# Patient Record
Sex: Female | Born: 1940 | Race: White | Hispanic: No | Smoking: Never smoker
Health system: Southern US, Community
[De-identification: ages and names within clinical notes are randomized; demographics above are authoritative.]

## PROBLEM LIST (undated history)

## (undated) DIAGNOSIS — I319 Disease of pericardium, unspecified: Secondary | ICD-10-CM

## (undated) DIAGNOSIS — I5042 Chronic combined systolic (congestive) and diastolic (congestive) heart failure: Secondary | ICD-10-CM

## (undated) DIAGNOSIS — R079 Chest pain, unspecified: Secondary | ICD-10-CM

## (undated) DIAGNOSIS — E785 Hyperlipidemia, unspecified: Secondary | ICD-10-CM

## (undated) DIAGNOSIS — I2510 Atherosclerotic heart disease of native coronary artery without angina pectoris: Secondary | ICD-10-CM

## (undated) DIAGNOSIS — I219 Acute myocardial infarction, unspecified: Secondary | ICD-10-CM

## (undated) DIAGNOSIS — E119 Type 2 diabetes mellitus without complications: Secondary | ICD-10-CM

## (undated) DIAGNOSIS — K589 Irritable bowel syndrome without diarrhea: Secondary | ICD-10-CM

## (undated) HISTORY — PX: CORONARY ANGIOPLASTY WITH STENT PLACEMENT: SHX49

## (undated) HISTORY — DX: Hyperlipidemia, unspecified: E78.5

## (undated) HISTORY — DX: Chronic combined systolic (congestive) and diastolic (congestive) heart failure: I50.42

## (undated) HISTORY — DX: Irritable bowel syndrome, unspecified: K58.9

## (undated) HISTORY — DX: Type 2 diabetes mellitus without complications: E11.9

---

## 2001-06-09 ENCOUNTER — Encounter: Admission: RE | Admit: 2001-06-09 | Discharge: 2001-08-11 | Payer: Self-pay | Admitting: Family Medicine

## 2002-05-01 ENCOUNTER — Emergency Department (HOSPITAL_COMMUNITY): Admission: EM | Admit: 2002-05-01 | Discharge: 2002-05-01 | Payer: Self-pay | Admitting: Emergency Medicine

## 2002-05-01 ENCOUNTER — Encounter: Payer: Self-pay | Admitting: *Deleted

## 2002-06-01 ENCOUNTER — Ambulatory Visit (HOSPITAL_COMMUNITY): Admission: RE | Admit: 2002-06-01 | Discharge: 2002-06-01 | Payer: Self-pay | Admitting: Family Medicine

## 2002-06-01 ENCOUNTER — Encounter: Payer: Self-pay | Admitting: Family Medicine

## 2002-12-30 ENCOUNTER — Encounter: Payer: Self-pay | Admitting: Family Medicine

## 2002-12-30 ENCOUNTER — Ambulatory Visit (HOSPITAL_COMMUNITY): Admission: RE | Admit: 2002-12-30 | Discharge: 2002-12-30 | Payer: Self-pay | Admitting: Family Medicine

## 2003-05-05 ENCOUNTER — Encounter: Admission: RE | Admit: 2003-05-05 | Discharge: 2003-08-03 | Payer: Self-pay | Admitting: Orthopaedic Surgery

## 2003-08-04 ENCOUNTER — Encounter: Admission: RE | Admit: 2003-08-04 | Discharge: 2003-09-30 | Payer: Self-pay | Admitting: Orthopaedic Surgery

## 2004-11-30 ENCOUNTER — Emergency Department (HOSPITAL_COMMUNITY): Admission: EM | Admit: 2004-11-30 | Discharge: 2004-12-01 | Payer: Self-pay | Admitting: *Deleted

## 2004-12-31 ENCOUNTER — Ambulatory Visit (HOSPITAL_COMMUNITY): Admission: RE | Admit: 2004-12-31 | Discharge: 2004-12-31 | Payer: Self-pay | Admitting: Family Medicine

## 2005-04-24 ENCOUNTER — Emergency Department (HOSPITAL_COMMUNITY): Admission: EM | Admit: 2005-04-24 | Discharge: 2005-04-25 | Payer: Self-pay | Admitting: *Deleted

## 2005-07-09 ENCOUNTER — Ambulatory Visit (HOSPITAL_COMMUNITY): Admission: RE | Admit: 2005-07-09 | Discharge: 2005-07-09 | Payer: Self-pay | Admitting: Family Medicine

## 2005-11-12 ENCOUNTER — Ambulatory Visit (HOSPITAL_COMMUNITY): Admission: RE | Admit: 2005-11-12 | Discharge: 2005-11-12 | Payer: Self-pay | Admitting: Family Medicine

## 2006-05-15 ENCOUNTER — Ambulatory Visit (HOSPITAL_COMMUNITY): Admission: RE | Admit: 2006-05-15 | Discharge: 2006-05-15 | Payer: Self-pay | Admitting: Family Medicine

## 2006-09-03 ENCOUNTER — Ambulatory Visit (HOSPITAL_COMMUNITY): Admission: RE | Admit: 2006-09-03 | Discharge: 2006-09-03 | Payer: Self-pay | Admitting: Family Medicine

## 2008-12-14 ENCOUNTER — Ambulatory Visit (HOSPITAL_COMMUNITY): Admission: RE | Admit: 2008-12-14 | Discharge: 2008-12-14 | Payer: Self-pay | Admitting: Family Medicine

## 2010-12-02 ENCOUNTER — Encounter: Payer: Self-pay | Admitting: Family Medicine

## 2012-01-09 DIAGNOSIS — E119 Type 2 diabetes mellitus without complications: Secondary | ICD-10-CM | POA: Diagnosis not present

## 2012-01-09 DIAGNOSIS — N390 Urinary tract infection, site not specified: Secondary | ICD-10-CM | POA: Diagnosis not present

## 2012-01-09 DIAGNOSIS — M545 Low back pain: Secondary | ICD-10-CM | POA: Diagnosis not present

## 2012-03-25 DIAGNOSIS — Z85828 Personal history of other malignant neoplasm of skin: Secondary | ICD-10-CM | POA: Diagnosis not present

## 2012-03-25 DIAGNOSIS — D239 Other benign neoplasm of skin, unspecified: Secondary | ICD-10-CM | POA: Diagnosis not present

## 2012-03-25 DIAGNOSIS — C44319 Basal cell carcinoma of skin of other parts of face: Secondary | ICD-10-CM | POA: Diagnosis not present

## 2012-03-25 DIAGNOSIS — L570 Actinic keratosis: Secondary | ICD-10-CM | POA: Diagnosis not present

## 2012-04-20 DIAGNOSIS — C44319 Basal cell carcinoma of skin of other parts of face: Secondary | ICD-10-CM | POA: Diagnosis not present

## 2012-05-06 DIAGNOSIS — I10 Essential (primary) hypertension: Secondary | ICD-10-CM | POA: Diagnosis not present

## 2012-05-06 DIAGNOSIS — E785 Hyperlipidemia, unspecified: Secondary | ICD-10-CM | POA: Diagnosis not present

## 2012-05-06 DIAGNOSIS — E119 Type 2 diabetes mellitus without complications: Secondary | ICD-10-CM | POA: Diagnosis not present

## 2012-05-18 DIAGNOSIS — Z1231 Encounter for screening mammogram for malignant neoplasm of breast: Secondary | ICD-10-CM | POA: Diagnosis not present

## 2012-06-03 DIAGNOSIS — A088 Other specified intestinal infections: Secondary | ICD-10-CM | POA: Diagnosis not present

## 2012-08-21 DIAGNOSIS — Z79899 Other long term (current) drug therapy: Secondary | ICD-10-CM | POA: Diagnosis not present

## 2012-08-21 DIAGNOSIS — E782 Mixed hyperlipidemia: Secondary | ICD-10-CM | POA: Diagnosis not present

## 2012-08-24 DIAGNOSIS — E785 Hyperlipidemia, unspecified: Secondary | ICD-10-CM | POA: Diagnosis not present

## 2012-08-24 DIAGNOSIS — E119 Type 2 diabetes mellitus without complications: Secondary | ICD-10-CM | POA: Diagnosis not present

## 2012-08-24 DIAGNOSIS — I10 Essential (primary) hypertension: Secondary | ICD-10-CM | POA: Diagnosis not present

## 2012-08-24 DIAGNOSIS — Z23 Encounter for immunization: Secondary | ICD-10-CM | POA: Diagnosis not present

## 2012-10-20 DIAGNOSIS — D485 Neoplasm of uncertain behavior of skin: Secondary | ICD-10-CM | POA: Diagnosis not present

## 2012-10-20 DIAGNOSIS — L280 Lichen simplex chronicus: Secondary | ICD-10-CM | POA: Diagnosis not present

## 2012-10-20 DIAGNOSIS — Z85828 Personal history of other malignant neoplasm of skin: Secondary | ICD-10-CM | POA: Diagnosis not present

## 2012-11-24 DIAGNOSIS — Z79899 Other long term (current) drug therapy: Secondary | ICD-10-CM | POA: Diagnosis not present

## 2012-11-24 DIAGNOSIS — E782 Mixed hyperlipidemia: Secondary | ICD-10-CM | POA: Diagnosis not present

## 2012-12-15 DIAGNOSIS — E785 Hyperlipidemia, unspecified: Secondary | ICD-10-CM | POA: Diagnosis not present

## 2012-12-15 DIAGNOSIS — I10 Essential (primary) hypertension: Secondary | ICD-10-CM | POA: Diagnosis not present

## 2012-12-15 DIAGNOSIS — E119 Type 2 diabetes mellitus without complications: Secondary | ICD-10-CM | POA: Diagnosis not present

## 2013-02-04 ENCOUNTER — Telehealth: Payer: Self-pay | Admitting: Family Medicine

## 2013-02-04 ENCOUNTER — Other Ambulatory Visit: Payer: Self-pay

## 2013-02-04 DIAGNOSIS — J329 Chronic sinusitis, unspecified: Secondary | ICD-10-CM

## 2013-02-04 MED ORDER — AMOXICILLIN 500 MG PO TABS: 500.0000 mg | ORAL_TABLET | Freq: Three times a day (TID) | ORAL | Status: DC

## 2013-02-04 NOTE — Telephone Encounter (Signed)
E-prescribed to [facility] drug amox 500 tid for ten days. Patient notified.

## 2013-02-04 NOTE — Telephone Encounter (Signed)
Refill amox 500 tid for ten days

## 2013-02-04 NOTE — Telephone Encounter (Signed)
Pt finished her antibiotic from her last visit, she is still having ear pain, tooth ache left side, sinus pressure, rattling in chest, scratchy throat can she get another round of antibiotic or does she need to come back in?  she

## 2013-02-05 ENCOUNTER — Telehealth: Payer: Self-pay | Admitting: *Deleted

## 2013-02-05 DIAGNOSIS — J329 Chronic sinusitis, unspecified: Secondary | ICD-10-CM

## 2013-02-05 MED ORDER — HYDROCODONE-ACETAMINOPHEN 5-325 MG PO TABS: 1.0000 | ORAL_TABLET | Freq: Four times a day (QID) | ORAL | Status: DC | PRN

## 2013-02-05 MED ORDER — METOPROLOL SUCCINATE ER 200 MG PO TB24: 200.0000 mg | ORAL_TABLET | Freq: Every day | ORAL | Status: DC

## 2013-02-05 NOTE — Telephone Encounter (Signed)
[facility] in [facility]

## 2013-04-08 ENCOUNTER — Encounter: Payer: Self-pay | Admitting: *Deleted

## 2013-04-19 ENCOUNTER — Ambulatory Visit (INDEPENDENT_AMBULATORY_CARE_PROVIDER_SITE_OTHER): Payer: Medicare Other | Admitting: Family Medicine

## 2013-04-19 ENCOUNTER — Encounter: Payer: Self-pay | Admitting: Family Medicine

## 2013-04-19 VITALS — BP 122/70 | HR 70 | Wt 163.0 lb

## 2013-04-19 DIAGNOSIS — I10 Essential (primary) hypertension: Secondary | ICD-10-CM | POA: Diagnosis not present

## 2013-04-19 DIAGNOSIS — E119 Type 2 diabetes mellitus without complications: Secondary | ICD-10-CM | POA: Diagnosis not present

## 2013-04-19 DIAGNOSIS — G8929 Other chronic pain: Secondary | ICD-10-CM | POA: Insufficient documentation

## 2013-04-19 DIAGNOSIS — M25559 Pain in unspecified hip: Secondary | ICD-10-CM | POA: Diagnosis not present

## 2013-04-19 DIAGNOSIS — IMO0001 Reserved for inherently not codable concepts without codable children: Secondary | ICD-10-CM

## 2013-04-19 DIAGNOSIS — E785 Hyperlipidemia, unspecified: Secondary | ICD-10-CM | POA: Diagnosis not present

## 2013-04-19 DIAGNOSIS — E1122 Type 2 diabetes mellitus with diabetic chronic kidney disease: Secondary | ICD-10-CM | POA: Insufficient documentation

## 2013-04-19 LAB — POCT GLYCOSYLATED HEMOGLOBIN (HGB A1C): Hemoglobin A1C: 6.7

## 2013-04-19 NOTE — Progress Notes (Signed)
  Subjective:    Patient ID: Elizabeth Aguilar, female    DOB: Oct 29, 1941, 72 y.o.   MRN: [id number]  Diabetes She presents for her follow-up diabetic visit. She has type 2 diabetes mellitus. Pertinent negatives for diabetes include no chest pain and no fatigue.  Patient denies chest tightness pressure pain shortness breath nausea vomiting diarrhea. Has history diabetes did not tolerate Glucotrol also history of hypertension allergies arthralgias anxiety and hyperlipidemia. Safety measures were reviewed dietary measures reviewed family history reviewed social doesn't smoke   Review of Systems  Constitutional: Negative for fever and fatigue.  HENT: Positive for congestion.   Respiratory: Negative for shortness of breath.   Cardiovascular: Negative for chest pain.  Gastrointestinal: Negative for abdominal pain.       Objective:   Physical Exam  Constitutional: She appears well-developed.  HENT:  Head: Normocephalic.  Right Ear: External ear normal.  Left Ear: External ear normal.  Cardiovascular: Normal rate and regular rhythm.   Pulmonary/Chest: Effort normal and breath sounds normal. She has no wheezes.  Abdominal: Soft.  Lymphadenopathy:    She has no cervical adenopathy.          Assessment & Plan:  #1-Hypertension fair control continue current measures. Diabetes fairly good control continue Glucophage and diet #3 arthralgias Mobic as directed. Anxiety issues Xanax on a when necessary basis #5 hyperlipidemia continue pravastatin check lab work.

## 2013-04-20 DIAGNOSIS — L909 Atrophic disorder of skin, unspecified: Secondary | ICD-10-CM | POA: Diagnosis not present

## 2013-04-20 DIAGNOSIS — D239 Other benign neoplasm of skin, unspecified: Secondary | ICD-10-CM | POA: Diagnosis not present

## 2013-04-20 DIAGNOSIS — L570 Actinic keratosis: Secondary | ICD-10-CM | POA: Diagnosis not present

## 2013-04-20 DIAGNOSIS — D1801 Hemangioma of skin and subcutaneous tissue: Secondary | ICD-10-CM | POA: Diagnosis not present

## 2013-04-20 DIAGNOSIS — D485 Neoplasm of uncertain behavior of skin: Secondary | ICD-10-CM | POA: Diagnosis not present

## 2013-04-20 DIAGNOSIS — D2330 Other benign neoplasm of skin of unspecified part of face: Secondary | ICD-10-CM | POA: Diagnosis not present

## 2013-04-20 DIAGNOSIS — Z85828 Personal history of other malignant neoplasm of skin: Secondary | ICD-10-CM | POA: Diagnosis not present

## 2013-05-05 ENCOUNTER — Encounter: Payer: Self-pay | Admitting: Family Medicine

## 2013-05-05 DIAGNOSIS — E785 Hyperlipidemia, unspecified: Secondary | ICD-10-CM | POA: Diagnosis not present

## 2013-05-05 DIAGNOSIS — E119 Type 2 diabetes mellitus without complications: Secondary | ICD-10-CM | POA: Diagnosis not present

## 2013-05-05 DIAGNOSIS — M25559 Pain in unspecified hip: Secondary | ICD-10-CM | POA: Diagnosis not present

## 2013-05-05 LAB — LIPID PANEL
Cholesterol: 147 mg/dL (ref 0–200)
LDL Cholesterol: 72 mg/dL (ref 0–99)
Total CHOL/HDL Ratio: 3.5 Ratio

## 2013-05-05 LAB — BASIC METABOLIC PANEL
BUN: 13 mg/dL (ref 6–23)
Calcium: 9 mg/dL (ref 8.4–10.5)
Creat: 1.01 mg/dL (ref 0.50–1.10)
Glucose, Bld: 104 mg/dL — ABNORMAL HIGH (ref 70–99)
Potassium: 4 mEq/L (ref 3.5–5.3)
Sodium: 142 mEq/L (ref 135–145)

## 2013-05-05 LAB — MICROALBUMIN, URINE: Microalb, Ur: 0.5 mg/dL (ref 0.00–1.89)

## 2013-05-07 ENCOUNTER — Other Ambulatory Visit: Payer: Self-pay | Admitting: *Deleted

## 2013-05-07 MED ORDER — ALPRAZOLAM 0.5 MG PO TABS: ORAL_TABLET | ORAL | Status: DC

## 2013-06-07 ENCOUNTER — Other Ambulatory Visit: Payer: Self-pay | Admitting: *Deleted

## 2013-06-07 MED ORDER — HYDROCODONE-ACETAMINOPHEN 5-325 MG PO TABS: 1.0000 | ORAL_TABLET | Freq: Four times a day (QID) | ORAL | Status: DC | PRN

## 2013-08-06 ENCOUNTER — Other Ambulatory Visit: Payer: Self-pay | Admitting: *Deleted

## 2013-08-06 MED ORDER — METOPROLOL SUCCINATE ER 200 MG PO TB24: 200.0000 mg | ORAL_TABLET | Freq: Every day | ORAL | Status: DC

## 2013-08-06 MED ORDER — HYDROCHLOROTHIAZIDE 25 MG PO TABS: 12.5000 mg | ORAL_TABLET | Freq: Every day | ORAL | Status: DC

## 2013-08-06 MED ORDER — MELOXICAM 7.5 MG PO TABS: 7.5000 mg | ORAL_TABLET | Freq: Every day | ORAL | Status: DC

## 2013-08-06 MED ORDER — LISINOPRIL 5 MG PO TABS: 5.0000 mg | ORAL_TABLET | Freq: Every day | ORAL | Status: DC

## 2013-08-06 MED ORDER — ESOMEPRAZOLE MAGNESIUM 40 MG PO CPDR: 40.0000 mg | DELAYED_RELEASE_CAPSULE | Freq: Every day | ORAL | Status: DC

## 2013-08-06 MED ORDER — LORATADINE 10 MG PO TABS: 10.0000 mg | ORAL_TABLET | Freq: Every day | ORAL | Status: DC

## 2013-08-06 MED ORDER — METFORMIN HCL 500 MG PO TABS: 500.0000 mg | ORAL_TABLET | Freq: Two times a day (BID) | ORAL | Status: DC

## 2013-08-11 ENCOUNTER — Other Ambulatory Visit: Payer: Self-pay | Admitting: *Deleted

## 2013-08-11 MED ORDER — CYCLOBENZAPRINE HCL 10 MG PO TABS: 10.0000 mg | ORAL_TABLET | Freq: Every evening | ORAL | Status: DC | PRN

## 2013-08-12 ENCOUNTER — Encounter: Payer: Self-pay | Admitting: Family Medicine

## 2013-08-12 ENCOUNTER — Ambulatory Visit (INDEPENDENT_AMBULATORY_CARE_PROVIDER_SITE_OTHER): Payer: Medicare Other | Admitting: Family Medicine

## 2013-08-12 VITALS — BP 108/72 | Ht 65.0 in | Wt 161.0 lb

## 2013-08-12 DIAGNOSIS — N390 Urinary tract infection, site not specified: Secondary | ICD-10-CM | POA: Diagnosis not present

## 2013-08-12 DIAGNOSIS — M549 Dorsalgia, unspecified: Secondary | ICD-10-CM

## 2013-08-12 DIAGNOSIS — E119 Type 2 diabetes mellitus without complications: Secondary | ICD-10-CM | POA: Diagnosis not present

## 2013-08-12 DIAGNOSIS — G8929 Other chronic pain: Secondary | ICD-10-CM

## 2013-08-12 DIAGNOSIS — I10 Essential (primary) hypertension: Secondary | ICD-10-CM

## 2013-08-12 DIAGNOSIS — E785 Hyperlipidemia, unspecified: Secondary | ICD-10-CM

## 2013-08-12 DIAGNOSIS — Z23 Encounter for immunization: Secondary | ICD-10-CM | POA: Diagnosis not present

## 2013-08-12 LAB — POCT URINALYSIS DIPSTICK
Spec Grav, UA: <=1.005
pH, UA: 5

## 2013-08-12 MED ORDER — PRAVASTATIN SODIUM 80 MG PO TABS: 80.0000 mg | ORAL_TABLET | Freq: Every day | ORAL | Status: DC

## 2013-08-12 MED ORDER — ESOMEPRAZOLE MAGNESIUM 40 MG PO CPDR: 40.0000 mg | DELAYED_RELEASE_CAPSULE | Freq: Every day | ORAL | Status: DC

## 2013-08-12 MED ORDER — FLUTICASONE PROPIONATE 50 MCG/ACT NA SUSP: 2.0000 | Freq: Every day | NASAL | Status: DC

## 2013-08-12 MED ORDER — METFORMIN HCL 500 MG PO TABS: 500.0000 mg | ORAL_TABLET | Freq: Two times a day (BID) | ORAL | Status: DC

## 2013-08-12 MED ORDER — HYDROCODONE-ACETAMINOPHEN 5-325 MG PO TABS: 1.0000 | ORAL_TABLET | Freq: Four times a day (QID) | ORAL | Status: DC | PRN

## 2013-08-12 MED ORDER — CYCLOBENZAPRINE HCL 10 MG PO TABS: 10.0000 mg | ORAL_TABLET | Freq: Every evening | ORAL | Status: DC | PRN

## 2013-08-12 MED ORDER — KETOROLAC TROMETHAMINE 0.4 % OP SOLN: 1.0000 [drp] | Freq: Four times a day (QID) | OPHTHALMIC | Status: DC

## 2013-08-12 MED ORDER — ALPRAZOLAM 0.5 MG PO TABS: ORAL_TABLET | ORAL | Status: DC

## 2013-08-12 MED ORDER — HYDROCHLOROTHIAZIDE 25 MG PO TABS: 12.5000 mg | ORAL_TABLET | Freq: Every day | ORAL | Status: DC

## 2013-08-12 NOTE — Addendum Note (Signed)
Addended by: Dereck Ligas on: 08/12/2013 03:40 PM   Modules accepted: Orders

## 2013-08-12 NOTE — Progress Notes (Signed)
  Subjective:    Patient ID: Elizabeth Aguilar, female    DOB: 1941-04-11, 72 y.o.   MRN: [id number]  Diabetes She presents for her follow-up diabetic visit. She has type 2 diabetes mellitus. Her disease course has been stable. There are no hypoglycemic associated symptoms. There are no diabetic associated symptoms. There are no hypoglycemic complications. Symptoms are stable. There are no diabetic complications. There are no known risk factors for coronary artery disease. Current diabetic treatment includes oral agent (monotherapy). She is compliant with treatment all of the time.  Urinary Tract Infection  This is a new problem. The current episode started 1 to 4 weeks ago. The problem occurs intermittently. The problem has been unchanged. The quality of the pain is described as burning. The pain is mild. There has been no fever. She has tried nothing for the symptoms. The treatment provided no relief.   States sugars overall doing fairly well denies any low spells relates compliance with medicine  Has chronic pain in the low back as well as her knees she states she takes hydrocodone anywhere from one to 3 times per day. Most of the time it's only one time a day. She denies abusing it. She keeps her medicine in the same spot. This patient was seen today for chronic pain  The medication list was reviewed and updated.  Discussion was held with the patient regarding compliance with pain medication. The patient was advised the importance of maintaining medication and not using illegal substances with these. The patient was educated that we can provide 3 monthly scripts for their medication, it is their responsibility to follow the instructions. Discussion was held with the patient to make sure they're not having significant side effects. Patient is aware that pain medications are meant to minimize the severity of the pain to allow their pain levels to improve to allow for better function. They are aware of  that pain medications cannot totally remove their pain.   Patient also uses nerve pills denies abusing it. Helps her when she's stressed out. She needs refills on all of her medicines today. Family history reviewed. Social history does not smoke   Review of Systems No excessive thirst having some frequency in urination no vomiting no diarrhea.    Objective:   Physical Exam Lungs are clear heart is regular neck no masses eardrums some wax otherwise okay extremities no edema pulses are slightly diminished somewhat dry feet diabetic foot exam was done bunion on the left side callus on the right side would benefit from diabetic shoes.      Assessment & Plan:

## 2013-08-13 LAB — URINE CULTURE
Colony Count: NO GROWTH
Organism ID, Bacteria: NO GROWTH

## 2013-09-02 ENCOUNTER — Encounter: Payer: Self-pay | Admitting: Family Medicine

## 2013-09-02 ENCOUNTER — Ambulatory Visit (INDEPENDENT_AMBULATORY_CARE_PROVIDER_SITE_OTHER): Payer: Medicare Other | Admitting: Family Medicine

## 2013-09-02 VITALS — BP 112/70 | Temp 98.1°F | Ht 65.0 in | Wt 159.6 lb

## 2013-09-02 DIAGNOSIS — L259 Unspecified contact dermatitis, unspecified cause: Secondary | ICD-10-CM | POA: Diagnosis not present

## 2013-09-02 MED ORDER — MOMETASONE FUROATE 0.1 % EX CREA: TOPICAL_CREAM | CUTANEOUS | Status: DC

## 2013-09-02 NOTE — Progress Notes (Signed)
  Subjective:    Patient ID: Elizabeth Aguilar, female    DOB: 08-Nov-1941, 72 y.o.   MRN: [id number]  Rash This is a new problem. The current episode started in the past 7 days. The affected locations include the right wrist, left arm, right arm and left wrist. The rash is characterized by dryness, itchiness and redness. She was exposed to nothing. Past treatments include nothing.   PMH benign No new meds no new detergents or soaps.   Review of Systems  Skin: Positive for rash.   she denies fever cough runny nose     Objective:   Physical Exam Lungs are clear hearts regular no edema in the legs she has a non-distinct rash in the folds of the elbow plus also the right and left breast your fremitus slight central clearing does not appear to be target lesion. Does not appear to be erythema multiforme. Could be eczema.       Assessment & Plan:  Probable eczema/contact dermatitis steroid cream as directed followup if ongoing troubles keep regular followups

## 2013-09-07 ENCOUNTER — Other Ambulatory Visit: Payer: Self-pay | Admitting: *Deleted

## 2013-09-07 MED ORDER — METOPROLOL SUCCINATE ER 200 MG PO TB24: 200.0000 mg | ORAL_TABLET | Freq: Every day | ORAL | Status: DC

## 2013-09-07 MED ORDER — LISINOPRIL 5 MG PO TABS: 5.0000 mg | ORAL_TABLET | Freq: Every day | ORAL | Status: DC

## 2013-09-07 MED ORDER — LORATADINE 10 MG PO TABS: 10.0000 mg | ORAL_TABLET | Freq: Every day | ORAL | Status: DC

## 2013-09-10 ENCOUNTER — Telehealth: Payer: Self-pay | Admitting: Family Medicine

## 2013-09-10 NOTE — Telephone Encounter (Signed)
Prescription given to Elizabeth Aguilar for Diabetic Shoes.  DX: Type II Diabetic Neuropathy

## 2013-09-15 ENCOUNTER — Other Ambulatory Visit: Payer: Self-pay

## 2013-09-15 MED ORDER — MELOXICAM 7.5 MG PO TABS: 7.5000 mg | ORAL_TABLET | Freq: Every day | ORAL | Status: DC

## 2013-10-04 ENCOUNTER — Other Ambulatory Visit: Payer: Self-pay | Admitting: *Deleted

## 2013-10-04 MED ORDER — MOMETASONE FUROATE 0.1 % EX CREA: TOPICAL_CREAM | CUTANEOUS | Status: DC

## 2013-10-21 DIAGNOSIS — D2330 Other benign neoplasm of skin of unspecified part of face: Secondary | ICD-10-CM | POA: Diagnosis not present

## 2013-10-21 DIAGNOSIS — L259 Unspecified contact dermatitis, unspecified cause: Secondary | ICD-10-CM | POA: Diagnosis not present

## 2013-10-21 DIAGNOSIS — Z85828 Personal history of other malignant neoplasm of skin: Secondary | ICD-10-CM | POA: Diagnosis not present

## 2013-10-21 DIAGNOSIS — L570 Actinic keratosis: Secondary | ICD-10-CM | POA: Diagnosis not present

## 2013-11-16 ENCOUNTER — Ambulatory Visit: Payer: Medicare Other | Admitting: Family Medicine

## 2013-11-17 ENCOUNTER — Ambulatory Visit (INDEPENDENT_AMBULATORY_CARE_PROVIDER_SITE_OTHER): Payer: Medicare Other | Admitting: Family Medicine

## 2013-11-17 ENCOUNTER — Encounter: Payer: Self-pay | Admitting: Family Medicine

## 2013-11-17 VITALS — BP 122/68 | Ht 63.25 in | Wt 158.0 lb

## 2013-11-17 DIAGNOSIS — M549 Dorsalgia, unspecified: Secondary | ICD-10-CM | POA: Diagnosis not present

## 2013-11-17 DIAGNOSIS — E119 Type 2 diabetes mellitus without complications: Secondary | ICD-10-CM

## 2013-11-17 DIAGNOSIS — E785 Hyperlipidemia, unspecified: Secondary | ICD-10-CM

## 2013-11-17 DIAGNOSIS — G8929 Other chronic pain: Secondary | ICD-10-CM

## 2013-11-17 DIAGNOSIS — I10 Essential (primary) hypertension: Secondary | ICD-10-CM | POA: Diagnosis not present

## 2013-11-17 LAB — POCT GLYCOSYLATED HEMOGLOBIN (HGB A1C): Hemoglobin A1C: 6.2

## 2013-11-17 MED ORDER — AMOXICILLIN 500 MG PO TABS: 500.0000 mg | ORAL_TABLET | Freq: Three times a day (TID) | ORAL | Status: DC

## 2013-11-17 MED ORDER — HYDROCODONE-ACETAMINOPHEN 5-325 MG PO TABS: ORAL_TABLET | ORAL | Status: DC

## 2013-11-17 NOTE — Progress Notes (Signed)
   Subjective:    Patient ID: Elizabeth Aguilar, female    DOB: 04-Oct-1941, 73 y.o.   MRN: [id number]  HPIDiabetic check up. No concerns. Checks blood sugar about once per day. A1C today 6.2. #1 diabetes under good control watching diet is trying to stay active #2 chronic pain both in the back in the knees pain medication helps she uses it no more than 3 times per day denies abusing it #3 she does try to eat healthy and take her medicine for her blood pressure. Patient does not smoke PMH reviewed  Review of Systems  Constitutional: Negative for activity change, appetite change and fatigue.  Endocrine: Negative for polydipsia and polyphagia.  Genitourinary: Negative for frequency.  Neurological: Negative for weakness.  Psychiatric/Behavioral: Negative for confusion.       Objective:   Physical Exam Lungs are clear hearts regular abdomen soft extremities no edema diabetic foot exam normal except for neuropathy       Assessment & Plan:  #1 chronic pain prescriptions written followup again in 3 months #2 diabetes good control continue diet activity and in medication #3 HTN good control continue current measures #4 hyperlipidemia-continue current measures See patient back in 3 months time

## 2013-12-16 ENCOUNTER — Inpatient Hospital Stay (HOSPITAL_COMMUNITY)
Admission: RE | Admit: 2013-12-16 | Discharge: 2013-12-20 | DRG: 247 | Disposition: A | Discharge status: Home / Self Care | Payer: Medicare Other | Source: Ambulatory Visit | Attending: Cardiovascular Disease | Admitting: Cardiovascular Disease

## 2013-12-16 ENCOUNTER — Encounter (HOSPITAL_COMMUNITY)
Admission: RE | Disposition: A | Discharge status: Home / Self Care | Payer: Medicare Other | Source: Ambulatory Visit | Attending: Cardiovascular Disease

## 2013-12-16 DIAGNOSIS — I2589 Other forms of chronic ischemic heart disease: Secondary | ICD-10-CM | POA: Diagnosis not present

## 2013-12-16 DIAGNOSIS — E1122 Type 2 diabetes mellitus with diabetic chronic kidney disease: Secondary | ICD-10-CM | POA: Diagnosis present

## 2013-12-16 DIAGNOSIS — E876 Hypokalemia: Secondary | ICD-10-CM | POA: Diagnosis present

## 2013-12-16 DIAGNOSIS — R079 Chest pain, unspecified: Secondary | ICD-10-CM | POA: Diagnosis not present

## 2013-12-16 DIAGNOSIS — I2510 Atherosclerotic heart disease of native coronary artery without angina pectoris: Secondary | ICD-10-CM | POA: Diagnosis not present

## 2013-12-16 DIAGNOSIS — I2109 ST elevation (STEMI) myocardial infarction involving other coronary artery of anterior wall: Secondary | ICD-10-CM | POA: Diagnosis not present

## 2013-12-16 DIAGNOSIS — G8929 Other chronic pain: Secondary | ICD-10-CM

## 2013-12-16 DIAGNOSIS — I10 Essential (primary) hypertension: Secondary | ICD-10-CM | POA: Diagnosis present

## 2013-12-16 DIAGNOSIS — M25559 Pain in unspecified hip: Secondary | ICD-10-CM | POA: Diagnosis not present

## 2013-12-16 DIAGNOSIS — M25562 Pain in left knee: Secondary | ICD-10-CM

## 2013-12-16 DIAGNOSIS — E785 Hyperlipidemia, unspecified: Secondary | ICD-10-CM | POA: Diagnosis not present

## 2013-12-16 DIAGNOSIS — I472 Ventricular tachycardia, unspecified: Secondary | ICD-10-CM | POA: Diagnosis not present

## 2013-12-16 DIAGNOSIS — M25552 Pain in left hip: Secondary | ICD-10-CM

## 2013-12-16 DIAGNOSIS — R002 Palpitations: Secondary | ICD-10-CM | POA: Diagnosis present

## 2013-12-16 DIAGNOSIS — I252 Old myocardial infarction: Secondary | ICD-10-CM | POA: Diagnosis not present

## 2013-12-16 DIAGNOSIS — E119 Type 2 diabetes mellitus without complications: Secondary | ICD-10-CM | POA: Diagnosis not present

## 2013-12-16 DIAGNOSIS — Z79899 Other long term (current) drug therapy: Secondary | ICD-10-CM

## 2013-12-16 DIAGNOSIS — I2102 ST elevation (STEMI) myocardial infarction involving left anterior descending coronary artery: Secondary | ICD-10-CM | POA: Diagnosis present

## 2013-12-16 DIAGNOSIS — I4729 Other ventricular tachycardia: Secondary | ICD-10-CM | POA: Diagnosis present

## 2013-12-16 DIAGNOSIS — M25551 Pain in right hip: Secondary | ICD-10-CM

## 2013-12-16 DIAGNOSIS — K589 Irritable bowel syndrome without diarrhea: Secondary | ICD-10-CM | POA: Diagnosis present

## 2013-12-16 DIAGNOSIS — I255 Ischemic cardiomyopathy: Secondary | ICD-10-CM

## 2013-12-16 DIAGNOSIS — M25561 Pain in right knee: Secondary | ICD-10-CM

## 2013-12-16 DIAGNOSIS — N182 Chronic kidney disease, stage 2 (mild): Secondary | ICD-10-CM | POA: Diagnosis present

## 2013-12-16 DIAGNOSIS — Z9861 Coronary angioplasty status: Secondary | ICD-10-CM

## 2013-12-16 DIAGNOSIS — I059 Rheumatic mitral valve disease, unspecified: Secondary | ICD-10-CM | POA: Diagnosis not present

## 2013-12-16 HISTORY — PX: LEFT HEART CATHETERIZATION WITH CORONARY ANGIOGRAM: SHX5451

## 2013-12-16 LAB — CBC WITH DIFFERENTIAL/PLATELET
Basophils Absolute: 0 10*3/uL (ref 0.0–0.1)
Basophils Relative: 0 % (ref 0–1)
Eosinophils Absolute: 0.2 10*3/uL (ref 0.0–0.7)
Eosinophils Relative: 2 % (ref 0–5)
HCT: 33.1 % — ABNORMAL LOW (ref 36.0–46.0)
HEMOGLOBIN: 11.1 g/dL — AB (ref 12.0–15.0)
LYMPHS PCT: 16 % (ref 12–46)
Lymphs Abs: 2 10*3/uL (ref 0.7–4.0)
MCH: 30.4 pg (ref 26.0–34.0)
MCHC: 33.5 g/dL (ref 30.0–36.0)
MCV: 90.7 fL (ref 78.0–100.0)
MONOS PCT: 5 % (ref 3–12)
Monocytes Absolute: 0.6 10*3/uL (ref 0.1–1.0)
Neutro Abs: 9.8 10*3/uL — ABNORMAL HIGH (ref 1.7–7.7)
Neutrophils Relative %: 78 % — ABNORMAL HIGH (ref 43–77)
PLATELETS: 302 10*3/uL (ref 150–400)
RBC: 3.65 MIL/uL — AB (ref 3.87–5.11)
RDW: 13.6 % (ref 11.5–15.5)
WBC: 12.5 10*3/uL — AB (ref 4.0–10.5)

## 2013-12-16 LAB — LIPID PANEL
Cholesterol: 153 mg/dL (ref 0–200)
HDL: 38 mg/dL — ABNORMAL LOW (ref >39)
LDL CALC: 75 mg/dL (ref 0–99)
Total CHOL/HDL Ratio: 4 RATIO
Triglycerides: 199 mg/dL — ABNORMAL HIGH (ref <150)
VLDL: 40 mg/dL (ref 0–40)

## 2013-12-16 LAB — COMPREHENSIVE METABOLIC PANEL
ALK PHOS: 96 U/L (ref 39–117)
ALT: 12 U/L (ref 0–35)
AST: 25 U/L (ref 0–37)
Albumin: 3.1 g/dL — ABNORMAL LOW (ref 3.5–5.2)
BUN: 21 mg/dL (ref 6–23)
CO2: 23 mEq/L (ref 19–32)
Calcium: 8.7 mg/dL (ref 8.4–10.5)
Chloride: 101 mEq/L (ref 96–112)
Creatinine, Ser: 0.95 mg/dL (ref 0.50–1.10)
GFR calc non Af Amer: 58 mL/min — ABNORMAL LOW (ref >90)
GFR, EST AFRICAN AMERICAN: 67 mL/min — AB (ref >90)
Glucose, Bld: 197 mg/dL — ABNORMAL HIGH (ref 70–99)
POTASSIUM: 3.5 meq/L — AB (ref 3.7–5.3)
SODIUM: 141 meq/L (ref 137–147)
Total Bilirubin: 0.2 mg/dL — ABNORMAL LOW (ref 0.3–1.2)
Total Protein: 6.4 g/dL (ref 6.0–8.3)

## 2013-12-16 LAB — POCT I-STAT, CHEM 8
BUN: 20 mg/dL (ref 6–23)
CALCIUM ION: 1.27 mmol/L (ref 1.13–1.30)
CREATININE: 1 mg/dL (ref 0.50–1.10)
Chloride: 102 mEq/L (ref 96–112)
Glucose, Bld: 196 mg/dL — ABNORMAL HIGH (ref 70–99)
HCT: 37 % (ref 36.0–46.0)
Hemoglobin: 12.6 g/dL (ref 12.0–15.0)
Potassium: 3.4 mEq/L — ABNORMAL LOW (ref 3.7–5.3)
Sodium: 142 mEq/L (ref 137–147)
TCO2: 24 mmol/L (ref 0–100)

## 2013-12-16 LAB — MRSA PCR SCREENING: MRSA BY PCR: NEGATIVE

## 2013-12-16 LAB — CK TOTAL AND CKMB (NOT AT ARMC)
CK, MB: 11.1 ng/mL — AB (ref 0.3–4.0)
Relative Index: 9.9 — ABNORMAL HIGH (ref 0.0–2.5)
Total CK: 112 U/L (ref 7–177)

## 2013-12-16 LAB — APTT: APTT: 26 s (ref 24–37)

## 2013-12-16 LAB — POCT ACTIVATED CLOTTING TIME: ACTIVATED CLOTTING TIME: 492 s

## 2013-12-16 LAB — PROTIME-INR
INR: 0.96 (ref 0.00–1.49)
Prothrombin Time: 12.6 seconds (ref 11.6–15.2)

## 2013-12-16 LAB — TROPONIN I
Troponin I: 2.94 ng/mL (ref <0.30)
Troponin I: 10.91 ng/mL (ref <0.30)
Troponin I: <0.3 ng/mL (ref <0.30)

## 2013-12-16 LAB — HEMOGLOBIN A1C
Hgb A1c MFr Bld: 6.5 % — ABNORMAL HIGH (ref <5.7)
Mean Plasma Glucose: 140 mg/dL — ABNORMAL HIGH (ref <117)

## 2013-12-16 LAB — TSH: TSH: 1.878 u[IU]/mL (ref 0.350–4.500)

## 2013-12-16 SURGERY — LEFT HEART CATHETERIZATION WITH CORONARY ANGIOGRAM
Anesthesia: LOCAL

## 2013-12-16 MED ORDER — SODIUM CHLORIDE 0.9 % IV SOLN: 250.0000 mL | INTRAVENOUS | Status: DC | PRN

## 2013-12-16 MED ORDER — MORPHINE SULFATE 2 MG/ML IJ SOLN: 2.0000 mg | INTRAMUSCULAR | Status: DC | PRN

## 2013-12-16 MED ORDER — SODIUM CHLORIDE 0.9 % IJ SOLN
3.0000 mL | Freq: Two times a day (BID) | INTRAMUSCULAR | Status: DC
  Administered 2013-12-16 – 2013-12-17 (×2): 3 mL via INTRAVENOUS

## 2013-12-16 MED ORDER — BIVALIRUDIN 250 MG IV SOLR
INTRAVENOUS | Status: AC
  Filled 2013-12-16: qty 250

## 2013-12-16 MED ORDER — PRAVASTATIN SODIUM 40 MG PO TABS
80.0000 mg | ORAL_TABLET | Freq: Every day | ORAL | Status: DC
  Administered 2013-12-16 – 2013-12-17 (×2): 80 mg via ORAL
  Filled 2013-12-16 (×3): qty 2

## 2013-12-16 MED ORDER — HEPARIN (PORCINE) IN NACL 2-0.9 UNIT/ML-% IJ SOLN
INTRAMUSCULAR | Status: AC
  Filled 2013-12-16: qty 1000

## 2013-12-16 MED ORDER — NITROGLYCERIN 0.4 MG SL SUBL: 0.4000 mg | SUBLINGUAL_TABLET | SUBLINGUAL | Status: DC | PRN

## 2013-12-16 MED ORDER — ACETAMINOPHEN 325 MG PO TABS
650.0000 mg | ORAL_TABLET | ORAL | Status: DC | PRN
  Administered 2013-12-16 – 2013-12-18 (×5): 650 mg via ORAL
  Filled 2013-12-16 (×6): qty 2

## 2013-12-16 MED ORDER — ALPRAZOLAM 0.5 MG PO TABS
0.5000 mg | ORAL_TABLET | Freq: Two times a day (BID) | ORAL | Status: DC | PRN
  Administered 2013-12-17 – 2013-12-19 (×4): 0.5 mg via ORAL
  Filled 2013-12-16 (×4): qty 1

## 2013-12-16 MED ORDER — MIDAZOLAM HCL 2 MG/2ML IJ SOLN
INTRAMUSCULAR | Status: AC
  Filled 2013-12-16: qty 2

## 2013-12-16 MED ORDER — ASPIRIN EC 81 MG PO TBEC: 81.0000 mg | DELAYED_RELEASE_TABLET | Freq: Every day | ORAL | Status: DC

## 2013-12-16 MED ORDER — TICAGRELOR 90 MG PO TABS
90.0000 mg | ORAL_TABLET | Freq: Two times a day (BID) | ORAL | Status: DC
  Administered 2013-12-16 – 2013-12-20 (×9): 90 mg via ORAL
  Filled 2013-12-16 (×10): qty 1

## 2013-12-16 MED ORDER — LIDOCAINE HCL (PF) 1 % IJ SOLN
INTRAMUSCULAR | Status: AC
  Filled 2013-12-16: qty 30

## 2013-12-16 MED ORDER — METOPROLOL SUCCINATE ER 100 MG PO TB24
200.0000 mg | ORAL_TABLET | Freq: Every day | ORAL | Status: DC
  Filled 2013-12-16: qty 2

## 2013-12-16 MED ORDER — NITROGLYCERIN IN D5W 200-5 MCG/ML-% IV SOLN
INTRAVENOUS | Status: AC
  Filled 2013-12-16: qty 250

## 2013-12-16 MED ORDER — SODIUM CHLORIDE 0.9 % IJ SOLN: 3.0000 mL | INTRAMUSCULAR | Status: DC | PRN

## 2013-12-16 MED ORDER — SODIUM CHLORIDE 0.9 % IV SOLN: INTRAVENOUS | Status: DC

## 2013-12-16 MED ORDER — ONDANSETRON HCL 4 MG/2ML IJ SOLN: 4.0000 mg | Freq: Four times a day (QID) | INTRAMUSCULAR | Status: DC | PRN

## 2013-12-16 MED ORDER — TICAGRELOR 90 MG PO TABS
ORAL_TABLET | ORAL | Status: AC
  Administered 2013-12-16: 90 mg via ORAL
  Filled 2013-12-16: qty 2

## 2013-12-16 MED ORDER — ASPIRIN EC 81 MG PO TBEC
81.0000 mg | DELAYED_RELEASE_TABLET | Freq: Every day | ORAL | Status: DC
  Filled 2013-12-16: qty 1

## 2013-12-16 MED ORDER — LISINOPRIL 5 MG PO TABS
5.0000 mg | ORAL_TABLET | Freq: Every day | ORAL | Status: DC
  Administered 2013-12-17 – 2013-12-20 (×4): 5 mg via ORAL
  Filled 2013-12-16 (×4): qty 1

## 2013-12-16 MED ORDER — HEPARIN (PORCINE) IN NACL 2-0.9 UNIT/ML-% IJ SOLN
INTRAMUSCULAR | Status: AC
  Filled 2013-12-16: qty 500

## 2013-12-16 MED ORDER — LIDOCAINE-EPINEPHRINE 1 %-1:100000 IJ SOLN
INTRAMUSCULAR | Status: AC
  Filled 2013-12-16: qty 1

## 2013-12-16 MED ORDER — PANTOPRAZOLE SODIUM 40 MG PO TBEC
40.0000 mg | DELAYED_RELEASE_TABLET | Freq: Every day | ORAL | Status: DC
  Administered 2013-12-17 – 2013-12-20 (×4): 40 mg via ORAL
  Filled 2013-12-16 (×3): qty 1

## 2013-12-16 MED ORDER — KETOROLAC TROMETHAMINE 0.5 % OP SOLN
1.0000 [drp] | Freq: Four times a day (QID) | OPHTHALMIC | Status: DC
  Administered 2013-12-16 – 2013-12-20 (×14): 1 [drp] via OPHTHALMIC
  Filled 2013-12-16 (×2): qty 3

## 2013-12-16 MED ORDER — ASPIRIN 81 MG PO CHEW
81.0000 mg | CHEWABLE_TABLET | ORAL | Status: AC
  Administered 2013-12-17: 81 mg via ORAL
  Filled 2013-12-16: qty 1

## 2013-12-16 MED ORDER — NITROGLYCERIN IN D5W 200-5 MCG/ML-% IV SOLN: 2.0000 ug/min | INTRAVENOUS | Status: DC

## 2013-12-16 MED ORDER — SODIUM CHLORIDE 0.9 % IV SOLN
INTRAVENOUS | Status: DC
  Administered 2013-12-17: 07:00:00 via INTRAVENOUS

## 2013-12-16 MED ORDER — FENTANYL CITRATE 0.05 MG/ML IJ SOLN
INTRAMUSCULAR | Status: AC
  Filled 2013-12-16: qty 2

## 2013-12-16 MED ORDER — SIMVASTATIN 40 MG PO TABS: 40.0000 mg | ORAL_TABLET | Freq: Every day | ORAL | Status: DC

## 2013-12-16 MED ORDER — NITROGLYCERIN 0.2 MG/ML ON CALL CATH LAB
INTRAVENOUS | Status: AC
  Filled 2013-12-16: qty 1

## 2013-12-16 MED ORDER — HEPARIN (PORCINE) IN NACL 100-0.45 UNIT/ML-% IJ SOLN
1000.0000 [IU]/h | INTRAMUSCULAR | Status: DC
  Administered 2013-12-16: 800 [IU]/h via INTRAVENOUS
  Filled 2013-12-16 (×2): qty 250

## 2013-12-16 NOTE — Progress Notes (Signed)
Inpatient Diabetes Program Recommendations  AACE/ADA: New Consensus Statement on Inpatient Glycemic Control (2013)  Target Ranges:  Prepandial:   less than 140 mg/dL      Peak postprandial:   less than 180 mg/dL (1-2 hours)      Critically ill patients:  140 - 180 mg/dL   Results for Elizabeth Aguilar, Elizabeth Aguilar (MRN [id number]) as of 12/16/2013 13:18  Ref. Range 12/16/2013 09:10  Glucose Latest Range: 70-99 mg/dL 197 (H)   Diabetes history: Type 2 Diabetes Outpatient Diabetes medications: Metformin 500 mg BID Current orders for Inpatient glycemic control: NONE  Inpatient Diabetes Program Recommendations Correction (SSI): Please order CBGs with Novolog correction ACHS while inpatient.  Thanks, Barnie Alderman, RN, MSN, CCRN Diabetes Coordinator Inpatient Diabetes Program [phone] (Team Pager) [phone] (AP office) [phone] [facility] office)

## 2013-12-16 NOTE — Care Management Note (Addendum)
    Page 1 of 2   12/20/2013     11:07:05 AM   CARE MANAGEMENT NOTE 12/20/2013  Patient:  Elizabeth Aguilar, Elizabeth Aguilar   Account Number:  [id number]  Date Initiated:  12/16/2013  Documentation initiated by:  Elissa Hefty  Subjective/Objective Assessment:   adm w mi     Action/Plan:   was at home pta, pcp dr Tennis Must   Anticipated DC Date:  12/20/2013   Anticipated DC Plan:  [place]  CM consult  Medication Assistance      [facility] Choice  DURABLE MEDICAL EQUIPMENT   Choice offered to / List presented to:  C-1 Patient   DME arranged  VEST - LIFE VEST      DME agency  OTHER - SEE NOTE        Status of service:  Completed, signed off Medicare Important Message given?   (If response is "NO", the following Medicare IM given date fields will be blank) Date Medicare IM given:   Date Additional Medicare IM given:    Discharge Disposition:  HOME/SELF CARE  Per UR Regulation:  Reviewed for med. necessity/level of care/duration of stay  If discussed at [place] of Stay Meetings, dates discussed:    Comments:  12-20-13 1058 Jacqlyn Krauss, RN,BSN [phone] CM did call [place] is available. Co pay will be 3.60. Pt is aware. Pt will be fit by [facility] liaison for life vest before d/c. No further needs from CM at this time.   2/5 1342 debbie dowell rn,bsn spoke w pt who has medicare and medicaid. gave her 30day free brilinta card. pt will need medicaid prior approval form filled out since brilinta on non preferred list. will leave form on shadow chart.

## 2013-12-16 NOTE — CV Procedure (Signed)
Elizabeth Aguilar is a 73 y.o. female    [id number]  [id number] LOCATION:  FACILITY: [facility]  PHYSICIAN: Troy Sine, MD, [facility] 04-Sep-1941   DATE OF PROCEDURE:  12/16/2013    CARDIAC CATHETERIZATION/EMERGENT PERCUTANEOUS CORONARY INTERVENTION    HISTORY:  Elizabeth Aguilar is a 74 year old female who has a history of diabetes mellitus, hyperlipidemia who was transported from [facility] in a setting of an ST segment elevation anterior myocardial infarction. The patient admits to experiencing 3 days of chest pain. Upon arrival she was brought immediately to the cardiac catheterization laboratory for acute cardiac catheterization and possible intervention.   PROCEDURE:  Upon arrival to the catheterization laboratory, the patient was still experiencing 5 out 10 chest pain. She was premedicated with 2 mg of Versed and 25 mcg of fentanyl. The right femoral artery was punctured anteriorly and a 6 French sheath was inserted without difficulty with one anterior stick. Diagnostic catheterization was done utilizing Judkins 4 6 French coronary catheters. With the demonstration of total occlusion of the LAD after the first septal and diagonal vessel the decision was made to attempt emergent percutaneous coronary intervention. Angiomax bolus plus infusion was administered and brilinta 180 mg was given orally for antiplatelet therapy. A 6 [place] SL 3.5 guide was used. An Asahi medium wire was advanced down the LAD and  with a 2.0x12 mm sprinter legend balloon the lesion was crossed. Dilatation was done at 4, 5 and 10 atmospheres with this balloon. This resulted in resumption of TIMI-3 flow. There was evidence for a residual long stenosis just after the septal perforating artery. A 3.0x24 mm Promus Premier DES stent was then successfully inserted and deployed x2 at 13 and 14 atmospheres. A 3.25x20 mm Clyman Euphora balloon was used for post stent dilatation.  Scout angiography confirmed an excellent angiographic  result. There was minimal haziness just proximal to the stent in the region of the septal perforating artery takeoff without stenosis. A 6 French pigtail catheter was then used for RAO ventriculography At this point, the patient was completely pain-free. She had excellent result from stenting. The decision was made to continue her on Angiomax infusion at low dose for 4 hours post procedure. In light of her concomitant significant mid RCA stenosis and a dominant vessel the plan will be to bring her back to the laboratory tomorrow for relook at her LAD and planned PCI to her large right coronary artery.   HEMODYNAMICS:   Central Aorta: 120/66   Left Ventricle: 120/17/20  ANGIOGRAPHY:  Left main coronary artery was an angiographically normal vessel that bifurcated into the LAD and left circumflex coronary artery.  The left anterior descending artery gave rise to a proximal diagonal vessel and large septal perforating artery. It was 20% narrowing at the ostium of the diagonal vessel and 70% ostial narrowing in this large septal perforating artery. The LAD just beyond the septal perforating artery was totally occluded and there was TIMI 0 flow.  The left circumflex coronary artery was angiographically normal vessel gave rise to one major circumflex marginal vessel.  Right coronary artery is a large caliber dominant vessel that had focal 80-90% mid stenosis. The RCA supplied a large PDA and large posterolateral vessels.  Following successful percutaneous cardiac intervention to the LAD utilizing a 2.0x12 mm sprinter balloon, a 3.0x24 mm Promus Premier DES stent, postdilated to 3.25 mm, the 100% occlusion was reduced to 0%. The stent was placed immediately beyond the septal perforating artery takeoff. There was brisk TIMI-3  flow. There is no evidence for dissection. The LAD was large caliber vessel that wrapped around the LV apex. There is minimal haziness without stenosis just in the region of the  septal perforator takeoff.  RAO left ventriculography revealed moderate acute LV dysfunction. He was vigorous contractility of the upper basal upper mid anterior wall and significant vigorous contractility involving the inferior wall but there was a large segment of severe hypokinesis to akinesis involving the mid anterolateral wall extending around the apex and supplying the distal apical inferior segment. It was procedure graphic mitral regurgitation.   IMPRESSION:  Acute LV dysfunction with an EF of approximately 35-40% with severe hypokinesis involving the mid distal anterolateral wall apex and extending to involve the apical inferior segment with 2+ into angiographic mitral regurgitation.  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 There was 70% focal ostial narrowing in the septal perforating artery and 20% ostial narrowing in the diagonal vessel.  Normal left circumflex coronary artery.  Large dominant right coronary artery with focal 80-90% mid stenosis.  Successful emergent percutaneous coronary intervention of the left anterior descending artery without insertion of a 3.0x24 mm Promus Premier DES stent postdilated 3.25 mm percent occlusion being reduced to 0% and with resumption of brisk TIMI-3 flow.  RECOMMENDATION:   Elizabeth Aguilar has experienced 3 days duration of significant substernal chest pressure. She is diabetic and has history of hyperlipidemia and hypertension. Her ECG did show ST elevation anteriorly and anterolaterally. Presently, her acute infarct vessel was successfully reperfused with resumption of TIMI-3 flow. She does have significant acute wall motion abnormality and due to duration of chest pain and she may have significant scarring of her mid distal LAD territory. 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 Because of the importance of this right coronary artery remaining patent in light of her contralateral wall motion abnormality, the patient will be brought back to the laboratory tomorrow to undergo staged RCA percutaneous coronary intervention of this high-grade RCA stenosis.  Troy Sine, MD, [facility] 12/16/2013 11:41 AM

## 2013-12-16 NOTE — Progress Notes (Signed)
RFA sheath removed without complications. Pressure to site x 30 min . Site level zero. Pulses weakly palpable. Pt teaching done. Dressing applied.

## 2013-12-16 NOTE — Progress Notes (Signed)
12 lead ekg performed ,critical value noted. Acute MI, RN notified.[place]ique Brodie  RN

## 2013-12-16 NOTE — Progress Notes (Signed)
ANTICOAGULATION CONSULT NOTE - Initial Consult  Pharmacy Consult for heparin Indication: ACS s/p Cath  Allergies  Allergen Reactions  . Actos [Pioglitazone] Swelling  . Ceftin [Cefuroxime Axetil] Nausea Only  . Celebrex [Celecoxib]     Sickness  . Darvocet [Propoxyphene N-Acetaminophen] Nausea Only  . Doxycycline   . Erythromycin   . Vioxx [Rofecoxib]     Sickness   . Zithromax [Azithromycin] Nausea Only  . Zocor [Simvastatin]     Side effects    Patient Measurements: Height: 5\' 3"  (160 cm) Weight: 157 lb 6.5 oz (71.4 kg) IBW/kg (Calculated) : 52.4 Heparin Dosing Weight: 67 kg  Vital Signs: Temp: 97.8 F (36.6 C) (02/05 1100) Temp src: Oral (02/05 1100) BP: 162/51 mmHg (02/05 1100) Pulse Rate: 91 (02/05 1400)  Labs:  Recent Labs  12/16/13 0910  HGB 11.1*  HCT 33.1*  PLT 302  APTT 26  LABPROT 12.6  INR 0.96  CREATININE 0.95  CKTOTAL 112  CKMB 11.1*  TROPONINI 2.94*    Estimated Creatinine Clearance: 50 ml/min (by C-G formula based on Cr of 0.95).   Medical History: Past Medical History  Diagnosis Date  . Bronchitis   . Irritable bowel syndrome   . Polyarthralgia 1998  . Tachycardia   . Prediabetes   . Hyperlipidemia   . Type 2 diabetes mellitus   . Arthralgia     Medications:  Prescriptions prior to admission  Medication Sig Dispense Refill  . ALPRAZolam (XANAX) 0.5 MG tablet Take one BID prn  60 tablet  3  . amoxicillin (AMOXIL) 500 MG tablet Take 1 tablet (500 mg total) by mouth 3 (three) times daily.  30 tablet  0  . cyclobenzaprine (FLEXERIL) 10 MG tablet Take 1 tablet (10 mg total) by mouth at bedtime as needed for muscle spasms.  30 tablet  6  . esomeprazole (NEXIUM) 40 MG capsule Take 1 capsule (40 mg total) by mouth daily before breakfast.  30 capsule  6  . fluticasone (FLONASE) 50 MCG/ACT nasal spray Place 2 sprays into the nose daily.  16 g  6  . hydrochlorothiazide (HYDRODIURIL) 25 MG tablet Take 0.5 tablets (12.5 mg total) by mouth  daily.  30 tablet  6  . HYDROcodone-acetaminophen (NORCO/VICODIN) 5-325 MG per tablet Take 1 tablet by mouth 3 (three) times daily as needed.      [name] ketorolac (ACULAR) 0.5 % ophthalmic solution Place 1 drop into both eyes 4 (four) times daily.      [name] lisinopril (PRINIVIL,ZESTRIL) 5 MG tablet Take 1 tablet (5 mg total) by mouth daily.  30 tablet  3  . loratadine (CLARITIN) 10 MG tablet Take 1 tablet (10 mg total) by mouth daily.  30 tablet  5  . meloxicam (MOBIC) 7.5 MG tablet Take 1 tablet (7.5 mg total) by mouth daily.  30 tablet  4  . metFORMIN (GLUCOPHAGE) 500 MG tablet Take 1 tablet (500 mg total) by mouth 2 (two) times daily with a meal.  60 tablet  6  . metoprolol (TOPROL-XL) 200 MG 24 hr tablet Take 1 tablet (200 mg total) by mouth daily.  30 tablet  3  . Multiple Vitamin (MULTIVITAMIN) tablet Take 1 tablet by mouth daily.      . pravastatin (PRAVACHOL) 80 MG tablet Take 1 tablet (80 mg total) by mouth daily.  30 tablet  6  . vitamin C (ASCORBIC ACID) 500 MG tablet Take 500 mg by mouth daily.      . [DISCONTINUED] HYDROcodone-acetaminophen (NORCO/VICODIN) 5-325  MG per tablet One tid prn  90 tablet  0  . [DISCONTINUED] ketorolac (ACULAR) 0.4 % SOLN Place 1 drop into both eyes 4 (four) times daily.  1 Bottle  6   Scheduled:  . aspirin EC  81 mg Oral Daily  . ketorolac  1 drop Both Eyes QID  . [START ON 12/17/2013] lisinopril  5 mg Oral Daily  . [START ON 12/17/2013] metoprolol  200 mg Oral Daily  . [START ON 12/17/2013] pantoprazole  40 mg Oral Daily  . pravastatin  80 mg Oral Daily  . Ticagrelor  90 mg Oral BID    Assessment: 74 yo admitted with chest pain s/p cath to start heparin 6 hours post sheath removal.   Goal of Therapy:  Heparin level 0.3-0.7 units/ml Monitor platelets by anticoagulation protocol: Yes   Plan:  - Begin Heparin at 800 units/hr  - heparin level every 8 hours and daily with CBC daily   Zannie Cove, PharmD Candidate    I have reviewed the above and  agree with the plan.  Hildred Laser, Pharm D 12/16/2013 3:24 PM

## 2013-12-16 NOTE — H&P (Signed)
Cardiologist:  Drenda Freeze)  Elizabeth Aguilar is an 73 y.o. female.   Chief Complaint:  Chest pain HPI:   The patient is a 73 yo female with a history of HLD, DMII, tachycardia, IBS.  The patient reports onset of CP three days ago and it would come and go.  It initially felt like previous episodes of tachycardia which she was seen by her previous cardiologist for.  The pain progressively worsened and started to radiate to her left arm, neck and jaw.   She also reports Diaphoresis, nausea.  She has several teeth which need to be removed and just finished 14 days of penicillin for an abcess.  One broke yesterday.  The patient currently denies vomiting, fever,  shortness of breath, orthopnea, dizziness, PND, cough, congestion, abdominal pain, hematochezia, melena, lower extremity edema.  Initial troponin was 2.94.   Medications: Prior to Admission medications   Medication Sig Start Date End Date Taking? Authorizing Provider  ALPRAZolam Duanne Moron) 0.5 MG tablet Take one BID prn 08/12/13  Yes Kathyrn Drown, MD  amoxicillin (AMOXIL) 500 MG tablet Take 1 tablet (500 mg total) by mouth 3 (three) times daily. 11/17/13  Yes Kathyrn Drown, MD  cyclobenzaprine (FLEXERIL) 10 MG tablet Take 1 tablet (10 mg total) by mouth at bedtime as needed for muscle spasms. 08/12/13  Yes Kathyrn Drown, MD  esomeprazole (NEXIUM) 40 MG capsule Take 1 capsule (40 mg total) by mouth daily before breakfast. 08/12/13  Yes Kathyrn Drown, MD  fluticasone (FLONASE) 50 MCG/ACT nasal spray Place 2 sprays into the nose daily. 08/12/13  Yes Kathyrn Drown, MD  hydrochlorothiazide (HYDRODIURIL) 25 MG tablet Take 0.5 tablets (12.5 mg total) by mouth daily. 08/12/13  Yes Kathyrn Drown, MD  HYDROcodone-acetaminophen (NORCO/VICODIN) 5-325 MG per tablet Take 1 tablet by mouth 3 (three) times daily as needed. 11/17/13 11/17/16 Yes Kathyrn Drown, MD  ketorolac (ACULAR) 0.5 % ophthalmic solution Place 1 drop into both eyes 4 (four) times daily.   Yes  Historical Provider, MD  lisinopril (PRINIVIL,ZESTRIL) 5 MG tablet Take 1 tablet (5 mg total) by mouth daily. 09/07/13  Yes Kathyrn Drown, MD  loratadine (CLARITIN) 10 MG tablet Take 1 tablet (10 mg total) by mouth daily. 09/07/13  Yes Kathyrn Drown, MD  meloxicam (MOBIC) 7.5 MG tablet Take 1 tablet (7.5 mg total) by mouth daily. 09/15/13  Yes Kathyrn Drown, MD  metFORMIN (GLUCOPHAGE) 500 MG tablet Take 1 tablet (500 mg total) by mouth 2 (two) times daily with a meal. 08/12/13  Yes Kathyrn Drown, MD  metoprolol (TOPROL-XL) 200 MG 24 hr tablet Take 1 tablet (200 mg total) by mouth daily. 09/07/13  Yes Kathyrn Drown, MD  Multiple Vitamin (MULTIVITAMIN) tablet Take 1 tablet by mouth daily.   Yes Historical Provider, MD  pravastatin (PRAVACHOL) 80 MG tablet Take 1 tablet (80 mg total) by mouth daily. 08/12/13  Yes Kathyrn Drown, MD  vitamin C (ASCORBIC ACID) 500 MG tablet Take 500 mg by mouth daily.   Yes Historical Provider, MD    Past Medical History  Diagnosis Date  . Bronchitis   . Irritable bowel syndrome   . Polyarthralgia 1998  . Tachycardia   . Prediabetes   . Hyperlipidemia   . Type 2 diabetes mellitus   . Arthralgia     No past surgical history on file.  Family History  Problem Relation Age of Onset  . Diabetes Mother   . Diabetes  Daughter   . Diabetes Son    Social History:  reports that she has never smoked. She does not have any smokeless tobacco history on file. Her alcohol and drug histories are not on file.  Allergies:  Allergies  Allergen Reactions  . Actos [Pioglitazone] Swelling  . Ceftin [Cefuroxime Axetil] Nausea Only  . Celebrex [Celecoxib]     Sickness  . Darvocet [Propoxyphene N-Acetaminophen] Nausea Only  . Doxycycline   . Erythromycin   . Vioxx [Rofecoxib]     Sickness   . Zithromax [Azithromycin] Nausea Only  . Zocor [Simvastatin]     Side effects    Medications Prior to Admission  Medication Sig Dispense Refill  . ALPRAZolam (XANAX) 0.5  MG tablet Take one BID prn  60 tablet  3  . amoxicillin (AMOXIL) 500 MG tablet Take 1 tablet (500 mg total) by mouth 3 (three) times daily.  30 tablet  0  . cyclobenzaprine (FLEXERIL) 10 MG tablet Take 1 tablet (10 mg total) by mouth at bedtime as needed for muscle spasms.  30 tablet  6  . esomeprazole (NEXIUM) 40 MG capsule Take 1 capsule (40 mg total) by mouth daily before breakfast.  30 capsule  6  . fluticasone (FLONASE) 50 MCG/ACT nasal spray Place 2 sprays into the nose daily.  16 g  6  . hydrochlorothiazide (HYDRODIURIL) 25 MG tablet Take 0.5 tablets (12.5 mg total) by mouth daily.  30 tablet  6  . HYDROcodone-acetaminophen (NORCO/VICODIN) 5-325 MG per tablet Take 1 tablet by mouth 3 (three) times daily as needed.      [name] ketorolac (ACULAR) 0.5 % ophthalmic solution Place 1 drop into both eyes 4 (four) times daily.      [name] lisinopril (PRINIVIL,ZESTRIL) 5 MG tablet Take 1 tablet (5 mg total) by mouth daily.  30 tablet  3  . loratadine (CLARITIN) 10 MG tablet Take 1 tablet (10 mg total) by mouth daily.  30 tablet  5  . meloxicam (MOBIC) 7.5 MG tablet Take 1 tablet (7.5 mg total) by mouth daily.  30 tablet  4  . metFORMIN (GLUCOPHAGE) 500 MG tablet Take 1 tablet (500 mg total) by mouth 2 (two) times daily with a meal.  60 tablet  6  . metoprolol (TOPROL-XL) 200 MG 24 hr tablet Take 1 tablet (200 mg total) by mouth daily.  30 tablet  3  . Multiple Vitamin (MULTIVITAMIN) tablet Take 1 tablet by mouth daily.      . pravastatin (PRAVACHOL) 80 MG tablet Take 1 tablet (80 mg total) by mouth daily.  30 tablet  6  . vitamin C (ASCORBIC ACID) 500 MG tablet Take 500 mg by mouth daily.      . [DISCONTINUED] HYDROcodone-acetaminophen (NORCO/VICODIN) 5-325 MG per tablet One tid prn  90 tablet  0  . [DISCONTINUED] ketorolac (ACULAR) 0.4 % SOLN Place 1 drop into both eyes 4 (four) times daily.  1 Bottle  6    Results for orders placed during the hospital encounter of 12/16/13 (from the past 48 hour(s))  CK  TOTAL AND CKMB     Status: Abnormal   Collection Time    12/16/13  9:10 AM      Result Value Range   Total CK 112  7 - 177 U/L   CK, MB 11.1 (*) 0.3 - 4.0 ng/mL   Comment: CRITICAL RESULT CALLED TO, READ BACK BY AND VERIFIED WITH:     OLEARY,T RN @ 1010 12/16/13 LEONARD,A   Relative Index  9.9 (*) 0.0 - 2.5  TROPONIN I     Status: Abnormal   Collection Time    12/16/13  9:10 AM      Result Value Range   Troponin I 2.94 (*) <0.30 ng/mL   Comment:            Due to the release kinetics of cTnI,     a negative result within the first hours     of the onset of symptoms does not rule out     myocardial infarction with certainty.     If myocardial infarction is still suspected,     repeat the test at appropriate intervals.     CRITICAL RESULT CALLED TO, READ BACK BY AND VERIFIED WITH:     OLEARY,T RN @ 1010 12/16/13 LEONARD,A  CBC WITH DIFFERENTIAL     Status: Abnormal   Collection Time    12/16/13  9:10 AM      Result Value Range   WBC 12.5 (*) 4.0 - 10.5 K/uL   RBC 3.65 (*) 3.87 - 5.11 MIL/uL   Hemoglobin 11.1 (*) 12.0 - 15.0 g/dL   HCT 33.1 (*) 36.0 - 46.0 %   MCV 90.7  78.0 - 100.0 fL   MCH 30.4  26.0 - 34.0 pg   MCHC 33.5  30.0 - 36.0 g/dL   RDW 13.6  11.5 - 15.5 %   Platelets 302  150 - 400 K/uL   Neutrophils Relative % 78 (*) 43 - 77 %   Neutro Abs 9.8 (*) 1.7 - 7.7 K/uL   Lymphocytes Relative 16  12 - 46 %   Lymphs Abs 2.0  0.7 - 4.0 K/uL   Monocytes Relative 5  3 - 12 %   Monocytes Absolute 0.6  0.1 - 1.0 K/uL   Eosinophils Relative 2  0 - 5 %   Eosinophils Absolute 0.2  0.0 - 0.7 K/uL   Basophils Relative 0  0 - 1 %   Basophils Absolute 0.0  0.0 - 0.1 K/uL  COMPREHENSIVE METABOLIC PANEL     Status: Abnormal   Collection Time    12/16/13  9:10 AM      Result Value Range   Sodium 141  137 - 147 mEq/L   Potassium 3.5 (*) 3.7 - 5.3 mEq/L   Chloride 101  96 - 112 mEq/L   CO2 23  19 - 32 mEq/L   Glucose, Bld 197 (*) 70 - 99 mg/dL   BUN 21  6 - 23 mg/dL   Creatinine,  Ser 0.95  0.50 - 1.10 mg/dL   Calcium 8.7  8.4 - 10.5 mg/dL   Total Protein 6.4  6.0 - 8.3 g/dL   Albumin 3.1 (*) 3.5 - 5.2 g/dL   AST 25  0 - 37 U/L   ALT 12  0 - 35 U/L   Alkaline Phosphatase 96  39 - 117 U/L   Total Bilirubin 0.2 (*) 0.3 - 1.2 mg/dL   GFR calc non Af Amer 58 (*) >90 mL/min   GFR calc Af Amer 67 (*) >90 mL/min   Comment: (NOTE)     The eGFR has been calculated using the CKD EPI equation.     This calculation has not been validated in all clinical situations.     eGFR's persistently <90 mL/min signify possible Chronic Kidney     Disease.  LIPID PANEL     Status: Abnormal   Collection Time    12/16/13  9:10 AM  Result Value Range   Cholesterol 153  0 - 200 mg/dL   Triglycerides 199 (*) <150 mg/dL   HDL 38 (*) >39 mg/dL   Total CHOL/HDL Ratio 4.0     VLDL 40  0 - 40 mg/dL   LDL Cholesterol 75  0 - 99 mg/dL   Comment:            Total Cholesterol/HDL:CHD Risk     Coronary Heart Disease Risk Table                         Men   Women      1/2 Average Risk   3.4   3.3      Average Risk       5.0   4.4      2 X Average Risk   9.6   7.1      3 X Average Risk  23.4   11.0                Use the calculated Patient Ratio     above and the CHD Risk Table     to determine the patient's CHD Risk.                ATP III CLASSIFICATION (LDL):      <100     mg/dL   Optimal      100-129  mg/dL   Near or Above                        Optimal      130-159  mg/dL   Borderline      160-189  mg/dL   High      >190     mg/dL   Very High  PROTIME-INR     Status: None   Collection Time    12/16/13  9:10 AM      Result Value Range   Prothrombin Time 12.6  11.6 - 15.2 seconds   INR 0.96  0.00 - 1.49  APTT     Status: None   Collection Time    12/16/13  9:10 AM      Result Value Range   aPTT 26  24 - 37 seconds   No results found.  Review of Systems  Constitutional: Positive for diaphoresis. Negative for fever and chills.  HENT: Negative for congestion and sore  throat.   Respiratory: Negative for cough, shortness of breath and wheezing.   Cardiovascular: Positive for chest pain. Negative for orthopnea, leg swelling and PND.  Gastrointestinal: Positive for nausea. Negative for vomiting, blood in stool and melena.  Genitourinary: Negative for hematuria.  Musculoskeletal: Positive for myalgias (Left arm pain with radiation to jaw. ) and neck pain.  Neurological: Negative for dizziness.  All other systems reviewed and are negative.    Blood pressure 162/51, pulse 88, temperature 97.8 F (36.6 C), temperature source Oral, resp. rate 22, height _0  (1.6 m), weight 157 lb 6.5 oz (71.4 kg), SpO2 100.00%. Physical Exam  Nursing note and vitals reviewed. Constitutional: She is oriented to person, place, and time. She appears well-developed and well-nourished. No distress.  HENT:  Head: Normocephalic and atraumatic.  Mouth/Throat: No oropharyngeal exudate.  Eyes: EOM are normal. Pupils are equal, round, and reactive to light. No scleral icterus.  Neck: Normal range of motion. Neck supple. No JVD present.  Cardiovascular: Normal rate, regular rhythm, S1 normal and  S2 normal.   No murmur heard. Pulses:      Radial pulses are 2+ on the right side, and 2+ on the left side.       Dorsalis pedis pulses are 2+ on the right side, and 2+ on the left side.  No carotid bruit  Respiratory: Effort normal and breath sounds normal. She has no wheezes. She has no rales.  GI: Bowel sounds are normal. She exhibits no distension.  Musculoskeletal: She exhibits no edema.  Lymphadenopathy:    She has no cervical adenopathy.  Neurological: She is alert and oriented to person, place, and time.  Skin: Skin is warm and dry.  Psychiatric: She has a normal mood and affect.     Assessment/Plan Principal Problem:   ST elevation (STEMI) myocardial infarction involving left anterior descending coronary artery Active Problems:   Hyperlipemia   Essential hypertension,  benign   Diabetes type 2, controlled  Plan:  The patient was taken for emergent left heart cath.   HAGER, BRYAN 12/16/2013, 11:27 AM   Patient seen and examined. Agree with assessment and plan. This is a 72 year old female history of diabetes mellitus, hyperlipidemia, and has experienced episodic palpitations in the past. Over the last 3 days she has developed recurrent episodes of chest tightness and pressure. She was brought acutely to the cardiac catheterization laboratory via EMS and during transport she was encoded as an anterior wall STEMI. Her ECG confirms anterior ST elevation as well as anterolateral ST elevation. She is still having 5-10 residual chest pain upon presentation. I discussed the emergent catheterization procedure with the patient and the high likelihood of a blocked left anterior descending artery. She understands the procedure and consents to proceed.   Troy Sine, MD, [facility] 12/16/2013 12:04 PM

## 2013-12-17 ENCOUNTER — Encounter (HOSPITAL_COMMUNITY): Payer: Self-pay | Admitting: *Deleted

## 2013-12-17 ENCOUNTER — Encounter (HOSPITAL_COMMUNITY)
Admission: RE | Disposition: A | Discharge status: Home / Self Care | Payer: Medicare Other | Source: Ambulatory Visit | Attending: Cardiovascular Disease

## 2013-12-17 DIAGNOSIS — E785 Hyperlipidemia, unspecified: Secondary | ICD-10-CM | POA: Diagnosis not present

## 2013-12-17 DIAGNOSIS — I10 Essential (primary) hypertension: Secondary | ICD-10-CM | POA: Diagnosis not present

## 2013-12-17 DIAGNOSIS — I255 Ischemic cardiomyopathy: Secondary | ICD-10-CM | POA: Diagnosis present

## 2013-12-17 DIAGNOSIS — I472 Ventricular tachycardia: Secondary | ICD-10-CM | POA: Diagnosis not present

## 2013-12-17 DIAGNOSIS — I2510 Atherosclerotic heart disease of native coronary artery without angina pectoris: Secondary | ICD-10-CM | POA: Diagnosis not present

## 2013-12-17 DIAGNOSIS — E119 Type 2 diabetes mellitus without complications: Secondary | ICD-10-CM

## 2013-12-17 DIAGNOSIS — M25559 Pain in unspecified hip: Secondary | ICD-10-CM

## 2013-12-17 DIAGNOSIS — M25569 Pain in unspecified knee: Secondary | ICD-10-CM

## 2013-12-17 DIAGNOSIS — I2109 ST elevation (STEMI) myocardial infarction involving other coronary artery of anterior wall: Secondary | ICD-10-CM | POA: Diagnosis not present

## 2013-12-17 DIAGNOSIS — Z9861 Coronary angioplasty status: Secondary | ICD-10-CM

## 2013-12-17 HISTORY — PX: PERCUTANEOUS CORONARY STENT INTERVENTION (PCI-S): SHX5485

## 2013-12-17 HISTORY — PX: LEFT HEART CATHETERIZATION WITH CORONARY ANGIOGRAM: SHX5451

## 2013-12-17 LAB — BASIC METABOLIC PANEL WITH GFR
BUN: 16 mg/dL (ref 6–23)
CO2: 22 meq/L (ref 19–32)
Calcium: 8.6 mg/dL (ref 8.4–10.5)
Chloride: 105 meq/L (ref 96–112)
Creatinine, Ser: 0.94 mg/dL (ref 0.50–1.10)
GFR calc Af Amer: 68 mL/min — ABNORMAL LOW
GFR calc non Af Amer: 59 mL/min — ABNORMAL LOW
Glucose, Bld: 109 mg/dL — ABNORMAL HIGH (ref 70–99)
Potassium: 3.7 meq/L (ref 3.7–5.3)
Sodium: 142 meq/L (ref 137–147)

## 2013-12-17 LAB — URINALYSIS W MICROSCOPIC + REFLEX CULTURE
Bilirubin Urine: NEGATIVE
Glucose, UA: NEGATIVE mg/dL
Hgb urine dipstick: NEGATIVE
Ketones, ur: NEGATIVE mg/dL
Leukocytes, UA: NEGATIVE
Nitrite: NEGATIVE
Protein, ur: NEGATIVE mg/dL
Specific Gravity, Urine: 1.012 (ref 1.005–1.030)
Urobilinogen, UA: 0.2 mg/dL (ref 0.0–1.0)
pH: 6.5 (ref 5.0–8.0)

## 2013-12-17 LAB — CBC
HCT: 31.5 % — ABNORMAL LOW (ref 36.0–46.0)
Hemoglobin: 10.5 g/dL — ABNORMAL LOW (ref 12.0–15.0)
MCH: 29.9 pg (ref 26.0–34.0)
MCHC: 33.3 g/dL (ref 30.0–36.0)
MCV: 89.7 fL (ref 78.0–100.0)
Platelets: 269 K/uL (ref 150–400)
RBC: 3.51 MIL/uL — ABNORMAL LOW (ref 3.87–5.11)
RDW: 13.6 % (ref 11.5–15.5)
WBC: 9 K/uL (ref 4.0–10.5)

## 2013-12-17 LAB — LIPID PANEL
CHOL/HDL RATIO: 3.2 ratio
Cholesterol: 123 mg/dL (ref 0–200)
HDL: 39 mg/dL — AB (ref >39)
LDL CALC: 63 mg/dL (ref 0–99)
Triglycerides: 106 mg/dL (ref <150)
VLDL: 21 mg/dL (ref 0–40)

## 2013-12-17 LAB — HEPARIN LEVEL (UNFRACTIONATED): Heparin Unfractionated: 0.17 [IU]/mL — ABNORMAL LOW (ref 0.30–0.70)

## 2013-12-17 LAB — GLUCOSE, CAPILLARY
GLUCOSE-CAPILLARY: 126 mg/dL — AB (ref 70–99)
GLUCOSE-CAPILLARY: 101 mg/dL — AB (ref 70–99)
GLUCOSE-CAPILLARY: 107 mg/dL — AB (ref 70–99)
GLUCOSE-CAPILLARY: 198 mg/dL — AB (ref 70–99)

## 2013-12-17 LAB — TROPONIN I: Troponin I: 3.48 ng/mL

## 2013-12-17 LAB — POCT ACTIVATED CLOTTING TIME: ACTIVATED CLOTTING TIME: 420 s

## 2013-12-17 SURGERY — PERCUTANEOUS CORONARY STENT INTERVENTION (PCI-S)
Anesthesia: Moderate Sedation

## 2013-12-17 MED ORDER — INSULIN ASPART 100 UNIT/ML ~~LOC~~ SOLN
0.0000 [IU] | Freq: Three times a day (TID) | SUBCUTANEOUS | Status: DC
  Administered 2013-12-17 – 2013-12-18 (×2): 1 [IU] via SUBCUTANEOUS
  Administered 2013-12-19: 2 [IU] via SUBCUTANEOUS
  Administered 2013-12-20: 1 [IU] via SUBCUTANEOUS

## 2013-12-17 MED ORDER — MIDAZOLAM HCL 2 MG/2ML IJ SOLN
INTRAMUSCULAR | Status: AC
  Filled 2013-12-17: qty 2

## 2013-12-17 MED ORDER — FENTANYL CITRATE 0.05 MG/ML IJ SOLN
INTRAMUSCULAR | Status: AC
  Filled 2013-12-17: qty 2

## 2013-12-17 MED ORDER — SODIUM CHLORIDE 0.9 % IV SOLN
INTRAVENOUS | Status: DC
  Administered 2013-12-17: 17:00:00 via INTRAVENOUS

## 2013-12-17 MED ORDER — METOPROLOL SUCCINATE ER 100 MG PO TB24
200.0000 mg | ORAL_TABLET | Freq: Every day | ORAL | Status: DC
  Administered 2013-12-17 – 2013-12-20 (×4): 200 mg via ORAL
  Filled 2013-12-17 (×4): qty 2

## 2013-12-17 MED ORDER — ATROPINE SULFATE 0.1 MG/ML IJ SOLN
INTRAMUSCULAR | Status: AC
  Filled 2013-12-17: qty 10

## 2013-12-17 MED ORDER — NITROGLYCERIN 0.2 MG/ML ON CALL CATH LAB
INTRAVENOUS | Status: AC
  Filled 2013-12-17: qty 1

## 2013-12-17 MED ORDER — BIVALIRUDIN 250 MG IV SOLR
INTRAVENOUS | Status: AC
  Filled 2013-12-17: qty 250

## 2013-12-17 MED ORDER — ASPIRIN EC 81 MG PO TBEC
81.0000 mg | DELAYED_RELEASE_TABLET | Freq: Every day | ORAL | Status: DC
  Administered 2013-12-17 – 2013-12-20 (×4): 81 mg via ORAL
  Filled 2013-12-17 (×4): qty 1

## 2013-12-17 MED ORDER — ONDANSETRON HCL 4 MG/2ML IJ SOLN: 4.0000 mg | Freq: Four times a day (QID) | INTRAMUSCULAR | Status: DC | PRN

## 2013-12-17 MED ORDER — ACETAMINOPHEN 325 MG PO TABS
650.0000 mg | ORAL_TABLET | ORAL | Status: DC | PRN
  Administered 2013-12-18: 650 mg via ORAL

## 2013-12-17 MED ORDER — LIDOCAINE HCL (PF) 1 % IJ SOLN
INTRAMUSCULAR | Status: AC
  Filled 2013-12-17: qty 30

## 2013-12-17 MED ORDER — HEPARIN (PORCINE) IN NACL 2-0.9 UNIT/ML-% IJ SOLN
INTRAMUSCULAR | Status: AC
  Filled 2013-12-17: qty 1500

## 2013-12-17 MED ORDER — TICAGRELOR 90 MG PO TABS
90.0000 mg | ORAL_TABLET | Freq: Two times a day (BID) | ORAL | Status: DC
  Filled 2013-12-17: qty 1

## 2013-12-17 MED FILL — Sodium Chloride IV Soln 0.9%: INTRAVENOUS | Qty: 50 | Status: AC

## 2013-12-17 NOTE — H&P (View-Only) (Signed)
Subjective: Pt had cp starting 3 days ago but yesterday she woke up at 3 am to use the restroom and lied back in bed until 6 am then developed worse CP and her daughter called EMS.  Denies CP currently.  She feels like she is not emptying her bladder and has a history of UTI.  Denies nausea/vomiting.   PCP is Dr. Domenic Moras in [place].    Objective: Vital signs in last 24 hours: Filed Vitals:   12/17/13 0400 12/17/13 0452 12/17/13 0500 12/17/13 0600  BP: 132/65   146/67  Pulse: 89  94 95  Temp: 98.3 F (36.8 C)     TempSrc: Oral     Resp: 19  20 18   Height:      Weight:  158 lb 3.2 oz (71.759 kg)    SpO2: 98%  97% 97%   Weight change:   Intake/Output Summary (Last 24 hours) at 12/17/13 0728 Last data filed at 12/17/13 0647  Gross per 24 hour  Intake   1758 ml  Output    900 ml  Net    858 ml   Vitals reviewed. 102, 98% on 2L 19 146/67 (90) General: resting in bed, NAD HEENT: [place]/at, no scleral icterus Cardiac: slightly tachycardic, no rubs, murmurs or gallops Pulm: clear to auscultation bilaterally anteriorly  Abd: soft, nontender, nondistended, BS present Ext: warm and well perfused, no pedal edema Neuro: alert and oriented X3, cranial nerves II-XII grossly intact Cath site: right groin. Non blooding/no hematoma/bruit.   Lab Results: Basic Metabolic Panel:  Recent Labs Lab 12/16/13 0910 12/17/13 0213  NA 141 142  K 3.5* 3.7  CL 101 105  CO2 23 22  GLUCOSE 197* 109*  BUN 21 16  CREATININE 0.95 0.94  CALCIUM 8.7 8.6   Liver Function Tests:  Recent Labs Lab 12/16/13 0910  AST 25  ALT 12  ALKPHOS 96  BILITOT 0.2*  PROT 6.4  ALBUMIN 3.1*   CBC:  Recent Labs Lab 12/16/13 0902 12/16/13 0910 12/17/13 0213  WBC  --  12.5* 9.0  NEUTROABS  --  9.8*  --   HGB 12.6 11.1* 10.5*  HCT 37.0 33.1* 31.5*  MCV  --  90.7 89.7  PLT  --  302 269   Cardiac Enzymes:  Recent Labs Lab 12/16/13 0910 12/16/13 1530 12/16/13 2104 12/17/13 0213    CKTOTAL 112  --   --   --   CKMB 11.1*  --   --   --   TROPONINI 2.94* 10.91* <0.30 3.48*   Hemoglobin A1C:  Recent Labs Lab 12/16/13 0910  HGBA1C 6.5*   Fasting Lipid Panel:  Recent Labs Lab 12/17/13 0213  CHOL 123  HDL 39*  LDLCALC 63  TRIG 106  CHOLHDL 3.2   Thyroid Function Tests:  Recent Labs Lab 12/16/13 1530  TSH 1.878   Coagulation:  Recent Labs Lab 12/16/13 0910  LABPROT 12.6  INR 0.96   Misc. Labs: none  Micro Results: Recent Results (from the past 240 hour(s))  MRSA PCR SCREENING     Status: None   Collection Time    12/16/13 11:13 AM      Result Value Range Status   MRSA by PCR NEGATIVE  NEGATIVE Final   Comment:            The GeneXpert MRSA Assay (FDA     approved for NASAL specimens     only), is one component of a  comprehensive MRSA colonization     surveillance program. It is not     intended to diagnose MRSA     infection nor to guide or     monitor treatment for     MRSA infections.   Studies/Results: No results found. Medications:  Scheduled Meds: . [START ON 12/18/2013] aspirin EC  81 mg Oral Daily  . ketorolac  1 drop Both Eyes QID  . lisinopril  5 mg Oral Daily  . metoprolol  200 mg Oral Daily  . pantoprazole  40 mg Oral Daily  . pravastatin  80 mg Oral Daily  . sodium chloride  3 mL Intravenous Q12H  . Ticagrelor  90 mg Oral BID   Continuous Infusions: . sodium chloride 10 mL/hr at 12/16/13 2200  . sodium chloride 10 mL/hr at 12/17/13 0647  . heparin 800 Units/hr (12/16/13 2233)  . nitroGLYCERIN 2 mcg/min (12/17/13 0647)   PRN Meds:.sodium chloride, acetaminophen, ALPRAZolam, morphine injection, nitroGLYCERIN, ondansetron (ZOFRAN) IV, sodium chloride Assessment/Plan: 73 y.o PMH bronchitis, IBS, poor dentition, diabetes, HTN, HLD, polyarthragia presented with chest pain x 3 days found to have STEMI 12/16/13 s/p cath 12/16/13.     1. ST elevation (STEMI) myocardial infarction involving left anterior descending  coronary artery -EKG with anterior ST elevation and AL ST elevation    Ref. Range 12/16/2013 09:10 12/16/2013 15:30 12/16/2013 21:04 12/17/2013 02:13  CK, MB Latest Range: 0.3-4.0 ng/mL 11.1 (HH)     CK Total Latest Range: 7-177 U/L 112     Troponin I Latest Range: <0.30 ng/mL 2.94 (HH) 10.91 (HH) <0.30 3.48 (HH)   -s/p PCI (3.0 x 24 mm Promus Premier DES) of the LAD for total occlusion after 1st septal and diagonal vessel.  The left anterior descending artery gave rise to a proximal diagonal vessel and large septal perforating artery. It was 20% narrowing at the ostium of the diagonal vessel and 70% ostial narrowing in this large septal perforating artery. The LAD just beyond the septal perforating artery was totally occluded and there was TIMI 0 flow.  She also has significant mid RCA stenosis (80-90%) and a dominant vessel.  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  -Brilinta 90 mg bid, Heparin gtt, Asprin 81 mg, ACEI, BB, morphine 2 mg q2 prn pain, NTG gtt, prn NTG SL, prn Zofran  -She will go back to cath lab 2/6 to re look at LAD and plan PCI RCA    2.   Diabetes type 2, controlled -HA1C 6.5  -She was on Metformin 500 mg bid  -monitor cbg, SSI-S  3. Hyperlipemia Lipid Panel     Component Value Date/Time   CHOL 123 12/17/2013 0213   TRIG 106 12/17/2013 0213   HDL 39* 12/17/2013 0213   CHOLHDL 3.2 12/17/2013 0213   VLDL 21 12/17/2013 0213   LDLCALC 63 12/17/2013 0213   Continue statin. On Pravachol 80 at home   4. Essential hypertension, benign -On HCTZ 25 mg at home, Lisinopril 5 mg qd, Toprol XL 200 mg  -On lisinopril, Toprol XL 200 mg   5. F/E/N -NS 125 cc/hr  -HypoK resolved  -NPO  6. DVT px  -Brilinta        LOS: 1 day   Cresenciano Genre, MD [phone] 12/17/2013, 7:28 AM

## 2013-12-17 NOTE — Progress Notes (Signed)
 Subjective: Pt had cp starting 3 days ago but yesterday she woke up at 3 am to use the restroom and lied back in bed until 6 am then developed worse CP and her daughter called EMS.  Denies CP currently.  She feels like she is not emptying her bladder and has a history of UTI.  Denies nausea/vomiting.   PCP is Dr. Scott Luken in [place].    Objective: Vital signs in last 24 hours: Filed Vitals:   12/17/13 0400 12/17/13 0452 12/17/13 0500 12/17/13 0600  BP: 132/65   146/67  Pulse: 89  94 95  Temp: 98.3 F (36.8 C)     TempSrc: Oral     Resp: 19  20 18  Height:      Weight:  158 lb 3.2 oz (71.759 kg)    SpO2: 98%  97% 97%   Weight change:   Intake/Output Summary (Last 24 hours) at 12/17/13 0728 Last data filed at 12/17/13 0647  Gross per 24 hour  Intake   1758 ml  Output    900 ml  Net    858 ml   Vitals reviewed. 102, 98% on 2L 19 146/67 (90) General: resting in bed, NAD HEENT: [place]/at, no scleral icterus Cardiac: slightly tachycardic, no rubs, murmurs or gallops Pulm: clear to auscultation bilaterally anteriorly  Abd: soft, nontender, nondistended, BS present Ext: warm and well perfused, no pedal edema Neuro: alert and oriented X3, cranial nerves II-XII grossly intact Cath site: right groin. Non blooding/no hematoma/bruit.   Lab Results: Basic Metabolic Panel:  Recent Labs Lab 12/16/13 0910 12/17/13 0213  NA 141 142  K 3.5* 3.7  CL 101 105  CO2 23 22  GLUCOSE 197* 109*  BUN 21 16  CREATININE 0.95 0.94  CALCIUM 8.7 8.6   Liver Function Tests:  Recent Labs Lab 12/16/13 0910  AST 25  ALT 12  ALKPHOS 96  BILITOT 0.2*  PROT 6.4  ALBUMIN 3.1*   CBC:  Recent Labs Lab 12/16/13 0902 12/16/13 0910 12/17/13 0213  WBC  --  12.5* 9.0  NEUTROABS  --  9.8*  --   HGB 12.6 11.1* 10.5*  HCT 37.0 33.1* 31.5*  MCV  --  90.7 89.7  PLT  --  302 269   Cardiac Enzymes:  Recent Labs Lab 12/16/13 0910 12/16/13 1530 12/16/13 2104 12/17/13 0213    CKTOTAL 112  --   --   --   CKMB 11.1*  --   --   --   TROPONINI 2.94* 10.91* <0.30 3.48*   Hemoglobin A1C:  Recent Labs Lab 12/16/13 0910  HGBA1C 6.5*   Fasting Lipid Panel:  Recent Labs Lab 12/17/13 0213  CHOL 123  HDL 39*  LDLCALC 63  TRIG 106  CHOLHDL 3.2   Thyroid Function Tests:  Recent Labs Lab 12/16/13 1530  TSH 1.878   Coagulation:  Recent Labs Lab 12/16/13 0910  LABPROT 12.6  INR 0.96   Misc. Labs: none  Micro Results: Recent Results (from the past 240 hour(s))  MRSA PCR SCREENING     Status: None   Collection Time    12/16/13 11:13 AM      Result Value Range Status   MRSA by PCR NEGATIVE  NEGATIVE Final   Comment:            The GeneXpert MRSA Assay (FDA     approved for NASAL specimens     only), is one component of a       comprehensive MRSA colonization     surveillance program. It is not     intended to diagnose MRSA     infection nor to guide or     monitor treatment for     MRSA infections.   Studies/Results: No results found. Medications:  Scheduled Meds: . [START ON 12/18/2013] aspirin EC  81 mg Oral Daily  . ketorolac  1 drop Both Eyes QID  . lisinopril  5 mg Oral Daily  . metoprolol  200 mg Oral Daily  . pantoprazole  40 mg Oral Daily  . pravastatin  80 mg Oral Daily  . sodium chloride  3 mL Intravenous Q12H  . Ticagrelor  90 mg Oral BID   Continuous Infusions: . sodium chloride 10 mL/hr at 12/16/13 2200  . sodium chloride 10 mL/hr at 12/17/13 0647  . heparin 800 Units/hr (12/16/13 2233)  . nitroGLYCERIN 2 mcg/min (12/17/13 0647)   PRN Meds:.sodium chloride, acetaminophen, ALPRAZolam, morphine injection, nitroGLYCERIN, ondansetron (ZOFRAN) IV, sodium chloride Assessment/Plan: 73 y.o PMH bronchitis, IBS, poor dentition, diabetes, HTN, HLD, polyarthragia presented with chest pain x 3 days found to have STEMI 12/16/13 s/p cath 12/16/13.     1. ST elevation (STEMI) myocardial infarction involving left anterior descending  coronary artery -EKG with anterior ST elevation and AL ST elevation    Ref. Range 12/16/2013 09:10 12/16/2013 15:30 12/16/2013 21:04 12/17/2013 02:13  CK, MB Latest Range: 0.3-4.0 ng/mL 11.1 (HH)     CK Total Latest Range: 7-177 U/L 112     Troponin I Latest Range: <0.30 ng/mL 2.94 (HH) 10.91 (HH) <0.30 3.48 (HH)   -s/p PCI (3.0 x 24 mm Promus Premier DES) of the LAD for total occlusion after 1st septal and diagonal vessel.  The left anterior descending artery gave rise to a proximal diagonal vessel and large septal perforating artery. It was 20% narrowing at the ostium of the diagonal vessel and 70% ostial narrowing in this large septal perforating artery. The LAD just beyond the septal perforating artery was totally occluded and there was TIMI 0 flow.  She also has significant mid RCA stenosis (80-90%) and a dominant vessel.  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  -Brilinta 90 mg bid, Heparin gtt, Asprin 81 mg, ACEI, BB, morphine 2 mg q2 prn pain, NTG gtt, prn NTG SL, prn Zofran  -She will go back to cath lab 2/6 to re look at LAD and plan PCI RCA    2.   Diabetes type 2, controlled -HA1C 6.5  -She was on Metformin 500 mg bid  -monitor cbg, SSI-S  3. Hyperlipemia Lipid Panel     Component Value Date/Time   CHOL 123 12/17/2013 0213   TRIG 106 12/17/2013 0213   HDL 39* 12/17/2013 0213   CHOLHDL 3.2 12/17/2013 0213   VLDL 21 12/17/2013 0213   LDLCALC 63 12/17/2013 0213   Continue statin. On Pravachol 80 at home   4. Essential hypertension, benign -On HCTZ 25 mg at home, Lisinopril 5 mg qd, Toprol XL 200 mg  -On lisinopril, Toprol XL 200 mg   5. F/E/N -NS 125 cc/hr  -HypoK resolved  -NPO  6. DVT px  -Brilinta        LOS: 1 day   Cresenciano Genre, MD [phone] 12/17/2013, 7:28 AM

## 2013-12-17 NOTE — Interval H&P Note (Signed)
History and Physical Interval Note:  12/17/2013 2:57 PM  Elizabeth Aguilar  has presented today for surgery, with the diagnosis of stemi  The various methods of treatment have been discussed with the patient and family. After consideration of risks, benefits and other options for treatment, the patient has consented to  Procedure(s): PERCUTANEOUS CORONARY STENT INTERVENTION (PCI-S) (N/A) CORONARY ANGIOGRAM as a surgical intervention .  The patient's history has been reviewed, patient examined, no change in status, stable for surgery.  I have reviewed the patient's chart and labs.  Questions were answered to the patient's satisfaction.     KELLY,THOMAS A

## 2013-12-17 NOTE — CV Procedure (Addendum)
Elizabeth Aguilar is a 73 y.o. female    [id number]  [id number] LOCATION:  FACILITY: [facility]  PHYSICIAN: Troy Sine, MD, [facility] 06-12-1941   DATE OF PROCEDURE:  12/17/2013    CARDIAC CATHETERIZATION/PERCUTANEOUS CORONARY INTERVENTION     HISTORY:  Ms. Rebecah Dangerfield is a 49 -year-old female with a history of diabetes mellitus, and hyperlipidemia. She presented to [facility] yesterday in transfer from [facility] in the setting of an ST segment elevation anterior myocardial infarction. She had been experiencing chest pain for 3 days. Acute catheterization was done which showed poor LAD occlusion after a proximal septal and diagonal vessel which was successfully intervened upon. She did have mild haziness just proximal to the stent in the region of the septal takeoff. She also was noted to have 80-90% midright coronary artery stenosis. She had significant wall motion abnormality involving the mid distal LAD territory with severe hypokinesis. Because of her high grade concomitant CAD involving her RCA she is now brought back to the catheterization laboratory to assess her left coronary system, left ventricular pressures, and perform percutaneous coronary intervention to the right coronary artery.   PROCEDURE:  The patient was brought to the second floor [facility] cath lab in the postabsorptive state. She was premedicated with Versed initially 1 mg and fentanyl 25 mcg but required additional repeat dosing x1. Right femoral artery was punctured anteriorly and a 6 French sheath was inserted without difficulty. A 5 French diagnostic FL4 catheter was used for selective angiography into the left coronary system which confirmed a widely patent LAD stent and with resolution of the previous haziness proximal to the stented segment in the region of the septal perforating artery. A 6 French Right Guide catheter was then inserted and this was advanced into the left ventricle for recording of left  ventricular end-diastolic pressure and LV to AO pullback. This revealed that the LVEDP was low at 9 mm and consequently the patient's fluids were increased to 100 cc per hour. Selective angiography of the right coronary artery again confirmed a 90% stenosis in the midsegment but there was narrowing proximal and just beyond this focal stenosis. For this reason it was felt that a longer stent would be necessary to cover the entire region. Angiomax bolus plus infusion was administered. The patient had been started on brilinta yesterday and had received her morning dose of 90 mg already today. An ACT was obtained and was documented to be therapeutic.  A prowater wire was advanced down the right coronary artery. Predilatation was done with a 2.5x15 mm Trek balloon at 6 and 8 atmospheres. A 3.0 x 24 mm Promus Premier DES stent was then inserted and to cover the entire region of plaque. This was dilated x2 at 13 and 14 atmospheres. A 3.25x20 mm [place] Trek balloon was used for post stent dilatation up to 3.26 mm. Scout angiography confirmed an excellent angiographic result.TIMI-3 flow. There is no evidence for dissection  HEMODYNAMICS:   Central Aorta: 125/60   Left Ventricle: 125/9  ANGIOGRAPHY:  Left main coronary artery is radiographically normal and bifurcated into the LAD and left circumflex vessel.  The LAD was a moderate size vessel that extended to and wrapped around the LV apex. The stented proximal LAD after the first diagonal and septal perforator artery is widely patent. The previous haziness that was present in the region of the septal perforating artery is now completely resolved. There was no evidence for stenosis proximal to the stented segment.  The left circumflex coronary artery was angiographic normal and gave rise to one major circumflex vessel is unchanged.  The right coronary was a large caliber dominant vessel that had focal mid 90% stenosis. There was mild to moderate plaque proximal to  this focal stenosis and slightly beyond the stenosis. Following predilatation and stenting with a 3.0 x 24 mm Promus Premier DES stent post dilated to 3.26 mm, the entire stenosis was reduced to 0%. There was brisk TIMI-3 flow. There is no evidence for dissection.    IMPRESSION:  Mild volume depletion with LVEDP 9 mm  Widely patent LAD stent in this patient status post ST segment elevation anterior MI yesterday treated with acute PTCA stenting of a totally occluded  LAD with 100% occlusion reduced to 0% and with resolution of prior thrombus.  High-grade focal right coronary artery stenosis of 90% treated with successful PTCA/stenting with open insertion of a 3.0x24 mm Promus Premier DES stent postdilated to 3.26 mm  Patient is on brilinta/ASA for dual antiplatelet therapy and received Angiomax in addition to IV nitroglycerin and increasing intravenous fluids during the procedure  Troy Sine, MD, [facility] 12/17/2013 4:24 PM

## 2013-12-17 NOTE — H&P (View-Only) (Signed)
   I have seen and evaluated the patient this AM along with Dr. Verdia Kuba. I agree with her findings, examination as well as impression recommendations.  73 y/o woman presented with Anterior (Anterolateral STEMI) yesterday AM - PCI to mLAD with Promus P DES 3.0x24; also noted to have severe RCA lesion.    With significantly reduced LVEF & sevre RCA lesion, plan is staged PCI of RCA today.  Will continue BB & ACE-I as well as statin. DAPT - ASA, Brilinta.  DM SSI.  PCI to RCA per DR. TK today.   Leonie Man, M.D., M.S. [facility] [address] [place], [place]  [address]  [phone] Pager # [phone] 12/17/2013 9:04 AM

## 2013-12-17 NOTE — Progress Notes (Signed)
   I have seen and evaluated the patient this AM along with Dr. T McLean. I agree with her findings, examination as well as impression recommendations.  72 y/o woman presented with Anterior (Anterolateral STEMI) yesterday AM - PCI to mLAD with Promus P DES 3.0x24; also noted to have severe RCA lesion.    With significantly reduced LVEF & sevre RCA lesion, plan is staged PCI of RCA today.  Will continue BB & ACE-I as well as statin. DAPT - ASA, Brilinta.  DM SSI.  PCI to RCA per DR. TK today.   HARDING,DAVID W, M.D., M.S. [facility] [address] [place], [place]  [address]  [phone] Pager # [phone] 12/17/2013 9:04 AM     

## 2013-12-17 NOTE — Progress Notes (Signed)
ANTICOAGULATION CONSULT NOTE - Initial Consult  Pharmacy Consult for heparin Indication: ACS s/p Cath  Allergies  Allergen Reactions  . Actos [Pioglitazone] Swelling  . Ceftin [Cefuroxime Axetil] Nausea Only  . Celebrex [Celecoxib]     Sickness  . Darvocet [Propoxyphene N-Acetaminophen] Nausea Only  . Doxycycline   . Erythromycin   . Vioxx [Rofecoxib]     Sickness   . Zithromax [Azithromycin] Nausea Only  . Zocor [Simvastatin]     Side effects    Patient Measurements: Height: 5\' 3"  (160 cm) Weight: 158 lb 3.2 oz (71.759 kg) IBW/kg (Calculated) : 52.4 Heparin Dosing Weight: 67 kg  Vital Signs: Temp: 97.9 F (36.6 C) (02/06 0800) Temp src: Oral (02/06 0800) BP: 137/70 mmHg (02/06 0800) Pulse Rate: 93 (02/06 0800)  Labs:  Recent Labs  12/16/13 0902  12/16/13 0910 12/16/13 1530 12/16/13 2104 12/17/13 0213 12/17/13 0645  HGB 12.6  --  11.1*  --   --  10.5*  --   HCT 37.0  --  33.1*  --   --  31.5*  --   PLT  --   --  302  --   --  269  --   APTT  --   --  26  --   --   --   --   LABPROT  --   --  12.6  --   --   --   --   INR  --   --  0.96  --   --   --   --   HEPARINUNFRC  --   --   --   --   --   --  0.17*  CREATININE 1.00  --  0.95  --   --  0.94  --   CKTOTAL  --   --  112  --   --   --   --   CKMB  --   --  11.1*  --   --   --   --   TROPONINI  --   < > 2.94* 10.91* <0.30 3.48*  --   < > = values in this interval not displayed.  Estimated Creatinine Clearance: 50.7 ml/min (by C-G formula based on Cr of 0.94).   Medical History: Past Medical History  Diagnosis Date  . Bronchitis   . Irritable bowel syndrome   . Polyarthralgia 1998  . Tachycardia   . Prediabetes   . Hyperlipidemia   . Type 2 diabetes mellitus   . Arthralgia     Medications:  Prescriptions prior to admission  Medication Sig Dispense Refill  . ALPRAZolam (XANAX) 0.5 MG tablet Take one BID prn  60 tablet  3  . amoxicillin (AMOXIL) 500 MG tablet Take 1 tablet (500 mg total) by  mouth 3 (three) times daily.  30 tablet  0  . cyclobenzaprine (FLEXERIL) 10 MG tablet Take 1 tablet (10 mg total) by mouth at bedtime as needed for muscle spasms.  30 tablet  6  . esomeprazole (NEXIUM) 40 MG capsule Take 1 capsule (40 mg total) by mouth daily before breakfast.  30 capsule  6  . fluticasone (FLONASE) 50 MCG/ACT nasal spray Place 2 sprays into the nose daily.  16 g  6  . hydrochlorothiazide (HYDRODIURIL) 25 MG tablet Take 0.5 tablets (12.5 mg total) by mouth daily.  30 tablet  6  . HYDROcodone-acetaminophen (NORCO/VICODIN) 5-325 MG per tablet Take 1 tablet by mouth 3 (three) times daily as needed.      [name]  ketorolac (ACULAR) 0.5 % ophthalmic solution Place 1 drop into both eyes 4 (four) times daily.      [name] lisinopril (PRINIVIL,ZESTRIL) 5 MG tablet Take 1 tablet (5 mg total) by mouth daily.  30 tablet  3  . loratadine (CLARITIN) 10 MG tablet Take 1 tablet (10 mg total) by mouth daily.  30 tablet  5  . meloxicam (MOBIC) 7.5 MG tablet Take 1 tablet (7.5 mg total) by mouth daily.  30 tablet  4  . metFORMIN (GLUCOPHAGE) 500 MG tablet Take 1 tablet (500 mg total) by mouth 2 (two) times daily with a meal.  60 tablet  6  . metoprolol (TOPROL-XL) 200 MG 24 hr tablet Take 1 tablet (200 mg total) by mouth daily.  30 tablet  3  . Multiple Vitamin (MULTIVITAMIN) tablet Take 1 tablet by mouth daily.      . pravastatin (PRAVACHOL) 80 MG tablet Take 1 tablet (80 mg total) by mouth daily.  30 tablet  6  . vitamin C (ASCORBIC ACID) 500 MG tablet Take 500 mg by mouth daily.      . [DISCONTINUED] HYDROcodone-acetaminophen (NORCO/VICODIN) 5-325 MG per tablet One tid prn  90 tablet  0  . [DISCONTINUED] ketorolac (ACULAR) 0.4 % SOLN Place 1 drop into both eyes 4 (four) times daily.  1 Bottle  6   Scheduled:  . [START ON 12/18/2013] aspirin EC  81 mg Oral Daily  . insulin aspart  0-9 Units Subcutaneous TID WC  . ketorolac  1 drop Both Eyes QID  . lisinopril  5 mg Oral Daily  . metoprolol  200 mg Oral Daily   . pantoprazole  40 mg Oral Daily  . pravastatin  80 mg Oral Daily  . sodium chloride  3 mL Intravenous Q12H  . Ticagrelor  90 mg Oral BID    Assessment: 73 yo admitted with STEMI s/p cath to start heparin 6 hours post sheath removal. HL today SUBtherapeutic at 0.17. H/H slightly low, plt wnl.   Goal of Therapy:  Heparin level 0.3-0.7 units/ml Monitor platelets by anticoagulation protocol: Yes   Plan:  - Increase Heparin to 1000 units/hr (increase by 3-4 units/kg/hr) - HL in 8 hrs  - F/u after cath today    Harolyn Rutherford, PharmD Clinical Pharmacist - Resident Pager: [phone] Pharmacy: [phone] 12/17/2013 8:20 AM

## 2013-12-17 NOTE — Progress Notes (Signed)
Post void residual checked with bladder scanner. Approximately 200cc of urine remains in bladder. Dr Aundra Dubin (resident with Dr Ellyn Hack) notified.  Loni Muse, RN

## 2013-12-17 NOTE — H&P (View-Only) (Signed)
ANTICOAGULATION CONSULT NOTE - Initial Consult  Pharmacy Consult for heparin Indication: ACS s/p Cath  Allergies  Allergen Reactions  . Actos [Pioglitazone] Swelling  . Ceftin [Cefuroxime Axetil] Nausea Only  . Celebrex [Celecoxib]     Sickness  . Darvocet [Propoxyphene N-Acetaminophen] Nausea Only  . Doxycycline   . Erythromycin   . Vioxx [Rofecoxib]     Sickness   . Zithromax [Azithromycin] Nausea Only  . Zocor [Simvastatin]     Side effects    Patient Measurements: Height: 5\' 3"  (160 cm) Weight: 158 lb 3.2 oz (71.759 kg) IBW/kg (Calculated) : 52.4 Heparin Dosing Weight: 67 kg  Vital Signs: Temp: 97.9 F (36.6 C) (02/06 0800) Temp src: Oral (02/06 0800) BP: 137/70 mmHg (02/06 0800) Pulse Rate: 93 (02/06 0800)  Labs:  Recent Labs  12/16/13 0902  12/16/13 0910 12/16/13 1530 12/16/13 2104 12/17/13 0213 12/17/13 0645  HGB 12.6  --  11.1*  --   --  10.5*  --   HCT 37.0  --  33.1*  --   --  31.5*  --   PLT  --   --  302  --   --  269  --   APTT  --   --  26  --   --   --   --   LABPROT  --   --  12.6  --   --   --   --   INR  --   --  0.96  --   --   --   --   HEPARINUNFRC  --   --   --   --   --   --  0.17*  CREATININE 1.00  --  0.95  --   --  0.94  --   CKTOTAL  --   --  112  --   --   --   --   CKMB  --   --  11.1*  --   --   --   --   TROPONINI  --   < > 2.94* 10.91* <0.30 3.48*  --   < > = values in this interval not displayed.  Estimated Creatinine Clearance: 50.7 ml/min (by C-G formula based on Cr of 0.94).   Medical History: Past Medical History  Diagnosis Date  . Bronchitis   . Irritable bowel syndrome   . Polyarthralgia 1998  . Tachycardia   . Prediabetes   . Hyperlipidemia   . Type 2 diabetes mellitus   . Arthralgia     Medications:  Prescriptions prior to admission  Medication Sig Dispense Refill  . ALPRAZolam (XANAX) 0.5 MG tablet Take one BID prn  60 tablet  3  . amoxicillin (AMOXIL) 500 MG tablet Take 1 tablet (500 mg total) by  mouth 3 (three) times daily.  30 tablet  0  . cyclobenzaprine (FLEXERIL) 10 MG tablet Take 1 tablet (10 mg total) by mouth at bedtime as needed for muscle spasms.  30 tablet  6  . esomeprazole (NEXIUM) 40 MG capsule Take 1 capsule (40 mg total) by mouth daily before breakfast.  30 capsule  6  . fluticasone (FLONASE) 50 MCG/ACT nasal spray Place 2 sprays into the nose daily.  16 g  6  . hydrochlorothiazide (HYDRODIURIL) 25 MG tablet Take 0.5 tablets (12.5 mg total) by mouth daily.  30 tablet  6  . HYDROcodone-acetaminophen (NORCO/VICODIN) 5-325 MG per tablet Take 1 tablet by mouth 3 (three) times daily as needed.      [name]  ketorolac (ACULAR) 0.5 % ophthalmic solution Place 1 drop into both eyes 4 (four) times daily.      . lisinopril (PRINIVIL,ZESTRIL) 5 MG tablet Take 1 tablet (5 mg total) by mouth daily.  30 tablet  3  . loratadine (CLARITIN) 10 MG tablet Take 1 tablet (10 mg total) by mouth daily.  30 tablet  5  . meloxicam (MOBIC) 7.5 MG tablet Take 1 tablet (7.5 mg total) by mouth daily.  30 tablet  4  . metFORMIN (GLUCOPHAGE) 500 MG tablet Take 1 tablet (500 mg total) by mouth 2 (two) times daily with a meal.  60 tablet  6  . metoprolol (TOPROL-XL) 200 MG 24 hr tablet Take 1 tablet (200 mg total) by mouth daily.  30 tablet  3  . Multiple Vitamin (MULTIVITAMIN) tablet Take 1 tablet by mouth daily.      . pravastatin (PRAVACHOL) 80 MG tablet Take 1 tablet (80 mg total) by mouth daily.  30 tablet  6  . vitamin C (ASCORBIC ACID) 500 MG tablet Take 500 mg by mouth daily.      . [DISCONTINUED] HYDROcodone-acetaminophen (NORCO/VICODIN) 5-325 MG per tablet One tid prn  90 tablet  0  . [DISCONTINUED] ketorolac (ACULAR) 0.4 % SOLN Place 1 drop into both eyes 4 (four) times daily.  1 Bottle  6   Scheduled:  . [START ON 12/18/2013] aspirin EC  81 mg Oral Daily  . insulin aspart  0-9 Units Subcutaneous TID WC  . ketorolac  1 drop Both Eyes QID  . lisinopril  5 mg Oral Daily  . metoprolol  200 mg Oral Daily   . pantoprazole  40 mg Oral Daily  . pravastatin  80 mg Oral Daily  . sodium chloride  3 mL Intravenous Q12H  . Ticagrelor  90 mg Oral BID    Assessment: 72 yo admitted with STEMI s/p cath to start heparin 6 hours post sheath removal. HL today SUBtherapeutic at 0.17. H/H slightly low, plt wnl.   Goal of Therapy:  Heparin level 0.3-0.7 units/ml Monitor platelets by anticoagulation protocol: Yes   Plan:  - Increase Heparin to 1000 units/hr (increase by 3-4 units/kg/hr) - HL in 8 hrs  - F/u after cath today    Laurita Peron K. von [place], PharmD Clinical Pharmacist - Resident Pager: [phone] Pharmacy: [phone] 12/17/2013 8:20 AM       

## 2013-12-17 NOTE — Progress Notes (Signed)
EKG CRITICAL VALUE     12 lead EKG performed.  Critical value noted.  Jess Barters, RN notified.   Tabor Denham L, CCT 12/17/2013 4:43 PM

## 2013-12-17 NOTE — Interval H&P Note (Signed)
Cath Lab Visit (complete for each Cath Lab visit)  Clinical Evaluation Leading to the Procedure:   ACS: yes  Non-ACS:    Anginal Classification: CCS IV  Anti-ischemic medical therapy: Maximal Therapy (2 or more classes of medications)  Non-Invasive Test Results: No non-invasive testing performed  Prior CABG: No previous CABG      History and Physical Interval Note:  12/17/2013 2:58 PM  Elizabeth Aguilar  has presented today for surgery, with the diagnosis of stemi  The various methods of treatment have been discussed with the patient and family. After consideration of risks, benefits and other options for treatment, the patient has consented to  Procedure(s): PERCUTANEOUS CORONARY STENT INTERVENTION (PCI-S) (N/A) CORONARY ANGIOGRAM as a surgical intervention .  The patient's history has been reviewed, patient examined, no change in status, stable for surgery.  I have reviewed the patient's chart and labs.  Questions were answered to the patient's satisfaction.     KELLY,THOMAS A

## 2013-12-18 DIAGNOSIS — I472 Ventricular tachycardia: Secondary | ICD-10-CM | POA: Diagnosis not present

## 2013-12-18 DIAGNOSIS — I2589 Other forms of chronic ischemic heart disease: Secondary | ICD-10-CM

## 2013-12-18 DIAGNOSIS — I059 Rheumatic mitral valve disease, unspecified: Secondary | ICD-10-CM | POA: Diagnosis not present

## 2013-12-18 DIAGNOSIS — E785 Hyperlipidemia, unspecified: Secondary | ICD-10-CM | POA: Diagnosis not present

## 2013-12-18 DIAGNOSIS — E119 Type 2 diabetes mellitus without complications: Secondary | ICD-10-CM | POA: Diagnosis not present

## 2013-12-18 DIAGNOSIS — I2109 ST elevation (STEMI) myocardial infarction involving other coronary artery of anterior wall: Secondary | ICD-10-CM | POA: Diagnosis not present

## 2013-12-18 LAB — CBC
HEMATOCRIT: 31 % — AB (ref 36.0–46.0)
Hemoglobin: 10.6 g/dL — ABNORMAL LOW (ref 12.0–15.0)
MCH: 30.5 pg (ref 26.0–34.0)
MCHC: 34.2 g/dL (ref 30.0–36.0)
MCV: 89.1 fL (ref 78.0–100.0)
Platelets: 269 10*3/uL (ref 150–400)
RBC: 3.48 MIL/uL — AB (ref 3.87–5.11)
RDW: 13.7 % (ref 11.5–15.5)
WBC: 9.4 10*3/uL (ref 4.0–10.5)

## 2013-12-18 LAB — BASIC METABOLIC PANEL
BUN: 14 mg/dL (ref 6–23)
CHLORIDE: 106 meq/L (ref 96–112)
CO2: 23 meq/L (ref 19–32)
Calcium: 8.5 mg/dL (ref 8.4–10.5)
Creatinine, Ser: 0.97 mg/dL (ref 0.50–1.10)
GFR calc Af Amer: 66 mL/min — ABNORMAL LOW (ref >90)
GFR, EST NON AFRICAN AMERICAN: 57 mL/min — AB (ref >90)
GLUCOSE: 99 mg/dL (ref 70–99)
POTASSIUM: 3.6 meq/L — AB (ref 3.7–5.3)
Sodium: 144 mEq/L (ref 137–147)

## 2013-12-18 LAB — GLUCOSE, CAPILLARY
GLUCOSE-CAPILLARY: 106 mg/dL — AB (ref 70–99)
GLUCOSE-CAPILLARY: 140 mg/dL — AB (ref 70–99)
Glucose-Capillary: 102 mg/dL — ABNORMAL HIGH (ref 70–99)
Glucose-Capillary: 119 mg/dL — ABNORMAL HIGH (ref 70–99)

## 2013-12-18 LAB — MAGNESIUM: Magnesium: 1.5 mg/dL (ref 1.5–2.5)

## 2013-12-18 MED ORDER — CHLORHEXIDINE GLUCONATE CLOTH 2 % EX PADS
6.0000 | MEDICATED_PAD | Freq: Every day | CUTANEOUS | Status: DC
  Administered 2013-12-18 – 2013-12-20 (×3): 6 via TOPICAL

## 2013-12-18 MED ORDER — SPIRONOLACTONE 12.5 MG HALF TABLET
12.5000 mg | ORAL_TABLET | Freq: Every day | ORAL | Status: DC
  Administered 2013-12-18 – 2013-12-20 (×3): 12.5 mg via ORAL
  Filled 2013-12-18 (×3): qty 1

## 2013-12-18 MED ORDER — ATORVASTATIN CALCIUM 40 MG PO TABS
40.0000 mg | ORAL_TABLET | Freq: Every day | ORAL | Status: DC
  Administered 2013-12-18 – 2013-12-19 (×2): 40 mg via ORAL
  Filled 2013-12-18 (×3): qty 1

## 2013-12-18 MED ORDER — MAGNESIUM SULFATE 40 MG/ML IJ SOLN
2.0000 g | Freq: Once | INTRAMUSCULAR | Status: AC
  Administered 2013-12-18: 2 g via INTRAVENOUS
  Filled 2013-12-18: qty 50

## 2013-12-18 MED ORDER — PERFLUTREN LIPID MICROSPHERE
INTRAVENOUS | Status: AC
  Administered 2013-12-18: 5 mL
  Filled 2013-12-18: qty 10

## 2013-12-18 MED ORDER — POTASSIUM CHLORIDE 20 MEQ/15ML (10%) PO LIQD
40.0000 meq | Freq: Once | ORAL | Status: AC
  Administered 2013-12-18: 40 meq via ORAL
  Filled 2013-12-18: qty 30

## 2013-12-18 MED ORDER — MUPIROCIN 2 % EX OINT
1.0000 "application " | TOPICAL_OINTMENT | Freq: Two times a day (BID) | CUTANEOUS | Status: DC
  Administered 2013-12-18 – 2013-12-20 (×5): 1 via NASAL
  Filled 2013-12-18: qty 22

## 2013-12-18 MED ORDER — ATORVASTATIN CALCIUM 80 MG PO TABS: 80.0000 mg | ORAL_TABLET | Freq: Every day | ORAL | Status: DC

## 2013-12-18 NOTE — Progress Notes (Signed)
Central Tele reported patient had 5 beats of V/Tach, patient asymptomatic B/P 136/63 HR 106 RR 20 02 Sat 99 on 1 LNP. Pt denies pain or discomfort, no SOB  no C/Pain. Pt is presently  resting comfortable in bed and drinking  a can of Coke. We will continue to monitor.

## 2013-12-18 NOTE — Progress Notes (Signed)
Subjective: Pt denies chest pain, denies pain at cath site.  She has resolved h/a.   Objective: Vital signs in last 24 hours: Filed Vitals:   12/18/13 0300 12/18/13 0400 12/18/13 0500 12/18/13 0600  BP: 114/43 121/51 123/33 122/54  Pulse: 87 88 96 91  Temp:  97.9 F (36.6 C)    TempSrc:  Oral    Resp: 17 17 17 19   Height:      Weight:   156 lb 4.9 oz (70.9 kg)   SpO2: 96% 98% 97% 98%   Weight change: -1 lb 12.2 oz (-0.8 kg)  Intake/Output Summary (Last 24 hours) at 12/18/13 0656 Last data filed at 12/18/13 0600  Gross per 24 hour  Intake 1427.21 ml  Output   1670 ml  Net -242.79 ml   Vitals reviewed. 102, 98% on 2L 19 146/67 (90) General: resting in bed, NAD HEENT: [place]/at, no scleral icterus Cardiac: RRR, no rubs, murmurs or gallops Pulm: clear to auscultation bilaterally anteriorly  Abd: soft, nontender, nondistended, BS present Ext: warm and well perfused, no pedal edema Neuro: alert and oriented X3, cranial nerves II-XII grossly intact Cath site: right groin. Non bleeding/no hematoma/bruit.   Lab Results: Basic Metabolic Panel:  Recent Labs Lab 12/17/13 0213 12/18/13 0250  NA 142 144  K 3.7 3.6*  CL 105 106  CO2 22 23  GLUCOSE 109* 99  BUN 16 14  CREATININE 0.94 0.97  CALCIUM 8.6 8.5  MG  --  1.5   Liver Function Tests:  Recent Labs Lab 12/16/13 0910  AST 25  ALT 12  ALKPHOS 96  BILITOT 0.2*  PROT 6.4  ALBUMIN 3.1*   CBC:  Recent Labs Lab 12/16/13 0902  12/16/13 0910 12/17/13 0213 12/18/13 0250  WBC  --   < > 12.5* 9.0 9.4  NEUTROABS  --   --  9.8*  --   --   HGB 12.6  --  11.1* 10.5* 10.6*  HCT 37.0  --  33.1* 31.5* 31.0*  MCV  --   < > 90.7 89.7 89.1  PLT  --   < > 302 269 269  < > = values in this interval not displayed. Cardiac Enzymes:  Recent Labs Lab 12/16/13 0910 12/16/13 1530 12/16/13 2104 12/17/13 0213  CKTOTAL 112  --   --   --   CKMB 11.1*  --   --   --   TROPONINI 2.94* 10.91* <0.30 3.48*   Hemoglobin  A1C:  Recent Labs Lab 12/16/13 0910  HGBA1C 6.5*   Fasting Lipid Panel:  Recent Labs Lab 12/17/13 0213  CHOL 123  HDL 39*  LDLCALC 63  TRIG 106  CHOLHDL 3.2   Thyroid Function Tests:  Recent Labs Lab 12/16/13 1530  TSH 1.878   Coagulation:  Recent Labs Lab 12/16/13 0910  LABPROT 12.6  INR 0.96   Misc. Labs: none  Micro Results: Recent Results (from the past 240 hour(s))  MRSA PCR SCREENING     Status: None   Collection Time    12/16/13 11:13 AM      Result Value Range Status   MRSA by PCR NEGATIVE  NEGATIVE Final   Comment:            The GeneXpert MRSA Assay (FDA     approved for NASAL specimens     only), is one component of a     comprehensive MRSA colonization     surveillance program. It is not  intended to diagnose MRSA     infection nor to guide or     monitor treatment for     MRSA infections.   Studies/Results: No results found. Medications:  Scheduled Meds: . aspirin EC  81 mg Oral Daily  . insulin aspart  0-9 Units Subcutaneous TID WC  . ketorolac  1 drop Both Eyes QID  . lisinopril  5 mg Oral Daily  . magnesium sulfate 1 - 4 g bolus IVPB  2 g Intravenous Once  . metoprolol  200 mg Oral Daily  . pantoprazole  40 mg Oral Daily  . potassium chloride  40 mEq Oral Once  . pravastatin  80 mg Oral Daily  . Ticagrelor  90 mg Oral BID   Continuous Infusions: . sodium chloride 10 mL/hr at 12/16/13 2200  . sodium chloride 70 mL/hr at 12/18/13 0000  . nitroGLYCERIN Stopped (12/18/13 0000)   PRN Meds:.acetaminophen, acetaminophen, ALPRAZolam, morphine injection, nitroGLYCERIN, ondansetron (ZOFRAN) IV, ondansetron (ZOFRAN) IV Assessment/Plan: 73 y.o PMH bronchitis, IBS, poor dentition, diabetes, HTN, HLD, polyarthragia presented with chest pain x 3 days found to have STEMI 12/16/13 s/p cath 12/16/13.     1. ST elevation (STEMI) myocardial infarction involving total LAD coronary artery occlusion and significant mid RCA stenosis  (80-90%) -initial EKG with anterior ST elevation and AL ST elevation  -s/p PCI (3.0 x 24 mm Promus Premier DES) of the LAD for total occlusion after 1st septal and diagonal vessel.  The left anterior descending artery gave rise to a proximal diagonal vessel and large septal perforating artery. It was 20% narrowing at the ostium of the diagonal vessel and 70% ostial narrowing in this large septal perforating artery. The LAD just beyond the septal perforating artery was totally occluded and there was TIMI 0 flow.  She also has significant mid RCA stenosis (80-90%) and a dominant vessel.  LVEDP was 9 mm.  RCA s/p 3.0 x 24 mm Promeus Premeir DES.  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  -Brilinta 90 mg bid, Asprin 81 mg, ACEI, BB, morphine 2 mg q2 prn pain, NTG gtt (off), prn NTG SL, prn Zofran    2.   Diabetes type 2, controlled -HA1C 6.5  -She was on Metformin 500 mg bid-hold x 48 hours  -monitor cbg, SSI-S  3. Hyperlipemia Lipid Panel     Component Value Date/Time   CHOL 123 12/17/2013 0213   TRIG 106 12/17/2013 0213   HDL 39* 12/17/2013 0213   CHOLHDL 3.2 12/17/2013 0213   VLDL 21 12/17/2013 0213   LDLCALC 63 12/17/2013 0213   Continue statin. On Pravachol 80 at home   4. Essential hypertension, benign -On HCTZ 25 mg at home, Lisinopril 5 mg qd, Toprol XL 200 mg  -On lisinopril, Toprol XL 200 mg   5. F/E/N -NS 125 cc/hr  -HypoK 3.6 replaced 40 meQ x 1, Mag 1.5 given 2 grams  -clear liq, prob advance as tolerated today  6. DVT px  -Brilinta     LOS: 2 days   Cresenciano Genre, MD [phone] 12/18/2013, 6:56 AM  Patient seen with resident, agree with the above note.  Patient doing well this morning s/p staged PCI to RCA.  She had EF 35-40% on LV-gram.  Awaiting echo. Right groin cath site ok.  - s/p anterior STEMI with LAD and RCA PCIs, on ASA/Brilinta.  - Will stop pravastatin and try her on atorvastatin 40.  She had side effects with simvastatin.  -  Echo  today hopefully.  If EF < 35%, would consider Lifevest.  - Add spironolactone 12.5 mg daily. Continue Toprol XL and lisinopril.  - Up to walk around.  - Stop IV fluid.  - May transfer to telemetry, home on Sunday if stable.   Loralie Champagne 12/18/2013 7:46 AM

## 2013-12-18 NOTE — Progress Notes (Signed)
CARDIAC REHAB PHASE I   PRE:  Rate/Rhythm: 96  BP:  Sitting: 130/62    SaO2: 97% RA  MODE:  Ambulation: 300 ft   POST:  Rate/Rhythm: 116  BP:  Sitting: 137/61    SaO2: 97% RA  8:40am-9:25am  Patient tolerated walk well.  She walked slow but steady with assistance from the walker.  She has no complaints and stated that it felt good to get up and walk. She is interested in cardiac rehab at [facility].   [facility], [place] 12/18/2013 9:17 AM

## 2013-12-18 NOTE — Progress Notes (Signed)
  Echocardiogram 2D Echocardiogram with Definity has been performed.  [name] 12/18/2013, 11:07 AM

## 2013-12-18 NOTE — Progress Notes (Signed)
Right femoral sheath pulled, manual pressure held to site for 20 minutes. Site soft, no hematoma with palpable pulses. Pressure drsg applied to site. Post cath instructions given to pt, pt verbalized understanding. Vss. Pt on of bedrest for 4h. Will monitor closely.

## 2013-12-19 DIAGNOSIS — I2109 ST elevation (STEMI) myocardial infarction involving other coronary artery of anterior wall: Secondary | ICD-10-CM | POA: Diagnosis not present

## 2013-12-19 LAB — BASIC METABOLIC PANEL
BUN: 17 mg/dL (ref 6–23)
CALCIUM: 8.7 mg/dL (ref 8.4–10.5)
CO2: 19 mEq/L (ref 19–32)
CREATININE: 0.94 mg/dL (ref 0.50–1.10)
Chloride: 105 mEq/L (ref 96–112)
GFR calc Af Amer: 68 mL/min — ABNORMAL LOW (ref >90)
GFR calc non Af Amer: 59 mL/min — ABNORMAL LOW (ref >90)
Glucose, Bld: 116 mg/dL — ABNORMAL HIGH (ref 70–99)
Potassium: 3.9 mEq/L (ref 3.7–5.3)
Sodium: 138 mEq/L (ref 137–147)

## 2013-12-19 LAB — CBC
HCT: 32.3 % — ABNORMAL LOW (ref 36.0–46.0)
Hemoglobin: 10.8 g/dL — ABNORMAL LOW (ref 12.0–15.0)
MCH: 30.2 pg (ref 26.0–34.0)
MCHC: 33.4 g/dL (ref 30.0–36.0)
MCV: 90.2 fL (ref 78.0–100.0)
PLATELETS: 273 10*3/uL (ref 150–400)
RBC: 3.58 MIL/uL — ABNORMAL LOW (ref 3.87–5.11)
RDW: 13.8 % (ref 11.5–15.5)
WBC: 10.2 10*3/uL (ref 4.0–10.5)

## 2013-12-19 LAB — GLUCOSE, CAPILLARY
GLUCOSE-CAPILLARY: 108 mg/dL — AB (ref 70–99)
Glucose-Capillary: 150 mg/dL — ABNORMAL HIGH (ref 70–99)
Glucose-Capillary: 111 mg/dL — ABNORMAL HIGH (ref 70–99)
Glucose-Capillary: 127 mg/dL — ABNORMAL HIGH (ref 70–99)

## 2013-12-19 LAB — MAGNESIUM: MAGNESIUM: 2.1 mg/dL (ref 1.5–2.5)

## 2013-12-19 NOTE — Progress Notes (Signed)
Patient ID: Elizabeth Aguilar, female   DOB: 05-Apr-1941, 73 y.o.   MRN: [id number]   Subjective: Pt denies chest pain, denies pain at cath site.  Ambulating in hall   Objective: Vital signs in last 24 hours: Filed Vitals:   12/18/13 1731 12/18/13 2100 12/18/13 2145 12/19/13 0500  BP: 137/61 144/80 136/63 132/64  Pulse: 101 108 106 96  Temp: 97.7 F (36.5 C) 98 F (36.7 C)  97.3 F (36.3 C)  TempSrc: Oral     Resp: 20 20  20   Height:      Weight:    154 lb 8.7 oz (70.1 kg)  SpO2: 98% 100% 99% 99%   Weight change: -1 lb 12.2 oz (-0.8 kg)  Intake/Output Summary (Last 24 hours) at 12/19/13 0831 Last data filed at 12/18/13 1200  Gross per 24 hour  Intake    480 ml  Output      0 ml  Net    480 ml   Vitals reviewed. 102, 98% on 2L 19 146/67 (90) General: resting in bed, NAD HEENT: [place]/at, no scleral icterus Cardiac: RRR, no rubs, murmurs or gallops Pulm: clear to auscultation bilaterally anteriorly  Abd: soft, nontender, nondistended, BS present Ext: warm and well perfused, no pedal edema Neuro: alert and oriented X3, cranial nerves II-XII grossly intact Cath site: right groin. Non bleeding/no hematoma/bruit.   Lab Results: Basic Metabolic Panel:  Recent Labs Lab 12/18/13 0250 12/19/13 0435  NA 144 138  K 3.6* 3.9  CL 106 105  CO2 23 19  GLUCOSE 99 116*  BUN 14 17  CREATININE 0.97 0.94  CALCIUM 8.5 8.7  MG 1.5 2.1   Liver Function Tests:  Recent Labs Lab 12/16/13 0910  AST 25  ALT 12  ALKPHOS 96  BILITOT 0.2*  PROT 6.4  ALBUMIN 3.1*   CBC:  Recent Labs Lab 12/16/13 0902 12/16/13 0910  12/18/13 0250 12/19/13 0435  WBC  --  12.5*  < > 9.4 10.2  NEUTROABS  --  9.8*  --   --   --   HGB 12.6 11.1*  < > 10.6* 10.8*  HCT 37.0 33.1*  < > 31.0* 32.3*  MCV  --  90.7  < > 89.1 90.2  PLT  --  302  < > 269 273  < > = values in this interval not displayed. Cardiac Enzymes:  Recent Labs Lab 12/16/13 0910 12/16/13 1530 12/16/13 2104 12/17/13 0213   CKTOTAL 112  --   --   --   CKMB 11.1*  --   --   --   TROPONINI 2.94* 10.91* <0.30 3.48*   Hemoglobin A1C:  Recent Labs Lab 12/16/13 0910  HGBA1C 6.5*   Fasting Lipid Panel:  Recent Labs Lab 12/17/13 0213  CHOL 123  HDL 39*  LDLCALC 63  TRIG 106  CHOLHDL 3.2   Thyroid Function Tests:  Recent Labs Lab 12/16/13 1530  TSH 1.878   Coagulation:  Recent Labs Lab 12/16/13 0910  LABPROT 12.6  INR 0.96   Misc. Labs: none  Micro Results: Studies/Results: No results found. Medications:  Scheduled Meds: . aspirin EC  81 mg Oral Daily  . atorvastatin  40 mg Oral q1800  . Chlorhexidine Gluconate Cloth  6 each Topical Q0600  . insulin aspart  0-9 Units Subcutaneous TID WC  . ketorolac  1 drop Both Eyes QID  . lisinopril  5 mg Oral Daily  . metoprolol  200 mg Oral Daily  . mupirocin ointment  1 application Nasal BID  . pantoprazole  40 mg Oral Daily  . spironolactone  12.5 mg Oral Daily  . Ticagrelor  90 mg Oral BID   Continuous Infusions: . nitroGLYCERIN Stopped (12/18/13 0000)   PRN Meds:.acetaminophen, acetaminophen, ALPRAZolam, morphine injection, nitroGLYCERIN, ondansetron (ZOFRAN) IV, ondansetron (ZOFRAN) IV Assessment/Plan: 73 y.o PMH bronchitis, IBS, poor dentition, diabetes, HTN, HLD, polyarthragia presented with chest pain x 3 days found to have STEMI 12/16/13 s/p cath 12/16/13.     1. 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  The left anterior descending artery gave rise to a proximal diagonal vessel and large septal perforating artery. It was 20% narrowing at the ostium of the diagonal vessel and 70% ostial narrowing in this large septal perforating artery. The LAD just beyond the septal  perforating artery was totally occluded and there was TIMI 0 flow.  She also has significant mid RCA stenosis (80-90%) and a dominant vessel.  LVEDP was 9 mm.  RCA s/p 3.0 x 24 mm Promeus Premeir DES.  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  -Brilinta 90 mg bid, Asprin 81 mg, ACEI, BB, morphine 2 mg q2 prn pain, NTG gtt (off), prn NTG SL, prn Zofran   Echo with EF 25%  Will keep in hospital and have EP arrange for lifevest  Discussed with patient  Would f/u with cardiac MRI in 3 months    2.   Diabetes type 2, controlled -HA1C 6.5  -She was on Metformin 500 mg resume now 48 hours after procedure  -monitor cbg, SSI-S  3. Hyperlipemia Lipid Panel     Component Value Date/Time   CHOL 123 12/17/2013 0213   TRIG 106 12/17/2013 0213   HDL 39* 12/17/2013 0213   CHOLHDL 3.2 12/17/2013 0213   VLDL 21 12/17/2013 0213   LDLCALC 63 12/17/2013 0213   Continue statin. On Pravachol 80 at home   4. Essential hypertension, benign -On HCTZ 25 mg at home, Lisinopril 5 mg qd, Toprol XL 200 mg  -On lisinopril, Toprol XL 200 mg   5. F/E/N -NS 125 cc/hr  -HypoK 3.6 replaced 40 meQ x 1, Mag 1.5 given 2 grams  -clear liq, prob advance as tolerated today  6. DVT px  -Brilinta     LOS: 3 days   Josue Hector, MD [phone] 12/19/2013, 8:31 AM

## 2013-12-20 ENCOUNTER — Other Ambulatory Visit: Payer: Self-pay | Admitting: Physician Assistant

## 2013-12-20 DIAGNOSIS — E119 Type 2 diabetes mellitus without complications: Secondary | ICD-10-CM | POA: Diagnosis not present

## 2013-12-20 DIAGNOSIS — I2510 Atherosclerotic heart disease of native coronary artery without angina pectoris: Secondary | ICD-10-CM | POA: Diagnosis not present

## 2013-12-20 DIAGNOSIS — I2109 ST elevation (STEMI) myocardial infarction involving other coronary artery of anterior wall: Secondary | ICD-10-CM | POA: Diagnosis not present

## 2013-12-20 DIAGNOSIS — I10 Essential (primary) hypertension: Secondary | ICD-10-CM | POA: Diagnosis not present

## 2013-12-20 DIAGNOSIS — Z9861 Coronary angioplasty status: Secondary | ICD-10-CM

## 2013-12-20 LAB — CBC
HEMATOCRIT: 33 % — AB (ref 36.0–46.0)
Hemoglobin: 11.2 g/dL — ABNORMAL LOW (ref 12.0–15.0)
MCH: 30.2 pg (ref 26.0–34.0)
MCHC: 33.9 g/dL (ref 30.0–36.0)
MCV: 88.9 fL (ref 78.0–100.0)
Platelets: 323 10*3/uL (ref 150–400)
RBC: 3.71 MIL/uL — ABNORMAL LOW (ref 3.87–5.11)
RDW: 13.6 % (ref 11.5–15.5)
WBC: 10.5 10*3/uL (ref 4.0–10.5)

## 2013-12-20 LAB — GLUCOSE, CAPILLARY
GLUCOSE-CAPILLARY: 123 mg/dL — AB (ref 70–99)
GLUCOSE-CAPILLARY: 125 mg/dL — AB (ref 70–99)

## 2013-12-20 MED ORDER — ATORVASTATIN CALCIUM 80 MG PO TABS: 80.0000 mg | ORAL_TABLET | Freq: Every day | ORAL | Status: DC

## 2013-12-20 MED ORDER — ATORVASTATIN CALCIUM 40 MG PO TABS: 40.0000 mg | ORAL_TABLET | Freq: Every day | ORAL | Status: DC

## 2013-12-20 MED ORDER — NITROGLYCERIN 0.4 MG SL SUBL: 0.4000 mg | SUBLINGUAL_TABLET | SUBLINGUAL | Status: DC | PRN

## 2013-12-20 MED ORDER — SPIRONOLACTONE 25 MG PO TABS: 12.5000 mg | ORAL_TABLET | Freq: Every day | ORAL | Status: DC

## 2013-12-20 MED ORDER — TICAGRELOR 90 MG PO TABS: 90.0000 mg | ORAL_TABLET | Freq: Two times a day (BID) | ORAL | Status: DC

## 2013-12-20 MED ORDER — ASPIRIN 81 MG PO TBEC: 81.0000 mg | DELAYED_RELEASE_TABLET | Freq: Every day | ORAL | Status: AC

## 2013-12-20 MED ORDER — METFORMIN HCL 500 MG PO TABS
500.0000 mg | ORAL_TABLET | Freq: Two times a day (BID) | ORAL | Status: DC
  Administered 2013-12-20: 500 mg via ORAL
  Filled 2013-12-20 (×3): qty 1

## 2013-12-20 MED FILL — Sodium Chloride IV Soln 0.9%: INTRAVENOUS | Qty: 50 | Status: AC

## 2013-12-20 NOTE — Discharge Instructions (Signed)

## 2013-12-20 NOTE — Discharge Summary (Signed)
Discharge Summary   Patient ID: Elizabeth Aguilar MRN: [id number], DOB/AGE: Dec 09, 1940 73 y.o. Admit date: 12/16/2013 D/C date:     12/20/2013  Primary Cardiologist:  Dr. Claiborne Billings  Principal Problem:   CAD s/p 3.0 mm x 24 mm Promus Premier DES to LAD ; residual mid RCA 80 and 90%: s/p 3.0 x 24 mm Promeus Premeir DES to RCA on this admission   Ischemic cardiomyopathy -- EF 35-40% by LV gram with mid-distal-apical anterior/anterolateral severe hypokinesis Active Problems:   Hyperlipemia   Essential hypertension, benign   Diabetes type 2, controlled    HPI: Elizabeth Aguilar is a 73 year old female who has a history of diabetes mellitus, hyperlipidemia and intermittent palpitations who was transported from [facility] on 12/16/13 in the setting of an ST segment elevation anterolateral myocardial infarction. The patient admits to experiencing 3 days of chest pain. Upon arrival she was brought immediately to the cardiac catheterization laboratory for acute cardiac catheterization and possible intervention.    Hospital Course:   Coronary artery disease: -STEMI myocardial infarction involving total LAD coronary artery occlusion and significant mid RCA stenosis (80-90%).  -s/p PCI (3.0 x 24 mm Promus Premier DES) of the LAD on 12/16/13. Cath revealed acute LV dysfunction with an EF of approximately 35-40% with severe hypokinesis involving the mid distal anterolateral wall apex and extending to involve the apical inferior segment with 2+ into angiographic mitral regurgitation. She also had severe concomitant CAD involving a large dominant right coronary artery with significant mid RCA stenosis (80-90%). Because of the importance of this right coronary artery remaining patent in light of her significant LV dysfunction, the patient underwent a staged RCA percutaneous coronary intervention of high-grade RCA stenosis. On 12/17/13 she underwent her second cardiac catheterization and is now s/p 3.0 x 24 mm Promeus  Premeir DES to RCA. This catheterization revealed LVEF 35-40% with severe HK- mid-distal-apical anterior/anterolateral. Patient is on brilinta/ASA for dual antiplatelet therapy and received Angiomax in addition to IV nitroglycerin and increasing intravenous fluids during the procedure. -- Continue DAPT with brilinta/ASA  Ischemic cardiomyopathy-ECHO LVEF: 25%, with entire apex and septum akinetic and severe LV dilation, mild to mod MR, and mild LA dilatation.  -- Now with Lifevest with follow up MRI to be scheduled in 3 mo -- Added spironolactone 12.5 mg daily. She will continue Toprol XL and lisinopril.   Diabetes type 2, controlled  -HA1C 6.5  - Metformin 500 mg bid. This was held x 48 hours after contrast dye and now resumed.  HLD-Pravastatin discontinued and she was put on atorvastatin 40. She has a history of side effects with simvastatin.   Vtach -- Central Tele reported patient had 5 beats of V/Tach 12/18/13, with life vest  Outstanding labs and imagining: will need a follow up MRI in 3 months  Discharge Vitals: Blood pressure 128/60, pulse 105, temperature 97.7 F (36.5 C), temperature source Oral, resp. rate 18, height 5\' 3"  (1.6 m), weight 154 lb 8.7 oz (70.1 kg), SpO2 100.00%.  Labs: Lab Results  Component Value Date   WBC 10.5 12/20/2013   HGB 11.2* 12/20/2013   HCT 33.0* 12/20/2013   MCV 88.9 12/20/2013   PLT 323 12/20/2013    Recent Labs Lab 12/16/13 0910  12/19/13 0435  NA 141  < > 138  K 3.5*  < > 3.9  CL 101  < > 105  CO2 23  < > 19  BUN 21  < > 17  CREATININE 0.95  < >  0.94  CALCIUM 8.7  < > 8.7  PROT 6.4  --   --   BILITOT 0.2*  --   --   ALKPHOS 96  --   --   ALT 12  --   --   AST 25  --   --   GLUCOSE 197*  < > 116*  < > = values in this interval not displayed. No results found for this basename: CKTOTAL, CKMB, TROPONINI,  in the last 72 hours Lab Results  Component Value Date   CHOL 123 12/17/2013   HDL 39* 12/17/2013   LDLCALC 63 12/17/2013   TRIG 106  12/17/2013    Diagnostic Studies/Procedures   2/5/215    CARDIAC CATHETERIZATION/EMERGENT PERCUTANEOUS CORONARY INTERVENTION  HISTORY: Elizabeth Aguilar is a 73 year old female who has a history of diabetes mellitus, hyperlipidemia who was transported from [facility] in a setting of an ST segment elevation anterior myocardial infarction. The patient admits to experiencing 3 days of chest pain. Upon arrival she was brought immediately to the cardiac catheterization laboratory for acute cardiac catheterization and possible intervention.  PROCEDURE:  Upon arrival to the catheterization laboratory, the patient was still experiencing 5 out 10 chest pain. She was premedicated with 2 mg of Versed and 25 mcg of fentanyl. The right femoral artery was punctured anteriorly and a 6 French sheath was inserted without difficulty with one anterior stick. Diagnostic catheterization was done utilizing [place] 4  6 French coronary catheters. With the demonstration of total occlusion of the LAD after the first septal and diagonal vessel the decision was made to attempt emergent percutaneous coronary intervention. Angiomax bolus plus infusion was administered and brilinta 180 mg  was given orally for antiplatelet therapy. A 6 [place] SL 3.5 guide was used. An [place]ahi medium wire was advanced down the LAD and with a 2.0x12 mm sprinter legend balloon the lesion was crossed. Dilatation was done at 4, 5 and 10 atmospheres with this balloon. This resulted in resumption of TIMI-3 flow. There was evidence for a residual long stenosis just after the septal perforating artery. A 3.0x24 mm Promus Premier DES stent was then successfully inserted and deployed x2 at 13 and 14 atmospheres. A 3.25x20 mm [place] Euphora balloon was used for post stent dilatation. Scout angiography confirmed an excellent angiographic result. There was minimal haziness just proximal to the stent in the region of the septal perforating artery takeoff without  stenosis. A 6 French pigtail catheter was then used for RAO ventriculography At this point, the patient was completely pain-free. She had excellent result from stenting. The decision was made to continue her on Angiomax infusion at low dose for 4 hours post procedure. In light of her concomitant significant mid RCA stenosis and a dominant vessel the plan will be to bring her back to the laboratory tomorrow for relook at her LAD and planned PCI to her large right coronary artery.  HEMODYNAMICS:  Central Aorta: 120/66  Left Ventricle: 120/17/20  ANGIOGRAPHY:  Left main coronary artery was an angiographically normal vessel that bifurcated into the LAD and left circumflex coronary artery.  The left anterior descending artery gave rise to a proximal diagonal vessel and large septal perforating artery. It was 20% narrowing at the ostium of the diagonal vessel and 70% ostial narrowing in this large septal perforating artery. The LAD just beyond the septal perforating artery was totally occluded and there was TIMI 0 flow.  The left circumflex coronary artery was angiographically normal vessel gave rise  to one major circumflex marginal vessel.  Right coronary artery is a large caliber dominant vessel that had focal 80-90% mid stenosis. The RCA supplied a large PDA and large posterolateral vessels.  Following successful percutaneous cardiac intervention to the LAD utilizing a 2.0x12 mm sprinter balloon, a 3.0x24 mm Promus Premier DES stent, postdilated to 3.25 mm, the 100% occlusion was reduced to 0%. The stent was placed immediately beyond the septal perforating artery takeoff. There was brisk TIMI-3 flow. There is no evidence for dissection. The LAD was large caliber vessel that wrapped around the LV apex. There is minimal haziness without stenosis just in the region of the septal perforator takeoff.  RAO left ventriculography revealed moderate acute LV dysfunction. He was vigorous contractility of the upper basal  upper mid anterior wall and significant vigorous contractility involving the inferior wall but there was a large segment of severe hypokinesis to akinesis involving the mid anterolateral wall extending around the apex and supplying the distal apical inferior segment. It was procedure graphic mitral regurgitation.  IMPRESSION:  Acute LV dysfunction with an EF of approximately 35-40% with severe hypokinesis involving the mid distal anterolateral wall apex and extending to involve the apical inferior segment with 2+ into angiographic mitral regurgitation.  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 There was 70% focal ostial narrowing in the septal perforating artery and 20% ostial narrowing in the diagonal vessel.  Normal left circumflex coronary artery.  Large dominant right coronary artery with focal 80-90% mid stenosis.  Successful emergent percutaneous coronary intervention of the left anterior descending artery without insertion of a 3.0x24 mm Promus Premier DES stent postdilated 3.25 mm percent occlusion being reduced to 0% and with resumption of brisk TIMI-3 flow.  RECOMMENDATION:  Elizabeth Aguilar has experienced 3 days duration of significant substernal chest pressure. She is diabetic and has history of hyperlipidemia and hypertension. Her ECG did show ST elevation anteriorly and anterolaterally. Presently, her acute infarct vessel was successfully reperfused with resumption of TIMI-3 flow. She does have significant acute wall motion abnormality and due to duration of chest pain and she may have significant scarring of her mid distal LAD territory. She does have severe concomitant CAD involving a large dominant right coronary artery and has vigorous inferior wall contractility. Because of the importance of this right coronary artery remaining patent in  light of her contralateral wall motion abnormality, the patient will be brought back to the laboratory tomorrow to undergo staged RCA percutaneous coronary intervention of this high-grade RCA stenosis.  12/17/13 CARDIAC CATHETERIZATION/PERCUTANEOUS CORONARY INTERVENTION  HISTORY: Elizabeth Aguilar is a 3 -year-old female with a history of diabetes mellitus, and hyperlipidemia. She presented to [facility] yesterday in transfer from [facility] in the setting of an ST segment elevation anterior myocardial infarction. She had been experiencing chest pain for 3 days. Acute catheterization was done which showed poor LAD occlusion after a proximal septal and diagonal vessel which was successfully intervened upon. She did have mild haziness just proximal to the stent in the region of the septal takeoff. She also was noted to have 80-90% midright coronary artery stenosis. She had significant wall motion abnormality involving the mid distal LAD territory with severe hypokinesis. Because of her high grade concomitant CAD involving her RCA she is now brought back to the catheterization laboratory to assess her left coronary system, left ventricular pressures, and perform percutaneous  coronary intervention to the right coronary artery.  PROCEDURE:  The patient was brought to the second floor [facility] cath lab in the postabsorptive state. She was premedicated with Versed initially 1 mg and fentanyl 25 mcg but required additional repeat dosing x1. Right femoral artery was punctured anteriorly and a 6 French sheath was inserted without difficulty. A 5 French diagnostic FL4 catheter was used for selective angiography into the left coronary system which confirmed a widely patent LAD stent and with resolution of the previous haziness proximal to the stented segment in the region of the septal perforating artery. A 6 French Right Guide catheter was then inserted and this was advanced into the left ventricle for  recording of left ventricular end-diastolic pressure and LV to AO pullback. This revealed that the LVEDP was low at 9 mm and consequently the patient's fluids were increased to 100 cc per hour. Selective angiography of the right coronary artery again confirmed a 90% stenosis in the midsegment but there was narrowing proximal and just beyond this focal stenosis. For this reason it was felt that a longer stent would be necessary to cover the entire region. Angiomax bolus plus infusion was administered. The patient had been started on brilinta yesterday and had received her morning dose of 90 mg already today. An ACT was obtained and was documented to be therapeutic. A prowater wire was advanced down the right coronary artery. Predilatation was done with a 2.5x15 mm [place] balloon at 6 and 8 atmospheres. A 3.0 x 24 mm Promus Premier DES stent was then inserted and to cover the entire region of plaque. This was dilated x2 at 13 and 14 atmospheres. A 3.25x20 mm [place] Trek balloon was used for post stent dilatation up to 3.26 mm. Scout angiography confirmed an excellent angiographic result.TIMI-3 flow. There is no evidence for dissection  HEMODYNAMICS:  Central Aorta: 125/60  Left Ventricle: 125/9  ANGIOGRAPHY:  Left main coronary artery is radiographically normal and bifurcated into the LAD and left circumflex vessel.  The LAD was a moderate size vessel that extended to and wrapped around the LV apex. The stented proximal LAD after the first diagonal and septal perforator artery is widely patent. The previous haziness that was present in the region of the septal perforating artery is now completely resolved. There was no evidence for stenosis proximal to the stented segment.  The left circumflex coronary artery was angiographic normal and gave rise to one major circumflex vessel is unchanged.  The right coronary was a large caliber dominant vessel that had focal mid 90% stenosis. There was mild to moderate plaque  proximal to this focal stenosis and slightly beyond the stenosis. Following predilatation and stenting with a 3.0 x 24 mm Promus Premier DES stent post dilated to 3.26 mm, the entire stenosis was reduced to 0%. There was brisk TIMI-3 flow. There is no evidence for dissection.  IMPRESSION:  Mild volume depletion with LVEDP 9 mm  Widely patent LAD stent in this patient status post ST segment elevation anterior MI yesterday treated with acute PTCA stenting of a totally occluded LAD with 100% occlusion reduced to 0% and with resolution of prior thrombus.  High-grade focal right coronary artery stenosis of 90% treated with successful PTCA/stenting with open insertion of a 3.0x24 mm Promus Premier DES stent postdilated to 3.26 mm  Patient is on brilinta/ASA for dual antiplatelet therapy and received Angiomax in addition to IV nitroglycerin and increasing intravenous fluids during the procedure  2D ECHO 12/18/2013 ------------------------------------------------------------  LV EF: 25% ------------------------------------------------------------ Indications: MI - old [greater than 8 weeks]. ------------------------------------------------------------ History: PMH: Coronary artery disease. Ischemic cardiomyopathy. Risk factors: Hypertension. Diabetes mellitus. Dyslipidemia. ------------------------------------------------------------ Study Conclusions - Left ventricle: Primarily normal basal function mid and distal anterior wall , entire apex and septum akinetic No mural thrombus by contrast The cavity size was severely dilated. Wall thickness was normal. The estimated ejection fraction was 25%. - Mitral valve: Mild to moderate regurgitation. - Left atrium: The atrium was mildly dilated. - Atrial septum: No defect or patent foramen ovale was identified. Transthoracic echocardiography. M-mode, complete 2D, spectral Doppler, and color Doppler. Height: Height: 160cm. Height: 63in. Weight: Weight:  70.9kg. Weight: 156lb. Body mass index: BMI: 27.7kg/m^2. Body surface area: BSA: 1.85m^2. Blood pressure: 132/51. Patient status: Inpatient. Location: ICU/CCU ----------------------------------------------------------- ------------------------------------------------------------ Left ventricle: Primarily normal basal function mid and distal anterior wall , entire apex and septum akinetic No mural thrombus by contrast The cavity size was severely dilated. Wall thickness was normal. The estimated ejection fraction was 25%. ------------------------------------------------------------ Aortic valve: Mildly thickened leaflets. Doppler: There was no stenosis. No significant regurgitation. ------------------------------------------------------------ Mitral valve: Mildly thickened leaflets . Doppler: Mild to moderate regurgitation. Peak gradient: 80mm Hg (D). ------------------------------------------------------------ Left atrium: The atrium was mildly dilated. ------------------------------------------------------------ Atrial septum: No defect or patent foramen ovale was identified. ------------------------------------------------------------ Right ventricle: The cavity size was normal. Wall thickness was normal. Systolic function was normal. ------------------------------------------------------------ Pulmonic valve: Structurally normal valve. Cusp separation was normal. Doppler: Transvalvular velocity was within the normal range. Trivial regurgitation. ------------------------------------------------------------ Tricuspid valve: Structurally normal valve. Leaflet separation was normal. Doppler: Transvalvular velocity was within the normal range. Mild regurgitation. ------------------------------------------------------------ Right atrium: The atrium was normal in size. ------------------------------------------------------------ Pericardium: The pericardium was normal in  appearance.     Discharge Medications     Medication List    STOP taking these medications       amoxicillin 500 MG tablet  Commonly known as:  AMOXIL     pravastatin 80 MG tablet  Commonly known as:  PRAVACHOL      TAKE these medications       ALPRAZolam 0.5 MG tablet  Commonly known as:  XANAX  Take one BID prn     aspirin 81 MG EC tablet  Take 1 tablet (81 mg total) by mouth daily.     atorvastatin 40 MG tablet  Commonly known as:  LIPITOR  Take 1 tablet (40 mg total) by mouth daily at 6 PM.     cyclobenzaprine 10 MG tablet  Commonly known as:  FLEXERIL  Take 1 tablet (10 mg total) by mouth at bedtime as needed for muscle spasms.     esomeprazole 40 MG capsule  Commonly known as:  NEXIUM  Take 1 capsule (40 mg total) by mouth daily before breakfast.     fluticasone 50 MCG/ACT nasal spray  Commonly known as:  FLONASE  Place 2 sprays into the nose daily.     hydrochlorothiazide 25 MG tablet  Commonly known as:  HYDRODIURIL  Take 0.5 tablets (12.5 mg total) by mouth daily.     HYDROcodone-acetaminophen 5-325 MG per tablet  Commonly known as:  NORCO/VICODIN  Take 1 tablet by mouth 3 (three) times daily as needed.     ketorolac 0.5 % ophthalmic solution  Commonly known as:  ACULAR  Place 1 drop into both eyes 4 (four) times daily.     lisinopril 5 MG tablet  Commonly known as:  PRINIVIL,ZESTRIL  Take 1 tablet (5 mg total) by mouth  daily.     loratadine 10 MG tablet  Commonly known as:  CLARITIN  Take 1 tablet (10 mg total) by mouth daily.     meloxicam 7.5 MG tablet  Commonly known as:  MOBIC  Take 1 tablet (7.5 mg total) by mouth daily.     metFORMIN 500 MG tablet  Commonly known as:  GLUCOPHAGE  Take 1 tablet (500 mg total) by mouth 2 (two) times daily with a meal.     metoprolol 200 MG 24 hr tablet  Commonly known as:  TOPROL-XL  Take 1 tablet (200 mg total) by mouth daily.     multivitamin tablet  Take 1 tablet by mouth daily.      nitroGLYCERIN 0.4 MG SL tablet  Commonly known as:  NITROSTAT  Place 1 tablet (0.4 mg total) under the tongue every 5 (five) minutes x 3 doses as needed for chest pain.     spironolactone 25 MG tablet  Commonly known as:  ALDACTONE  Take 0.5 tablets (12.5 mg total) by mouth daily.     Ticagrelor 90 MG Tabs tablet  Commonly known as:  BRILINTA  Take 1 tablet (90 mg total) by mouth 2 (two) times daily.     vitamin C 500 MG tablet  Commonly known as:  ASCORBIC ACID  Take 500 mg by mouth daily.        Disposition   The patient will be discharged in stable condition to home. Discharge Orders   Future Appointments Provider Department Dept Phone   12/28/2013 2:40 PM Ilean China [facility] [id number]   02/15/2014 1:10 PM Kathyrn Drown, MD [place] [phone]   Future Orders Complete By Expires   Amb Referral to Cardiac Rehabilitation  As directed      Follow-up Information   Follow up with Erlene Quan, PA-C. (@ 2:40 pm)    Specialty:  Cardiology   Contact information:   [address] [place] [address] [phone]         Duration of Discharge Encounter: Greater than 30 minutes including physician and PA time.  SignedVertell Limber, Alica Shellhammer PA-C 12/20/2013, 1:29 PM

## 2013-12-20 NOTE — Progress Notes (Signed)
D/c orders received;IV removed with gauze on, pt remains in stable condition, pt meds and instructions reviewed and given to pt; pt d/c to home 

## 2013-12-20 NOTE — Progress Notes (Signed)
Subjective: Patient without CP or SOB   Objective: Filed Vitals:   12/18/13 2145 12/19/13 0500 12/19/13 2100 12/20/13 0500  BP: 136/63 132/64 136/66 138/59  Pulse: 106 96 103 98  Temp:  97.3 F (36.3 C) 97.7 F (36.5 C) 97.7 F (36.5 C)  TempSrc:      Resp:  20 18 18   Height:      Weight:  154 lb 8.7 oz (70.1 kg)    SpO2: 99% 99% 98% 100%   Weight change:   Intake/Output Summary (Last 24 hours) at 12/20/13 0953 Last data filed at 12/19/13 2100  Gross per 24 hour  Intake    240 ml  Output      0 ml  Net    240 ml    General: Alert, awake, oriented x3, in no acute distress Neck:  JVP is normal Heart: Regular rate and rhythm, without murmurs, rubs, gallops.  Lungs: Clear to auscultation.  No rales or wheezes. Exemities:  No edema.   Neuro: Grossly intact, nonfocal. Tele:  SR   Lab Results: Results for orders placed during the hospital encounter of 12/16/13 (from the past 24 hour(s))  GLUCOSE, CAPILLARY     Status: Abnormal   Collection Time    12/19/13 11:46 AM      Result Value Range   Glucose-Capillary 150 (*) 70 - 99 mg/dL  GLUCOSE, CAPILLARY     Status: Abnormal   Collection Time    12/19/13  4:50 PM      Result Value Range   Glucose-Capillary 111 (*) 70 - 99 mg/dL   Comment 1 Notify RN     Comment 2 Documented in Chart    GLUCOSE, CAPILLARY     Status: Abnormal   Collection Time    12/19/13  9:16 PM      Result Value Range   Glucose-Capillary 127 (*) 70 - 99 mg/dL  CBC     Status: Abnormal   Collection Time    12/20/13  6:34 AM      Result Value Range   WBC 10.5  4.0 - 10.5 K/uL   RBC 3.71 (*) 3.87 - 5.11 MIL/uL   Hemoglobin 11.2 (*) 12.0 - 15.0 g/dL   HCT 33.0 (*) 36.0 - 46.0 %   MCV 88.9  78.0 - 100.0 fL   MCH 30.2  26.0 - 34.0 pg   MCHC 33.9  30.0 - 36.0 g/dL   RDW 13.6  11.5 - 15.5 %   Platelets 323  150 - 400 K/uL  GLUCOSE, CAPILLARY     Status: Abnormal   Collection Time    12/20/13  7:46 AM      Result Value Range   Glucose-Capillary 123  (*) 70 - 99 mg/dL    Studies/Results: @RISRSLT24 @  Medications: Reviewed   @PROBHOSP @  1.  STEMI  100% LAD 80-90% RCA  S/p 3x65mm Promus DES.  LVEF 35 to 40%  Echo LVEF 25% Continue bilinta, ASA 81, ACEI BB  Plan for life vest D/C today after vest F/U MRI in 3 mon  2.  DM  Resume Metformin  3.  HL  Keep on pravachol  4.  HTN    LOS: 4 days   Dorris Carnes 12/20/2013, 9:53 AM

## 2013-12-22 ENCOUNTER — Telehealth: Payer: Self-pay | Admitting: Family Medicine

## 2013-12-22 ENCOUNTER — Encounter: Payer: Self-pay | Admitting: Cardiovascular Disease

## 2013-12-22 NOTE — Telephone Encounter (Signed)
Notified patient that the doctor was aware and have been following her progress in the electronic record, certainly we hope for the best and if we can be of help let us know. Patient verbalized understanding.

## 2013-12-22 NOTE — Telephone Encounter (Signed)
Please tell Elizabeth Aguilar I was aware and have been following her moms progress in the electronic record, certainly we hope for the best and if we can be of help let us know

## 2013-12-22 NOTE — Telephone Encounter (Signed)
pts daughter calling to say she had a heart attack last Thursday went to [facility]  Had double stint and now has a life vest (to shock her if she passes out) is being seen in [place]

## 2013-12-28 ENCOUNTER — Ambulatory Visit: Payer: Medicare Other | Admitting: Cardiology

## 2013-12-30 ENCOUNTER — Other Ambulatory Visit: Payer: Self-pay | Admitting: *Deleted

## 2013-12-30 MED ORDER — METOPROLOL SUCCINATE ER 200 MG PO TB24: 200.0000 mg | ORAL_TABLET | Freq: Every day | ORAL | Status: DC

## 2013-12-30 MED ORDER — LISINOPRIL 5 MG PO TABS: 5.0000 mg | ORAL_TABLET | Freq: Every day | ORAL | Status: DC

## 2013-12-31 ENCOUNTER — Other Ambulatory Visit: Payer: Self-pay | Admitting: *Deleted

## 2013-12-31 MED ORDER — ALPRAZOLAM 0.5 MG PO TABS: ORAL_TABLET | ORAL | Status: DC

## 2014-01-03 ENCOUNTER — Ambulatory Visit (INDEPENDENT_AMBULATORY_CARE_PROVIDER_SITE_OTHER): Payer: Medicare Other | Admitting: Family Medicine

## 2014-01-03 ENCOUNTER — Encounter: Payer: Self-pay | Admitting: Family Medicine

## 2014-01-03 VITALS — BP 128/82 | Temp 98.4°F | Ht 63.75 in | Wt 155.0 lb

## 2014-01-03 DIAGNOSIS — E119 Type 2 diabetes mellitus without complications: Secondary | ICD-10-CM

## 2014-01-03 DIAGNOSIS — I2589 Other forms of chronic ischemic heart disease: Secondary | ICD-10-CM | POA: Diagnosis not present

## 2014-01-03 DIAGNOSIS — I255 Ischemic cardiomyopathy: Secondary | ICD-10-CM

## 2014-01-03 MED ORDER — COLCHICINE 0.6 MG PO TABS: 0.6000 mg | ORAL_TABLET | Freq: Two times a day (BID) | ORAL | Status: DC

## 2014-01-03 MED ORDER — AMOXICILLIN 500 MG PO TABS: ORAL_TABLET | ORAL | Status: DC

## 2014-01-03 NOTE — Progress Notes (Signed)
   Subjective:    Patient ID: Elizabeth Aguilar, female    DOB: 1941-08-26, 73 y.o.   MRN: [id number]  Sinus Problem This is a new problem. The current episode started yesterday. Associated symptoms include congestion and coughing.   25 minutes was spent with this patient discussing her hospitalization the findings of it and the importance of what she is doing currently we also discussed her stents discussed her medication discuss the external defibrillator she is wearing. She needs to followup with cardiology patient was told that we will still be her primary care doctor but cardiology will be managing certain aspects of her care.    Review of Systems  HENT: Positive for congestion.   Respiratory: Positive for cough.    she denies swelling denies chest pressure tightness denies epistaxis     Objective:   Physical Exam Lungs clear heart is regular nose is normal extremities no edema skin warm dry       Assessment & Plan:  Viral upper rest revealed a supportive measures continue Flonase and loratadine for allergies  If develops sinus infection let us know  Minimal gout symptoms in the foot cold to seen recommended if worse patient was given prescription she will hold off on filling it currently  Significant cardiac dysfunction/CHF do to recent heart attack hopefully her myocardium improved some significant time spent with patient today discussing all of these issues the importance of sticking with her medicine the importance of secondary prevention plus also the importance of following up her lipid liver profile in approximately 8 weeks with followup office visit she is to see cardiology regarding her external defibrillator

## 2014-01-05 ENCOUNTER — Telehealth: Payer: Self-pay | Admitting: Family Medicine

## 2014-01-05 MED ORDER — PREDNISONE 20 MG PO TABS: ORAL_TABLET | ORAL | Status: DC

## 2014-01-05 NOTE — Telephone Encounter (Signed)
Short course of prednisone should help, be very cautious with her diet to avoid starches and sugars, prednisone 20 mg 3 per day for 2 days, 2 per day for 2 days, one per day for 2 days, followup if ongoing troubles

## 2014-01-05 NOTE — Telephone Encounter (Signed)
Med sent to pharm. Pt notified.  

## 2014-01-05 NOTE — Telephone Encounter (Signed)
Pt seen Monday, was given rx for gout, now right foot is worse, swollen, difficulty walking.  Please advise [facility])

## 2014-01-12 ENCOUNTER — Other Ambulatory Visit: Payer: Self-pay | Admitting: Family Medicine

## 2014-01-12 NOTE — Telephone Encounter (Signed)
She may have this prescription. And she may have an additional prescription she can get filled in 30 days. She would need to followup near the end of that second prescription

## 2014-01-13 ENCOUNTER — Telehealth: Payer: Self-pay | Admitting: Family Medicine

## 2014-01-13 ENCOUNTER — Telehealth: Payer: Self-pay | Admitting: Cardiovascular Disease

## 2014-01-13 ENCOUNTER — Telehealth: Payer: Self-pay | Admitting: *Deleted

## 2014-01-13 MED ORDER — HYDROCODONE-ACETAMINOPHEN 5-325 MG PO TABS: ORAL_TABLET | ORAL | Status: DC

## 2014-01-13 NOTE — Telephone Encounter (Signed)
Patient notified and med waiting up front

## 2014-01-13 NOTE — Telephone Encounter (Signed)
Daughter called back and pt verified x 2.  Stated pt has been sleeping a lot today.  Stated when she came home from the hospital she was active and just today it's changed.  Wanted to know if it was pt's BP meds.  RN asked if BP checked and daughter stated it was fine at PCP appt on Monday (2.23.15).  Stated BG has been good too.  RN asked if pt taking Vicodin, Xanax and/or Flexaril as all of these meds can make pt drowsy.  Daughter stated pt takes a Xanax every night and has not needed Flexaril and had a few Vicodin (refilled today).  Daughter informed pt has not been seen since discharge from hospital and should be seen.  Appt scheduled for 3.10.15 and RN offered a sooner appt, but daughter declined r/t Mr. Mikel Cella having an appt in our office on the same day.  Stated for financial reasons, pt likely scheduled appt the same day as Mr. Redmond Pulling.  Daughter informed f/u appts are usually scheduled much sooner than this.  Daughter verbalized understanding and stated she will just call EMS if pt gets worse and they will take her to [facility] ER and keep appt on Tuesday, 3.10.15.  RN advised she contact PCP as she was seen there most recently and some of the meds she is rx'd may be contributing to her sleeping a lot.  Verbalized understanding and stated they told her to call our office.  No BP readings to go on other than OV on 2.23.15 and BP 128/82.    Will plan to see pt on 3.10.15 w/ Kerin Ransom, PA-C and copy Dr. Sallee Lange on this message [facility]).

## 2014-01-13 NOTE — Telephone Encounter (Signed)
Error

## 2014-01-13 NOTE — Telephone Encounter (Signed)
Her mother is being sluggish and sleeping. Having been falling asleep every couple of minutes and that is not like her . Please call [phone]   Thanks

## 2014-01-13 NOTE — Telephone Encounter (Signed)
Nurses to call, this is a medication problem. I am willing to see the patient Friday afternoon. Please call them set that up. Certainly go to the ER if any significant problems. Bring all medicines to office visit.

## 2014-01-13 NOTE — Telephone Encounter (Signed)
Pt's daughter was calling back.

## 2014-01-13 NOTE — Telephone Encounter (Signed)
Returned call.  No answer/voicemail.  Voicemail not set up.

## 2014-01-14 ENCOUNTER — Ambulatory Visit (INDEPENDENT_AMBULATORY_CARE_PROVIDER_SITE_OTHER): Payer: Medicare Other | Admitting: Family Medicine

## 2014-01-14 ENCOUNTER — Encounter: Payer: Self-pay | Admitting: Family Medicine

## 2014-01-14 VITALS — BP 112/60 | Ht 63.5 in | Wt 155.0 lb

## 2014-01-14 DIAGNOSIS — I255 Ischemic cardiomyopathy: Secondary | ICD-10-CM

## 2014-01-14 DIAGNOSIS — R0600 Dyspnea, unspecified: Secondary | ICD-10-CM

## 2014-01-14 DIAGNOSIS — R0609 Other forms of dyspnea: Secondary | ICD-10-CM

## 2014-01-14 DIAGNOSIS — I2589 Other forms of chronic ischemic heart disease: Secondary | ICD-10-CM | POA: Diagnosis not present

## 2014-01-14 DIAGNOSIS — I10 Essential (primary) hypertension: Secondary | ICD-10-CM

## 2014-01-14 DIAGNOSIS — R0989 Other specified symptoms and signs involving the circulatory and respiratory systems: Secondary | ICD-10-CM

## 2014-01-14 LAB — BASIC METABOLIC PANEL
BUN: 28 mg/dL — AB (ref 6–23)
CHLORIDE: 100 meq/L (ref 96–112)
CO2: 28 meq/L (ref 19–32)
CREATININE: 1.05 mg/dL (ref 0.50–1.10)
Calcium: 9.8 mg/dL (ref 8.4–10.5)
GLUCOSE: 124 mg/dL — AB (ref 70–99)
Potassium: 4.5 mEq/L (ref 3.5–5.3)
Sodium: 136 mEq/L (ref 135–145)

## 2014-01-14 MED ORDER — SPIRONOLACTONE 25 MG PO TABS: 12.5000 mg | ORAL_TABLET | Freq: Every day | ORAL | Status: DC

## 2014-01-14 NOTE — Telephone Encounter (Signed)
Daughter transferred to front desk to schedule appointment today.

## 2014-01-14 NOTE — Progress Notes (Signed)
   Subjective:    Patient ID: Elizabeth Aguilar, female    DOB: 1941/09/06, 73 y.o.   MRN: [id number]  [place] since yesterday. Sleeping a lot. Patient denies wheezing shortness breath nausea vomiting diarrhea. She states significant fatigue tiredness feels like she's walking in the last this feels very tired very sleepy. This is not usually her. She denies abusing any medication she's only taking what she was prescribed. She states that she had been doing well up until the past few days no fevers no recent sickness she was in the hospital with a heart attack and she does have external ICD on her. She denies any other particular problems. She brings all of her medicines in. We went over them one by one and the purpose of the medications. Past medical history please see previous hospital notes. Once again patient denies missed taking her medicines.  Review of Systems Denies chest tightness pain shortness breath she does relate somewhat dizzy when she stands up she also relates severe fatigue she also relates tiredness she does stay she is urinating okay she feels she is drinking okay.    Objective:   Physical Exam Her lungs are clear no crackles heart rate controlled in the 60s. Pulses are normal extremities no edema neck no masses eardrums normal       Assessment & Plan:  #1 orthostatics were completed. Patient is not orthostatic but her blood pressure is excessively low. I got approximately 90/60 both laying sitting and standing. I believe that this is contributing to her significant fatigue and tiredness. Also there is no sign of cardiac decompensation currently but I feel that the level of her beta blocker for her current situation is excessive. I recommend reducing it to a half of a tablet essentially 100 mg XL she is going to be seeing cardiology in several days they can recheck heart rate and blood pressures  #2 patient is on several medications which could affect creatinine as well as  potassium check metabolic 7 await the results will make any changes accordingly  #3 I do not feel the patient is abusing any of her medicines she only uses muscle relaxer at bedtime and has not been taking any narcotics recently so I do not feel that those were the cause of her issues. She will followup with [place] in approximately 2 months. She will see cardiology next week.

## 2014-01-15 LAB — BRAIN NATRIURETIC PEPTIDE: Brain Natriuretic Peptide: 230.2 pg/mL — ABNORMAL HIGH (ref 0.0–100.0)

## 2014-01-18 ENCOUNTER — Ambulatory Visit: Payer: Medicare Other | Admitting: Cardiology

## 2014-01-18 ENCOUNTER — Ambulatory Visit (INDEPENDENT_AMBULATORY_CARE_PROVIDER_SITE_OTHER): Payer: Medicare Other | Admitting: Cardiology

## 2014-01-18 ENCOUNTER — Encounter: Payer: Self-pay | Admitting: Cardiology

## 2014-01-18 VITALS — BP 116/68 | HR 115 | Ht 63.0 in | Wt 153.0 lb

## 2014-01-18 DIAGNOSIS — E119 Type 2 diabetes mellitus without complications: Secondary | ICD-10-CM

## 2014-01-18 DIAGNOSIS — I10 Essential (primary) hypertension: Secondary | ICD-10-CM | POA: Diagnosis not present

## 2014-01-18 DIAGNOSIS — Z9861 Coronary angioplasty status: Secondary | ICD-10-CM

## 2014-01-18 DIAGNOSIS — I2510 Atherosclerotic heart disease of native coronary artery without angina pectoris: Secondary | ICD-10-CM | POA: Diagnosis not present

## 2014-01-18 DIAGNOSIS — I498 Other specified cardiac arrhythmias: Secondary | ICD-10-CM

## 2014-01-18 DIAGNOSIS — I2109 ST elevation (STEMI) myocardial infarction involving other coronary artery of anterior wall: Secondary | ICD-10-CM

## 2014-01-18 DIAGNOSIS — I255 Ischemic cardiomyopathy: Secondary | ICD-10-CM

## 2014-01-18 DIAGNOSIS — I2589 Other forms of chronic ischemic heart disease: Secondary | ICD-10-CM

## 2014-01-18 DIAGNOSIS — R000 Tachycardia, unspecified: Secondary | ICD-10-CM | POA: Insufficient documentation

## 2014-01-18 DIAGNOSIS — I2102 ST elevation (STEMI) myocardial infarction involving left anterior descending coronary artery: Secondary | ICD-10-CM

## 2014-01-18 NOTE — Assessment & Plan Note (Signed)
She tells me this has been a chronic issue. She could not tolerate high dose Metoprolol (200mg  daily) and this has been cut back.

## 2014-01-18 NOTE — Assessment & Plan Note (Addendum)
No CHF symptoms. She is wearing [employer] education [employer]

## 2014-01-18 NOTE — Patient Instructions (Signed)
Continue current medical treatment and Life Vest Follow up in one month with Dr Claiborne Billings

## 2014-01-18 NOTE — Assessment & Plan Note (Signed)
No chest pain

## 2014-01-18 NOTE — Assessment & Plan Note (Signed)
Meds recently decreased by Dr Wolfgang Phoenix for symptomatic hypotension

## 2014-01-18 NOTE — Assessment & Plan Note (Signed)
Primary follows 

## 2014-01-18 NOTE — Progress Notes (Signed)
01/18/2014 Elizabeth Aguilar   [birth year]  [id number]  Primary Physicia Sallee Lange, MD Primary Cardiologist: Dr Claiborne Billings  HPI:  Elizabeth Aguilar 73 y/o female from [place]. She had seen Dr Mathis Bud in the past for "tachycardia". She says she has always had a fast HR. She presented as a STEMI 12/16/13 and underwent LAD DES. She had residual RCA disease and had a DES placed 12/17/13. EF by echo was 25% and she was sent home with a Life Vest. She is here today for follow up. She recently saw Dr Wolfgang Phoenix in [place] for weakness and orthostatic symptoms. He documented orthostatic B/P changes and decreased her medications-HCTZ decreased to 12.5 mg daily, Lisinopril to 5 mg, Aldactone to 12.5 mg, and Toprol to 100 mg. She feels better since these changes were made. She denies any chest pain or unusual dyspnea.     Current Outpatient Prescriptions  Medication Sig Dispense Refill  . ALPRAZolam (XANAX) 0.5 MG tablet Take one BID prn  60 tablet  2  . amoxicillin (AMOXIL) 500 MG tablet Take 4 pills 2 hours before the dental work  4 tablet  2  . aspirin EC 81 MG EC tablet Take 1 tablet (81 mg total) by mouth daily.      [name] atorvastatin (LIPITOR) 40 MG tablet Take 1 tablet (40 mg total) by mouth daily at 6 PM.  30 tablet  6  . colchicine 0.6 MG tablet Take 1 tablet (0.6 mg total) by mouth 2 (two) times daily.  24 tablet  0  . cyclobenzaprine (FLEXERIL) 10 MG tablet Take 1 tablet (10 mg total) by mouth at bedtime as needed for muscle spasms.  30 tablet  6  . esomeprazole (NEXIUM) 40 MG capsule Take 1 capsule (40 mg total) by mouth daily before breakfast.  30 capsule  6  . fluticasone (FLONASE) 50 MCG/ACT nasal spray Place 2 sprays into the nose daily.  16 g  6  . hydrochlorothiazide (HYDRODIURIL) 25 MG tablet Take 0.5 tablets (12.5 mg total) by mouth daily.  30 tablet  6  . HYDROcodone-acetaminophen (NORCO/VICODIN) 5-325 MG per tablet TAKE 1 TABLET 3 TIMES DAILY AS NEEDED FOR PAIN  90 tablet  0  . ketorolac (ACULAR)  0.5 % ophthalmic solution Place 1 drop into both eyes 4 (four) times daily.      [name] lisinopril (PRINIVIL,ZESTRIL) 5 MG tablet Take 1 tablet (5 mg total) by mouth daily.  30 tablet  5  . loratadine (CLARITIN) 10 MG tablet Take 1 tablet (10 mg total) by mouth daily.  30 tablet  5  . metFORMIN (GLUCOPHAGE) 500 MG tablet Take 1 tablet (500 mg total) by mouth 2 (two) times daily with a meal.  60 tablet  6  . metoprolol (TOPROL-XL) 200 MG 24 hr tablet Take 200 mg by mouth daily. TAKING 1/2 TAB DAILY      . Multiple Vitamin (MULTIVITAMIN) tablet Take 1 tablet by mouth daily.      . nitroGLYCERIN (NITROSTAT) 0.4 MG SL tablet Place 1 tablet (0.4 mg total) under the tongue every 5 (five) minutes x 3 doses as needed for chest pain.  25 tablet  12  . spironolactone (ALDACTONE) 25 MG tablet Take 0.5 tablets (12.5 mg total) by mouth daily.  30 tablet  6  . Ticagrelor (BRILINTA) 90 MG TABS tablet Take 1 tablet (90 mg total) by mouth 2 (two) times daily.  60 tablet  11  . vitamin C (ASCORBIC ACID) 500 MG tablet Take 500 mg  by mouth daily.       No current facility-administered medications for this visit.    Allergies  Allergen Reactions  . Actos [Pioglitazone] Swelling  . Ceftin [Cefuroxime Axetil] Nausea Only  . Celebrex [Celecoxib]     Sickness  . Darvocet [Propoxyphene N-Acetaminophen] Nausea Only  . Doxycycline   . Erythromycin   . Vioxx [Rofecoxib]     Sickness   . Zithromax [Azithromycin] Nausea Only  . Zocor [Simvastatin]     Side effects    History   Social History  . Marital Status: Widowed    Spouse Name: N/A    Number of Children: N/A  . Years of Education: N/A   Occupational History  . Not on file.   Social History Main Topics  . Smoking status: Never Smoker   . Smokeless tobacco: Not on file  . Alcohol Use: Not on file  . Drug Use: Not on file  . Sexual Activity: Not on file   Other Topics Concern  . Not on file   Social History Narrative  . No narrative on file      Review of Systems: General: negative for chills, fever, night sweats or weight changes.  Cardiovascular: negative for chest pain, dyspnea on exertion, edema, orthopnea, palpitations, paroxysmal nocturnal dyspnea or shortness of breath Dermatological: negative for rash Respiratory: negative for cough or wheezing Urologic: negative for hematuria Abdominal: negative for nausea, vomiting, diarrhea, bright red blood per rectum, melena, or hematemesis Neurologic: negative for visual changes, syncope, or dizziness All other systems reviewed and are otherwise negative except as noted above.    Blood pressure 116/68, pulse 115, height 5\' 3"  (1.6 m), weight 153 lb (69.4 kg).  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  ICM- EF 25% post MI echo No CHF symptoms. 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 She could not tolerate high dose Metoprolol (200mg  daily) and this has been cut back.    PLAN  She asked about quitting her Life Vest and I explained to her and her daughter the indications for this. She agrees to continue with it. I agree with Dr Malachy Moan medication changes although I am concerned about her HR but hesitant to increase her beta blocker now. She will follow up with Dr Claiborne Billings in 4 weeks. She'll need an echo in May.  Abundio Teuscher KPA-C 01/18/2014 2:06 PM

## 2014-01-28 ENCOUNTER — Telehealth: Payer: Self-pay | Admitting: Family Medicine

## 2014-01-28 ENCOUNTER — Telehealth: Payer: Self-pay | Admitting: Cardiovascular Disease

## 2014-01-28 MED ORDER — METOPROLOL SUCCINATE ER 100 MG PO TB24: 100.0000 mg | ORAL_TABLET | Freq: Every day | ORAL | Status: DC

## 2014-01-28 MED ORDER — ONDANSETRON 4 MG PO TBDP: 4.0000 mg | ORAL_TABLET | Freq: Three times a day (TID) | ORAL | Status: DC | PRN

## 2014-01-28 NOTE — Telephone Encounter (Signed)
Returned call and pt verified x 2.  Pt stated the metoprolol makes her sick on the stomach.  Stated Dr. Nicki Reaper told her to take a half of a pill and it still makes her sick on the stomach.  Stated she cannot take it.  Pt seen on 3.10.15 and Kerin Ransom, PA-C advised pt continue on changed dose by Dr. Wolfgang Phoenix r/t BP issue.  Pt informed metoprolol succ is available in 100 mg tab and RN can send in script.  Pt stated that's what she asked Dr. Bary Leriche [facility]) office and they told her to call our office.  Pt agreeable w/ plan and script sent to pharmacy per request.  Pt advised to discuss further w/ Dr. Claiborne Billings on Monday.  Pt verbalized understanding and agreed w/ plan.

## 2014-01-28 NOTE — Telephone Encounter (Signed)
Please call,having problem with her Metoprolol. It is making her sick to her stomach.

## 2014-01-28 NOTE — Telephone Encounter (Signed)
Ok may ref zof plus two ref 28 odt q 6 prn

## 2014-01-28 NOTE — Addendum Note (Signed)
Addended by: Dairl Ponder on: 01/28/2014 02:06 PM   Modules accepted: Orders

## 2014-01-28 NOTE — Telephone Encounter (Signed)
Rx sent electronically to pharmacy. Patient notified. 

## 2014-01-28 NOTE — Telephone Encounter (Signed)
Patient states she is taking metoprolol  succinate 200mg  and its making her sick on the stomach even when she takes half. She wants a different medication called in and she needs refill on ondansetron 4mg  also called into [facility].

## 2014-01-28 NOTE — Telephone Encounter (Signed)
Patient advised to contact Cardiology to discuss alternative meds- Patient states she has an appt. with Cardiologist on Monday 01/31/14.  Patient would still like zofran called in for nausea.

## 2014-01-31 ENCOUNTER — Ambulatory Visit (INDEPENDENT_AMBULATORY_CARE_PROVIDER_SITE_OTHER): Payer: Medicare Other | Admitting: Cardiovascular Disease

## 2014-01-31 VITALS — BP 118/75 | HR 104 | Ht 65.0 in | Wt 154.7 lb

## 2014-01-31 DIAGNOSIS — I2109 ST elevation (STEMI) myocardial infarction involving other coronary artery of anterior wall: Secondary | ICD-10-CM | POA: Diagnosis not present

## 2014-01-31 DIAGNOSIS — E785 Hyperlipidemia, unspecified: Secondary | ICD-10-CM | POA: Diagnosis not present

## 2014-01-31 DIAGNOSIS — E119 Type 2 diabetes mellitus without complications: Secondary | ICD-10-CM

## 2014-01-31 DIAGNOSIS — I10 Essential (primary) hypertension: Secondary | ICD-10-CM | POA: Diagnosis not present

## 2014-01-31 DIAGNOSIS — Z9861 Coronary angioplasty status: Secondary | ICD-10-CM

## 2014-01-31 DIAGNOSIS — I2589 Other forms of chronic ischemic heart disease: Secondary | ICD-10-CM | POA: Diagnosis not present

## 2014-01-31 DIAGNOSIS — I2102 ST elevation (STEMI) myocardial infarction involving left anterior descending coronary artery: Secondary | ICD-10-CM

## 2014-01-31 DIAGNOSIS — I255 Ischemic cardiomyopathy: Secondary | ICD-10-CM

## 2014-01-31 DIAGNOSIS — I2510 Atherosclerotic heart disease of native coronary artery without angina pectoris: Secondary | ICD-10-CM

## 2014-01-31 NOTE — Patient Instructions (Signed)
Your physician recommends that you schedule a follow-up appointment in: 6 weeks  Your physician has requested that you have an echocardiogram. Echocardiography is a painless test that uses sound waves to create images of your heart. It provides your doctor with information about the size and shape of your heart and how well your heart's chambers and valves are working. This procedure takes approximately one hour. There are no restrictions for this procedure. 1 Month  Your physician has recommended you make the following change in your medication: Take 100 mg of metoprolol in the morning and 50 mg at night.

## 2014-02-02 ENCOUNTER — Telehealth: Payer: Self-pay | Admitting: Cardiovascular Disease

## 2014-02-02 NOTE — Telephone Encounter (Signed)
Please fax to her dentist a note stating that she can not be off of her Beilinta>Please fax to [phone].

## 2014-02-02 NOTE — Telephone Encounter (Signed)
Returned call and pt verified x 2.  Pt informed message received and will be sent to Dr. Arnette Norris, CMA to review and send if appropriate.  Pt verbalized understanding and agreed w/ plan.  Message forwarded to Dr. Arnette Norris, CMA.

## 2014-02-03 ENCOUNTER — Telehealth: Payer: Self-pay | Admitting: *Deleted

## 2014-02-03 NOTE — Telephone Encounter (Signed)
Returned a call to patient. She requests for me to call Dr. Tamala Julian office ( dentist) to inform him that she cannot stop her Brilinta for any procedures.I spoke with Lars Mage at Dr. Thompson Caul office , who informs me that the patient is under the care of a oral surgeon- Dr. Diona Browner. I will call his office to notify of patient's cardiac status.

## 2014-02-03 NOTE — Telephone Encounter (Signed)
Left message @ Dr. Royce Macadamia office that patient cannot stop her Brilinta to have any procedures at this time. She has ecently had some stent placements. Call back if questions. My name and number left on answering machine along with message.

## 2014-02-15 ENCOUNTER — Ambulatory Visit: Payer: Medicare Other | Admitting: Family Medicine

## 2014-02-16 ENCOUNTER — Telehealth: Payer: Self-pay | Admitting: Cardiovascular Disease

## 2014-02-16 MED ORDER — METOPROLOL SUCCINATE ER 100 MG PO TB24: ORAL_TABLET | ORAL | Status: DC

## 2014-02-16 NOTE — Telephone Encounter (Signed)
Refills on metoprolol 100mg  #60 x 5 sent electronically.

## 2014-02-16 NOTE — Telephone Encounter (Signed)
Refills on metoprolol 100mg #60 x 5 sent electronically.  

## 2014-02-16 NOTE — Telephone Encounter (Signed)
Her Metoprolol was increased at her last office visit. She needs a new prescription for Metoprolol 100mg  #60 instead of #30. Please call to [facility]-[phone].

## 2014-02-24 ENCOUNTER — Other Ambulatory Visit: Payer: Self-pay | Admitting: Family Medicine

## 2014-02-27 ENCOUNTER — Encounter: Payer: Self-pay | Admitting: Cardiovascular Disease

## 2014-02-27 NOTE — Progress Notes (Signed)
Patient ID: [name], female   DOB: 09/02/1941, 73 y.o.   MRN: [id number]     HPI: Elizabeth Aguilar is a 73 y.o. female is a history of diabetes mellitus, and hyperlipidemia, and suffered an ST segment elevation myocardial infarction in 12/16/2013 due to with urgent catheterization and stenting of her LAD and staged RCA stenting.  She presents now for followup cardiology evaluation.  Elizabeth Aguilar suffered an ST segment elevation myocardial infarction and was transported from [facility] on 12/16/2013 in the setting of anterolateral MI.  She was taken acutely to the catheterization laboratory, which showed acute LV dysfunction with an ejection fraction of 35-40% with severe hypokinesis involving the mid distal anterolateral wall, apex, and extending to involve the apical, inferior segment.  She had 2+ angiographic mitral regurgitation.  She underwent successful stenting of her total occluded LAD and a 3.0x24 mm Promus premier DES stent was inserted. Due to high grade concomitant CAD, she was brought back to the cardiac catheterization laboratory the following day where LAD stent was widely patent.  Her high-grade focal RCA stenosis of greater than 90% was successfully treated with with ultimate stenting with a 3.0x24 mm Promus premier DES stent, postdilated to 3.26 mm.  She has been on aspirin/Brilinta for dual antiplatelet therapy.  She was discharged with a light fast, February 7 she did have a 5 beat run of VT.  Since hospital discharge, she has been evaluated in the office by Kerin Ransom, [place] on 01/18/14.  She presents now  for followup evaluation with me. She denies recurrent episodes of chest pain.  She does have a tooth abscess.  He ultimately require 5 teeth extractions.  She has her wearing her Life-vest.  She denies recurrent chest tightness.  She is unaware of palpitations or presyncope or syncope.  Past Medical History  Diagnosis Date  . Bronchitis   . Irritable bowel syndrome   .  Polyarthralgia 1998  . Tachycardia   . Prediabetes   . Hyperlipidemia   . Type 2 diabetes mellitus   . Arthralgia     History reviewed. No pertinent past surgical history.  Allergies  Allergen Reactions  . Actos [Pioglitazone] Swelling  . Ceftin [Cefuroxime Axetil] Nausea Only  . Celebrex [Celecoxib]     Sickness  . Darvocet [Propoxyphene N-Acetaminophen] Nausea Only  . Doxycycline   . Erythromycin   . Vioxx [Rofecoxib]     Sickness   . Zithromax [Azithromycin] Nausea Only  . Zocor [Simvastatin]     Side effects    Current Outpatient Prescriptions  Medication Sig Dispense Refill  . ALPRAZolam (XANAX) 0.5 MG tablet Take one BID prn  60 tablet  2  . amoxicillin (AMOXIL) 500 MG tablet Take 4 pills 2 hours before the dental work  4 tablet  2  . aspirin EC 81 MG EC tablet Take 1 tablet (81 mg total) by mouth daily.      [name] atorvastatin (LIPITOR) 40 MG tablet Take 1 tablet (40 mg total) by mouth daily at 6 PM.  30 tablet  6  . colchicine 0.6 MG tablet Take 1 tablet (0.6 mg total) by mouth 2 (two) times daily.  24 tablet  0  . cyclobenzaprine (FLEXERIL) 10 MG tablet Take 1 tablet (10 mg total) by mouth at bedtime as needed for muscle spasms.  30 tablet  6  . HYDROcodone-acetaminophen (NORCO/VICODIN) 5-325 MG per tablet TAKE 1 TABLET 3 TIMES DAILY AS NEEDED FOR PAIN  90 tablet  0  .  ketorolac (ACULAR) 0.5 % ophthalmic solution Place 1 drop into both eyes 4 (four) times daily.      [name] lisinopril (PRINIVIL,ZESTRIL) 5 MG tablet Take 1 tablet (5 mg total) by mouth daily.  30 tablet  5  . loratadine (CLARITIN) 10 MG tablet Take 1 tablet (10 mg total) by mouth daily.  30 tablet  5  . Multiple Vitamin (MULTIVITAMIN) tablet Take 1 tablet by mouth daily.      . nitroGLYCERIN (NITROSTAT) 0.4 MG SL tablet Place 1 tablet (0.4 mg total) under the tongue every 5 (five) minutes x 3 doses as needed for chest pain.  25 tablet  12  . ondansetron (ZOFRAN ODT) 4 MG disintegrating tablet Take 1 tablet (4 mg  total) by mouth every 8 (eight) hours as needed for nausea or vomiting.  28 tablet  0  . spironolactone (ALDACTONE) 25 MG tablet Take 0.5 tablets (12.5 mg total) by mouth daily.  30 tablet  6  . Ticagrelor (BRILINTA) 90 MG TABS tablet Take 1 tablet (90 mg total) by mouth 2 (two) times daily.  60 tablet  11  . vitamin C (ASCORBIC ACID) 500 MG tablet Take 500 mg by mouth daily.      [name] esomeprazole (NEXIUM) 40 MG capsule TAKE 1 CAPSULE EVERY MORNING BEFORE BREAKFAST  30 capsule  1  . fluticasone (FLONASE) 50 MCG/ACT nasal spray USE 2 SPRAYS IN EACH NOSTRIL DAILY  16 g  1  . hydrochlorothiazide (HYDRODIURIL) 25 MG tablet TAKE 1/2 TABLET DAILY  30 tablet  1  . metFORMIN (GLUCOPHAGE) 500 MG tablet TAKE ONE TABLET TWICE A DAY WITH FOOD  60 tablet  1  . metoprolol succinate (TOPROL-XL) 100 MG 24 hr tablet Take one tablet in the AM and 1/2 tablet in the PM.  60 tablet  5   No current facility-administered medications for this visit.    History   Social History  . Marital Status: Widowed    Spouse Name: N/A    Number of Children: N/A  . Years of Education: N/A   Occupational History  . Not on file.   Social History Main Topics  . Smoking status: Never Smoker   . Smokeless tobacco: Not on file  . Alcohol Use: Not on file  . Drug Use: Not on file  . Sexual Activity: Not on file   Other Topics Concern  . Not on file   Social History Narrative  . No narrative on file   Socially, she is widowed for 8 years.  She has 4 children and 3 grandchildren.  She completed eighth grade of education.  There is no history of tobacco use.  Family History  Problem Relation Age of Onset  . Diabetes Mother   . Diabetes Daughter   . Diabetes Son     ROS is negative for fevers, chills or night sweats.   She denies change in weight.  She denies visual changes.  There are no hearing issues.  She does have an abscessed tooth and has been on antibiotics.  She denies shortness of breath.  She denies  palpitations.  She denies recurrent chest tightness.  She is unaware of blood in stool or urine.  She denies nausea, vomiting, or diarrhea.  There is no claudication.  She denies neurologic symptoms.  There are no myalgias.  She denies tremors.  Other comprehensive 14 point system review is negative.  PE BP 118/75  Pulse 104  Ht 5\' 5"  (1.651 m)  Wt 154  lb 11.2 oz (70.171 kg)  BMI 25.74 kg/m2  General: Alert, oriented, no distress.  Skin: normal turgor, no rashes HEENT: Normocephalic, atraumatic. Pupils round and reactive; sclera anicteric;no lid lag. Extraocular muscles intact;; no xanthelasmas. Nose without nasal septal hypertrophy Mouth/Parynx benign; Mallinpatti scale 3 Neck: No JVD, no carotid bruits; normal carotid upstroke Lungs: clear to ausculatation and percussion; no wheezing or rales Chest wall: no tenderness to palpitation Heart: RRR, s1 s2 normal; 1/6 systolic murmur.  no diastolic murmur, rub thrills or heaves Abdomen: soft, nontender; no hepatosplenomehaly, BS+; abdominal aorta nontender and not dilated by palpation. Back: no CVA tenderness Pulses 2+ Extremities: no clubbing cyanosis or edema, Homan's sign negative  Neurologic: grossly nonfocal; cranial nerves grossly normal. Psychologic: normal affect and mood.  ECG (independently read by me): Sinus rhythm at 104 beats per minute with T-wave inversion anterolaterally V2 to V6, as well as lead 1 and L., concordant with her prior myocardial infarction.  LABS:  BMET    Component Value Date/Time   NA 136 01/14/2014 1622   K 4.5 01/14/2014 1622   CL 100 01/14/2014 1622   CO2 28 01/14/2014 1622   GLUCOSE 124* 01/14/2014 1622   BUN 28* 01/14/2014 1622   CREATININE 1.05 01/14/2014 1622   CREATININE 0.94 12/19/2013 0435   CALCIUM 9.8 01/14/2014 1622   GFRNONAA 59* 12/19/2013 0435   GFRAA 68* 12/19/2013 0435     Hepatic Function Panel     Component Value Date/Time   PROT 6.4 12/16/2013 0910   ALBUMIN 3.1* 12/16/2013 0910   AST 25  12/16/2013 0910   ALT 12 12/16/2013 0910   ALKPHOS 96 12/16/2013 0910   BILITOT 0.2* 12/16/2013 0910     CBC    Component Value Date/Time   WBC 10.5 12/20/2013 0634   RBC 3.71* 12/20/2013 0634   HGB 11.2* 12/20/2013 0634   HCT 33.0* 12/20/2013 0634   PLT 323 12/20/2013 0634   MCV 88.9 12/20/2013 0634   MCH 30.2 12/20/2013 0634   MCHC 33.9 12/20/2013 0634   RDW 13.6 12/20/2013 0634   LYMPHSABS 2.0 12/16/2013 0910   MONOABS 0.6 12/16/2013 0910   EOSABS 0.2 12/16/2013 0910   BASOSABS 0.0 12/16/2013 0910     BNP No results found for this basename: probnp    Lipid Panel     Component Value Date/Time   CHOL 123 12/17/2013 0213   TRIG 106 12/17/2013 0213   HDL 39* 12/17/2013 0213   CHOLHDL 3.2 12/17/2013 0213   VLDL 21 12/17/2013 0213   LDLCALC 63 12/17/2013 0213     RADIOLOGY: No results found.    ASSESSMENT AND PLAN: Elizabeth Aguilar is a 73 year old female who suffered an ST segment elevation anterolateral myocardial infarction on February 5 secondary to total occlusion of the LAD.  She did have high-grade concomitant CAD and also underwent stage intervention to her right coronary artery.  She has been without recurrent anginal symptoms.  Pre-discharge.  Her ejection fraction was 25%.  She did have mitral regurgitation.  She has been wearing a light fast.  I have recommended a followup echo Doppler study be obtained in approximately one month reassess potential benefit of LV function.  If her LV function has improved, we will discontinue the lifevest at that time. She needs to be on dual antiplatelet therapy for her and stenting of her LAD and RCA.  If she does require semi-emergent teeth extraction, bridging with 2 B3 inhibitor may be necessary.  I recommended further titration  of her Toprol-XL to 100 mg in the morning and 50 mg at night, particularly with a resting pulse of 104. I will see her in 6 weeks for followup evaluation.    Troy Sine, MD, [facility]  02/27/2014 12:27 PM

## 2014-03-03 ENCOUNTER — Ambulatory Visit (HOSPITAL_COMMUNITY)
Admission: RE | Admit: 2014-03-03 | Discharge: 2014-03-03 | Disposition: A | Discharge status: Home / Self Care | Payer: Medicare Other | Source: Ambulatory Visit | Attending: Cardiovascular Disease | Admitting: Cardiovascular Disease

## 2014-03-03 DIAGNOSIS — I2589 Other forms of chronic ischemic heart disease: Secondary | ICD-10-CM | POA: Diagnosis not present

## 2014-03-03 DIAGNOSIS — I255 Ischemic cardiomyopathy: Secondary | ICD-10-CM

## 2014-03-03 DIAGNOSIS — I519 Heart disease, unspecified: Secondary | ICD-10-CM

## 2014-03-03 NOTE — Progress Notes (Signed)
2D Echo Performed 03/03/2014    Elizabeth Aguilar, RCS

## 2014-03-07 ENCOUNTER — Ambulatory Visit: Payer: Medicare Other | Admitting: Family Medicine

## 2014-03-10 ENCOUNTER — Encounter: Payer: Self-pay | Admitting: Family Medicine

## 2014-03-10 ENCOUNTER — Ambulatory Visit (INDEPENDENT_AMBULATORY_CARE_PROVIDER_SITE_OTHER): Payer: Medicare Other | Admitting: Family Medicine

## 2014-03-10 VITALS — BP 112/66 | Ht 63.0 in | Wt 151.0 lb

## 2014-03-10 DIAGNOSIS — I2589 Other forms of chronic ischemic heart disease: Secondary | ICD-10-CM

## 2014-03-10 DIAGNOSIS — I498 Other specified cardiac arrhythmias: Secondary | ICD-10-CM

## 2014-03-10 DIAGNOSIS — I2109 ST elevation (STEMI) myocardial infarction involving other coronary artery of anterior wall: Secondary | ICD-10-CM

## 2014-03-10 DIAGNOSIS — E785 Hyperlipidemia, unspecified: Secondary | ICD-10-CM

## 2014-03-10 DIAGNOSIS — E119 Type 2 diabetes mellitus without complications: Secondary | ICD-10-CM

## 2014-03-10 DIAGNOSIS — R000 Tachycardia, unspecified: Secondary | ICD-10-CM

## 2014-03-10 DIAGNOSIS — I2102 ST elevation (STEMI) myocardial infarction involving left anterior descending coronary artery: Secondary | ICD-10-CM

## 2014-03-10 LAB — POCT GLYCOSYLATED HEMOGLOBIN (HGB A1C): Hemoglobin A1C: 6.4

## 2014-03-10 MED ORDER — HYDROCODONE-ACETAMINOPHEN 5-325 MG PO TABS: ORAL_TABLET | ORAL | Status: DC

## 2014-03-10 NOTE — Progress Notes (Signed)
   Subjective:    Patient ID: Elizabeth Aguilar, female    DOB: [birth year], 73 y.o.   MRN: [id number]  Diabetes She presents for her follow-up diabetic visit. She has type 2 diabetes mellitus. Pertinent negatives for hypoglycemia include no confusion. Pertinent negatives for diabetes include no chest pain, no fatigue, no polydipsia, no polyphagia and no weakness. She is compliant with treatment all of the time. She is following a generally healthy and diabetic diet. She never participates in exercise. Her dinner blood glucose range is generally 110-130 mg/dl. She does not see a podiatrist.Eye exam is not current.   This patient was seen today for chronic pain  The medication list was reviewed and updated.  Discussion was held with the patient regarding compliance with pain medication. The patient was advised the importance of maintaining medication and not using illegal substances with these. The patient was educated that we can provide 3 monthly scripts for their medication, it is their responsibility to follow the instructions. Discussion was held with the patient to make sure they're not having significant side effects. Patient is aware that pain medications are meant to minimize the severity of the pain to allow their pain levels to improve to allow for better function. They are aware of that pain medications cannot totally remove their pain.   Long discussion held regarding her recent heart attack the unit she is wearing testing she went through questions were answered regarding heart failure and what to watch for   Review of Systems  Constitutional: Negative for activity change, appetite change and fatigue.  HENT: Negative for congestion.   Respiratory: Negative for cough.   Cardiovascular: Negative for chest pain.  Gastrointestinal: Negative for abdominal pain.  Endocrine: Negative for polydipsia and polyphagia.  Genitourinary: Negative for frequency.  Neurological: Negative for  weakness.  Psychiatric/Behavioral: Negative for confusion.       Objective:   Physical Exam  Vitals reviewed. Constitutional: She appears well-nourished. No distress.  Cardiovascular: Normal rate, regular rhythm and normal heart sounds.   No murmur heard. Pulmonary/Chest: Effort normal and breath sounds normal. No respiratory distress.  Musculoskeletal: She exhibits no edema.  Lymphadenopathy:    She has no cervical adenopathy.  Neurological: She is alert. She exhibits normal muscle tone.  Psychiatric: Her behavior is normal.         1 Assessment & Plan:  1. Diabetes A1c looks fabulous she needs to continue what she is doing continue medication watch diet closely - POCT glycosylated hemoglobin (Hb A1C)  2. Diabetes type 2, controlled We will do comprehensive lab work later this year  3. STEMI 12/16/13- LAD DES Currently she's not having any angina. Her recent echo just several days ago showed an improvement in ejection fraction. She will see cardiology next week hopefully they will take off her portable defibrillator.  4. Hyperlipemia She was counseled regarding the importance of watching diet as well as continuing medication and we will keep monitoring her cholesterol the goal would be to get LDL below 70  5. Sinus tachycardia Currently her heart rate is 100. She is to continue on her metoprolol.  She also has chronic pain and discomfort we refill medicines she needs a followup 3 months, greater than 25 minutes spent with patient discussing all these issues

## 2014-03-14 ENCOUNTER — Encounter: Payer: Self-pay | Admitting: Cardiovascular Disease

## 2014-03-14 ENCOUNTER — Ambulatory Visit (INDEPENDENT_AMBULATORY_CARE_PROVIDER_SITE_OTHER): Payer: Medicare Other | Admitting: Cardiovascular Disease

## 2014-03-14 VITALS — BP 110/66 | HR 87 | Ht 63.0 in | Wt 149.5 lb

## 2014-03-14 DIAGNOSIS — I2589 Other forms of chronic ischemic heart disease: Secondary | ICD-10-CM | POA: Diagnosis not present

## 2014-03-14 DIAGNOSIS — Z9861 Coronary angioplasty status: Secondary | ICD-10-CM | POA: Diagnosis not present

## 2014-03-14 DIAGNOSIS — I2510 Atherosclerotic heart disease of native coronary artery without angina pectoris: Secondary | ICD-10-CM | POA: Diagnosis not present

## 2014-03-14 DIAGNOSIS — E785 Hyperlipidemia, unspecified: Secondary | ICD-10-CM

## 2014-03-14 DIAGNOSIS — I10 Essential (primary) hypertension: Secondary | ICD-10-CM

## 2014-03-14 DIAGNOSIS — M25551 Pain in right hip: Secondary | ICD-10-CM

## 2014-03-14 DIAGNOSIS — I2109 ST elevation (STEMI) myocardial infarction involving other coronary artery of anterior wall: Secondary | ICD-10-CM | POA: Diagnosis not present

## 2014-03-14 DIAGNOSIS — I2102 ST elevation (STEMI) myocardial infarction involving left anterior descending coronary artery: Secondary | ICD-10-CM

## 2014-03-14 DIAGNOSIS — E119 Type 2 diabetes mellitus without complications: Secondary | ICD-10-CM | POA: Diagnosis not present

## 2014-03-14 DIAGNOSIS — M25552 Pain in left hip: Secondary | ICD-10-CM

## 2014-03-14 DIAGNOSIS — M25569 Pain in unspecified knee: Secondary | ICD-10-CM

## 2014-03-14 DIAGNOSIS — G8929 Other chronic pain: Secondary | ICD-10-CM

## 2014-03-14 DIAGNOSIS — M25561 Pain in right knee: Secondary | ICD-10-CM

## 2014-03-14 DIAGNOSIS — M25562 Pain in left knee: Secondary | ICD-10-CM

## 2014-03-14 DIAGNOSIS — M25559 Pain in unspecified hip: Secondary | ICD-10-CM

## 2014-03-14 NOTE — Progress Notes (Signed)
Patient ID: Elizabeth Aguilar, female   DOB: 08-14-1941, 73 y.o.   MRN: [id number]      HPI: Elizabeth Aguilar is a 73 y.o. female is a history of diabetes mellitus, and hyperlipidemia, and suffered an ST segment elevation myocardial infarction in 12/16/2013 and underwent urgent catheterization and stenting of her LAD and staged RCA stenting.  I had seen her one month ago, and she now presents in followup of her recent echo Doppler study for further evaluation concerning need for continue wearing a lifevest.  Elizabeth Aguilar suffered an ST segment elevation myocardial infarction and was transported from [facility] on 12/16/2013 in the setting of anterolateral MI.  She was taken acutely to the catheterization laboratory, which showed acute LV dysfunction with an ejection fraction of 35-40% with severe hypokinesis involving the mid distal anterolateral wall, apex, and extending to involve the apical, inferior segment.  She had 2+ angiographic mitral regurgitation.  She underwent successful stenting of her total occluded LAD and a 3.0x24 mm Promus premier DES stent was inserted. Due to high grade concomitant CAD, she was brought back to the cardiac catheterization laboratory the following day where LAD stent was widely patent.  Her high-grade focal RCA stenosis of greater than 90% was successfully treated with with ultimate stenting with a 3.0x24 mm Promus premier DES stent, postdilated to 3.26 mm.  She has been on aspirin/Brilinta for dual antiplatelet therapy.  She was discharged with a Life-vest, February 7 she did have a 5 beat run of VT. .I had seen her 6 weeks ago in followup of her initial post hospital encounter.  At that time, she was doing well.  Her pre-discharge her ejection fraction was 25%.  When I saw her.  Her resting pulse was 104 beats per minute, and I further titrated.  Her Toprol-XL to 100 mg in the morning and 50 mg at night.  She has felt improved on this increase beta blocker regimen.  She is  unaware of tachycardia palpitations.  She denies chest tightness.  She denies PND, orthopnea.  She did undergo a followup echo Doppler study on 03/03/2014.  LV function has improved from 25% to 40% and there was evidence for residual hypokinesis of the distal left ventricle with akinesis of the apex.  Normal LV cavity size.  There was grade 1 diastolic dysfunction.  She presents now for evaluation  Past Medical History  Diagnosis Date  . Bronchitis   . Irritable bowel syndrome   . Polyarthralgia 1998  . Tachycardia   . Prediabetes   . Hyperlipidemia   . Type 2 diabetes mellitus   . Arthralgia     History reviewed. No pertinent past surgical history.  Allergies  Allergen Reactions  . Actos [Pioglitazone] Swelling  . Ceftin [Cefuroxime Axetil] Nausea Only  . Celebrex [Celecoxib]     Sickness  . Darvocet [Propoxyphene N-Acetaminophen] Nausea Only  . Doxycycline   . Erythromycin   . Vioxx [Rofecoxib]     Sickness   . Zithromax [Azithromycin] Nausea Only  . Zocor [Simvastatin]     Side effects    Current Outpatient Prescriptions  Medication Sig Dispense Refill  . ALPRAZolam (XANAX) 0.5 MG tablet Take one BID prn  60 tablet  2  . aspirin EC 81 MG EC tablet Take 1 tablet (81 mg total) by mouth daily.      [name] atorvastatin (LIPITOR) 40 MG tablet Take 1 tablet (40 mg total) by mouth daily at 6 PM.  30 tablet  6  . colchicine 0.6 MG tablet Take 1 tablet (0.6 mg total) by mouth 2 (two) times daily.  24 tablet  0  . esomeprazole (NEXIUM) 40 MG capsule TAKE 1 CAPSULE EVERY MORNING BEFORE BREAKFAST  30 capsule  1  . fluticasone (FLONASE) 50 MCG/ACT nasal spray USE 2 SPRAYS IN EACH NOSTRIL DAILY  16 g  1  . hydrochlorothiazide (HYDRODIURIL) 25 MG tablet TAKE 1/2 TABLET DAILY  30 tablet  1  . HYDROcodone-acetaminophen (NORCO/VICODIN) 5-325 MG per tablet TAKE 1 TABLET 4 TIMES DAILY AS NEEDED FOR PAIN  120 tablet  0  . ketorolac (ACULAR) 0.5 % ophthalmic solution Place 1 drop into both eyes 4  (four) times daily.      [name] lisinopril (PRINIVIL,ZESTRIL) 5 MG tablet Take 1 tablet (5 mg total) by mouth daily.  30 tablet  5  . loratadine (CLARITIN) 10 MG tablet Take 1 tablet (10 mg total) by mouth daily.  30 tablet  5  . metFORMIN (GLUCOPHAGE) 500 MG tablet TAKE ONE TABLET TWICE A DAY WITH FOOD  60 tablet  1  . metoprolol succinate (TOPROL-XL) 100 MG 24 hr tablet Take one tablet in the AM and 1/2 tablet in the PM.  60 tablet  5  . Multiple Vitamin (MULTIVITAMIN) tablet Take 1 tablet by mouth daily.      . nitroGLYCERIN (NITROSTAT) 0.4 MG SL tablet Place 1 tablet (0.4 mg total) under the tongue every 5 (five) minutes x 3 doses as needed for chest pain.  25 tablet  12  . ondansetron (ZOFRAN ODT) 4 MG disintegrating tablet Take 1 tablet (4 mg total) by mouth every 8 (eight) hours as needed for nausea or vomiting.  28 tablet  0  . spironolactone (ALDACTONE) 25 MG tablet Take 0.5 tablets (12.5 mg total) by mouth daily.  30 tablet  6  . Ticagrelor (BRILINTA) 90 MG TABS tablet Take 1 tablet (90 mg total) by mouth 2 (two) times daily.  60 tablet  11  . vitamin C (ASCORBIC ACID) 500 MG tablet Take 500 mg by mouth daily.       No current facility-administered medications for this visit.    History   Social History  . Marital Status: Widowed    Spouse Name: N/A    Number of Children: N/A  . Years of Education: N/A   Occupational History  . Not on file.   Social History Main Topics  . Smoking status: Never Smoker   . Smokeless tobacco: Not on file  . Alcohol Use: Not on file  . Drug Use: Not on file  . Sexual Activity: Not on file   Other Topics Concern  . Not on file   Social History Narrative  . No narrative on file   Socially, she is widowed for 8 years.  She has 4 children and 3 grandchildren.  She completed eighth grade of education.  There is no history of tobacco use.  Family History  Problem Relation Age of Onset  . Diabetes Mother   . Diabetes Daughter   . Diabetes Son       ROS General: Negative; No fevers, chills, or night sweats;  HEENT:  Positive for recent tooth abscess for which he did undergo antibiotic therapy.  no changes in vision or hearing, sinus congestion, difficulty swallowing Pulmonary: Negative; No cough, wheezing, shortness of breath, hemoptysis Cardiovascular: Negative; No chest pain, presyncope, syncope, palpatations GI: Negative; No nausea, vomiting, diarrhea, or abdominal pain GU: Negative; No dysuria, hematuria,  or difficulty voiding Musculoskeletal: Positive for occasional arthralgias of her knees and hips; no myalgias, or weakness Hematologic/Oncology: Negative; no easy bruising, bleeding Endocrine: Positive for type 2 diabetes mellitus.; no heat/cold intolerance Neuro: Negative; no changes in balance, headaches Skin: Negative; No rashes or skin lesions Psychiatric: Negative; No behavioral problems, depression Sleep: Negative; No snoring, daytime sleepiness, hypersomnolence, bruxism, restless legs, hypnogognic hallucinations, no cataplexy Other comprehensive 14 point system review is negative   PE BP 110/66  Pulse 87  Ht 5\' 3"  (1.6 m)  Wt 149 lb 8 oz (67.813 kg)  BMI 26.49 kg/m2  General: Alert, oriented, no distress.  Skin: normal turgor, no rashes HEENT: Normocephalic, atraumatic. Pupils round and reactive; sclera anicteric;no lid lag. Extraocular muscles intact;; no xanthelasmas. Nose without nasal septal hypertrophy Mouth/Parynx benign; Mallinpatti scale 3 Neck: No JVD, no carotid bruits; normal carotid upstroke Lungs: clear to ausculatation and percussion; no wheezing or rales Chest wall: no tenderness to palpitation Heart: RRR, s1 s2 normal; 1/6 systolic murmur.  no diastolic murmur, rub thrills or heaves Abdomen: soft, nontender; no hepatosplenomehaly, BS+; abdominal aorta nontender and not dilated by palpation. Back: no CVA tenderness Pulses 2+ Extremities: no clubbing cyanosis or edema, Homan's sign negative   Neurologic: grossly nonfocal; cranial nerves grossly normal. Psychologic: normal affect and mood.  ECG (independently read by (: Sinus rhythm at 87 beats per minute.  Preserved anterior R waves, but with corneal T-wave inversion  Prior 01/31/2014 ECG (independently read by me): Sinus rhythm at 104 beats per minute with T-wave inversion anterolaterally V2 to V6, as well as lead 1 and L., concordant with her prior myocardial infarction.  LABS:  BMET    Component Value Date/Time   NA 136 01/14/2014 1622   K 4.5 01/14/2014 1622   CL 100 01/14/2014 1622   CO2 28 01/14/2014 1622   GLUCOSE 124* 01/14/2014 1622   BUN 28* 01/14/2014 1622   CREATININE 1.05 01/14/2014 1622   CREATININE 0.94 12/19/2013 0435   CALCIUM 9.8 01/14/2014 1622   GFRNONAA 59* 12/19/2013 0435   GFRAA 68* 12/19/2013 0435     Hepatic Function Panel     Component Value Date/Time   PROT 6.4 12/16/2013 0910   ALBUMIN 3.1* 12/16/2013 0910   AST 25 12/16/2013 0910   ALT 12 12/16/2013 0910   ALKPHOS 96 12/16/2013 0910   BILITOT 0.2* 12/16/2013 0910     CBC    Component Value Date/Time   WBC 10.5 12/20/2013 0634   RBC 3.71* 12/20/2013 0634   HGB 11.2* 12/20/2013 0634   HCT 33.0* 12/20/2013 0634   PLT 323 12/20/2013 0634   MCV 88.9 12/20/2013 0634   MCH 30.2 12/20/2013 0634   MCHC 33.9 12/20/2013 0634   RDW 13.6 12/20/2013 0634   LYMPHSABS 2.0 12/16/2013 0910   MONOABS 0.6 12/16/2013 0910   EOSABS 0.2 12/16/2013 0910   BASOSABS 0.0 12/16/2013 0910     BNP No results found for this basename: probnp    Lipid Panel     Component Value Date/Time   CHOL 123 12/17/2013 0213   TRIG 106 12/17/2013 0213   HDL 39* 12/17/2013 0213   CHOLHDL 3.2 12/17/2013 0213   VLDL 21 12/17/2013 0213   LDLCALC 63 12/17/2013 0213     RADIOLOGY: No results found.    ASSESSMENT AND PLAN: Elizabeth Aguilar is a 73 year old female who suffered an ST segment elevation anterolateral myocardial infarction on February 5 secondary to total occlusion of the LAD.  She did have  high-grade  concomitant CAD and also underwent stage intervention to her right coronary artery.  She has been without recurrent anginal symptoms.  Pre-discharge ejection fraction was 25%.  She did have mitral regurgitation.  Of the past 6 weeks, she has been on an increased dose of beta blocker therapy, and currently is taking a total dose of 150 mg of Toprol daily.  Her resting pulse today is in the 80s over 100.  Her echo Doppler study shows improved LV function, now to approximately 40% with residual distal anterior wall motion abnormality from her infarction.  I have recommended that she can discontinue her life vest.  Her blood pressure today is well controlled.  She is on lipid-lowering therapy with atorvastatin 40 mg.  She is tolerating dual antiplatelet therapy with aspirin and Brilinta.  She is tolerating low dose Glucophage at 500 mg daily for diabetes mellitus. I will see her in 4 months for cardiology followup evaluation or sooner if problems arise.   Troy Sine, MD, [facility]  03/14/2014 5:42 PM

## 2014-03-14 NOTE — Patient Instructions (Signed)
Your physician recommends that you schedule a follow-up appointment in: 4 months.   You can now return the life vest.

## 2014-03-25 ENCOUNTER — Other Ambulatory Visit: Payer: Self-pay | Admitting: Family Medicine

## 2014-03-27 NOTE — Telephone Encounter (Signed)
May refill this one +4 additional refills

## 2014-04-19 ENCOUNTER — Telehealth: Payer: Self-pay | Admitting: *Deleted

## 2014-04-19 NOTE — Telephone Encounter (Signed)
Pt called saying that she has felt "sick" for the past 2 weeks. She has left-sided chest pain for the past 2 days, along with SOB w/ and w/o activity. She also feels like she is retaining fluid in her legs. Pt also c/o of nausea.   With these s/s, I advised the pt to go to the ER and to not wait to be seen by [place] later this evening. Pt verbalized understanding.

## 2014-04-25 ENCOUNTER — Other Ambulatory Visit: Payer: Self-pay | Admitting: Family Medicine

## 2014-06-03 ENCOUNTER — Ambulatory Visit (INDEPENDENT_AMBULATORY_CARE_PROVIDER_SITE_OTHER): Payer: Medicare Other | Admitting: Family Medicine

## 2014-06-03 ENCOUNTER — Encounter: Payer: Self-pay | Admitting: Family Medicine

## 2014-06-03 VITALS — BP 118/82 | Ht 63.0 in | Wt 146.4 lb

## 2014-06-03 DIAGNOSIS — E119 Type 2 diabetes mellitus without complications: Secondary | ICD-10-CM

## 2014-06-03 DIAGNOSIS — I2589 Other forms of chronic ischemic heart disease: Secondary | ICD-10-CM

## 2014-06-03 DIAGNOSIS — I10 Essential (primary) hypertension: Secondary | ICD-10-CM

## 2014-06-03 DIAGNOSIS — M899 Disorder of bone, unspecified: Secondary | ICD-10-CM | POA: Diagnosis not present

## 2014-06-03 DIAGNOSIS — M8580 Other specified disorders of bone density and structure, unspecified site: Secondary | ICD-10-CM | POA: Insufficient documentation

## 2014-06-03 DIAGNOSIS — M949 Disorder of cartilage, unspecified: Secondary | ICD-10-CM | POA: Diagnosis not present

## 2014-06-03 DIAGNOSIS — M549 Dorsalgia, unspecified: Secondary | ICD-10-CM | POA: Diagnosis not present

## 2014-06-03 DIAGNOSIS — G8929 Other chronic pain: Secondary | ICD-10-CM

## 2014-06-03 DIAGNOSIS — I951 Orthostatic hypotension: Secondary | ICD-10-CM

## 2014-06-03 MED ORDER — HYDROCODONE-ACETAMINOPHEN 5-325 MG PO TABS: ORAL_TABLET | ORAL | Status: DC

## 2014-06-03 NOTE — Progress Notes (Signed)
   Subjective:    Patient ID: Elizabeth Aguilar, female    DOB: February 15, 1941, 73 y.o.   MRN: [id number]  Diabetes She presents for her follow-up diabetic visit. She has type 2 diabetes mellitus. Diabetic complications include heart disease. Risk factors for coronary artery disease include diabetes mellitus, dyslipidemia and hypertension. Current diabetic treatment includes oral agent (monotherapy). She is compliant with treatment all of the time. She is following a diabetic diet. She rarely participates in exercise. She does not see a podiatrist.Eye exam is not current.   This patient was seen today for chronic pain  The medication list was reviewed and updated.   -Compliance with pain medication: good  The patient was advised the importance of maintaining medication and not using illegal substances with these.  Refills needed: yes  The patient was educated that we can provide 3 monthly scripts for their medication, it is their responsibility to follow the instructions.  Side effects or complications from medications: none  Patient is aware that pain medications are meant to minimize the severity of the pain to allow their pain levels to improve to allow for better function. They are aware of that pain medications cannot totally remove their pain.  Due for UDT ( at least once per year) : this fall        Review of Systems She denies any chest tightness pressure pain shortness breath she does relate fatigue tiredness and dizziness when she stands.    Objective:   Physical Exam  Lungs clear hearts regular pulse normal extremities no edema diabetic foot exam done patient is orthostatic. Blood pressure 110/70 sitting 90/60 standing     Assessment & Plan:  Chronic pain prescriptions given no particular problems otherwise  Diabetes she will do her A1c next week it is too early to do it she is followed her diet and take her medication  Orthostatic hypotension reduce metoprolol to just  half a tablet once daily of the extended release 100 mg. Recheck blood pressure again in 3-4 weeks.  Lab work pending we'll followup on this.

## 2014-06-07 ENCOUNTER — Other Ambulatory Visit (HOSPITAL_COMMUNITY): Payer: Self-pay | Admitting: Physician Assistant

## 2014-06-07 NOTE — Telephone Encounter (Signed)
Rx was sent to pharmacy electronically. 

## 2014-06-08 SURGERY — Surgical Case
Anesthesia: *Unknown

## 2014-06-09 ENCOUNTER — Ambulatory Visit (HOSPITAL_COMMUNITY)
Admission: RE | Admit: 2014-06-09 | Discharge: 2014-06-09 | Disposition: A | Discharge status: Home / Self Care | Payer: Medicare Other | Source: Ambulatory Visit | Attending: Family Medicine | Admitting: Family Medicine

## 2014-06-09 DIAGNOSIS — M899 Disorder of bone, unspecified: Secondary | ICD-10-CM | POA: Diagnosis not present

## 2014-06-09 DIAGNOSIS — M949 Disorder of cartilage, unspecified: Principal | ICD-10-CM

## 2014-06-09 DIAGNOSIS — Z780 Asymptomatic menopausal state: Secondary | ICD-10-CM | POA: Diagnosis not present

## 2014-06-14 MED ORDER — ALENDRONATE SODIUM 70 MG PO TABS: 70.0000 mg | ORAL_TABLET | ORAL | Status: DC

## 2014-06-14 NOTE — Progress Notes (Signed)
Patient notified and verbalized understanding of the test results. No further questions. 

## 2014-06-14 NOTE — Addendum Note (Signed)
Addended byCharolotte Capuchin D on: 06/14/2014 09:51 AM   Modules accepted: Orders

## 2014-06-21 ENCOUNTER — Ambulatory Visit (INDEPENDENT_AMBULATORY_CARE_PROVIDER_SITE_OTHER): Payer: Medicare Other | Admitting: Family Medicine

## 2014-06-21 ENCOUNTER — Other Ambulatory Visit: Payer: Self-pay | Admitting: Family Medicine

## 2014-06-21 ENCOUNTER — Telehealth: Payer: Self-pay | Admitting: Family Medicine

## 2014-06-21 ENCOUNTER — Encounter: Payer: Self-pay | Admitting: Family Medicine

## 2014-06-21 VITALS — BP 120/62 | Ht 63.0 in | Wt 135.6 lb

## 2014-06-21 DIAGNOSIS — IMO0001 Reserved for inherently not codable concepts without codable children: Secondary | ICD-10-CM

## 2014-06-21 DIAGNOSIS — I10 Essential (primary) hypertension: Secondary | ICD-10-CM | POA: Diagnosis not present

## 2014-06-21 DIAGNOSIS — E119 Type 2 diabetes mellitus without complications: Secondary | ICD-10-CM | POA: Diagnosis not present

## 2014-06-21 DIAGNOSIS — R634 Abnormal weight loss: Secondary | ICD-10-CM

## 2014-06-21 DIAGNOSIS — M25559 Pain in unspecified hip: Secondary | ICD-10-CM

## 2014-06-21 DIAGNOSIS — I2589 Other forms of chronic ischemic heart disease: Secondary | ICD-10-CM | POA: Diagnosis not present

## 2014-06-21 DIAGNOSIS — D509 Iron deficiency anemia, unspecified: Secondary | ICD-10-CM | POA: Diagnosis not present

## 2014-06-21 LAB — CBC WITH DIFFERENTIAL/PLATELET
Basophils Absolute: 0 10*3/uL (ref 0.0–0.1)
Basophils Relative: 0 % (ref 0–1)
EOS ABS: 0.1 10*3/uL (ref 0.0–0.7)
EOS PCT: 1 % (ref 0–5)
HCT: 37.4 % (ref 36.0–46.0)
HEMOGLOBIN: 12.7 g/dL (ref 12.0–15.0)
LYMPHS ABS: 2.8 10*3/uL (ref 0.7–4.0)
Lymphocytes Relative: 24 % (ref 12–46)
MCH: 30 pg (ref 26.0–34.0)
MCHC: 34 g/dL (ref 30.0–36.0)
MCV: 88.2 fL (ref 78.0–100.0)
MONO ABS: 0.6 10*3/uL (ref 0.1–1.0)
Monocytes Relative: 5 % (ref 3–12)
NEUTROS PCT: 70 % (ref 43–77)
Neutro Abs: 8.2 10*3/uL — ABNORMAL HIGH (ref 1.7–7.7)
Platelets: 437 10*3/uL — ABNORMAL HIGH (ref 150–400)
RBC: 4.24 MIL/uL (ref 3.87–5.11)
RDW: 14.4 % (ref 11.5–15.5)
WBC: 11.7 10*3/uL — ABNORMAL HIGH (ref 4.0–10.5)

## 2014-06-21 MED ORDER — ONDANSETRON HCL 8 MG PO TABS: 8.0000 mg | ORAL_TABLET | Freq: Three times a day (TID) | ORAL | Status: DC | PRN

## 2014-06-21 MED ORDER — LISINOPRIL 2.5 MG PO TABS: 2.5000 mg | ORAL_TABLET | Freq: Every day | ORAL | Status: DC

## 2014-06-21 NOTE — Telephone Encounter (Signed)
Patient wants a nurse to call her back because she feels her blood pressure is too low and heart rate too high. And she just felt sick last night.

## 2014-06-21 NOTE — Progress Notes (Signed)
   Subjective:    Patient ID: Elizabeth Aguilar, female    DOB: [birth year], 73 y.o.   MRN: [id number]  HPI Patient states she has continued to fel nauseated and not able to eat or drink since last visit- Patient states this has been over 2 weeks-Patient has lost 11 lbs since last visit 06/03/14. Patient state she just feels so sick se cant eat or drink and last night her pulse was 116 and her BP was low. Patient states she has 2 very large bruises on stomach and side but no history of injury. This patient relates that she's been feeling stressed out. Her daughter recently was put in jail because of stealing and drugs. Her son is been having some mental health issues related to strokes. Patient states she finds herself worried anxious not wanting to eat. She denies abdominal pain she denies rectal bleeding denies hematuria denies chest tightness pressure pain she does state at times she feels like her heart is racing she also states at times he feels dizzy when she stands up. Review of Systems    please see above. Denies fever cough wheezing denies diarrhea. Denies joint pain denies rash or tick bites Objective:   Physical Exam Her lungs are clear hearts regular pulses normal abdomen is soft blood pressure taken laying sitting standing shows slight drop.       Assessment & Plan:  Severe nausea fatigue tiredness not feeling good Will check some lab work. In addition to this I am concerned about her weight loss she states is because she's very stressed not sleeping well because of her daughter being in jail and son having strokes with mental status changes. Patient will followup again with [place] in one month's time to see how her weight is doing. May need further intervention based on testing. Should be noted that the patient has refused colonoscopy in the past. Patient denies being depressed but she does admit to being worried. We will reduce Glucophage to a half a tablet twice a day. Check lab work.  Will  go ahead and reduce some of her medicines to see if that would this will help her feel better  This patient may need further interventions including possibility of endoscopy/colonoscopy/CAT scan will follow her up again within a couple weeks' time. Patient has refused colonoscopy in the past. 25 minutes spent with patient

## 2014-06-21 NOTE — Telephone Encounter (Signed)
bp 162/67 and hr 116 last night. Nausea today. Consult with Dr Nicki Reaper. appt this afternoon. Pt transferred to front to schedule appt.

## 2014-06-22 LAB — BASIC METABOLIC PANEL
BUN: 33 mg/dL — ABNORMAL HIGH (ref 6–23)
CALCIUM: 10.4 mg/dL (ref 8.4–10.5)
CO2: 28 meq/L (ref 19–32)
CREATININE: 1.63 mg/dL — AB (ref 0.50–1.10)
Chloride: 102 mEq/L (ref 96–112)
Glucose, Bld: 117 mg/dL — ABNORMAL HIGH (ref 70–99)
Potassium: 5.2 mEq/L (ref 3.5–5.3)
SODIUM: 141 meq/L (ref 135–145)

## 2014-06-22 LAB — HEMOGLOBIN A1C
Hgb A1c MFr Bld: 6.6 % — ABNORMAL HIGH (ref <5.7)
Mean Plasma Glucose: 143 mg/dL — ABNORMAL HIGH (ref <117)

## 2014-06-22 LAB — IRON AND TIBC
%SAT: 19 % — ABNORMAL LOW (ref 20–55)
IRON: 52 ug/dL (ref 42–145)
TIBC: 276 ug/dL (ref 250–470)
UIBC: 224 ug/dL (ref 125–400)

## 2014-06-22 LAB — FERRITIN: FERRITIN: 194 ng/mL (ref 10–291)

## 2014-06-23 ENCOUNTER — Other Ambulatory Visit: Payer: Self-pay | Admitting: *Deleted

## 2014-06-23 DIAGNOSIS — I10 Essential (primary) hypertension: Secondary | ICD-10-CM

## 2014-06-23 DIAGNOSIS — Z79899 Other long term (current) drug therapy: Secondary | ICD-10-CM

## 2014-06-23 NOTE — Addendum Note (Signed)
Addended byCharolotte Capuchin D on: 06/23/2014 11:55 AM   Modules accepted: Orders, Medications

## 2014-06-23 NOTE — Addendum Note (Signed)
Addended byCharolotte Capuchin D on: 06/23/2014 11:54 AM   Modules accepted: Orders

## 2014-06-27 ENCOUNTER — Telehealth: Payer: Self-pay | Admitting: Family Medicine

## 2014-06-27 NOTE — Telephone Encounter (Signed)
Patient said that she was told to stop a pill last week that she was taking, but she forgot which one it was. Please advise.

## 2014-06-27 NOTE — Telephone Encounter (Signed)
Patient states she found her AVS with the information on it and decreased and stopped meds as directed.

## 2014-06-27 NOTE — Telephone Encounter (Signed)
Several things ( all of these were in EPIC and should  Have been on her printout) 1- decreased Prinivil- stopped 5mg  changed to 2.5 mg, 2- metformin reduced to 1/2 bid , 3- toprol xl reduced to 1/2 daily (50 mg), 4- stopped aldactone. F/u as planned

## 2014-06-29 DIAGNOSIS — I10 Essential (primary) hypertension: Secondary | ICD-10-CM | POA: Diagnosis not present

## 2014-06-30 LAB — BASIC METABOLIC PANEL
BUN: 21 mg/dL (ref 6–23)
CALCIUM: 9.8 mg/dL (ref 8.4–10.5)
CO2: 29 meq/L (ref 19–32)
Chloride: 99 mEq/L (ref 96–112)
Creat: 1.46 mg/dL — ABNORMAL HIGH (ref 0.50–1.10)
Glucose, Bld: 160 mg/dL — ABNORMAL HIGH (ref 70–99)
POTASSIUM: 4.5 meq/L (ref 3.5–5.3)
SODIUM: 139 meq/L (ref 135–145)

## 2014-07-04 ENCOUNTER — Ambulatory Visit (INDEPENDENT_AMBULATORY_CARE_PROVIDER_SITE_OTHER): Payer: Medicare Other | Admitting: Family Medicine

## 2014-07-04 ENCOUNTER — Encounter: Payer: Self-pay | Admitting: Family Medicine

## 2014-07-04 VITALS — BP 100/62 | Ht 63.0 in | Wt 143.0 lb

## 2014-07-04 DIAGNOSIS — I2589 Other forms of chronic ischemic heart disease: Secondary | ICD-10-CM

## 2014-07-04 DIAGNOSIS — I10 Essential (primary) hypertension: Secondary | ICD-10-CM | POA: Diagnosis not present

## 2014-07-04 MED ORDER — ONDANSETRON HCL 8 MG PO TABS: 8.0000 mg | ORAL_TABLET | Freq: Three times a day (TID) | ORAL | Status: DC | PRN

## 2014-07-04 MED ORDER — PENICILLIN V POTASSIUM 500 MG PO TABS: 500.0000 mg | ORAL_TABLET | Freq: Four times a day (QID) | ORAL | Status: DC

## 2014-07-04 NOTE — Progress Notes (Signed)
   Subjective:    Patient ID: Elizabeth Aguilar, female    DOB: August 16, 1941, 73 y.o.   MRN: [id number]  HPIFollow up on test results. Had met 7 on 06/29/14. Aldactone was stopped because of kidney function.   BP has been running low. We had adjusted her medications her repeat kidney function showed improvement of creatinine she denies dizziness when she stands  Requesting refill on zofran. She uses this for intermittent nausea she denies abdominal pain denies rectal bleeding  Requesting antibiotic for abscess tooth. She states this has been bothering her over the past 10 days she will see her cardiologist to get their approval to have tooth pulled    Review of Systems  Constitutional: Negative for activity change, appetite change and fatigue.  Respiratory: Negative for cough and shortness of breath.   Cardiovascular: Negative for chest pain.  Gastrointestinal: Negative for abdominal pain.  Endocrine: Negative for polydipsia and polyphagia.  Genitourinary: Negative for frequency.  Neurological: Negative for weakness.  Psychiatric/Behavioral: Negative for confusion.       Objective:   Physical Exam  Vitals reviewed. Constitutional: She appears well-nourished. No distress.  Cardiovascular: Normal rate, regular rhythm and normal heart sounds.   No murmur heard. Pulmonary/Chest: Effort normal and breath sounds normal. No respiratory distress.  Musculoskeletal: She exhibits no edema.  Lymphadenopathy:    She has no cervical adenopathy.  Neurological: She is alert. She exhibits normal muscle tone.  Psychiatric: Her behavior is normal.          Assessment & Plan:  We reduced her medications from last visit. Her creatinine is slightly improved. Her blood pressure is good both laying sitting and standing. She denies any cardiac symptoms. She will followup with cardiology and will followup with [place] in 3 months.  Over the past several days he is made a point of eating better and her  weight has improved.  I have encouraged this patient to get a colonoscopy but she is currently on a blood thinner and she states she will need to see the cardiologist to get his blessing.

## 2014-07-19 ENCOUNTER — Encounter: Payer: Self-pay | Admitting: Cardiovascular Disease

## 2014-07-19 ENCOUNTER — Ambulatory Visit (INDEPENDENT_AMBULATORY_CARE_PROVIDER_SITE_OTHER): Payer: Medicare Other | Admitting: Cardiovascular Disease

## 2014-07-19 VITALS — BP 122/60 | HR 87 | Ht 63.0 in | Wt 144.2 lb

## 2014-07-19 DIAGNOSIS — I2510 Atherosclerotic heart disease of native coronary artery without angina pectoris: Secondary | ICD-10-CM | POA: Diagnosis not present

## 2014-07-19 DIAGNOSIS — E119 Type 2 diabetes mellitus without complications: Secondary | ICD-10-CM | POA: Diagnosis not present

## 2014-07-19 DIAGNOSIS — E785 Hyperlipidemia, unspecified: Secondary | ICD-10-CM

## 2014-07-19 DIAGNOSIS — I2589 Other forms of chronic ischemic heart disease: Secondary | ICD-10-CM | POA: Diagnosis not present

## 2014-07-19 DIAGNOSIS — I10 Essential (primary) hypertension: Secondary | ICD-10-CM

## 2014-07-19 DIAGNOSIS — Z9861 Coronary angioplasty status: Secondary | ICD-10-CM | POA: Diagnosis not present

## 2014-07-19 DIAGNOSIS — M899 Disorder of bone, unspecified: Secondary | ICD-10-CM

## 2014-07-19 DIAGNOSIS — M8580 Other specified disorders of bone density and structure, unspecified site: Secondary | ICD-10-CM

## 2014-07-19 DIAGNOSIS — M949 Disorder of cartilage, unspecified: Secondary | ICD-10-CM

## 2014-07-19 DIAGNOSIS — I2102 ST elevation (STEMI) myocardial infarction involving left anterior descending coronary artery: Secondary | ICD-10-CM

## 2014-07-19 DIAGNOSIS — I2109 ST elevation (STEMI) myocardial infarction involving other coronary artery of anterior wall: Secondary | ICD-10-CM | POA: Diagnosis not present

## 2014-07-19 MED ORDER — METOPROLOL SUCCINATE ER 50 MG PO TB24: ORAL_TABLET | ORAL | Status: DC

## 2014-07-19 NOTE — Patient Instructions (Signed)
Your physician has recommended you make the following change in your medication: increase the metoprolol succ to 75 mg daily .   Your physician wants you to follow-up in: 6 months You will receive a reminder letter in the mail two months in advance. If you don't receive a letter, please call our office to schedule the follow-up appointment.

## 2014-07-19 NOTE — Progress Notes (Signed)
Patient ID: Elizabeth Aguilar, female   DOB: [birth year], 73 y.o.   MRN: [id number]      HPI: Elizabeth Aguilar is a 73 y.o. female whosuffered an ST segment elevation myocardial infarction in 12/16/2013 and underwent urgent catheterization and stenting of her LAD and staged RCA stenting.  I last saw her in May 2015.  She presents now for 4 month followup cardiologic evaluation  Elizabeth Aguilar suffered an ST segment elevation myocardial infarction and was transported from [facility] on 12/16/2013 in the setting of anterolateral MI.  She was taken acutely to the catheterization laboratory, which showed acute LV dysfunction with an ejection fraction of 35-40% with severe hypokinesis involving the mid distal anterolateral wall, apex, and extending to involve the apical, inferior segment.  She had 2+ angiographic mitral regurgitation.  She underwent successful stenting of her total occluded LAD and a 3.0x24 mm Promus premier DES stent was inserted. Due to high grade concomitant CAD, she was brought back to the cardiac catheterization laboratory the following day where LAD stent was widely patent.  Her high-grade focal RCA stenosis of greater than 90% was successfully treated with with ultimate stenting with a 3.0x24 mm Promus premier DES stent, postdilated to 3.26 mm.  She has been on aspirin/Brilinta for dual antiplatelet therapy.  She was discharged with a Life-vest, February 7 she did have a 5 beat run of VT. .I had seen her 6 weeks ago in followup of her initial post hospital encounter.  At that time, she was doing well.  Her pre-discharge her ejection fraction was 25%.  When I saw her resting pulse was 104 beats per minute, and I further titrate  Toprol-XL to 100 mg in the morning and 50 mg at night.  She has felt improved on this increase beta blocker regimen.  She is unaware of tachycardia palpitations.  She denies chest tightness.  She denies PND, orthopnea.  She did undergo a followup echo Doppler study on  03/03/2014.  LV function has improved from 25% to 40% and there was evidence for residual hypokinesis of the distal left ventricle with akinesis of the apex.  Normal LV cavity size.  There was grade 1 diastolic dysfunction.    With improvement in her LV function, we discontinued her lifevest.  Since I last saw her, she tells me she developed a significant GI illness approximately 4-6 weeks ago and lost proximally 10-12 pounds.  At that time, her blood pressure became low and she was taken off her spironolactone and apparently her metoprolol succinate was reduced to just 50 mg.  She is unaware of palpitations.  She does note her pulse is somewhat faster.  She denies recurrent chest pain.  She presents for evaluation.  Additional problems include type 2 diabetes mellitus.  Apparently, she may have developed some mild renal insufficiency, which he had lost fluids and await further GI illness.  She also is a history of hyperlipidemia.  Past Medical History  Diagnosis Date  . Bronchitis   . Irritable bowel syndrome   . Polyarthralgia 1998  . Tachycardia   . Prediabetes   . Hyperlipidemia   . Type 2 diabetes mellitus   . Arthralgia     No past surgical history on file.  Allergies  Allergen Reactions  . Actos [Pioglitazone] Swelling  . Ceftin [Cefuroxime Axetil] Nausea Only  . Celebrex [Celecoxib]     Sickness  . Darvocet [Propoxyphene N-Acetaminophen] Nausea Only  . Doxycycline   . Erythromycin   .  Vioxx [Rofecoxib]     Sickness   . Zithromax [Azithromycin] Nausea Only  . Zocor [Simvastatin]     Side effects    Current Outpatient Prescriptions  Medication Sig Dispense Refill  . alendronate (FOSAMAX) 70 MG tablet Take 1 tablet (70 mg total) by mouth once a week. Take with a full glass of water on an empty stomach.  4 tablet  12  . ALPRAZolam (XANAX) 0.5 MG tablet TAKE ONE TABLET TWICE DAILY AS NEEDED  60 tablet  4  . aspirin EC 81 MG EC tablet Take 1 tablet (81 mg total) by mouth  daily.      [name] atorvastatin (LIPITOR) 40 MG tablet TAKE 1 TABLET DAILY AT 6PM  30 tablet  6  . esomeprazole (NEXIUM) 40 MG capsule TAKE 1 CAPSULE EVERY MORNING BEFORE BREAKFAST  30 capsule  5  . fluticasone (FLONASE) 50 MCG/ACT nasal spray USE 2 SPRAYS IN EACH NOSTRIL DAILY  16 g  5  . hydrochlorothiazide (HYDRODIURIL) 25 MG tablet TAKE 1/2 TABLET DAILY  15 tablet  5  . HYDROcodone-acetaminophen (NORCO/VICODIN) 5-325 MG per tablet TAKE 1 TABLET 4 TIMES DAILY AS NEEDED FOR PAIN  120 tablet  0  . ketorolac (ACULAR) 0.5 % ophthalmic solution Place 1 drop into both eyes 4 (four) times daily.      [name] lisinopril (PRINIVIL,ZESTRIL) 2.5 MG tablet Take 1 tablet (2.5 mg total) by mouth daily.  30 tablet  5  . loratadine (CLARITIN) 10 MG tablet TAKE ONE (1) TABLET EACH DAY  30 tablet  5  . metFORMIN (GLUCOPHAGE) 500 MG tablet 0.5 tablet twice a day      . metoprolol succinate (TOPROL-XL) 100 MG 24 hr tablet 0.5 tablet each am      . Multiple Vitamin (MULTIVITAMIN) tablet Take 1 tablet by mouth daily.      . nitroGLYCERIN (NITROSTAT) 0.4 MG SL tablet Place 1 tablet (0.4 mg total) under the tongue every 5 (five) minutes x 3 doses as needed for chest pain.  25 tablet  12  . Ticagrelor (BRILINTA) 90 MG TABS tablet Take 1 tablet (90 mg total) by mouth 2 (two) times daily.  60 tablet  11  . vitamin C (ASCORBIC ACID) 500 MG tablet Take 500 mg by mouth daily.       No current facility-administered medications for this visit.    History   Social History  . Marital Status: Widowed    Spouse Name: N/A    Number of Children: N/A  . Years of Education: N/A   Occupational History  . Not on file.   Social History Main Topics  . Smoking status: Never Smoker   . Smokeless tobacco: Not on file  . Alcohol Use: Not on file  . Drug Use: Not on file  . Sexual Activity: Not on file   Other Topics Concern  . Not on file   Social History Narrative  . No narrative on file   Socially, she is widowed for 8 years.   She has 4 children and 3 grandchildren.  She completed eighth grade of education.  There is no history of tobacco use.  Family History  Problem Relation Age of Onset  . Diabetes Mother   . Diabetes Daughter   . Diabetes Son     ROS General: Negative; No fevers, chills, or night sweats;  HEENT:  Positive for recent tooth abscess for which he did undergo antibiotic therapy.  no changes in vision or hearing, sinus congestion,  difficulty swallowing Pulmonary: Negative; No cough, wheezing, shortness of breath, hemoptysis Cardiovascular: Negative; No chest pain, presyncope, syncope, palpatations GI: Recent GI illness, resolved ; No nausea, vomiting, diarrhea, or abdominal pain GU: Negative; No dysuria, hematuria, or difficulty voiding Musculoskeletal: Positive for occasional arthralgias of her knees and hips; no myalgias, or weakness Hematologic/Oncology: Negative; no easy bruising, bleeding Endocrine: Positive for type 2 diabetes mellitus.; no heat/cold intolerance Neuro: Negative; no changes in balance, headaches Skin: Negative; No rashes or skin lesions Psychiatric: Negative; No behavioral problems, depression Sleep: Negative; No snoring, daytime sleepiness, hypersomnolence, bruxism, restless legs, hypnogognic hallucinations, no cataplexy Other comprehensive 14 point system review is negative   PE BP 122/60  Pulse 87  Ht 5\' 3"  (1.6 m)  Wt 144 lb 3.2 oz (65.409 kg)  BMI 25.55 kg/m2  General: Alert, oriented, no distress.  Skin: normal turgor, no rashes HEENT: Normocephalic, atraumatic. Pupils round and reactive; sclera anicteric;no lid lag. Extraocular muscles intact;; no xanthelasmas. Nose without nasal septal hypertrophy Mouth/Parynx benign; Mallinpatti scale 3 Neck: No JVD, no carotid bruits; normal carotid upstroke Lungs: clear to ausculatation and percussion; no wheezing or rales Chest wall: no tenderness to palpitation Heart: RRR, s1 s2 normal; 1/6 systolic murmur.  no  diastolic murmur, rub thrills or heaves Abdomen: soft, nontender; no hepatosplenomehaly, BS+; abdominal aorta nontender and not dilated by palpation. Back: no CVA tenderness Pulses 2+ Extremities: no clubbing cyanosis or edema, Homan's sign negative  Neurologic: grossly nonfocal; cranial nerves grossly normal. Psychologic: normal affect and mood.  ECG (independently read by me): Sinus rhythm at 87 beats per minute.  Isolated PVC.  QTc interval 460 ms.  Nonspecific ST changes.  03/14/2014 ECG (independently read by me): Sinus rhythm at 87 beats per minute.  Preserved anterior R waves, but with corneal T-wave inversion  Prior 01/31/2014 ECG (independently read by me): Sinus rhythm at 104 beats per minute with T-wave inversion anterolaterally V2 to V6, as well as lead 1 and L., concordant with her prior myocardial infarction.  LABS:  BMET    Component Value Date/Time   NA 139 06/29/2014 1421   K 4.5 06/29/2014 1421   CL 99 06/29/2014 1421   CO2 29 06/29/2014 1421   GLUCOSE 160* 06/29/2014 1421   BUN 21 06/29/2014 1421   CREATININE 1.46* 06/29/2014 1421   CREATININE 0.94 12/19/2013 0435   CALCIUM 9.8 06/29/2014 1421   GFRNONAA 59* 12/19/2013 0435   GFRAA 68* 12/19/2013 0435     Hepatic Function Panel     Component Value Date/Time   PROT 6.4 12/16/2013 0910   ALBUMIN 3.1* 12/16/2013 0910   AST 25 12/16/2013 0910   ALT 12 12/16/2013 0910   ALKPHOS 96 12/16/2013 0910   BILITOT 0.2* 12/16/2013 0910     CBC    Component Value Date/Time   WBC 11.7* 06/21/2014 1735   RBC 4.24 06/21/2014 1735   HGB 12.7 06/21/2014 1735   HCT 37.4 06/21/2014 1735   PLT 437* 06/21/2014 1735   MCV 88.2 06/21/2014 1735   MCH 30.0 06/21/2014 1735   MCHC 34.0 06/21/2014 1735   RDW 14.4 06/21/2014 1735   LYMPHSABS 2.8 06/21/2014 1735   MONOABS 0.6 06/21/2014 1735   EOSABS 0.1 06/21/2014 1735   BASOSABS 0.0 06/21/2014 1735     BNP No results found for this basename: probnp    Lipid Panel     Component Value Date/Time    CHOL 123 12/17/2013 0213   TRIG 106 12/17/2013 0213   HDL 39*  12/17/2013 0213   CHOLHDL 3.2 12/17/2013 0213   VLDL 21 12/17/2013 0213   LDLCALC 63 12/17/2013 0213     RADIOLOGY: No results found.    ASSESSMENT AND PLAN: Elizabeth Aguilar is a 73 year old female who suffered an ST segment elevation anterolateral myocardial infarction on December 16, 2013 secondary to total occlusion of the LAD.  She had high-grade concomitant CAD and also underwent stage intervention to her right coronary artery.  She has been without recurrent anginal symptoms.  Pre-discharge ejection fraction was 25%.  She did have mitral regurgitation.    Her subsequent echo Doppler study shows improved LV function at  40% with residual distal anterior wall motion abnormality from her infarction.  Apparently, since I last saw her she no longer is taking her spironolactone and has had significant reduction in her beta blocker therapy.  Her resting pulse one ECG today is 87, but on auscultation was in the 90s.  I am changing her Toprol to 50 mg tablet am recommending that she increase her current 50 mg dosing to 75 mg daily.  She's not having anginal symptoms.  She is on atorvastatin 40 mg for hyperlipidemia and is stable.  She continues to be dual anti-platelet therapy for her stents and MI and is on aspirin/Brilinta and is tolerating this well without bleeding.  She is now on a reduced dose of metformin half a tablet twice a day for diabetes mellitus.  She is on Nexium 40 mg for GERD.  She does have osteoporosis for which she is on Fosamax.  I will see her in 6 months for cardiology reevaluation or sooner if problems occur.   Troy Sine, MD, [facility]  07/19/2014 3:16 PM

## 2014-07-21 ENCOUNTER — Telehealth: Payer: Self-pay | Admitting: Family Medicine

## 2014-07-21 NOTE — Telephone Encounter (Signed)
Patient says that her blood pressure was lowered by Dr. Nicki Reaper about 3 weeks ago and she felt really good, but now the heart doctor has increased it again and she doesn't feel well. Please advise.

## 2014-07-22 NOTE — Telephone Encounter (Signed)
Left message to return call 

## 2014-07-22 NOTE — Telephone Encounter (Signed)
Discussed with patient. She states she will call her cardiologist and discuss.

## 2014-07-22 NOTE — Telephone Encounter (Signed)
I would highly encourage that she called back to her cardiologist's office and let them know how  she is doing on on the increased dose. Hopefully they will work with her. In my experience this patient has not been able to tolerate the higher doses. I believe the cardiologist has her overall health in his best interests but this needs to be balanced with how the patient is doing with side effects

## 2014-08-01 ENCOUNTER — Encounter: Payer: Self-pay | Admitting: Family Medicine

## 2014-08-01 ENCOUNTER — Ambulatory Visit (INDEPENDENT_AMBULATORY_CARE_PROVIDER_SITE_OTHER): Payer: Medicare Other | Admitting: Family Medicine

## 2014-08-01 VITALS — BP 110/70 | Temp 97.7°F | Ht 63.0 in | Wt 145.2 lb

## 2014-08-01 DIAGNOSIS — L0390 Cellulitis, unspecified: Principal | ICD-10-CM

## 2014-08-01 DIAGNOSIS — I2589 Other forms of chronic ischemic heart disease: Secondary | ICD-10-CM | POA: Diagnosis not present

## 2014-08-01 DIAGNOSIS — B958 Unspecified staphylococcus as the cause of diseases classified elsewhere: Secondary | ICD-10-CM | POA: Diagnosis not present

## 2014-08-01 DIAGNOSIS — L0291 Cutaneous abscess, unspecified: Secondary | ICD-10-CM | POA: Diagnosis not present

## 2014-08-01 MED ORDER — SULFAMETHOXAZOLE-TMP DS 800-160 MG PO TABS: 1.0000 | ORAL_TABLET | Freq: Two times a day (BID) | ORAL | Status: DC

## 2014-08-01 NOTE — Progress Notes (Signed)
   Subjective:    Patient ID: Elizabeth Aguilar, female    DOB: 1941/01/15, 73 y.o.   MRN: [id number]  HPI Patient is here today because she has a sore in her nose. It has been present for about 2 weeks now. Patient is concerned because her son had a staph infection in his eye and she has been in close contact with him.   Patient states she has no other concerns at this time.    Review of Systems     Objective:   Physical Exam   has a small staph infection cellulitis of the left nostril. No sign of a particular abscess. I don't recommend any surgical procedure at this point. Warm compress antibiotic should get rid of it.      Assessment & Plan:  Cellulitis left nostril-in amount as prescribed warning signs discussed followup of problems

## 2014-08-05 ENCOUNTER — Telehealth: Payer: Self-pay | Admitting: Family Medicine

## 2014-08-05 MED ORDER — CLINDAMYCIN HCL 300 MG PO CAPS: 300.0000 mg | ORAL_CAPSULE | Freq: Three times a day (TID) | ORAL | Status: DC

## 2014-08-05 NOTE — Telephone Encounter (Signed)
sulfamethoxazole-trimethoprim (BACTRIM DS) 800-160 MG per tablet  Pt has been sick to her stomach since she started this med  Wants to know if there is something else she can take?   Pt see 08/01/14  [facility]

## 2014-08-05 NOTE — Telephone Encounter (Signed)
Left message on voicemail notifying patient that medication has been sent to pharmacy and to eat yogurt.

## 2014-08-05 NOTE — Telephone Encounter (Signed)
allerg to doxy, clindamycin 300 tid seven d, be sure to et yogurt with good bact in it, to helpp protect stom

## 2014-08-12 ENCOUNTER — Ambulatory Visit (INDEPENDENT_AMBULATORY_CARE_PROVIDER_SITE_OTHER): Payer: Medicare Other | Admitting: Family Medicine

## 2014-08-12 ENCOUNTER — Encounter: Payer: Self-pay | Admitting: Family Medicine

## 2014-08-12 ENCOUNTER — Encounter (HOSPITAL_COMMUNITY): Payer: Self-pay | Admitting: Emergency Medicine

## 2014-08-12 ENCOUNTER — Emergency Department (HOSPITAL_COMMUNITY): Payer: Medicare Other

## 2014-08-12 ENCOUNTER — Emergency Department (HOSPITAL_COMMUNITY)
Admission: EM | Admit: 2014-08-12 | Discharge: 2014-08-12 | Disposition: A | Discharge status: Home / Self Care | Payer: Medicare Other | Attending: Emergency Medicine | Admitting: Emergency Medicine

## 2014-08-12 VITALS — BP 122/70 | Temp 98.5°F | Ht 63.0 in | Wt 144.0 lb

## 2014-08-12 DIAGNOSIS — Z8719 Personal history of other diseases of the digestive system: Secondary | ICD-10-CM | POA: Diagnosis not present

## 2014-08-12 DIAGNOSIS — E119 Type 2 diabetes mellitus without complications: Secondary | ICD-10-CM | POA: Insufficient documentation

## 2014-08-12 DIAGNOSIS — I252 Old myocardial infarction: Secondary | ICD-10-CM | POA: Insufficient documentation

## 2014-08-12 DIAGNOSIS — Z7982 Long term (current) use of aspirin: Secondary | ICD-10-CM | POA: Diagnosis not present

## 2014-08-12 DIAGNOSIS — R079 Chest pain, unspecified: Secondary | ICD-10-CM

## 2014-08-12 DIAGNOSIS — Z7902 Long term (current) use of antithrombotics/antiplatelets: Secondary | ICD-10-CM | POA: Insufficient documentation

## 2014-08-12 DIAGNOSIS — R0789 Other chest pain: Secondary | ICD-10-CM | POA: Insufficient documentation

## 2014-08-12 DIAGNOSIS — Z792 Long term (current) use of antibiotics: Secondary | ICD-10-CM | POA: Diagnosis not present

## 2014-08-12 DIAGNOSIS — E785 Hyperlipidemia, unspecified: Secondary | ICD-10-CM | POA: Insufficient documentation

## 2014-08-12 DIAGNOSIS — Z7901 Long term (current) use of anticoagulants: Secondary | ICD-10-CM | POA: Diagnosis not present

## 2014-08-12 DIAGNOSIS — R000 Tachycardia, unspecified: Secondary | ICD-10-CM | POA: Insufficient documentation

## 2014-08-12 DIAGNOSIS — Z9861 Coronary angioplasty status: Secondary | ICD-10-CM | POA: Insufficient documentation

## 2014-08-12 DIAGNOSIS — I255 Ischemic cardiomyopathy: Secondary | ICD-10-CM | POA: Diagnosis not present

## 2014-08-12 DIAGNOSIS — R42 Dizziness and giddiness: Secondary | ICD-10-CM | POA: Diagnosis not present

## 2014-08-12 DIAGNOSIS — Z8709 Personal history of other diseases of the respiratory system: Secondary | ICD-10-CM | POA: Diagnosis not present

## 2014-08-12 LAB — COMPREHENSIVE METABOLIC PANEL
ALT: 17 U/L (ref 0–35)
AST: 14 U/L (ref 0–37)
Albumin: 3.6 g/dL (ref 3.5–5.2)
Alkaline Phosphatase: 161 U/L — ABNORMAL HIGH (ref 39–117)
Anion gap: 16 — ABNORMAL HIGH (ref 5–15)
BUN: 21 mg/dL (ref 6–23)
CALCIUM: 9.7 mg/dL (ref 8.4–10.5)
CHLORIDE: 101 meq/L (ref 96–112)
CO2: 25 mEq/L (ref 19–32)
Creatinine, Ser: 1.23 mg/dL — ABNORMAL HIGH (ref 0.50–1.10)
GFR calc Af Amer: 49 mL/min — ABNORMAL LOW (ref >90)
GFR calc non Af Amer: 42 mL/min — ABNORMAL LOW (ref >90)
GLUCOSE: 113 mg/dL — AB (ref 70–99)
Potassium: 3.6 mEq/L — ABNORMAL LOW (ref 3.7–5.3)
SODIUM: 142 meq/L (ref 137–147)
Total Bilirubin: 0.4 mg/dL (ref 0.3–1.2)
Total Protein: 7.9 g/dL (ref 6.0–8.3)

## 2014-08-12 LAB — CBC WITH DIFFERENTIAL/PLATELET
BASOS ABS: 0 10*3/uL (ref 0.0–0.1)
BASOS PCT: 0 % (ref 0–1)
Eosinophils Absolute: 0.1 10*3/uL (ref 0.0–0.7)
Eosinophils Relative: 0 % (ref 0–5)
HCT: 35.5 % — ABNORMAL LOW (ref 36.0–46.0)
HEMOGLOBIN: 11.9 g/dL — AB (ref 12.0–15.0)
Lymphocytes Relative: 13 % (ref 12–46)
Lymphs Abs: 1.6 10*3/uL (ref 0.7–4.0)
MCH: 30.6 pg (ref 26.0–34.0)
MCHC: 33.5 g/dL (ref 30.0–36.0)
MCV: 91.3 fL (ref 78.0–100.0)
MONOS PCT: 7 % (ref 3–12)
Monocytes Absolute: 0.8 10*3/uL (ref 0.1–1.0)
NEUTROS ABS: 9.5 10*3/uL — AB (ref 1.7–7.7)
Neutrophils Relative %: 80 % — ABNORMAL HIGH (ref 43–77)
Platelets: 334 10*3/uL (ref 150–400)
RBC: 3.89 MIL/uL (ref 3.87–5.11)
RDW: 13.8 % (ref 11.5–15.5)
WBC: 11.9 10*3/uL — ABNORMAL HIGH (ref 4.0–10.5)

## 2014-08-12 LAB — TROPONIN I: Troponin I: <0.3 ng/mL (ref <0.30)

## 2014-08-12 LAB — D-DIMER, QUANTITATIVE: D-Dimer, Quant: 0.58 ug/mL-FEU — ABNORMAL HIGH (ref 0.00–0.48)

## 2014-08-12 MED ORDER — HYDROCODONE-ACETAMINOPHEN 5-325 MG PO TABS: 1.0000 | ORAL_TABLET | Freq: Four times a day (QID) | ORAL | Status: DC | PRN

## 2014-08-12 MED ORDER — IOHEXOL 350 MG/ML SOLN
80.0000 mL | Freq: Once | INTRAVENOUS | Status: AC | PRN
  Administered 2014-08-12: 80 mL via INTRAVENOUS

## 2014-08-12 MED ORDER — SODIUM CHLORIDE 0.9 % IV SOLN
Freq: Once | INTRAVENOUS | Status: AC
  Administered 2014-08-12: 21:00:00 via INTRAVENOUS

## 2014-08-12 MED ORDER — SODIUM CHLORIDE 0.9 % IJ SOLN
INTRAMUSCULAR | Status: AC
  Filled 2014-08-12: qty 500

## 2014-08-12 NOTE — Discharge Instructions (Signed)
As discussed, your evaluation today has been largely reassuring.  But, it is important that you monitor your condition carefully, and do not hesitate to return to the ED if you develop new, or concerning changes in your condition. ? ?Otherwise, please follow-up with your physician for appropriate ongoing care. ? ?

## 2014-08-12 NOTE — ED Provider Notes (Signed)
CSN: [id number]     Arrival date & time 08/12/14  1543 History   First MD Initiated Contact with Patient 08/12/14 1551     Chief Complaint  Patient presents with  . Chest Pain   I discussed the patient's concerns prior to her arrival - with her physician, Dr. Wolfgang Phoenix.   HPI  Patient presents with new chest pain. Pain began approximately 12 hours prior to my initial evaluation. Pain is worse with deep inspiration. There is mild associated dizziness, but no nausea, vomiting. EKG, per EMS had tachycardia. Patient denies fever, chills,   patient has a history of acute myocardial infarction, states that she takes her antiplatelet agent regularly.  Past Medical History  Diagnosis Date  . Bronchitis   . Irritable bowel syndrome   . Polyarthralgia 1998  . Tachycardia   . Prediabetes   . Hyperlipidemia   . Type 2 diabetes mellitus   . Arthralgia   . Acute MI Feb. 2015    2 stents placed   History reviewed. No pertinent past surgical history. Family History  Problem Relation Age of Onset  . Diabetes Mother   . Diabetes Daughter   . Diabetes Son    History  Substance Use Topics  . Smoking status: Never Smoker   . Smokeless tobacco: Not on file  . Alcohol Use: No   OB History   Grav Para Term Preterm Abortions TAB SAB Ect Mult Living                 Review of Systems  Constitutional:       Per HPI, otherwise negative  HENT:       Per HPI, otherwise negative  Respiratory:       Per HPI, otherwise negative  Cardiovascular:       Per HPI, otherwise negative  Gastrointestinal: Negative for vomiting.  Endocrine:       Negative aside from HPI  Genitourinary:       Neg aside from HPI   Musculoskeletal:       Per HPI, otherwise negative  Skin: Negative.   Neurological: Negative for syncope.      Allergies  Actos; Ceftin; Celebrex; Darvocet; Doxycycline; Erythromycin; Sulfa antibiotics; Vioxx; Zithromax; and Zocor  Home Medications   Prior to Admission  medications   Medication Sig Start Date End Date Taking? Authorizing Provider  alendronate (FOSAMAX) 70 MG tablet Take 1 tablet (70 mg total) by mouth once a week. Take with a full glass of water on an empty stomach. 06/14/14  Yes Kathyrn Drown, MD  ALPRAZolam Duanne Moron) 0.5 MG tablet Take 0.5 mg by mouth 2 (two) times daily as needed for anxiety.   Yes Historical Provider, MD  aspirin EC 81 MG EC tablet Take 1 tablet (81 mg total) by mouth daily. 12/20/13  Yes Eileen Stanford, PA-C  atorvastatin (LIPITOR) 40 MG tablet Take 40 mg by mouth daily at 6 PM.   Yes Historical Provider, MD  clindamycin (CLEOCIN) 300 MG capsule Take 1 capsule (300 mg total) by mouth 3 (three) times daily. For 7 days 08/05/14  Yes Mikey Kirschner, MD  esomeprazole (NEXIUM) 40 MG capsule Take 40 mg by mouth every morning.   Yes Historical Provider, MD  fluticasone (FLONASE) 50 MCG/ACT nasal spray Place 2 sprays into both nostrils daily as needed for allergies or rhinitis.   Yes Historical Provider, MD  hydrochlorothiazide (HYDRODIURIL) 25 MG tablet Take 12.5 mg by mouth daily.   Yes Historical Provider, MD  HYDROcodone-acetaminophen (NORCO/VICODIN) 5-325 MG per tablet Take 1 tablet by mouth 4 (four) times daily as needed for moderate pain.   Yes Historical Provider, MD  ketorolac (ACULAR) 0.5 % ophthalmic solution Place 1 drop into both eyes 3 (three) times daily.    Yes Historical Provider, MD  lisinopril (PRINIVIL,ZESTRIL) 2.5 MG tablet Take 1 tablet (2.5 mg total) by mouth daily. 06/21/14  Yes Kathyrn Drown, MD  loratadine (CLARITIN) 10 MG tablet Take 10 mg by mouth daily.   Yes Historical Provider, MD  metFORMIN (GLUCOPHAGE) 500 MG tablet Take 500 mg by mouth daily.   Yes Historical Provider, MD  metoprolol succinate (TOPROL-XL) 50 MG 24 hr tablet Take 25-50 mg by mouth 2 (two) times daily. Takes one whole tablet in the morning and takes one-half tablet at bedtimeTake with or immediately following a meal.   Yes Historical  Provider, MD  Multiple Vitamin (MULTIVITAMIN) tablet Take 1 tablet by mouth daily.   Yes Historical Provider, MD  nitroGLYCERIN (NITROSTAT) 0.4 MG SL tablet Place 1 tablet (0.4 mg total) under the tongue every 5 (five) minutes x 3 doses as needed for chest pain. 12/20/13  Yes Eileen Stanford, PA-C  Ticagrelor (BRILINTA) 90 MG TABS tablet Take 1 tablet (90 mg total) by mouth 2 (two) times daily. 12/20/13  Yes Eileen Stanford, PA-C  vitamin C (ASCORBIC ACID) 500 MG tablet Take 500 mg by mouth daily.   Yes Historical Provider, MD   BP 126/70  Pulse 95  Temp(Src) 98.8 F (37.1 C) (Oral)  Resp 21  Ht 5\' 3"  (1.6 m)  Wt 144 lb (65.318 kg)  BMI 25.51 kg/m2  SpO2 98% Physical Exam  Nursing note and vitals reviewed. Constitutional: She is oriented to person, place, and time. She appears well-developed and well-nourished. No distress.  HENT:  Head: Normocephalic and atraumatic.  Eyes: Conjunctivae and EOM are normal.  Cardiovascular: Normal rate and regular rhythm.   Pulmonary/Chest: Effort normal and breath sounds normal. No stridor. No respiratory distress.  Abdominal: She exhibits no distension.  Musculoskeletal: She exhibits no edema.  Neurological: She is alert and oriented to person, place, and time. No cranial nerve deficit.  Skin: Skin is warm and dry.  Psychiatric: She has a normal mood and affect.    ED Course  Procedures (including critical care time) Labs Review Labs Reviewed  CBC WITH DIFFERENTIAL - Abnormal; Notable for the following:    WBC 11.9 (*)    Hemoglobin 11.9 (*)    HCT 35.5 (*)    Neutrophils Relative % 80 (*)    Neutro Abs 9.5 (*)    All other components within normal limits  COMPREHENSIVE METABOLIC PANEL - Abnormal; Notable for the following:    Potassium 3.6 (*)    Glucose, Bld 113 (*)    Creatinine, Ser 1.23 (*)    Alkaline Phosphatase 161 (*)    GFR calc non Af Amer 42 (*)    GFR calc Af Amer 49 (*)    Anion gap 16 (*)    All other components  within normal limits  D-DIMER, QUANTITATIVE - Abnormal; Notable for the following:    D-Dimer, Quant 0.58 (*)    All other components within normal limits  TROPONIN I  TROPONIN I    Imaging Review Dg Chest 2 View  08/12/2014   CLINICAL DATA:  Chest pain since 5 a.m.  EXAM: CHEST  2 VIEW  COMPARISON:  Chest x-ray 05/13/2011.  FINDINGS: Mediastinum hilar structures normal. The  lungs are clear of acute infiltrate. Biapical pleural thickening is stable and consistent with scarring. No pleural effusion or pneumothorax. No acute osseous abnormality.  IMPRESSION: No active cardiopulmonary disease.   Electronically Signed   By: Marcello Moores  Register   On: 08/12/2014 17:24   Ct Angio Chest Pe W/cm &/or Wo Cm  08/12/2014   CLINICAL DATA:  Left-sided chest pain.  Worse with deep inspiration.  EXAM: CT ANGIOGRAPHY CHEST WITH CONTRAST  TECHNIQUE: Multidetector CT imaging of the chest was performed using the standard protocol during bolus administration of intravenous contrast. Multiplanar CT image reconstructions and MIPs were obtained to evaluate the vascular anatomy.  CONTRAST:  72mL OMNIPAQUE IOHEXOL 350 MG/ML SOLN  COMPARISON:  None.  FINDINGS: No evidence for pulmonary embolism.  Visualized thyroid is unremarkable. No enlarged axillary, mediastinal or hilar lymphadenopathy. There is an 8 mm right paratracheal lymph node. Normal heart size. No pericardial effusion. Small hiatal hernia.  Central airways are patent. Subpleural ground-glass pulmonary opacities most suggestive of atelectasis. No large consolidative pulmonary opacity. Biapical pleural parenchymal thickening. No pleural effusion or pneumothorax.  Visualization of the upper abdomen demonstrates calcified granuloma within the liver. No aggressive or acute appearing osseous lesions.  Review of the MIP images confirms the above findings.  IMPRESSION: No evidence for pulmonary embolism.   Electronically Signed   By: Lovey Newcomer M.D.   On: 08/12/2014 21:34     EKG with sinus rhythm, rate 97, nonspecific T wave changes, abnormal  Update: The patient aware of the initial results. She continues to complain of pain, has mild tachycardia. CT scan ordered based on positive D. Dimer  Update: On review patient is in no distress, hemodynamically she remains similar, with less tachycardia, no new complaints.   I personally performed the services described in this documentation, which was scribed in my presence. The recorded information has been reviewed and is accurate.     MDM     patient with a history of multiple medical problems, including prior myocardial infarction or presents with chest pain, for greater than 12 hours. Patient's evaluation here, including serial troponins, EKG, labs, CT scan were all reassuring, with no evidence for ongoing coronary ischemia, thromboembolic processes, infection, pneumothorax, other acute pathology. She was discharged in stable condition with analgesics to follow up with primary care in 3 days   Carmin Muskrat, MD 08/12/14 2142

## 2014-08-12 NOTE — Progress Notes (Signed)
   Subjective:    Patient ID: Elizabeth Aguilar, female    DOB: 1941/04/09, 73 y.o.   MRN: [id number]  Chest Pain  This is a new problem. The current episode started today. Associated symptoms include back pain and shortness of breath. Associated symptoms comments: Neck pain, left arm pain, chills.   Pt called ambulance this am and they run EKG. Pt brought in the EKG report.  Pain in chest sharp, worse with a deep breath  No fever  No otc meds for pain  Some bk pain on left side last few ds  some rad to left arm  Pain was very bad, along with some sob  No nausea no sweaty  Stent last placed earlier this yr    Review of Systems  Respiratory: Positive for shortness of breath.   Cardiovascular: Positive for chest pain.  Musculoskeletal: Positive for back pain.       Objective:   Physical Exam  Alert no apparent distress. Vitals stable. Lungs clear. Heart regular in rhythm. Abdomen benign. Possible low-grade tenderness appear anterolateral chest.  EKG normal sinus rhythm nonspecific ST-T changes      Assessment & Plan:  Impression chest pain may well be muscular skeletal, but not enough information to say this. Discussed with patient. Plan discussed with emergency room. I spoke with ER Dr. Burnis Medin need further workup WSL

## 2014-08-12 NOTE — ED Notes (Signed)
Patient c/o L chest pain began this morning at 0500.  Described as tightness that gets worse with deep breathing.  Denies n/v.  Has some dizziness.  Called EMS who evaluated her at home and did 2 EKGs and sent them to her PCP.  She went to her PCP and had 3rd EKG done.  Dr. Wolfgang Phoenix wanted patient to come to ER.

## 2014-08-12 NOTE — ED Notes (Signed)
Pt reporting some feeling of "irritation" in chest.  Monitor showing semi-frequent PVCs.  Informed Dr. Vanita Panda of rhythm.

## 2014-08-15 ENCOUNTER — Telehealth: Payer: Self-pay | Admitting: *Deleted

## 2014-08-15 NOTE — Telephone Encounter (Signed)
f u with dr Nicki Reaper if symptoms persist

## 2014-08-15 NOTE — Telephone Encounter (Signed)
Discussed with patient. Patient verbalized understanding. 

## 2014-08-15 NOTE — Telephone Encounter (Signed)
Pt was asked to call back with an update. Seen here Friday and sent to ed. Pt states ed did not find anything wrong. She is no longer having chest pain. She is still having pain on left side of her neck. Runny nose that is clear and having chills.

## 2014-08-16 ENCOUNTER — Encounter: Payer: Self-pay | Admitting: Family Medicine

## 2014-08-16 ENCOUNTER — Ambulatory Visit (INDEPENDENT_AMBULATORY_CARE_PROVIDER_SITE_OTHER): Payer: Medicare Other | Admitting: Family Medicine

## 2014-08-16 VITALS — BP 116/76 | Temp 98.6°F | Wt 143.0 lb

## 2014-08-16 DIAGNOSIS — J301 Allergic rhinitis due to pollen: Secondary | ICD-10-CM

## 2014-08-16 DIAGNOSIS — I255 Ischemic cardiomyopathy: Secondary | ICD-10-CM

## 2014-08-16 DIAGNOSIS — I209 Angina pectoris, unspecified: Secondary | ICD-10-CM | POA: Diagnosis not present

## 2014-08-16 MED ORDER — HYDROCODONE-ACETAMINOPHEN 10-325 MG PO TABS: 1.0000 | ORAL_TABLET | ORAL | Status: DC | PRN

## 2014-08-16 NOTE — Patient Instructions (Signed)
Continue claritin Use flonase 2 sprays each nostril daily  Use hydrocodone as needed for pain, caution drowsiness  We will set you up with Dr Georgina Peer

## 2014-08-16 NOTE — Progress Notes (Signed)
   Subjective:    Patient ID: Elizabeth Aguilar, female    DOB: [birth year], 73 y.o.   MRN: [id number]  [facility] chest pain since last Friday. Seen here by dr. Richardson Landry on Friday. Was sent to [facility]. Pt states she is still having chest pain. Dull pain. Sharp pain when taking a deep breath. Left ear pain and left side of neck pain. Left shoulder and arm pain. Runny nose. Pt states she pulled 5 ticks off of her about 2 months ago. No rash. No myalgias. No severe headache. ER notes and tests were reviewed in detail PMH benign  Review of Systems With inspiration at times as a dull ache underneath the chest    Objective:   Physical Exam  Lungs clear heart regular chest wall nontender mild sinus issues are noted      Assessment & Plan:  I believe that she is having more than likely musculoskeletal pain because of her blood thinner she cannot be on anti-inflammatories I prescribed her some hydrocodone use for the next few days she states she did try nitroglycerin and it did not help  She is having substernal chest discomfort intermittently over the past couple weeks she has a history of stents she needs another appointment with her cardiologist I believe they will need a stress test. I do not feel she is having is heart attack or

## 2014-08-22 ENCOUNTER — Encounter: Payer: Self-pay | Admitting: Family Medicine

## 2014-08-22 ENCOUNTER — Other Ambulatory Visit: Payer: Self-pay | Admitting: Family Medicine

## 2014-08-22 NOTE — Telephone Encounter (Signed)
May refill this +4 additional refills 

## 2014-08-22 NOTE — Telephone Encounter (Signed)
Last seen 10.6.15

## 2014-09-01 ENCOUNTER — Ambulatory Visit: Payer: Medicare Other | Admitting: Cardiology

## 2014-09-09 ENCOUNTER — Encounter: Payer: Self-pay | Admitting: Family Medicine

## 2014-09-09 ENCOUNTER — Ambulatory Visit (INDEPENDENT_AMBULATORY_CARE_PROVIDER_SITE_OTHER): Payer: Medicare Other | Admitting: Family Medicine

## 2014-09-09 VITALS — BP 118/82 | Ht 63.0 in | Wt 143.0 lb

## 2014-09-09 DIAGNOSIS — I255 Ischemic cardiomyopathy: Secondary | ICD-10-CM | POA: Diagnosis not present

## 2014-09-09 DIAGNOSIS — R300 Dysuria: Secondary | ICD-10-CM | POA: Diagnosis not present

## 2014-09-09 DIAGNOSIS — N3000 Acute cystitis without hematuria: Secondary | ICD-10-CM

## 2014-09-09 LAB — POCT URINALYSIS DIPSTICK
Blood, UA: 1
Protein, UA: 30
pH, UA: 5

## 2014-09-09 MED ORDER — CIPROFLOXACIN HCL 500 MG PO TABS: 500.0000 mg | ORAL_TABLET | Freq: Two times a day (BID) | ORAL | Status: AC

## 2014-09-09 NOTE — Patient Instructions (Signed)

## 2014-09-09 NOTE — Progress Notes (Signed)
   Subjective:    Patient ID: Elizabeth Aguilar, female    DOB: July 30, 1941, 73 y.o.   MRN: [id number]  Dysuria  This is a new problem. The current episode started yesterday. Associated symptoms include frequency.   Patient with dysuria and urinary from C lower abdominal discomfort and flank discomfort low-grade fever but no vomiting or diarrhea   Review of Systems  Constitutional: Negative for fever and fatigue.  HENT: Negative for congestion.   Respiratory: Negative for cough.   Cardiovascular: Negative for chest pain.  Gastrointestinal: Negative for abdominal pain.  Genitourinary: Positive for dysuria and frequency.       Objective:   Physical Exam  Constitutional: She appears well-developed.  Cardiovascular: Normal rate and regular rhythm.   Pulmonary/Chest: Effort normal and breath sounds normal.  Abdominal: Soft. She exhibits no distension. There is no tenderness.  Skin: She is not diaphoretic.          Assessment & Plan:  Dysuria/urinary tract infection-antibiotics prescribed warning signs discussed culture was ordered await results

## 2014-09-10 ENCOUNTER — Other Ambulatory Visit: Payer: Self-pay | Admitting: Family Medicine

## 2014-09-12 ENCOUNTER — Telehealth: Payer: Self-pay | Admitting: Family Medicine

## 2014-09-12 LAB — URINE CULTURE

## 2014-09-12 NOTE — Telephone Encounter (Signed)
HYDROcodone-acetaminophen (NORCO) 10-325 MG per table  Pt states she never got this med when she was here on the 6th of Oct States she tried to ask for another one when she was here on the 30th of Oct  She is requesting another script to be written for her to pick up   Please advise

## 2014-09-12 NOTE — Telephone Encounter (Signed)
Patient requesting that someone call her ASAP.

## 2014-09-12 NOTE — Telephone Encounter (Signed)
Nurses, please call the patient. Asked the patient how she is currently taking the medicine in terms of frequency. And verify what she is using it for. Thank you we should be able to go ahead and give her a refill but I would like this information first

## 2014-09-13 ENCOUNTER — Other Ambulatory Visit: Payer: Self-pay

## 2014-09-13 MED ORDER — HYDROCODONE-ACETAMINOPHEN 5-325 MG PO TABS: ORAL_TABLET | ORAL | Status: DC

## 2014-09-13 NOTE — Telephone Encounter (Signed)
i believe we just did this?

## 2014-09-13 NOTE — Telephone Encounter (Signed)
Patient states she takes this medication 4 times daily. She takes it for gout in her foot.

## 2014-09-13 NOTE — Telephone Encounter (Signed)
Rx given, keep follow up ov in early Dec

## 2014-09-14 ENCOUNTER — Emergency Department (HOSPITAL_COMMUNITY)
Admission: EM | Admit: 2014-09-14 | Discharge: 2014-09-14 | Disposition: A | Discharge status: Home / Self Care | Payer: Medicare Other | Attending: Emergency Medicine | Admitting: Emergency Medicine

## 2014-09-14 ENCOUNTER — Encounter (HOSPITAL_COMMUNITY): Payer: Self-pay | Admitting: Emergency Medicine

## 2014-09-14 DIAGNOSIS — E785 Hyperlipidemia, unspecified: Secondary | ICD-10-CM | POA: Insufficient documentation

## 2014-09-14 DIAGNOSIS — Z7951 Long term (current) use of inhaled steroids: Secondary | ICD-10-CM | POA: Insufficient documentation

## 2014-09-14 DIAGNOSIS — I252 Old myocardial infarction: Secondary | ICD-10-CM | POA: Diagnosis not present

## 2014-09-14 DIAGNOSIS — Z7982 Long term (current) use of aspirin: Secondary | ICD-10-CM | POA: Insufficient documentation

## 2014-09-14 DIAGNOSIS — M79671 Pain in right foot: Secondary | ICD-10-CM | POA: Diagnosis present

## 2014-09-14 DIAGNOSIS — Z8719 Personal history of other diseases of the digestive system: Secondary | ICD-10-CM | POA: Diagnosis not present

## 2014-09-14 DIAGNOSIS — Z9861 Coronary angioplasty status: Secondary | ICD-10-CM | POA: Diagnosis not present

## 2014-09-14 DIAGNOSIS — M10071 Idiopathic gout, right ankle and foot: Secondary | ICD-10-CM | POA: Diagnosis not present

## 2014-09-14 DIAGNOSIS — Z792 Long term (current) use of antibiotics: Secondary | ICD-10-CM | POA: Diagnosis not present

## 2014-09-14 DIAGNOSIS — Z7983 Long term (current) use of bisphosphonates: Secondary | ICD-10-CM | POA: Insufficient documentation

## 2014-09-14 DIAGNOSIS — R079 Chest pain, unspecified: Secondary | ICD-10-CM | POA: Diagnosis not present

## 2014-09-14 DIAGNOSIS — Z7952 Long term (current) use of systemic steroids: Secondary | ICD-10-CM | POA: Diagnosis not present

## 2014-09-14 DIAGNOSIS — Z791 Long term (current) use of non-steroidal anti-inflammatories (NSAID): Secondary | ICD-10-CM | POA: Diagnosis not present

## 2014-09-14 DIAGNOSIS — E119 Type 2 diabetes mellitus without complications: Secondary | ICD-10-CM | POA: Insufficient documentation

## 2014-09-14 DIAGNOSIS — M109 Gout, unspecified: Secondary | ICD-10-CM | POA: Diagnosis not present

## 2014-09-14 LAB — TROPONIN I: Troponin I: <0.3 ng/mL (ref <0.30)

## 2014-09-14 MED ORDER — COLCHICINE 0.6 MG PO TABS: 0.6000 mg | ORAL_TABLET | Freq: Two times a day (BID) | ORAL | Status: DC

## 2014-09-14 MED ORDER — PREDNISONE 20 MG PO TABS: 60.0000 mg | ORAL_TABLET | Freq: Every day | ORAL | Status: DC

## 2014-09-14 NOTE — Discharge Instructions (Signed)

## 2014-09-14 NOTE — ED Provider Notes (Signed)
CSN: [id number]     Arrival date & time 09/14/14  1352 History   First MD Initiated Contact with Patient 09/14/14 1408     Chief Complaint  Patient presents with  . Foot Pain     (Consider location/radiation/quality/duration/timing/severity/associated sxs/prior Treatment) Patient is a 73 y.o. female presenting with lower extremity pain. The history is provided by the patient.  Foot Pain This is a recurrent problem. Associated symptoms include chest pain. Pertinent negatives include no abdominal pain, no headaches and no shortness of breath.  patient has pain and redness in her right foot. It is over her first MTP joint. States she had a history of gout. She is told me both she has never had in the stripe before and that this is only been in this joint. Denies trauma it began 2 days ago. No fevers. No nausea or vomiting. She also has had chest pain over the last month or 2. She's been seen in the ER in by her primary care doctor and it was considered to be chest wall pain. She states she's continued to have the episodes. Had a sharp pain in her left upper chest and comes and goes. It is not associated with exertion. She's had a previous coronary stent. No cough and no trouble breathing. She states she could not be started on NSAIDs because of her stent.  Past Medical History  Diagnosis Date  . Bronchitis   . Irritable bowel syndrome   . Polyarthralgia 1998  . Tachycardia   . Prediabetes   . Hyperlipidemia   . Type 2 diabetes mellitus   . Arthralgia   . Acute MI Feb. 2015    2 stents placed   Past Surgical History  Procedure Laterality Date  . Coronary angioplasty with stent placement     Family History  Problem Relation Age of Onset  . Diabetes Mother   . Diabetes Daughter   . Diabetes Son    History  Substance Use Topics  . Smoking status: Never Smoker   . Smokeless tobacco: Not on file  . Alcohol Use: No   OB History    No data available     Review of Systems   Constitutional: Negative for activity change and appetite change.  Eyes: Negative for pain.  Respiratory: Negative for chest tightness and shortness of breath.   Cardiovascular: Positive for chest pain. Negative for leg swelling.  Gastrointestinal: Negative for nausea, vomiting, abdominal pain and diarrhea.  Genitourinary: Negative for flank pain.  Musculoskeletal: Positive for gait problem. Negative for back pain and neck stiffness.  Skin: Positive for color change. Negative for rash.  Neurological: Negative for weakness, numbness and headaches.  Psychiatric/Behavioral: Negative for behavioral problems.      Allergies  Actos; Ceftin; Celebrex; Darvocet; Doxycycline; Erythromycin; Sulfa antibiotics; Vioxx; Zithromax; and Zocor  Home Medications   Prior to Admission medications   Medication Sig Start Date End Date Taking? Authorizing Provider  alendronate (FOSAMAX) 70 MG tablet Take 1 tablet (70 mg total) by mouth once a week. Take with a full glass of water on an empty stomach. Patient taking differently: Take 70 mg by mouth once a week. Take with a full glass of water on an empty stomach.(wednesday) 06/14/14  Yes Kathyrn Drown, MD  ALPRAZolam Duanne Moron) 0.5 MG tablet TAKE ONE TABLET TWICE DAILY AS NEEDED 08/23/14  Yes Kathyrn Drown, MD  aspirin EC 81 MG EC tablet Take 1 tablet (81 mg total) by mouth daily. 12/20/13  Yes Curt Bears  R Thompson, PA-C  atorvastatin (LIPITOR) 40 MG tablet Take 40 mg by mouth daily at 6 PM.   Yes Historical Provider, MD  ciprofloxacin (CIPRO) 500 MG tablet Take 1 tablet (500 mg total) by mouth 2 (two) times daily. 09/09/14 09/15/14 Yes Kathyrn Drown, MD  esomeprazole (NEXIUM) 40 MG capsule Take 40 mg by mouth every morning.   Yes Historical Provider, MD  fluticasone (FLONASE) 50 MCG/ACT nasal spray Place 2 sprays into both nostrils daily as needed for allergies or rhinitis.   Yes Historical Provider, MD  hydrochlorothiazide (HYDRODIURIL) 25 MG tablet Take 12.5 mg  by mouth daily.   Yes Historical Provider, MD  HYDROcodone-acetaminophen (NORCO/VICODIN) 5-325 MG per tablet Take 1 tablet po 4 times daily prn Patient taking differently: Take 1 tablet by mouth 4 (four) times daily as needed (pain). Take 1 tablet po 4 times daily prn 09/13/14  Yes Kathyrn Drown, MD  ketorolac (ACULAR) 0.5 % ophthalmic solution Place 1 drop into both eyes 3 (three) times daily.    Yes Historical Provider, MD  lisinopril (PRINIVIL,ZESTRIL) 2.5 MG tablet Take 1 tablet (2.5 mg total) by mouth daily. 06/21/14  Yes Kathyrn Drown, MD  loratadine (CLARITIN) 10 MG tablet Take 10 mg by mouth daily.   Yes Historical Provider, MD  metFORMIN (GLUCOPHAGE) 500 MG tablet Take 500 mg by mouth daily.   Yes Historical Provider, MD  metoprolol succinate (TOPROL-XL) 50 MG 24 hr tablet Take 25-50 mg by mouth 2 (two) times daily. Takes one whole tablet in the morning and takes one-half tablet at bedtimeTake with or immediately following a meal.   Yes Historical Provider, MD  Multiple Vitamin (MULTIVITAMIN) tablet Take 1 tablet by mouth daily.   Yes Historical Provider, MD  nitroGLYCERIN (NITROSTAT) 0.4 MG SL tablet Place 1 tablet (0.4 mg total) under the tongue every 5 (five) minutes x 3 doses as needed for chest pain. 12/20/13  Yes Eileen Stanford, PA-C  Ticagrelor (BRILINTA) 90 MG TABS tablet Take 1 tablet (90 mg total) by mouth 2 (two) times daily. 12/20/13  Yes Eileen Stanford, PA-C  vitamin C (ASCORBIC ACID) 500 MG tablet Take 500 mg by mouth daily.   Yes Historical Provider, MD  colchicine 0.6 MG tablet Take 1 tablet (0.6 mg total) by mouth 2 (two) times daily. 09/14/14   Jasper Riling. Cherrish Vitali, MD  predniSONE (DELTASONE) 20 MG tablet Take 3 tablets (60 mg total) by mouth daily. 09/14/14   Jasper Riling. Adlee Paar, MD   BP 111/43 mmHg  Pulse 98  Temp(Src) 97.5 F (36.4 C) (Oral)  Resp 17  Ht 5\' 3"  (1.6 m)  Wt 142 lb (64.411 kg)  BMI 25.16 kg/m2  SpO2 100% Physical Exam  Constitutional: She appears  well-developed and well-nourished.  HENT:  Head: Normocephalic.  Cardiovascular: Normal rate and regular rhythm.   Pulmonary/Chest: Effort normal and breath sounds normal.  Abdominal: Soft. There is no tenderness.  Musculoskeletal: She exhibits tenderness.  Some redness and tenderness of left first MTP joint. Neurovascularly intact over her toes. Pulse intact over foot.  Skin: Skin is warm.    ED Course  Procedures (including critical care time) Labs Review Labs Reviewed  TROPONIN I    Imaging Review No results found.   EKG Interpretation   Date/Time:  Wednesday September 14 2014 14:25:13 EST Ventricular Rate:  96 PR Interval:  125 QRS Duration: 80 QT Interval:  360 QTC Calculation: 455 R Axis:   95 Text Interpretation:  Sinus rhythm Right  axis deviation Abnormal lateral Q  waves No significant change since last tracing Confirmed by Alvino Chapel  MD,  Ovid Curd [phone]) on 09/14/2014 3:20:38 PM      MDM   Final diagnoses:  Acute gout of right foot, unspecified cause  Chest pain, unspecified chest pain type    Patient with foot pain, likely gout will treat with steroids since patient cannot take NSAIDs. History of gout in the past patient has had chest pain which has been previously worked up but does have cardiac history. Troponin negative and EKG reassuring and will be discharged home    Wanda. Alvino Chapel, MD 09/14/14 1554

## 2014-09-14 NOTE — ED Notes (Signed)
Pt reports right foot swelling and RLE pain since Sunday. Pt denies any known injury. Redness and warmth noted to RLE. nad noted. Distal pulses intact.

## 2014-09-19 ENCOUNTER — Telehealth: Payer: Self-pay | Admitting: Family Medicine

## 2014-09-19 MED ORDER — ONDANSETRON 8 MG PO TBDP: 8.0000 mg | ORAL_TABLET | Freq: Three times a day (TID) | ORAL | Status: DC | PRN

## 2014-09-19 MED ORDER — DIPHENOXYLATE-ATROPINE 2.5-0.025 MG PO TABS: 1.0000 | ORAL_TABLET | Freq: Three times a day (TID) | ORAL | Status: DC | PRN

## 2014-09-19 NOTE — Telephone Encounter (Signed)
pts family looking to see if this is going to be done for her today

## 2014-09-19 NOTE — Telephone Encounter (Signed)
Patient has been having nausea and diarrhea and has tried OTC meds.  Can we call something in for these issues? She said these symptoms started after she started her antibiotics.  [facility]

## 2014-09-19 NOTE — Telephone Encounter (Signed)
Spoke with patient. She denies fever, bloody or mucousy stools. Per Dr. Nicki Reaper, I sent in Zofran and Lomotil for diarrhea and nausea. Pt verbalized understanding.

## 2014-10-04 ENCOUNTER — Encounter: Payer: Self-pay | Admitting: Family Medicine

## 2014-10-04 ENCOUNTER — Ambulatory Visit (INDEPENDENT_AMBULATORY_CARE_PROVIDER_SITE_OTHER): Payer: Medicare Other | Admitting: Family Medicine

## 2014-10-04 VITALS — Ht 63.0 in | Wt 143.0 lb

## 2014-10-04 DIAGNOSIS — Z23 Encounter for immunization: Secondary | ICD-10-CM | POA: Diagnosis not present

## 2014-10-04 DIAGNOSIS — I255 Ischemic cardiomyopathy: Secondary | ICD-10-CM | POA: Diagnosis not present

## 2014-10-04 DIAGNOSIS — G8929 Other chronic pain: Secondary | ICD-10-CM | POA: Diagnosis not present

## 2014-10-04 DIAGNOSIS — M549 Dorsalgia, unspecified: Secondary | ICD-10-CM | POA: Diagnosis not present

## 2014-10-04 DIAGNOSIS — E119 Type 2 diabetes mellitus without complications: Secondary | ICD-10-CM

## 2014-10-04 DIAGNOSIS — E785 Hyperlipidemia, unspecified: Secondary | ICD-10-CM

## 2014-10-04 LAB — POCT GLYCOSYLATED HEMOGLOBIN (HGB A1C): Hemoglobin A1C: 6.6

## 2014-10-04 MED ORDER — HYDROCODONE-ACETAMINOPHEN 5-325 MG PO TABS: ORAL_TABLET | ORAL | Status: DC

## 2014-10-04 MED ORDER — METFORMIN HCL 500 MG PO TABS: 500.0000 mg | ORAL_TABLET | Freq: Two times a day (BID) | ORAL | Status: DC

## 2014-10-04 MED ORDER — ONDANSETRON 8 MG PO TBDP: 8.0000 mg | ORAL_TABLET | Freq: Three times a day (TID) | ORAL | Status: DC | PRN

## 2014-10-04 MED ORDER — ESOMEPRAZOLE MAGNESIUM 40 MG PO CPDR: 40.0000 mg | DELAYED_RELEASE_CAPSULE | Freq: Every morning | ORAL | Status: DC

## 2014-10-04 MED ORDER — HYDROCODONE-ACETAMINOPHEN 5-325 MG PO TABS
ORAL_TABLET | ORAL | Status: DC
Start: 2014-10-04 — End: 2014-12-12

## 2014-10-04 MED ORDER — ATORVASTATIN CALCIUM 40 MG PO TABS: 40.0000 mg | ORAL_TABLET | Freq: Every day | ORAL | Status: DC

## 2014-10-04 MED ORDER — LORATADINE 10 MG PO TABS: 10.0000 mg | ORAL_TABLET | Freq: Every day | ORAL | Status: DC

## 2014-10-04 MED ORDER — HYDROCHLOROTHIAZIDE 25 MG PO TABS: 12.5000 mg | ORAL_TABLET | Freq: Every day | ORAL | Status: DC

## 2014-10-04 NOTE — Progress Notes (Signed)
   Subjective:    Patient ID: Elizabeth Aguilar, female    DOB: 1941-07-10, 73 y.o.   MRN: [id number]  Diabetes She presents for her initial diabetic visit. She has type 2 diabetes mellitus. Risk factors for coronary artery disease include diabetes mellitus, dyslipidemia and hypertension. Current diabetic treatment includes oral agent (monotherapy). She is compliant with treatment all of the time. She is following a diabetic diet. She has not had a previous visit with a dietitian. She does not see a podiatrist.Eye exam is not current.   Needs to get a pair of diabetic shoes. Still having pain in legs. She has a long discussion today regarding multiple problems Nasal sores intermittently over the past couple days She relates slight nausea intermittent vomiting no abdominal pain no weight loss She states her diabetes been under decent control taken her medicines as directed She relates intermittent neck pain over the past day hurts to rotate to the right She relates chronic pain and discomfort in her back and joints needs refills on her medicine She also relates intermittent right leg pain that's been present over the past couple days she denies any swelling of the calf.  Review of Systems     Objective:   Physical Exam Next subjective discomfort on the right side neck is supple fairly decent range of motion difficulty rotating left and right strength in arms and legs is good lungs are clear no crackles heart is regular right knee mild arthritis is noted left knee normal Nontender Not Swollen No Sign of Any Type of DVT. No Sign of Cellulitis.  Diabetic Foot Exam Was Completed near or Sensory Is Good Has Bunions on Both Feet.       Assessment & Plan:  Patient would benefit from diabetic shoes  Neck pain I feel this is musculoskeletal no x-rays necessary at progressive troubles worse follow-up  Right leg pain for 2 days but did not have any pain yesterday if it reoccurs to  follow-up  Chronic pain she needed her prescriptions refilled.  Patient relates intermittent nausea she uses Zofran for this she states it helps I told her if she starts losing weight having abdominal pain vomiting or other problems to follow-up  Patient reports intermittent nasal sores no infection is seen on today's exam do not use Flonase for now use saline nasal spray  Hyperlipidemia take medicine check lipid profile  35-40 minutes spent with this patient today multiple complex issues discussed in sequence

## 2014-10-10 ENCOUNTER — Encounter (HOSPITAL_COMMUNITY): Payer: Self-pay

## 2014-10-10 ENCOUNTER — Inpatient Hospital Stay (HOSPITAL_COMMUNITY)
Admission: EM | Admit: 2014-10-10 | Discharge: 2014-10-14 | DRG: 314 | Disposition: A | Discharge status: Home / Self Care | Payer: Medicare Other | Attending: Cardiovascular Disease | Admitting: Cardiovascular Disease

## 2014-10-10 ENCOUNTER — Emergency Department (HOSPITAL_COMMUNITY): Payer: Medicare Other

## 2014-10-10 DIAGNOSIS — I10 Essential (primary) hypertension: Secondary | ICD-10-CM | POA: Diagnosis present

## 2014-10-10 DIAGNOSIS — Z888 Allergy status to other drugs, medicaments and biological substances status: Secondary | ICD-10-CM

## 2014-10-10 DIAGNOSIS — Z833 Family history of diabetes mellitus: Secondary | ICD-10-CM | POA: Diagnosis not present

## 2014-10-10 DIAGNOSIS — I313 Pericardial effusion (noninflammatory): Secondary | ICD-10-CM | POA: Diagnosis present

## 2014-10-10 DIAGNOSIS — I517 Cardiomegaly: Secondary | ICD-10-CM | POA: Diagnosis present

## 2014-10-10 DIAGNOSIS — Z7902 Long term (current) use of antithrombotics/antiplatelets: Secondary | ICD-10-CM | POA: Diagnosis not present

## 2014-10-10 DIAGNOSIS — K449 Diaphragmatic hernia without obstruction or gangrene: Secondary | ICD-10-CM | POA: Diagnosis present

## 2014-10-10 DIAGNOSIS — Z955 Presence of coronary angioplasty implant and graft: Secondary | ICD-10-CM

## 2014-10-10 DIAGNOSIS — I252 Old myocardial infarction: Secondary | ICD-10-CM

## 2014-10-10 DIAGNOSIS — I5042 Chronic combined systolic (congestive) and diastolic (congestive) heart failure: Secondary | ICD-10-CM | POA: Diagnosis present

## 2014-10-10 DIAGNOSIS — E876 Hypokalemia: Secondary | ICD-10-CM | POA: Diagnosis present

## 2014-10-10 DIAGNOSIS — I2510 Atherosclerotic heart disease of native coronary artery without angina pectoris: Secondary | ICD-10-CM

## 2014-10-10 DIAGNOSIS — I5023 Acute on chronic systolic (congestive) heart failure: Secondary | ICD-10-CM | POA: Diagnosis present

## 2014-10-10 DIAGNOSIS — R0602 Shortness of breath: Secondary | ICD-10-CM

## 2014-10-10 DIAGNOSIS — E785 Hyperlipidemia, unspecified: Secondary | ICD-10-CM | POA: Diagnosis present

## 2014-10-10 DIAGNOSIS — Z9861 Coronary angioplasty status: Secondary | ICD-10-CM | POA: Diagnosis not present

## 2014-10-10 DIAGNOSIS — D649 Anemia, unspecified: Secondary | ICD-10-CM | POA: Diagnosis present

## 2014-10-10 DIAGNOSIS — Z7982 Long term (current) use of aspirin: Secondary | ICD-10-CM | POA: Diagnosis not present

## 2014-10-10 DIAGNOSIS — M109 Gout, unspecified: Secondary | ICD-10-CM | POA: Diagnosis present

## 2014-10-10 DIAGNOSIS — Z881 Allergy status to other antibiotic agents status: Secondary | ICD-10-CM

## 2014-10-10 DIAGNOSIS — N182 Chronic kidney disease, stage 2 (mild): Secondary | ICD-10-CM

## 2014-10-10 DIAGNOSIS — I319 Disease of pericardium, unspecified: Secondary | ICD-10-CM | POA: Diagnosis present

## 2014-10-10 DIAGNOSIS — Z882 Allergy status to sulfonamides status: Secondary | ICD-10-CM

## 2014-10-10 DIAGNOSIS — J9811 Atelectasis: Secondary | ICD-10-CM | POA: Diagnosis not present

## 2014-10-10 DIAGNOSIS — R791 Abnormal coagulation profile: Secondary | ICD-10-CM | POA: Diagnosis not present

## 2014-10-10 DIAGNOSIS — R404 Transient alteration of awareness: Secondary | ICD-10-CM | POA: Diagnosis not present

## 2014-10-10 DIAGNOSIS — R531 Weakness: Secondary | ICD-10-CM | POA: Diagnosis not present

## 2014-10-10 DIAGNOSIS — R0789 Other chest pain: Secondary | ICD-10-CM | POA: Diagnosis not present

## 2014-10-10 DIAGNOSIS — T504X5A Adverse effect of drugs affecting uric acid metabolism, initial encounter: Secondary | ICD-10-CM | POA: Diagnosis not present

## 2014-10-10 DIAGNOSIS — I255 Ischemic cardiomyopathy: Secondary | ICD-10-CM | POA: Diagnosis present

## 2014-10-10 DIAGNOSIS — J90 Pleural effusion, not elsewhere classified: Secondary | ICD-10-CM | POA: Diagnosis not present

## 2014-10-10 DIAGNOSIS — R079 Chest pain, unspecified: Secondary | ICD-10-CM | POA: Diagnosis not present

## 2014-10-10 DIAGNOSIS — R7989 Other specified abnormal findings of blood chemistry: Secondary | ICD-10-CM

## 2014-10-10 DIAGNOSIS — R000 Tachycardia, unspecified: Secondary | ICD-10-CM | POA: Diagnosis present

## 2014-10-10 DIAGNOSIS — K580 Irritable bowel syndrome with diarrhea: Secondary | ICD-10-CM | POA: Diagnosis present

## 2014-10-10 DIAGNOSIS — E119 Type 2 diabetes mellitus without complications: Secondary | ICD-10-CM | POA: Diagnosis present

## 2014-10-10 DIAGNOSIS — Y9289 Other specified places as the place of occurrence of the external cause: Secondary | ICD-10-CM | POA: Diagnosis not present

## 2014-10-10 DIAGNOSIS — I5031 Acute diastolic (congestive) heart failure: Secondary | ICD-10-CM | POA: Diagnosis not present

## 2014-10-10 DIAGNOSIS — E1122 Type 2 diabetes mellitus with diabetic chronic kidney disease: Secondary | ICD-10-CM

## 2014-10-10 DIAGNOSIS — I2102 ST elevation (STEMI) myocardial infarction involving left anterior descending coronary artery: Secondary | ICD-10-CM | POA: Diagnosis present

## 2014-10-10 HISTORY — DX: Atherosclerotic heart disease of native coronary artery without angina pectoris: I25.10

## 2014-10-10 HISTORY — DX: Chest pain, unspecified: R07.9

## 2014-10-10 HISTORY — DX: Disease of pericardium, unspecified: I31.9

## 2014-10-10 LAB — COMPREHENSIVE METABOLIC PANEL
ALK PHOS: 114 U/L (ref 39–117)
ALT: 11 U/L (ref 0–35)
AST: 11 U/L (ref 0–37)
Albumin: 2.4 g/dL — ABNORMAL LOW (ref 3.5–5.2)
Anion gap: 14 (ref 5–15)
BUN: 18 mg/dL (ref 6–23)
CO2: 25 meq/L (ref 19–32)
Calcium: 8.4 mg/dL (ref 8.4–10.5)
Chloride: 102 mEq/L (ref 96–112)
Creatinine, Ser: 1.1 mg/dL (ref 0.50–1.10)
GFR calc Af Amer: 56 mL/min — ABNORMAL LOW (ref >90)
GFR, EST NON AFRICAN AMERICAN: 48 mL/min — AB (ref >90)
GLUCOSE: 153 mg/dL — AB (ref 70–99)
Potassium: 3.7 mEq/L (ref 3.7–5.3)
SODIUM: 141 meq/L (ref 137–147)
TOTAL PROTEIN: 6 g/dL (ref 6.0–8.3)
Total Bilirubin: 0.6 mg/dL (ref 0.3–1.2)

## 2014-10-10 LAB — CBC WITH DIFFERENTIAL/PLATELET
Basophils Absolute: 0 10*3/uL (ref 0.0–0.1)
Basophils Relative: 0 % (ref 0–1)
EOS PCT: 0 % (ref 0–5)
Eosinophils Absolute: 0 10*3/uL (ref 0.0–0.7)
HCT: 31.1 % — ABNORMAL LOW (ref 36.0–46.0)
HEMOGLOBIN: 10.1 g/dL — AB (ref 12.0–15.0)
LYMPHS ABS: 1.1 10*3/uL (ref 0.7–4.0)
Lymphocytes Relative: 11 % — ABNORMAL LOW (ref 12–46)
MCH: 28.5 pg (ref 26.0–34.0)
MCHC: 32.5 g/dL (ref 30.0–36.0)
MCV: 87.6 fL (ref 78.0–100.0)
MONO ABS: 1 10*3/uL (ref 0.1–1.0)
MONOS PCT: 10 % (ref 3–12)
Neutro Abs: 7.9 10*3/uL — ABNORMAL HIGH (ref 1.7–7.7)
Neutrophils Relative %: 79 % — ABNORMAL HIGH (ref 43–77)
Platelets: 258 10*3/uL (ref 150–400)
RBC: 3.55 MIL/uL — AB (ref 3.87–5.11)
RDW: 14.8 % (ref 11.5–15.5)
WBC: 10 10*3/uL (ref 4.0–10.5)

## 2014-10-10 LAB — GLUCOSE, CAPILLARY: GLUCOSE-CAPILLARY: 108 mg/dL — AB (ref 70–99)

## 2014-10-10 LAB — PRO B NATRIURETIC PEPTIDE: Pro B Natriuretic peptide (BNP): 1084 pg/mL — ABNORMAL HIGH (ref 0–125)

## 2014-10-10 LAB — D-DIMER, QUANTITATIVE: D-Dimer, Quant: 2.34 ug/mL-FEU — ABNORMAL HIGH (ref 0.00–0.48)

## 2014-10-10 LAB — SEDIMENTATION RATE: Sed Rate: 83 mm/hr — ABNORMAL HIGH (ref 0–22)

## 2014-10-10 LAB — TROPONIN I: Troponin I: <0.3 ng/mL (ref <0.30)

## 2014-10-10 MED ORDER — METOPROLOL SUCCINATE ER 25 MG PO TB24
25.0000 mg | ORAL_TABLET | Freq: Every day | ORAL | Status: DC
  Administered 2014-10-10 – 2014-10-13 (×4): 25 mg via ORAL
  Filled 2014-10-10 (×4): qty 1

## 2014-10-10 MED ORDER — IOHEXOL 350 MG/ML SOLN
100.0000 mL | Freq: Once | INTRAVENOUS | Status: AC | PRN
  Administered 2014-10-10: 100 mL via INTRAVENOUS

## 2014-10-10 MED ORDER — HYDROCODONE-ACETAMINOPHEN 5-325 MG PO TABS
1.0000 | ORAL_TABLET | Freq: Four times a day (QID) | ORAL | Status: DC | PRN
  Administered 2014-10-10 – 2014-10-11 (×4): 1 via ORAL
  Filled 2014-10-10 (×5): qty 1

## 2014-10-10 MED ORDER — NITROGLYCERIN 0.4 MG SL SUBL: 0.4000 mg | SUBLINGUAL_TABLET | SUBLINGUAL | Status: DC | PRN

## 2014-10-10 MED ORDER — ACETAMINOPHEN 325 MG PO TABS
650.0000 mg | ORAL_TABLET | Freq: Once | ORAL | Status: AC
  Administered 2014-10-10: 650 mg via ORAL
  Filled 2014-10-10: qty 2

## 2014-10-10 MED ORDER — LISINOPRIL 2.5 MG PO TABS
2.5000 mg | ORAL_TABLET | Freq: Every day | ORAL | Status: DC
Start: 2014-10-10 — End: 2014-10-14
  Administered 2014-10-10 – 2014-10-14 (×5): 2.5 mg via ORAL
  Filled 2014-10-10 (×5): qty 1

## 2014-10-10 MED ORDER — COLCHICINE 0.6 MG PO TABS
0.6000 mg | ORAL_TABLET | Freq: Two times a day (BID) | ORAL | Status: DC
  Administered 2014-10-10 – 2014-10-12 (×5): 0.6 mg via ORAL
  Filled 2014-10-10 (×5): qty 1

## 2014-10-10 MED ORDER — PANTOPRAZOLE SODIUM 40 MG PO TBEC
80.0000 mg | DELAYED_RELEASE_TABLET | Freq: Every day | ORAL | Status: DC
Start: 2014-10-10 — End: 2014-10-14
  Administered 2014-10-10 – 2014-10-14 (×5): 80 mg via ORAL
  Filled 2014-10-10 (×6): qty 2

## 2014-10-10 MED ORDER — METOPROLOL SUCCINATE ER 25 MG PO TB24: 25.0000 mg | ORAL_TABLET | Freq: Two times a day (BID) | ORAL | Status: DC

## 2014-10-10 MED ORDER — METOPROLOL SUCCINATE ER 50 MG PO TB24
50.0000 mg | ORAL_TABLET | Freq: Every day | ORAL | Status: DC
  Administered 2014-10-11 – 2014-10-14 (×4): 50 mg via ORAL
  Filled 2014-10-10 (×4): qty 1

## 2014-10-10 MED ORDER — ASPIRIN EC 81 MG PO TBEC
81.0000 mg | DELAYED_RELEASE_TABLET | Freq: Every day | ORAL | Status: DC
  Administered 2014-10-10 – 2014-10-14 (×5): 81 mg via ORAL
  Filled 2014-10-10 (×5): qty 1

## 2014-10-10 MED ORDER — TICAGRELOR 90 MG PO TABS
90.0000 mg | ORAL_TABLET | Freq: Two times a day (BID) | ORAL | Status: DC
  Administered 2014-10-10 – 2014-10-14 (×8): 90 mg via ORAL
  Filled 2014-10-10 (×8): qty 1

## 2014-10-10 MED ORDER — FUROSEMIDE 10 MG/ML IJ SOLN
20.0000 mg | Freq: Once | INTRAMUSCULAR | Status: AC
  Administered 2014-10-10: 20 mg via INTRAVENOUS
  Filled 2014-10-10: qty 2

## 2014-10-10 MED ORDER — HYDROCHLOROTHIAZIDE 25 MG PO TABS
12.5000 mg | ORAL_TABLET | Freq: Every day | ORAL | Status: DC
  Administered 2014-10-10 – 2014-10-14 (×5): 12.5 mg via ORAL
  Filled 2014-10-10 (×5): qty 1

## 2014-10-10 MED ORDER — ALPRAZOLAM 0.5 MG PO TABS
0.5000 mg | ORAL_TABLET | Freq: Two times a day (BID) | ORAL | Status: DC | PRN
  Administered 2014-10-12 – 2014-10-13 (×3): 0.5 mg via ORAL
  Filled 2014-10-10 (×3): qty 1

## 2014-10-10 MED ORDER — ATORVASTATIN CALCIUM 40 MG PO TABS
40.0000 mg | ORAL_TABLET | Freq: Every day | ORAL | Status: DC
  Administered 2014-10-10 – 2014-10-13 (×4): 40 mg via ORAL
  Filled 2014-10-10 (×4): qty 1

## 2014-10-10 NOTE — ED Notes (Signed)
Patient transported to X-ray 

## 2014-10-10 NOTE — ED Notes (Signed)
Cardiology PA at bedside. 

## 2014-10-10 NOTE — ED Notes (Signed)
11/26- Chills, headache, myalgia's, weakness, chest tightness, nausea. Pt received Influenza vaccine and Pneumonia vaccine 10/05/14.

## 2014-10-10 NOTE — ED Notes (Signed)
Pt to CT at this time.

## 2014-10-10 NOTE — Consult Note (Signed)
Reason for Consult: Dyspnea Referring Physician: [facility] ED Primary Cardiologist: Dr. Claiborne Billings  HPI: Elizabeth Aguilar is a 73 y.o. female, followed by Dr. Claiborne Billings, with a history of CAD, HLD and diabetes who suffered an ST segment elevation myocardial infarction in 12/16/2013 and underwent urgent catheterization and stenting of her LAD and staged RCA stenting. EF at that time was 25%. She was sent home with a LifeVest and placed on appropriate medical therapy. Repeat 2D echo on 03/03/14 demonstrated improved systolic function with EF at 40%. Her LifeVest was discontinued. Her last OV with Dr. Claiborne Billings was 07/19/14 and she was felt to be stable from a cardiac standpoint. Dr. Claiborne Billings advised that she follow-up in 6 months.  She presented to the [facility] ED today with complaints of chest pain and dyspnea. D-dimer in ED was elevated at 2.34 but CT of chest is negative for PE. The CT scan did show new small bilateral pleural effusions and small pericardial effusion, suggestive of fluid overload. CXR also demonstrates small bilateral pleural effusions and minimal basilar atelectasis as well as cardiomegaly without edema. BNP is elevated at 1084. SCr is 1.10. CBC reveals mild anemia with Hgb at 10.1 (down from 12.7 3 months ago adn 11.9 1 month ago). Troponin is negative x 1. Her EKG demonstrates sinus tachycardia with heart rate at 109 bpm. She is noted to have low voltage QRS complexes in the precordial leads and nonspecific T-wave abnormalities in the anterior lateral leads. She appears to have slight PR depressions as well.   The patient tells me that she has had symptoms of sharp pleuritic chest pain and mild occasional dyspnea for the past 6 weeks. She cannot recall the exact symptoms she experienced at the time of her MI in February of this year, however she feels that her recent symptoms are different. She has been seen multiple times at the [facility] Emergency Department and workup has been unremarkable. She does note that she  also recently had a gout flare several weeks ago which was treated with prednisone. During that time, her chest pain resolved however reemerged after discontinuation of prednisone. In addition to  pleuritic chest pain, she also notes chest pain with palpation of the chest wall. It is not worsened by positional changes or exertion. She denies orthopnea, PND or lower extremity edema. No recent viral illnesses. She reports full medication compliance, including daily compliance with dual antiplatelet therapy (aspirin plus Brilinta).  Past Medical History  Diagnosis Date  . Bronchitis   . Irritable bowel syndrome   . Polyarthralgia 1998  . Tachycardia   . Prediabetes   . Hyperlipidemia   . Type 2 diabetes mellitus   . Arthralgia   . Acute MI Feb. 2015    2 stents placed    Past Surgical History  Procedure Laterality Date  . Coronary angioplasty with stent placement      Family History  Problem Relation Age of Onset  . Diabetes Mother   . Diabetes Daughter   . Diabetes Son     Social History:  reports that she has never smoked. She does not have any smokeless tobacco history on file. She reports that she does not drink alcohol or use illicit drugs.  Allergies:  Allergies  Allergen Reactions  . Actos [Pioglitazone] Swelling  . Ceftin [Cefuroxime Axetil] Nausea Only  . Celebrex [Celecoxib]     Sickness  . Darvocet [Propoxyphene N-Acetaminophen] Nausea Only  . Doxycycline Nausea And Vomiting and Other (See Comments)  Bones ache  . Erythromycin Nausea And Vomiting  . Sulfa Antibiotics Nausea And Vomiting and Other (See Comments)    Bones ache   . Vioxx [Rofecoxib]     Sickness   . Zithromax [Azithromycin] Nausea Only  . Zocor [Simvastatin]     Side effects    Medications:  Prior to Admission medications   Medication Sig Start Date End Date Taking? Authorizing Provider  alendronate (FOSAMAX) 70 MG tablet Take 1 tablet (70 mg total) by mouth once a week. Take with a full  glass of water on an empty stomach. Patient taking differently: Take 70 mg by mouth once a week. Take with a full glass of water on an empty stomach.(wednesday) 06/14/14  Yes Kathyrn Drown, MD  ALPRAZolam Duanne Moron) 0.5 MG tablet TAKE ONE TABLET TWICE DAILY AS NEEDED 08/23/14  Yes Kathyrn Drown, MD  aspirin EC 81 MG EC tablet Take 1 tablet (81 mg total) by mouth daily. 12/20/13  Yes Eileen Stanford, PA-C  atorvastatin (LIPITOR) 40 MG tablet Take 1 tablet (40 mg total) by mouth daily at 6 PM. 10/04/14  Yes Kathyrn Drown, MD  diphenoxylate-atropine (LOMOTIL) 2.5-0.025 MG per tablet Take 1 tablet by mouth 3 (three) times daily as needed for diarrhea or loose stools. 09/19/14  Yes Kathyrn Drown, MD  esomeprazole (NEXIUM) 40 MG capsule Take 1 capsule (40 mg total) by mouth every morning. 10/04/14  Yes Kathyrn Drown, MD  hydrochlorothiazide (HYDRODIURIL) 25 MG tablet Take 0.5 tablets (12.5 mg total) by mouth daily. 10/04/14  Yes Kathyrn Drown, MD  HYDROcodone-acetaminophen (NORCO/VICODIN) 5-325 MG per tablet Take 1 tablet po 4 times daily prn 10/04/14  Yes Kathyrn Drown, MD  ketorolac (ACULAR) 0.5 % ophthalmic solution Place 1 drop into both eyes 3 (three) times daily.    Yes Historical Provider, MD  lisinopril (PRINIVIL,ZESTRIL) 2.5 MG tablet Take 1 tablet (2.5 mg total) by mouth daily. 06/21/14  Yes Kathyrn Drown, MD  loratadine (CLARITIN) 10 MG tablet Take 1 tablet (10 mg total) by mouth daily. 10/04/14  Yes Kathyrn Drown, MD  metFORMIN (GLUCOPHAGE) 500 MG tablet Take 1 tablet (500 mg total) by mouth 2 (two) times daily with a meal. 10/04/14  Yes Kathyrn Drown, MD  metoprolol succinate (TOPROL-XL) 50 MG 24 hr tablet Take 25-50 mg by mouth 2 (two) times daily. Takes one whole tablet in the morning and takes one-half tablet at bedtimeTake with or immediately following a meal.   Yes Historical Provider, MD  Multiple Vitamin (MULTIVITAMIN) tablet Take 1 tablet by mouth daily.   Yes Historical Provider, MD   nitroGLYCERIN (NITROSTAT) 0.4 MG SL tablet Place 1 tablet (0.4 mg total) under the tongue every 5 (five) minutes x 3 doses as needed for chest pain. 12/20/13  Yes Eileen Stanford, PA-C  ondansetron (ZOFRAN-ODT) 8 MG disintegrating tablet Take 1 tablet (8 mg total) by mouth every 8 (eight) hours as needed for nausea or vomiting. 10/04/14  Yes Kathyrn Drown, MD  Ticagrelor (BRILINTA) 90 MG TABS tablet Take 1 tablet (90 mg total) by mouth 2 (two) times daily. 12/20/13  Yes Eileen Stanford, PA-C  vitamin C (ASCORBIC ACID) 500 MG tablet Take 500 mg by mouth daily.   Yes Historical Provider, MD  colchicine 0.6 MG tablet Take 1 tablet (0.6 mg total) by mouth 2 (two) times daily. Patient not taking: Reported on 10/10/2014 09/14/14   Jasper Riling. Pickering, MD  predniSONE (DELTASONE) 20 MG tablet  Take 3 tablets (60 mg total) by mouth daily. Patient not taking: Reported on 10/10/2014 09/14/14   Jasper Riling. Alvino Chapel, MD     Results for orders placed or performed during the hospital encounter of 10/10/14 (from the past 48 hour(s))  Comprehensive metabolic panel     Status: Abnormal   Collection Time: 10/10/14 10:23 AM  Result Value Ref Range   Sodium 141 137 - 147 mEq/L   Potassium 3.7 3.7 - 5.3 mEq/L   Chloride 102 96 - 112 mEq/L   CO2 25 19 - 32 mEq/L   Glucose, Bld 153 (H) 70 - 99 mg/dL   BUN 18 6 - 23 mg/dL   Creatinine, Ser 1.10 0.50 - 1.10 mg/dL   Calcium 8.4 8.4 - 10.5 mg/dL   Total Protein 6.0 6.0 - 8.3 g/dL   Albumin 2.4 (L) 3.5 - 5.2 g/dL   AST 11 0 - 37 U/L   ALT 11 0 - 35 U/L   Alkaline Phosphatase 114 39 - 117 U/L   Total Bilirubin 0.6 0.3 - 1.2 mg/dL   GFR calc non Af Amer 48 (L) >90 mL/min   GFR calc Af Amer 56 (L) >90 mL/min    Comment: (NOTE) The eGFR has been calculated using the CKD EPI equation. This calculation has not been validated in all clinical situations. eGFR's persistently <90 mL/min signify possible Chronic Kidney Disease.    Anion gap 14 5 - 15  CBC with  Differential     Status: Abnormal   Collection Time: 10/10/14 10:23 AM  Result Value Ref Range   WBC 10.0 4.0 - 10.5 K/uL   RBC 3.55 (L) 3.87 - 5.11 MIL/uL   Hemoglobin 10.1 (L) 12.0 - 15.0 g/dL   HCT 31.1 (L) 36.0 - 46.0 %   MCV 87.6 78.0 - 100.0 fL   MCH 28.5 26.0 - 34.0 pg   MCHC 32.5 30.0 - 36.0 g/dL   RDW 14.8 11.5 - 15.5 %   Platelets 258 150 - 400 K/uL   Neutrophils Relative % 79 (H) 43 - 77 %   Neutro Abs 7.9 (H) 1.7 - 7.7 K/uL   Lymphocytes Relative 11 (L) 12 - 46 %   Lymphs Abs 1.1 0.7 - 4.0 K/uL   Monocytes Relative 10 3 - 12 %   Monocytes Absolute 1.0 0.1 - 1.0 K/uL   Eosinophils Relative 0 0 - 5 %   Eosinophils Absolute 0.0 0.0 - 0.7 K/uL   Basophils Relative 0 0 - 1 %   Basophils Absolute 0.0 0.0 - 0.1 K/uL  D-dimer, quantitative     Status: Abnormal   Collection Time: 10/10/14 10:23 AM  Result Value Ref Range   D-Dimer, Quant 2.34 (H) 0.00 - 0.48 ug/mL-FEU    Comment:        AT THE INHOUSE ESTABLISHED CUTOFF VALUE OF 0.48 ug/mL FEU, THIS ASSAY HAS BEEN DOCUMENTED IN THE LITERATURE TO HAVE A SENSITIVITY AND NEGATIVE PREDICTIVE VALUE OF AT LEAST 98 TO 99%.  THE TEST RESULT SHOULD BE CORRELATED WITH AN ASSESSMENT OF THE CLINICAL PROBABILITY OF DVT / VTE.   Troponin I     Status: None   Collection Time: 10/10/14 10:23 AM  Result Value Ref Range   Troponin I <0.30 <0.30 ng/mL    Comment:        Due to the release kinetics of cTnI, a negative result within the first hours of the onset of symptoms does not rule out myocardial infarction with certainty. If  myocardial infarction is still suspected, repeat the test at appropriate intervals.   Pro b natriuretic peptide     Status: Abnormal   Collection Time: 10/10/14 12:00 PM  Result Value Ref Range   Pro B Natriuretic peptide (BNP) 1084.0 (H) 0 - 125 pg/mL    Dg Chest 2 View  10/10/2014   CLINICAL DATA:  Chills, headache and body aches.  EXAM: CHEST  2 VIEW  COMPARISON:  PA and lateral chest and CT chest  08/12/2014.  FINDINGS: The patient has a new, small bilateral pleural effusions with minimal basilar atelectasis. The lungs are otherwise clear. There is cardiomegaly but no pulmonary edema.  IMPRESSION: Small bilateral pleural effusions and minimal basilar atelectasis.  Cardiomegaly without edema.   Electronically Signed   By: Inge Rise M.D.   On: 10/10/2014 11:00   Ct Angio Chest Pe W/cm &/or Wo Cm  10/10/2014   CLINICAL DATA:  Shortness of breath, chest pain with exertion.  EXAM: CT ANGIOGRAPHY CHEST WITH CONTRAST  TECHNIQUE: Multidetector CT imaging of the chest was performed using the standard protocol during bolus administration of intravenous contrast. Multiplanar CT image reconstructions and MIPs were obtained to evaluate the vascular anatomy.  CONTRAST:  119m OMNIPAQUE IOHEXOL 350 MG/ML SOLN  COMPARISON:  08/12/2014  FINDINGS: The pulmonary arteries are adequately opacified. There is no evidence of pulmonary embolism. New small bilateral pleural effusions and small pericardial effusion identified. Pulmonary venous hypertensive changes present without overt airspace edema. Stable appearance of a coronary stent in the distribution of the LAD. The thoracic aorta is of normal caliber.  No masses or enlarged lymph nodes are seen. The visualized upper abdomen is unremarkable. No significant bony abnormalities.  Review of the MIP images confirms the above findings.  IMPRESSION: 1. No evidence of pulmonary embolism. 2. New small bilateral pleural effusions and small pericardial effusion suggesting fluid overload. Pulmonary venous hypertensive changes without overt pulmonary edema.   Electronically Signed   By: GAletta EdouardM.D.   On: 10/10/2014 16:43    Review of Systems  Respiratory: Positive for shortness of breath.   Cardiovascular: Positive for chest pain. Negative for orthopnea, leg swelling and PND.  Neurological: Negative for dizziness and loss of consciousness.  All other systems  reviewed and are negative.  Blood pressure 104/90, pulse 101, temperature 97.9 F (36.6 C), temperature source Oral, resp. rate 18, height '5\' 5"'  (1.651 m), weight 143 lb (64.864 kg), SpO2 98 %. Physical Exam  Constitutional: She is oriented to person, place, and time. She appears well-developed and well-nourished. No distress.  Neck: No JVD present. Carotid bruit is not present.  Cardiovascular: Regular rhythm.  Tachycardia present.  Exam reveals no gallop and no friction rub.   No murmur heard. Pulses:      Radial pulses are 2+ on the right side, and 2+ on the left side.       Dorsalis pedis pulses are 2+ on the right side, and 2+ on the left side.  Respiratory: Effort normal and breath sounds normal. No respiratory distress. She has no wheezes. She has no rales. She exhibits tenderness.  GI: Soft. Bowel sounds are normal. She exhibits no distension. There is no tenderness.  Musculoskeletal: She exhibits no edema.  Neurological: She is alert and oriented to person, place, and time.  Skin: Skin is warm and dry. She is not diaphoretic.  Psychiatric: She has a normal mood and affect. Her behavior is normal.    Assessment/Plan: Atypical chest pain  1.  Atypical chest pain (? Pericarditis): Patient notes 6 weeks of pleuritic chest pain and mild dyspnea. Although d-dimer is elevated, CT of the chest is negative for PE. She notes that she had resolution of her symptoms several weeks ago while she was being treated for an acute gout flare  (was on prednisone at that time and thinks that she may have been colchicine as well). Symptoms recurred after discontination of gout medications. Her EKG demonstrates sinus tachycardia with a heart rate of 109 bpm. BP stable. EKG also demonstrates low-voltage QRS complexes in the precordial leads as well as some mild PR depressions. She also has reproducible pain with palpation of the chest wall. Her CT scan today was notable for small pericardial effusion, and  chest x-ray also demonstrates cardiomegaly. Pericarditis is likely the etiology of her symptoms. Will place on a trial of colchicine. ? Addition of an NSAID (either indomethacin or ibuprofen). Will defer to MD given she is on DAPT for recently place DES and has mild anemia. Will continue PPI for GI protection. Will check inflammatory markers including ESR and CRP. We'll also check a 2-D echocardiogram to further assess the pericardium and pericardial effusion. I feel that acute coronary syndrome is less likely, however we can continue to cycle cardiac enzymes x 3.   2. Anemia: Hgb is 10.1 (down from 12.7 3 months ago and 11.9 1 month ago). She has been on DAPT with ASA + Brilinta for recently placed DES < 1 year ago. She denies melena. However can check a FOBT.  3. CAD: S/P post STEMI in February 2015 with stenting of the LAD and RCA. Her recent chest pain is atypical for angina. However, we'll continue to cycle cardiac enzymes. Continue medical therapy: DAPT with aspirin plus Brilinta, Metroprolol, lisinopril and atorvastatin.  4. HLD: continue statin therapy.    SIMMONS, BRITTAINY 10/10/2014, 6:06 PM

## 2014-10-10 NOTE — ED Provider Notes (Signed)
CSN: [id number]     Arrival date & time 10/10/14  0912 History   First MD Initiated Contact with Patient 10/10/14 0935     Chief Complaint  Patient presents with  . Generalized Body Aches  . Chills  . Headache  . Nausea  . Weakness     (Consider location/radiation/quality/duration/timing/severity/associated sxs/prior Treatment) HPI  Patient reports she started getting chest pains about 5-8 weeks ago. She has been to the ER twice and has had a CT scan to look for blood clot, chest x-ray and EKGs with no diagnosis found. She states she has a history coronary artery disease with stenting and this pain feels different from that. Patient is also taking Brilinta. She states currently the chest pain started 2 nights ago and has been there constantly without relief. She states taking a big deep breath makes the pain worse. Nothing makes it feel better. The pain is in the center and slightly left of her chest and is described as sharp. She has nausea without vomiting, she has had diaphoresis. She states the pain radiates into her left neck and into her left arm into her forearm. She has not tried any medications other than her chronic hydrocodone which she states helps briefly. She states she feels short of breath. She denies swelling of her extremities. Patient states that when she gets the pain she "hollers when she breathes". EMS brought her to the ER and gave her no medications.  PCP Dr [facility] Dr Claiborne Billings, next appt in March  Past Medical History  Diagnosis Date  . Bronchitis   . Irritable bowel syndrome   . Polyarthralgia 1998  . Tachycardia   . Prediabetes   . Hyperlipidemia   . Type 2 diabetes mellitus   . Arthralgia   . Acute MI Feb. 2015    2 stents placed   Past Surgical History  Procedure Laterality Date  . Coronary angioplasty with stent placement     Family History  Problem Relation Age of Onset  . Diabetes Mother   . Diabetes Daughter   . Diabetes Son     History  Substance Use Topics  . Smoking status: Never Smoker   . Smokeless tobacco: Not on file  . Alcohol Use: No  no second hand smoke Lives with son  OB History    No data available     Review of Systems  All other systems reviewed and are negative.     Allergies  Actos; Ceftin; Celebrex; Darvocet; Doxycycline; Erythromycin; Sulfa antibiotics; Vioxx; Zithromax; and Zocor  Home Medications   Prior to Admission medications   Medication Sig Start Date End Date Taking? Authorizing Provider  alendronate (FOSAMAX) 70 MG tablet Take 1 tablet (70 mg total) by mouth once a week. Take with a full glass of water on an empty stomach. Patient taking differently: Take 70 mg by mouth once a week. Take with a full glass of water on an empty stomach.(wednesday) 06/14/14  Yes Kathyrn Drown, MD  ALPRAZolam Duanne Moron) 0.5 MG tablet TAKE ONE TABLET TWICE DAILY AS NEEDED 08/23/14  Yes Kathyrn Drown, MD  aspirin EC 81 MG EC tablet Take 1 tablet (81 mg total) by mouth daily. 12/20/13  Yes Eileen Stanford, PA-C  atorvastatin (LIPITOR) 40 MG tablet Take 1 tablet (40 mg total) by mouth daily at 6 PM. 10/04/14  Yes Kathyrn Drown, MD  diphenoxylate-atropine (LOMOTIL) 2.5-0.025 MG per tablet Take 1 tablet by mouth 3 (three) times daily as needed  for diarrhea or loose stools. 09/19/14  Yes Kathyrn Drown, MD  esomeprazole (NEXIUM) 40 MG capsule Take 1 capsule (40 mg total) by mouth every morning. 10/04/14  Yes Kathyrn Drown, MD  hydrochlorothiazide (HYDRODIURIL) 25 MG tablet Take 0.5 tablets (12.5 mg total) by mouth daily. 10/04/14  Yes Kathyrn Drown, MD  HYDROcodone-acetaminophen (NORCO/VICODIN) 5-325 MG per tablet Take 1 tablet po 4 times daily prn 10/04/14  Yes Kathyrn Drown, MD  ketorolac (ACULAR) 0.5 % ophthalmic solution Place 1 drop into both eyes 3 (three) times daily.    Yes Historical Provider, MD  lisinopril (PRINIVIL,ZESTRIL) 2.5 MG tablet Take 1 tablet (2.5 mg total) by mouth daily. 06/21/14   Yes Kathyrn Drown, MD  loratadine (CLARITIN) 10 MG tablet Take 1 tablet (10 mg total) by mouth daily. 10/04/14  Yes Kathyrn Drown, MD  metFORMIN (GLUCOPHAGE) 500 MG tablet Take 1 tablet (500 mg total) by mouth 2 (two) times daily with a meal. 10/04/14  Yes Kathyrn Drown, MD  metoprolol succinate (TOPROL-XL) 50 MG 24 hr tablet Take 25-50 mg by mouth 2 (two) times daily. Takes one whole tablet in the morning and takes one-half tablet at bedtimeTake with or immediately following a meal.   Yes Historical Provider, MD  Multiple Vitamin (MULTIVITAMIN) tablet Take 1 tablet by mouth daily.   Yes Historical Provider, MD  nitroGLYCERIN (NITROSTAT) 0.4 MG SL tablet Place 1 tablet (0.4 mg total) under the tongue every 5 (five) minutes x 3 doses as needed for chest pain. 12/20/13  Yes Eileen Stanford, PA-C  ondansetron (ZOFRAN-ODT) 8 MG disintegrating tablet Take 1 tablet (8 mg total) by mouth every 8 (eight) hours as needed for nausea or vomiting. 10/04/14  Yes Kathyrn Drown, MD  Ticagrelor (BRILINTA) 90 MG TABS tablet Take 1 tablet (90 mg total) by mouth 2 (two) times daily. 12/20/13  Yes Eileen Stanford, PA-C  vitamin C (ASCORBIC ACID) 500 MG tablet Take 500 mg by mouth daily.   Yes Historical Provider, MD  colchicine 0.6 MG tablet Take 1 tablet (0.6 mg total) by mouth 2 (two) times daily. Patient not taking: Reported on 10/10/2014 09/14/14   Jasper Riling. Pickering, MD  predniSONE (DELTASONE) 20 MG tablet Take 3 tablets (60 mg total) by mouth daily. Patient not taking: Reported on 10/10/2014 09/14/14   Jasper Riling. Pickering, MD   BP 108/54 mmHg  Pulse 106  Temp(Src) 98.6 F (37 C) (Oral)  Resp 27  Ht 5\' 5"  (1.651 m)  Wt 143 lb (64.864 kg)  BMI 23.80 kg/m2  SpO2 96%  Vital signs normal except tachycardia  Physical Exam  Constitutional: She is oriented to person, place, and time. She appears well-developed and well-nourished.  Non-toxic appearance. She does not appear ill. No distress.  HENT:  Head:  Normocephalic and atraumatic.  Right Ear: External ear normal.  Left Ear: External ear normal.  Nose: Nose normal. No mucosal edema or rhinorrhea.  Mouth/Throat: Oropharynx is clear and moist and mucous membranes are normal. No dental abscesses or uvula swelling.  Eyes: Conjunctivae and EOM are normal. Pupils are equal, round, and reactive to light.  Neck: Normal range of motion and full passive range of motion without pain. Neck supple.  Cardiovascular: Normal rate, regular rhythm and normal heart sounds.  Exam reveals no gallop and no friction rub.   No murmur heard. Pulmonary/Chest: Effort normal and breath sounds normal. No respiratory distress. She has no wheezes. She has no rhonchi. She has  no rales. She exhibits no tenderness and no crepitus.    Area of pain noted  Abdominal: Soft. Normal appearance and bowel sounds are normal. She exhibits no distension. There is no tenderness. There is no rebound and no guarding.  Musculoskeletal: Normal range of motion. She exhibits no edema or tenderness.  Moves all extremities well.   Neurological: She is alert and oriented to person, place, and time. She has normal strength. No cranial nerve deficit.  Skin: Skin is warm, dry and intact. No rash noted. No erythema. No pallor.  Psychiatric: She has a normal mood and affect. Her speech is normal and behavior is normal. Her mood appears not anxious.  Nursing note and vitals reviewed.   ED Course  Procedures (including critical care time)  Medications  acetaminophen (TYLENOL) tablet 650 mg (650 mg Oral Given 10/10/14 1120)  iohexol (OMNIPAQUE) 350 MG/ML injection 100 mL (100 mLs Intravenous Contrast Given 10/10/14 1620)     Pt left with Dr Stark Jock at 16:40 to get her CT angio results, she may need admission for CHF (cardiologist Dr Claiborne Billings)  CLINICAL DATA: Left-sided chest pain. Worse with deep inspiration.  EXAM: CT ANGIOGRAPHY CHEST WITH CONTRAST  TECHNIQUE: Multidetector CT imaging of  the chest was performed using the standard protocol during bolus administration of intravenous contrast. Multiplanar CT image reconstructions and MIPs were obtained to evaluate the vascular anatomy.  CONTRAST: 59mL OMNIPAQUE IOHEXOL 350 MG/ML SOLN  COMPARISON: None.  FINDINGS: No evidence for pulmonary embolism.  Visualized thyroid is unremarkable. No enlarged axillary, mediastinal or hilar lymphadenopathy. There is an 8 mm right paratracheal lymph node. Normal heart size. No pericardial effusion. Small hiatal hernia.  Central airways are patent. Subpleural ground-glass pulmonary opacities most suggestive of atelectasis. No large consolidative pulmonary opacity. Biapical pleural parenchymal thickening. No pleural effusion or pneumothorax.  Visualization of the upper abdomen demonstrates calcified granuloma within the liver. No aggressive or acute appearing osseous lesions.  Review of the MIP images confirms the above findings.  IMPRESSION: No evidence for pulmonary embolism.   Electronically Signed  By: Lovey Newcomer M.D.  On: 08/12/2014 21:34   Labs Review Results for orders placed or performed during the hospital encounter of 10/10/14  Comprehensive metabolic panel  Result Value Ref Range   Sodium 141 137 - 147 mEq/L   Potassium 3.7 3.7 - 5.3 mEq/L   Chloride 102 96 - 112 mEq/L   CO2 25 19 - 32 mEq/L   Glucose, Bld 153 (H) 70 - 99 mg/dL   BUN 18 6 - 23 mg/dL   Creatinine, Ser 1.10 0.50 - 1.10 mg/dL   Calcium 8.4 8.4 - 10.5 mg/dL   Total Protein 6.0 6.0 - 8.3 g/dL   Albumin 2.4 (L) 3.5 - 5.2 g/dL   AST 11 0 - 37 U/L   ALT 11 0 - 35 U/L   Alkaline Phosphatase 114 39 - 117 U/L   Total Bilirubin 0.6 0.3 - 1.2 mg/dL   GFR calc non Af Amer 48 (L) >90 mL/min   GFR calc Af Amer 56 (L) >90 mL/min   Anion gap 14 5 - 15  CBC with Differential  Result Value Ref Range   WBC 10.0 4.0 - 10.5 K/uL   RBC 3.55 (L) 3.87 - 5.11 MIL/uL   Hemoglobin 10.1 (L) 12.0  - 15.0 g/dL   HCT 31.1 (L) 36.0 - 46.0 %   MCV 87.6 78.0 - 100.0 fL   MCH 28.5 26.0 - 34.0 pg   MCHC 32.5  30.0 - 36.0 g/dL   RDW 14.8 11.5 - 15.5 %   Platelets 258 150 - 400 K/uL   Neutrophils Relative % 79 (H) 43 - 77 %   Neutro Abs 7.9 (H) 1.7 - 7.7 K/uL   Lymphocytes Relative 11 (L) 12 - 46 %   Lymphs Abs 1.1 0.7 - 4.0 K/uL   Monocytes Relative 10 3 - 12 %   Monocytes Absolute 1.0 0.1 - 1.0 K/uL   Eosinophils Relative 0 0 - 5 %   Eosinophils Absolute 0.0 0.0 - 0.7 K/uL   Basophils Relative 0 0 - 1 %   Basophils Absolute 0.0 0.0 - 0.1 K/uL  D-dimer, quantitative  Result Value Ref Range   D-Dimer, Quant 2.34 (H) 0.00 - 0.48 ug/mL-FEU  Troponin I  Result Value Ref Range   Troponin I <0.30 <0.30 ng/mL  Pro b natriuretic peptide  Result Value Ref Range   Pro B Natriuretic peptide (BNP) 1084.0 (H) 0 - 125 pg/mL    Laboratory interpretation all normal except + ddimer, elevated BNP, anemia   Imaging Review Dg Chest 2 View  10/10/2014   CLINICAL DATA:  Chills, headache and body aches.  EXAM: CHEST  2 VIEW  COMPARISON:  PA and lateral chest and CT chest 08/12/2014.  FINDINGS: The patient has a new, small bilateral pleural effusions with minimal basilar atelectasis. The lungs are otherwise clear. There is cardiomegaly but no pulmonary edema.  IMPRESSION: Small bilateral pleural effusions and minimal basilar atelectasis.  Cardiomegaly without edema.   Electronically Signed   By: Inge Rise M.D.   On: 10/10/2014 11:00     EKG Interpretation   Date/Time:  Monday October 10 2014 09:18:42 EST Ventricular Rate:  109 PR Interval:  128 QRS Duration: 75 QT Interval:  336 QTC Calculation: 452 R Axis:   70 Text Interpretation:  Sinus tachycardia Ventricular premature complex Low  voltage, precordial leads Nonspecific T abnrm, anterolateral leads No  significant change since last tracing 14 Sep 2014 Confirmed by [facility]   MD-I, Torri Langston ([id number]) on 10/10/2014 9:44:33 AM      MDM    Final diagnoses:  Atypical chest pain  Shortness of breath  D-dimer, elevated    Disposition pending by Dr Eddie Candle, MD, Alanson Aly, MD 10/10/14 [phone]

## 2014-10-10 NOTE — ED Notes (Signed)
MD at bedside. Dr. Stark Jock.

## 2014-10-11 DIAGNOSIS — I319 Disease of pericardium, unspecified: Secondary | ICD-10-CM

## 2014-10-11 DIAGNOSIS — E785 Hyperlipidemia, unspecified: Secondary | ICD-10-CM

## 2014-10-11 DIAGNOSIS — R079 Chest pain, unspecified: Secondary | ICD-10-CM

## 2014-10-11 LAB — CBC
HEMATOCRIT: 31.5 % — AB (ref 36.0–46.0)
HEMOGLOBIN: 10 g/dL — AB (ref 12.0–15.0)
MCH: 27.5 pg (ref 26.0–34.0)
MCHC: 31.7 g/dL (ref 30.0–36.0)
MCV: 86.8 fL (ref 78.0–100.0)
Platelets: 296 10*3/uL (ref 150–400)
RBC: 3.63 MIL/uL — AB (ref 3.87–5.11)
RDW: 14.8 % (ref 11.5–15.5)
WBC: 9.7 10*3/uL (ref 4.0–10.5)

## 2014-10-11 LAB — BASIC METABOLIC PANEL
Anion gap: 14 (ref 5–15)
BUN: 16 mg/dL (ref 6–23)
CHLORIDE: 99 meq/L (ref 96–112)
CO2: 26 meq/L (ref 19–32)
CREATININE: 1.17 mg/dL — AB (ref 0.50–1.10)
Calcium: 8.6 mg/dL (ref 8.4–10.5)
GFR calc non Af Amer: 45 mL/min — ABNORMAL LOW (ref >90)
GFR, EST AFRICAN AMERICAN: 52 mL/min — AB (ref >90)
GLUCOSE: 130 mg/dL — AB (ref 70–99)
POTASSIUM: 3.4 meq/L — AB (ref 3.7–5.3)
Sodium: 139 mEq/L (ref 137–147)

## 2014-10-11 LAB — GLUCOSE, CAPILLARY
GLUCOSE-CAPILLARY: 117 mg/dL — AB (ref 70–99)
GLUCOSE-CAPILLARY: 145 mg/dL — AB (ref 70–99)
Glucose-Capillary: 151 mg/dL — ABNORMAL HIGH (ref 70–99)
Glucose-Capillary: 152 mg/dL — ABNORMAL HIGH (ref 70–99)

## 2014-10-11 LAB — PRO B NATRIURETIC PEPTIDE: Pro B Natriuretic peptide (BNP): 1454 pg/mL — ABNORMAL HIGH (ref 0–125)

## 2014-10-11 LAB — C-REACTIVE PROTEIN: CRP: 29.7 mg/dL — AB (ref <0.60)

## 2014-10-11 NOTE — Progress Notes (Signed)
  Echocardiogram 2D Echocardiogram has been performed.  Elizabeth Aguilar 10/11/2014, 10:57 AM

## 2014-10-11 NOTE — Care Management Note (Unsigned)
    Page 1 of 1   10/13/2014     10:59:15 AM CARE MANAGEMENT NOTE 10/13/2014  Patient:  Elizabeth Aguilar, Elizabeth Aguilar   Account Number:  [id number]  Date Initiated:  10/11/2014  Documentation initiated by:  GRAVES-BIGELOW,Ketra Duchesne  Subjective/Objective Assessment:   Pt admitted for chest pain and dyspnea. CT scan did show new small bilateral pleural effusions and small pericardial effusion, suggestive of fluid overload.  Pt is from home with son. DME RW/Cane.     Action/Plan:   CM will continue to monitor for disposition needs.   Anticipated DC Date:  10/13/2014   Anticipated DC Plan:  [place]  CM consult      Choice offered to / List presented to:             Status of service:  In process, will continue to follow Medicare Important Message given?  YES (If response is "NO", the following Medicare IM given date fields will be blank) Date Medicare IM given:  10/13/2014 Medicare IM given by:  GRAVES-BIGELOW,Verdia Bolt Date Additional Medicare IM given:   Additional Medicare IM given by:    Discharge Disposition:    Per UR Regulation:  Reviewed for med. necessity/level of care/duration of stay  If discussed at [place] of Stay Meetings, dates discussed:    Comments:

## 2014-10-11 NOTE — Progress Notes (Signed)
Subjective: Still with CP  Pleuritic  COnstant.  Breathing OK Objective: Filed Vitals:   10/10/14 1750 10/10/14 1928 10/10/14 2052 10/11/14 0535  BP: 104/90 131/58 149/70 111/50  Pulse: 101  119 110  Temp: 97.9 F (36.6 C) 98 F (36.7 C) 97.4 F (36.3 C) 98.3 F (36.8 C)  TempSrc: Oral Oral  Oral  Resp: 18 18 20 18   Height:      Weight:   141 lb 11.2 oz (64.275 kg) 141 lb 12.8 oz (64.32 kg)  SpO2: 98% 98% 100% 93%   Weight change:   Intake/Output Summary (Last 24 hours) at 10/11/14 0810 Last data filed at 10/11/14 0537  Gross per 24 hour  Intake      0 ml  Output   1000 ml  Net  -1000 ml    General: Alert, awake, oriented x3, in no acute distress Neck:  JVP is normal Heart: Regular rate and rhythm, without murmurs, rubs, gallops.  Chest  Very tender to palpitation   Lungs: Clear to auscultation.  No rales or wheezes. Exemities:  No edema.   Neuro: Grossly intact, nonfocal.   Lab Results: Results for orders placed or performed during the hospital encounter of 10/10/14 (from the past 24 hour(s))  Comprehensive metabolic panel     Status: Abnormal   Collection Time: 10/10/14 10:23 AM  Result Value Ref Range   Sodium 141 137 - 147 mEq/L   Potassium 3.7 3.7 - 5.3 mEq/L   Chloride 102 96 - 112 mEq/L   CO2 25 19 - 32 mEq/L   Glucose, Bld 153 (H) 70 - 99 mg/dL   BUN 18 6 - 23 mg/dL   Creatinine, Ser 1.10 0.50 - 1.10 mg/dL   Calcium 8.4 8.4 - 10.5 mg/dL   Total Protein 6.0 6.0 - 8.3 g/dL   Albumin 2.4 (L) 3.5 - 5.2 g/dL   AST 11 0 - 37 U/L   ALT 11 0 - 35 U/L   Alkaline Phosphatase 114 39 - 117 U/L   Total Bilirubin 0.6 0.3 - 1.2 mg/dL   GFR calc non Af Amer 48 (L) >90 mL/min   GFR calc Af Amer 56 (L) >90 mL/min   Anion gap 14 5 - 15  CBC with Differential     Status: Abnormal   Collection Time: 10/10/14 10:23 AM  Result Value Ref Range   WBC 10.0 4.0 - 10.5 K/uL   RBC 3.55 (L) 3.87 - 5.11 MIL/uL   Hemoglobin 10.1 (L) 12.0 - 15.0 g/dL   HCT 31.1 (L) 36.0 -  46.0 %   MCV 87.6 78.0 - 100.0 fL   MCH 28.5 26.0 - 34.0 pg   MCHC 32.5 30.0 - 36.0 g/dL   RDW 14.8 11.5 - 15.5 %   Platelets 258 150 - 400 K/uL   Neutrophils Relative % 79 (H) 43 - 77 %   Neutro Abs 7.9 (H) 1.7 - 7.7 K/uL   Lymphocytes Relative 11 (L) 12 - 46 %   Lymphs Abs 1.1 0.7 - 4.0 K/uL   Monocytes Relative 10 3 - 12 %   Monocytes Absolute 1.0 0.1 - 1.0 K/uL   Eosinophils Relative 0 0 - 5 %   Eosinophils Absolute 0.0 0.0 - 0.7 K/uL   Basophils Relative 0 0 - 1 %   Basophils Absolute 0.0 0.0 - 0.1 K/uL  D-dimer, quantitative     Status: Abnormal   Collection Time: 10/10/14 10:23 AM  Result Value Ref Range  D-Dimer, Quant 2.34 (H) 0.00 - 0.48 ug/mL-FEU  Troponin I     Status: None   Collection Time: 10/10/14 10:23 AM  Result Value Ref Range   Troponin I <0.30 <0.30 ng/mL  Pro b natriuretic peptide     Status: Abnormal   Collection Time: 10/10/14 12:00 PM  Result Value Ref Range   Pro B Natriuretic peptide (BNP) 1084.0 (H) 0 - 125 pg/mL  Glucose, capillary     Status: Abnormal   Collection Time: 10/10/14  8:15 PM  Result Value Ref Range   Glucose-Capillary 108 (H) 70 - 99 mg/dL  C-reactive protein     Status: Abnormal   Collection Time: 10/10/14  9:17 PM  Result Value Ref Range   CRP 29.7 (H) <0.60 mg/dL  Sedimentation rate     Status: Abnormal   Collection Time: 10/10/14  9:17 PM  Result Value Ref Range   Sed Rate 83 (H) 0 - 22 mm/hr  Basic metabolic panel     Status: Abnormal   Collection Time: 10/11/14  3:21 AM  Result Value Ref Range   Sodium 139 137 - 147 mEq/L   Potassium 3.4 (L) 3.7 - 5.3 mEq/L   Chloride 99 96 - 112 mEq/L   CO2 26 19 - 32 mEq/L   Glucose, Bld 130 (H) 70 - 99 mg/dL   BUN 16 6 - 23 mg/dL   Creatinine, Ser 1.17 (H) 0.50 - 1.10 mg/dL   Calcium 8.6 8.4 - 10.5 mg/dL   GFR calc non Af Amer 45 (L) >90 mL/min   GFR calc Af Amer 52 (L) >90 mL/min   Anion gap 14 5 - 15  CBC     Status: Abnormal   Collection Time: 10/11/14  3:21 AM  Result  Value Ref Range   WBC 9.7 4.0 - 10.5 K/uL   RBC 3.63 (L) 3.87 - 5.11 MIL/uL   Hemoglobin 10.0 (L) 12.0 - 15.0 g/dL   HCT 31.5 (L) 36.0 - 46.0 %   MCV 86.8 78.0 - 100.0 fL   MCH 27.5 26.0 - 34.0 pg   MCHC 31.7 30.0 - 36.0 g/dL   RDW 14.8 11.5 - 15.5 %   Platelets 296 150 - 400 K/uL  Glucose, capillary     Status: Abnormal   Collection Time: 10/11/14  7:33 AM  Result Value Ref Range   Glucose-Capillary 117 (H) 70 - 99 mg/dL    Studies/Results: Dg Chest 2 View  10/10/2014   CLINICAL DATA:  Chills, headache and body aches.  EXAM: CHEST  2 VIEW  COMPARISON:  PA and lateral chest and CT chest 08/12/2014.  FINDINGS: The patient has a new, small bilateral pleural effusions with minimal basilar atelectasis. The lungs are otherwise clear. There is cardiomegaly but no pulmonary edema.  IMPRESSION: Small bilateral pleural effusions and minimal basilar atelectasis.  Cardiomegaly without edema.   Electronically Signed   By: Inge Rise M.D.   On: 10/10/2014 11:00   Ct Angio Chest Pe W/cm &/or Wo Cm  10/10/2014   CLINICAL DATA:  Shortness of breath, chest pain with exertion.  EXAM: CT ANGIOGRAPHY CHEST WITH CONTRAST  TECHNIQUE: Multidetector CT imaging of the chest was performed using the standard protocol during bolus administration of intravenous contrast. Multiplanar CT image reconstructions and MIPs were obtained to evaluate the vascular anatomy.  CONTRAST:  192mL OMNIPAQUE IOHEXOL 350 MG/ML SOLN  COMPARISON:  08/12/2014  FINDINGS: The pulmonary arteries are adequately opacified. There is no evidence of pulmonary embolism. New  small bilateral pleural effusions and small pericardial effusion identified. Pulmonary venous hypertensive changes present without overt airspace edema. Stable appearance of a coronary stent in the distribution of the LAD. The thoracic aorta is of normal caliber.  No masses or enlarged lymph nodes are seen. The visualized upper abdomen is unremarkable. No significant bony  abnormalities.  Review of the MIP images confirms the above findings.  IMPRESSION: 1. No evidence of pulmonary embolism. 2. New small bilateral pleural effusions and small pericardial effusion suggesting fluid overload. Pulmonary venous hypertensive changes without overt pulmonary edema.   Electronically Signed   By: Aletta Edouard M.D.   On: 10/10/2014 16:43    Medications:Reviewed     @PROBHOSP @  1.  CP  Suspicous for pericarditis + poss pleuritis.  CRP signif elevated    On colchicine and pain meds  Reluctant to use NSAIDS with DAPT and anemia  Echo today  May start steroids Check ANA and BNP    2.  CAD  Not convinced on active ischemia    3  HL  COntinue statin    LOS: 1 day   Elizabeth Aguilar 10/11/2014, 8:10 AM

## 2014-10-12 DIAGNOSIS — I2510 Atherosclerotic heart disease of native coronary artery without angina pectoris: Secondary | ICD-10-CM

## 2014-10-12 DIAGNOSIS — Z9861 Coronary angioplasty status: Secondary | ICD-10-CM

## 2014-10-12 DIAGNOSIS — I5031 Acute diastolic (congestive) heart failure: Secondary | ICD-10-CM

## 2014-10-12 DIAGNOSIS — I10 Essential (primary) hypertension: Secondary | ICD-10-CM

## 2014-10-12 LAB — CBC
HCT: 31.3 % — ABNORMAL LOW (ref 36.0–46.0)
Hemoglobin: 10.4 g/dL — ABNORMAL LOW (ref 12.0–15.0)
MCH: 29.1 pg (ref 26.0–34.0)
MCHC: 33.2 g/dL (ref 30.0–36.0)
MCV: 87.7 fL (ref 78.0–100.0)
Platelets: 366 10*3/uL (ref 150–400)
RBC: 3.57 MIL/uL — ABNORMAL LOW (ref 3.87–5.11)
RDW: 14.8 % (ref 11.5–15.5)
WBC: 7.6 10*3/uL (ref 4.0–10.5)

## 2014-10-12 LAB — BASIC METABOLIC PANEL
Anion gap: 14 (ref 5–15)
BUN: 19 mg/dL (ref 6–23)
CO2: 26 mEq/L (ref 19–32)
Calcium: 8.6 mg/dL (ref 8.4–10.5)
Chloride: 102 mEq/L (ref 96–112)
Creatinine, Ser: 1.29 mg/dL — ABNORMAL HIGH (ref 0.50–1.10)
GFR calc Af Amer: 46 mL/min — ABNORMAL LOW (ref >90)
GFR calc non Af Amer: 40 mL/min — ABNORMAL LOW (ref >90)
Glucose, Bld: 121 mg/dL — ABNORMAL HIGH (ref 70–99)
Potassium: 3.6 mEq/L — ABNORMAL LOW (ref 3.7–5.3)
Sodium: 142 mEq/L (ref 137–147)

## 2014-10-12 LAB — GLUCOSE, CAPILLARY
Glucose-Capillary: 119 mg/dL — ABNORMAL HIGH (ref 70–99)
Glucose-Capillary: 182 mg/dL — ABNORMAL HIGH (ref 70–99)
Glucose-Capillary: 121 mg/dL — ABNORMAL HIGH (ref 70–99)
Glucose-Capillary: 227 mg/dL — ABNORMAL HIGH (ref 70–99)

## 2014-10-12 LAB — OCCULT BLOOD X 1 CARD TO LAB, STOOL: Fecal Occult Bld: NEGATIVE

## 2014-10-12 LAB — ANA: ANA: NEGATIVE

## 2014-10-12 MED ORDER — ONDANSETRON HCL 4 MG/2ML IJ SOLN
4.0000 mg | Freq: Four times a day (QID) | INTRAMUSCULAR | Status: DC | PRN
  Administered 2014-10-12 – 2014-10-13 (×3): 4 mg via INTRAVENOUS
  Filled 2014-10-12 (×3): qty 2

## 2014-10-12 MED ORDER — POTASSIUM CHLORIDE CRYS ER 20 MEQ PO TBCR
40.0000 meq | EXTENDED_RELEASE_TABLET | Freq: Once | ORAL | Status: AC
  Administered 2014-10-12: 40 meq via ORAL
  Filled 2014-10-12: qty 2

## 2014-10-12 MED ORDER — FUROSEMIDE 10 MG/ML IJ SOLN
40.0000 mg | Freq: Once | INTRAMUSCULAR | Status: AC
  Administered 2014-10-12: 40 mg via INTRAVENOUS
  Filled 2014-10-12: qty 4

## 2014-10-12 NOTE — Plan of Care (Signed)
Problem: Phase I Progression Outcomes Goal: Hemodynamically stable Outcome: Completed/Met Date Met:  10/12/14 Goal: Anginal pain relieved Outcome: Completed/Met Date Met:  10/12/14 Goal: Voiding-avoid urinary catheter unless indicated Outcome: Completed/Met Date Met:  10/12/14

## 2014-10-12 NOTE — Progress Notes (Signed)
Patient Name: Elizabeth Aguilar Date of Encounter: 10/12/2014     Principal Problem:   Pericarditis Active Problems:   Hyperlipemia   Essential hypertension, benign   Diabetes type 2, controlled   STEMI 12/16/13- LAD DES   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   Atypical chest pain   Chest pain    SUBJECTIVE  Still with chest pain worse with inspiration. She does think it is better since starting the colchicine. Also has episodes of feeling like she is gasping for air. This has been new since Friday.   CURRENT MEDS . aspirin EC  81 mg Oral Daily  . atorvastatin  40 mg Oral q1800  . colchicine  0.6 mg Oral BID  . hydrochlorothiazide  12.5 mg Oral Daily  . lisinopril  2.5 mg Oral Daily  . metoprolol succinate  25 mg Oral QHS  . metoprolol succinate  50 mg Oral Daily  . pantoprazole  80 mg Oral Q1200  . ticagrelor  90 mg Oral BID    OBJECTIVE  Filed Vitals:   10/10/14 2052 10/11/14 0535 10/11/14 2002 10/12/14 0527  BP: 149/70 111/50 104/57 123/59  Pulse: 119 110 95 95  Temp: 97.4 F (36.3 C) 98.3 F (36.8 C) 98.4 F (36.9 C) 98.2 F (36.8 C)  TempSrc:  Oral Oral Oral  Resp: '20 18 18 18  ' Height:      Weight: 141 lb 11.2 oz (64.275 kg) 141 lb 12.8 oz (64.32 kg)  138 lb 14.4 oz (63.005 kg)  SpO2: 100% 93% 94% 95%    Intake/Output Summary (Last 24 hours) at 10/12/14 0935 Last data filed at 10/12/14 0528  Gross per 24 hour  Intake      0 ml  Output    850 ml  Net   -850 ml   Filed Weights   10/10/14 2052 10/11/14 0535 10/12/14 0527  Weight: 141 lb 11.2 oz (64.275 kg) 141 lb 12.8 oz (64.32 kg) 138 lb 14.4 oz (63.005 kg)    PHYSICAL EXAM  General: Pleasant, NAD. Neuro: Alert and oriented X 3. Moves all extremities spontaneously. Psych: Normal affect. HEENT:  Normal  Neck: Supple without bruits or JVD. Lungs:  Resp regular and unlabored, CTA. Heart: tachy. no s3, s4, or murmurs. Abdomen: Soft, non-tender, non-distended, BS + x 4.  Extremities: No clubbing, cyanosis or  edema. DP/PT/Radials 2+ and equal bilaterally.  Accessory Clinical Findings  CBC  Recent Labs  10/10/14 1023 10/11/14 0321 10/12/14 0421  WBC 10.0 9.7 7.6  NEUTROABS 7.9*  --   --   HGB 10.1* 10.0* 10.4*  HCT 31.1* 31.5* 31.3*  MCV 87.6 86.8 87.7  PLT 258 296 179   Basic Metabolic Panel  Recent Labs  10/11/14 0321 10/12/14 0421  NA 139 142  K 3.4* 3.6*  CL 99 102  CO2 26 26  GLUCOSE 130* 121*  BUN 16 19  CREATININE 1.17* 1.29*  CALCIUM 8.6 8.6   Liver Function Tests  Recent Labs  10/10/14 1023  AST 11  ALT 11  ALKPHOS 114  BILITOT 0.6  PROT 6.0  ALBUMIN 2.4*    Cardiac Enzymes  Recent Labs  10/10/14 1023  TROPONINI <0.30   BNP Invalid input(s): POCBNP D-Dimer  Recent Labs  10/10/14 1023  DDIMER 2.34*    TELE  Sinus tach with some PVCS  Radiology/Studies  Dg Chest 2 View  10/10/2014   CLINICAL DATA:  Chills, headache and body aches.  EXAM: CHEST  2 VIEW  COMPARISON:  PA and lateral chest and CT chest 08/12/2014.  FINDINGS: The patient has a new, small bilateral pleural effusions with minimal basilar atelectasis. The lungs are otherwise clear. There is cardiomegaly but no pulmonary edema.  IMPRESSION: Small bilateral pleural effusions and minimal basilar atelectasis.  Cardiomegaly without edema.   Electronically Signed   By: Inge Rise M.D.   On: 10/10/2014 11:00   Ct Angio Chest Pe W/cm &/or Wo Cm  10/10/2014   CLINICAL DATA:  Shortness of breath, chest pain with exertion.  EXAM: CT ANGIOGRAPHY CHEST WITH CONTRAST  TECHNIQUE: Multidetector CT imaging of the chest was performed using the standard protocol during bolus administration of intravenous contrast. Multiplanar CT image reconstructions and MIPs were obtained to evaluate the vascular anatomy.  CONTRAST:  177m OMNIPAQUE IOHEXOL 350 MG/ML SOLN  COMPARISON:  08/12/2014  FINDINGS: The pulmonary arteries are adequately opacified. There is no evidence of pulmonary embolism. New small  bilateral pleural effusions and small pericardial effusion identified. Pulmonary venous hypertensive changes present without overt airspace edema. Stable appearance of a coronary stent in the distribution of the LAD. The thoracic aorta is of normal caliber.  No masses or enlarged lymph nodes are seen. The visualized upper abdomen is unremarkable. No significant bony abnormalities.  Review of the MIP images confirms the above findings.  IMPRESSION: 1. No evidence of pulmonary embolism. 2. New small bilateral pleural effusions and small pericardial effusion suggesting fluid overload. Pulmonary venous hypertensive changes without overt pulmonary edema.   Electronically Signed   By: GAletta EdouardM.D.   On: 10/10/2014 16:43    2D ECHO: 10/11/2014 LV EF: 45% -  50% Study Conclusions - Left ventricle: The cavity size was normal. Wall thickness was normal. Systolic function was mildly reduced. The estimated ejection fraction was in the range of 45% to 50%. Regional wall motion abnormalities cannot be excluded. Doppler parameters are consistent with abnormal left ventricular relaxation (grade 1 diastolic dysfunction). - Pericardium, extracardiac: A moderate, free-flowing pericardial effusion was identified circumferential to the heart. The fluid had no internal echoes.There was no evidence of hemodynamic compromise.   ASSESSMENT AND PLAN  MASHELYNN MARKSis a 73y.o. female with a history of CAD s/p STEMI s/p DES to LAD and RCA (12/2013), HLD, diabetes, ischemic CM ( EF 25%--> 40%) [facility].   Atypical chest pain (? Pericarditis):  -- D-dimer elevated, but CT of the chest is negative for PE. However, CT scan notable for small pericardial effusion, and chest x-ray  also demonstrates cardiomegaly.  -- Suspicous for pericarditis + poss pleuritis. CRP signif elevated (29.7) ESR 83. -- BNP elevated at 1454. ANA pending.  -- 2D ECHO yesterday with  EF 45-50%, G1DD, moderate, free-flowing pericardial effusion identified circumferential to the heart. No signs of HD compromise.  -- Continue colchicine and pain meds. Reluctant to use NSAIDS with DAPT and anemia  -- May add steroids per MD.  Acute on chronic systolic CHF/Ischemic CM-  2D ECHO yesterday with EF 45-50%, G1DD, moderate, free-flowing pericardial effusion identified circumferential to the heart. No signs of HD compromise.  -- She may be slightly volume overloaded. CT with bilateral pleural effusions. BNP slightly elevated and having some PND. -- Given some IV Lasix in the ED which she thinks may have helped her SOB. Will give her one more dose IV Lasix to see if breathing improves. Watch creat carefully. -- Otherwise no signs of volume overload on exam  Anemia: Hgb is  10.4 (down from 12.7 3 months ago and 11.9 1 month ago). She has been on DAPT with ASA + Brilinta for recently placed DES < 1 year ago. She denies melena. -- FOBT pending   CAD: S/P post STEMI in February 2015 with stenting of the LAD and RCA. Her recent chest pain is atypical for angina. However, we'll continue to cycle cardiac enzymes.  -- Continue medical therapy: DAPT with aspirin plus Brilinta, Metroprolol, lisinopril and atorvastatin.  HLD: continue statin    Hypokalemia- mild. Will supplement.   Judy Pimple PA-C  Pager [phone]   Patient seen and examined Agree with findings of Kathlene November  Continue treatment for pericarditis.  Echo with moderate effusion that will need to be followed. Agreee with diuresis  May help with symptoms.   ANA pending, but prob viral.    Dorris Carnes

## 2014-10-13 LAB — BASIC METABOLIC PANEL
Anion gap: 14 (ref 5–15)
BUN: 21 mg/dL (ref 6–23)
CHLORIDE: 98 meq/L (ref 96–112)
CO2: 26 mEq/L (ref 19–32)
Calcium: 9.2 mg/dL (ref 8.4–10.5)
Creatinine, Ser: 1.5 mg/dL — ABNORMAL HIGH (ref 0.50–1.10)
GFR calc non Af Amer: 33 mL/min — ABNORMAL LOW (ref >90)
GFR, EST AFRICAN AMERICAN: 38 mL/min — AB (ref >90)
GLUCOSE: 207 mg/dL — AB (ref 70–99)
POTASSIUM: 4.2 meq/L (ref 3.7–5.3)
Sodium: 138 mEq/L (ref 137–147)

## 2014-10-13 LAB — CBC
HEMATOCRIT: 33.9 % — AB (ref 36.0–46.0)
HEMOGLOBIN: 10.9 g/dL — AB (ref 12.0–15.0)
MCH: 27.7 pg (ref 26.0–34.0)
MCHC: 32.2 g/dL (ref 30.0–36.0)
MCV: 86 fL (ref 78.0–100.0)
Platelets: 456 10*3/uL — ABNORMAL HIGH (ref 150–400)
RBC: 3.94 MIL/uL (ref 3.87–5.11)
RDW: 14.6 % (ref 11.5–15.5)
WBC: 6.8 10*3/uL (ref 4.0–10.5)

## 2014-10-13 LAB — GLUCOSE, CAPILLARY
GLUCOSE-CAPILLARY: 124 mg/dL — AB (ref 70–99)
GLUCOSE-CAPILLARY: 173 mg/dL — AB (ref 70–99)
Glucose-Capillary: 150 mg/dL — ABNORMAL HIGH (ref 70–99)
Glucose-Capillary: 161 mg/dL — ABNORMAL HIGH (ref 70–99)

## 2014-10-13 MED ORDER — COLCHICINE 0.6 MG PO TABS
0.6000 mg | ORAL_TABLET | Freq: Every day | ORAL | Status: DC
  Filled 2014-10-13 (×2): qty 1

## 2014-10-13 MED ORDER — LOPERAMIDE HCL 2 MG PO CAPS
2.0000 mg | ORAL_CAPSULE | ORAL | Status: DC | PRN
  Administered 2014-10-13 (×3): 2 mg via ORAL
  Filled 2014-10-13 (×3): qty 1

## 2014-10-13 NOTE — Progress Notes (Signed)
Patient Name: Elizabeth Aguilar Date of Encounter: 10/13/2014     Principal Problem:   Pericarditis Active Problems:   Hyperlipemia   Essential hypertension, benign   Diabetes type 2, controlled   STEMI 12/16/13- LAD DES   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   Atypical chest pain   Chest pain    SUBJECTIVE  No chest pain or SOB. However, now having significant diarrhea.   CURRENT MEDS . aspirin EC  81 mg Oral Daily  . atorvastatin  40 mg Oral q1800  . colchicine  0.6 mg Oral BID  . hydrochlorothiazide  12.5 mg Oral Daily  . lisinopril  2.5 mg Oral Daily  . metoprolol succinate  25 mg Oral QHS  . metoprolol succinate  50 mg Oral Daily  . pantoprazole  80 mg Oral Q1200  . ticagrelor  90 mg Oral BID    OBJECTIVE  Filed Vitals:   10/12/14 0527 10/12/14 1509 10/12/14 2117 10/13/14 0529  BP: 123/59 101/63 105/52 101/41  Pulse: 95 106 108 95  Temp: 98.2 F (36.8 C) 98 F (36.7 C) 98.3 F (36.8 C) 97.9 F (36.6 C)  TempSrc: Oral Oral Oral Oral  Resp: 18 18    Height:      Weight: 138 lb 14.4 oz (63.005 kg)   135 lb 6.4 oz (61.417 kg)  SpO2: 95% 99% 94% 94%    Intake/Output Summary (Last 24 hours) at 10/13/14 0855 Last data filed at 10/13/14 0533  Gross per 24 hour  Intake   1200 ml  Output   1550 ml  Net   -350 ml   Filed Weights   10/11/14 0535 10/12/14 0527 10/13/14 0529  Weight: 141 lb 12.8 oz (64.32 kg) 138 lb 14.4 oz (63.005 kg) 135 lb 6.4 oz (61.417 kg)    PHYSICAL EXAM  General: Pleasant, NAD. Neuro: Alert and oriented X 3. Moves all extremities spontaneously. Psych: Normal affect. HEENT:  Normal  Neck: Supple without bruits or JVD. Lungs:  Resp regular and unlabored, CTA. Heart: tachy. no s3, s4, or murmurs. Abdomen: Soft, non-tender, non-distended, BS + x 4.  Extremities: No clubbing, cyanosis or edema. DP/PT/Radials 2+ and equal bilaterally.  Accessory Clinical Findings  CBC  Recent Labs  10/10/14 1023  10/12/14 0421 10/13/14 0437  WBC 10.0  <  > 7.6 6.8  NEUTROABS 7.9*  --   --   --   HGB 10.1*  < > 10.4* 10.9*  HCT 31.1*  < > 31.3* 33.9*  MCV 87.6  < > 87.7 86.0  PLT 258  < > 366 456*  < > = values in this interval not displayed. Basic Metabolic Panel  Recent Labs  10/11/14 0321 10/12/14 0421  NA 139 142  K 3.4* 3.6*  CL 99 102  CO2 26 26  GLUCOSE 130* 121*  BUN 16 19  CREATININE 1.17* 1.29*  CALCIUM 8.6 8.6   Liver Function Tests  Recent Labs  10/10/14 1023  AST 11  ALT 11  ALKPHOS 114  BILITOT 0.6  PROT 6.0  ALBUMIN 2.4*    Cardiac Enzymes  Recent Labs  10/10/14 1023  TROPONINI <0.30   BNP Invalid input(s): POCBNP D-Dimer  Recent Labs  10/10/14 1023  DDIMER 2.34*    TELE  Sinus tach with some PVCS  Radiology/Studies  Dg Chest 2 View  10/10/2014   CLINICAL DATA:  Chills, headache and body aches.  EXAM: CHEST  2 VIEW  COMPARISON:  PA and lateral chest and  CT chest 08/12/2014.  FINDINGS: The patient has a new, small bilateral pleural effusions with minimal basilar atelectasis. The lungs are otherwise clear. There is cardiomegaly but no pulmonary edema.  IMPRESSION: Small bilateral pleural effusions and minimal basilar atelectasis.  Cardiomegaly without edema.   Electronically Signed   By: Inge Rise M.D.   On: 10/10/2014 11:00   Ct Angio Chest Pe W/cm &/or Wo Cm  10/10/2014   CLINICAL DATA:  Shortness of breath, chest pain with exertion.  EXAM: CT ANGIOGRAPHY CHEST WITH CONTRAST  TECHNIQUE: Multidetector CT imaging of the chest was performed using the standard protocol during bolus administration of intravenous contrast. Multiplanar CT image reconstructions and MIPs were obtained to evaluate the vascular anatomy.  CONTRAST:  131m OMNIPAQUE IOHEXOL 350 MG/ML SOLN  COMPARISON:  08/12/2014  FINDINGS: The pulmonary arteries are adequately opacified. There is no evidence of pulmonary embolism. New small bilateral pleural effusions and small pericardial effusion identified. Pulmonary venous  hypertensive changes present without overt airspace edema. Stable appearance of a coronary stent in the distribution of the LAD. The thoracic aorta is of normal caliber.  No masses or enlarged lymph nodes are seen. The visualized upper abdomen is unremarkable. No significant bony abnormalities.  Review of the MIP images confirms the above findings.  IMPRESSION: 1. No evidence of pulmonary embolism. 2. New small bilateral pleural effusions and small pericardial effusion suggesting fluid overload. Pulmonary venous hypertensive changes without overt pulmonary edema.   Electronically Signed   By: GAletta EdouardM.D.   On: 10/10/2014 16:43    2D ECHO: 10/11/2014 LV EF: 45% -  50% Study Conclusions - Left ventricle: The cavity size was normal. Wall thickness was normal. Systolic function was mildly reduced. The estimated ejection fraction was in the range of 45% to 50%. Regional wall motion abnormalities cannot be excluded. Doppler parameters are consistent with abnormal left ventricular relaxation (grade 1 diastolic dysfunction). - Pericardium, extracardiac: A moderate, free-flowing pericardial effusion was identified circumferential to the heart. The fluid had no internal echoes.There was no evidence of hemodynamic compromise.   ASSESSMENT AND PLAN  Elizabeth PASCALEis a 73y.o. female with a history of CAD s/p STEMI s/p DES to LAD and RCA (12/2013), HLD, diabetes, ischemic CM ( EF 25%--> 40%) [facility].    Pericarditis Pain is improved    CRP signif elevated (29.7). ESR (83). -- BNP elevated at 1454. ANA negative. Probably viral.   -- 2D ECHO yesterday with EF 45-50%, G1DD, moderate, free-flowing pericardial effusion identified circumferential to the heart. No signs of HD compromise. This will need to be monitored -- Continue colchicine and pain meds. Reluctant to use NSAIDS with DAPT and anemia  -- Having diarrhea with colchicine. Will  hold now  Try 1x per day   She admits to loose stools when on colchicine for gout. May need to switch to prednisone but concern for relapse    Acute on chronic systolic CHF/Ischemic CM-  2D ECHO yesterday with EF 45-50%, G1DD, moderate, free-flowing pericardial effusion identified circumferential to the heart. No signs of HD compromise. Breathing improved.  Would hold on further lasix esp with diarrhea  Anemia: Hgb is 10.9 (down from 12.7 3 months ago and 11.9 1 month ago). She has been on DAPT with ASA + Brilinta for recently placed DES < 1 year ago. She denies melena.  CAD: S/P post STEMI in February 2015 with stenting of the LAD and RCA. Her recent  chest pain is atypical for angina. However, we'll continue to cycle cardiac enzymes.  -- Continue medical therapy: DAPT with aspirin plus Brilinta, Metroprolol, lisinopril and atorvastatin.  HLD: continue statin     Signed, Eileen Stanford PA-C  Pager [phone]  Patient seen and examined  Agree with findings of K [place] Silos above I have amended note to reflect my findings.   Will follw diarrhea  Hope she will tolerate qd colchicine.  Dorris Carnes

## 2014-10-13 NOTE — Plan of Care (Signed)
Problem: Phase I Progression Outcomes Goal: Aspirin unless contraindicated Outcome: Completed/Met Date Met:  10/13/14 Goal: MD aware of Cardiac Marker results Outcome: Completed/Met Date Met:  10/13/14  Problem: Phase II Progression Outcomes Goal: Hemodynamically stable Outcome: Completed/Met Date Met:  10/13/14 Goal: Anginal pain relieved Outcome: Completed/Met Date Met:  10/13/14

## 2014-10-13 NOTE — Progress Notes (Signed)
Inpatient Diabetes Program Recommendations  AACE/ADA: New Consensus Statement on Inpatient Glycemic Control (2013)  Target Ranges:  Prepandial:   less than 140 mg/dL      Peak postprandial:   less than 180 mg/dL (1-2 hours)      Critically ill patients:  140 - 180 mg/dL   Inpatient Diabetes Program Recommendations Correction (SSI): Please add the sensitive correction scale tidwc, as post-prandial cbg's are elevated into 200's  Thank you, Rosita Kea, RN, CNS, Diabetes Coordinator [phone])

## 2014-10-14 ENCOUNTER — Encounter (HOSPITAL_COMMUNITY): Payer: Self-pay | Admitting: Physician Assistant

## 2014-10-14 DIAGNOSIS — I5042 Chronic combined systolic (congestive) and diastolic (congestive) heart failure: Secondary | ICD-10-CM | POA: Diagnosis present

## 2014-10-14 LAB — GLUCOSE, CAPILLARY
GLUCOSE-CAPILLARY: 172 mg/dL — AB (ref 70–99)
Glucose-Capillary: 159 mg/dL — ABNORMAL HIGH (ref 70–99)

## 2014-10-14 NOTE — Progress Notes (Signed)
 Patient Name: Elizabeth Aguilar Date of Encounter: 10/14/2014     Principal Problem:   Pericarditis Active Problems:   Hyperlipemia   Essential hypertension, benign   Diabetes type 2, controlled   STEMI 12/16/13- LAD DES   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   Atypical chest pain   Chest pain    SUBJECTIVE  No chest pain or SOB. Had significant diarrhea, but this resolved last night. Now feeling a little nauseated after breakfast. Talked with her about possible discharge today but she is very worried about the chest pain coming back.   CURRENT MEDS . aspirin EC  81 mg Oral Daily  . atorvastatin  40 mg Oral q1800  . colchicine  0.6 mg Oral Daily  . hydrochlorothiazide  12.5 mg Oral Daily  . lisinopril  2.5 mg Oral Daily  . metoprolol succinate  25 mg Oral QHS  . metoprolol succinate  50 mg Oral Daily  . pantoprazole  80 mg Oral Q1200  . ticagrelor  90 mg Oral BID    OBJECTIVE  Filed Vitals:   10/13/14 0529 10/13/14 1351 10/13/14 2132 10/14/14 0547  BP: 101/41 99/50 111/49 115/63  Pulse: 95 96 96 100  Temp: 97.9 F (36.6 C) 98 F (36.7 C) 98.2 F (36.8 C) 98.5 F (36.9 C)  TempSrc: Oral Oral Oral Oral  Resp:      Height:      Weight: 135 lb 6.4 oz (61.417 kg)   133 lb 8 oz (60.555 kg)  SpO2: 94% 96% 96% 98%    Intake/Output Summary (Last 24 hours) at 10/14/14 0803 Last data filed at 10/13/14 2200  Gross per 24 hour  Intake   1080 ml  Output      0 ml  Net   1080 ml   Filed Weights   10/12/14 0527 10/13/14 0529 10/14/14 0547  Weight: 138 lb 14.4 oz (63.005 kg) 135 lb 6.4 oz (61.417 kg) 133 lb 8 oz (60.555 kg)    PHYSICAL EXAM  General: Pleasant, NAD. Neuro: Alert and oriented X 3. Moves all extremities spontaneously. Psych: Normal affect. HEENT:  Normal  Neck: Supple without bruits or JVD. Lungs:  Resp regular and unlabored, CTA. Heart: tachy. no s3, s4, or murmurs. Abdomen: Soft, non-tender, non-distended, BS + x 4.  Extremities: No clubbing, cyanosis or  edema. DP/PT/Radials 2+ and equal bilaterally.  Accessory Clinical Findings  CBC  Recent Labs  10/12/14 0421 10/13/14 0437  WBC 7.6 6.8  HGB 10.4* 10.9*  HCT 31.3* 33.9*  MCV 87.7 86.0  PLT 366 456*   Basic Metabolic Panel  Recent Labs  10/12/14 0421 10/13/14 1046  NA 142 138  K 3.6* 4.2  CL 102 98  CO2 26 26  GLUCOSE 121* 207*  BUN 19 21  CREATININE 1.29* 1.50*  CALCIUM 8.6 9.2    TELE  Sinus tach with some PVCS  Radiology/Studies  Dg Chest 2 View  10/10/2014   CLINICAL DATA:  Chills, headache and body aches.  EXAM: CHEST  2 VIEW  COMPARISON:  PA and lateral chest and CT chest 08/12/2014.  FINDINGS: The patient has a new, small bilateral pleural effusions with minimal basilar atelectasis. The lungs are otherwise clear. There is cardiomegaly but no pulmonary edema.  IMPRESSION: Small bilateral pleural effusions and minimal basilar atelectasis.  Cardiomegaly without edema.   Electronically Signed   By: Thomas  Dalessio M.D.   On: 10/10/2014 11:00   Ct Angio Chest Pe W/cm &/or Wo Cm    10/10/2014   CLINICAL DATA:  Shortness of breath, chest pain with exertion.  EXAM: CT ANGIOGRAPHY CHEST WITH CONTRAST  TECHNIQUE: Multidetector CT imaging of the chest was performed using the standard protocol during bolus administration of intravenous contrast. Multiplanar CT image reconstructions and MIPs were obtained to evaluate the vascular anatomy.  CONTRAST:  169m OMNIPAQUE IOHEXOL 350 MG/ML SOLN  COMPARISON:  08/12/2014  FINDINGS: The pulmonary arteries are adequately opacified. There is no evidence of pulmonary embolism. New small bilateral pleural effusions and small pericardial effusion identified. Pulmonary venous hypertensive changes present without overt airspace edema. Stable appearance of a coronary stent in the distribution of the LAD. The thoracic aorta is of normal caliber.  No masses or enlarged lymph nodes are seen. The visualized upper abdomen is unremarkable. No  significant bony abnormalities.  Review of the MIP images confirms the above findings.  IMPRESSION: 1. No evidence of pulmonary embolism. 2. New small bilateral pleural effusions and small pericardial effusion suggesting fluid overload. Pulmonary venous hypertensive changes without overt pulmonary edema.   Electronically Signed   By: GAletta EdouardM.D.   On: 10/10/2014 16:43    2D ECHO: 10/11/2014 LV EF: 45% -  50% Study Conclusions - Left ventricle: The cavity size was normal. Wall thickness was normal. Systolic function was mildly reduced. The estimated ejection fraction was in the range of 45% to 50%. Regional wall motion abnormalities cannot be excluded. Doppler parameters are consistent with abnormal left ventricular relaxation (grade 1 diastolic dysfunction). - Pericardium, extracardiac: A moderate, free-flowing pericardial effusion was identified circumferential to the heart. The fluid had no internal echoes.There was no evidence of hemodynamic compromise.   ASSESSMENT AND PLAN  MANNEKE CUNDYis a 73y.o. female with a history of CAD s/p STEMI s/p DES to LAD and RCA (12/2013), HLD, diabetes, ischemic CM ( EF 25%--> 40%) [facility].    Pericarditis Pain is improved    CRP signif elevated (29.7). ESR (83). -- BNP elevated at 1454. ANA negative. Probably viral.   -- 2D ECHO yesterday with EF 45-50%, G1DD, moderate, free-flowing pericardial effusion identified circumferential to the heart. No signs of HD compromise. This will need to be monitored -- Continue colchicine and pain meds. Reluctant to use NSAIDS with DAPT and anemia  -- Had diarrhea with colchicine, so it was reduced to 1x per day. Had one more episode diarrhea   --  May need to switch to prednisone but concern for relapse    Acute on chronic systolic CHF/Ischemic CM-  2D ECHO yesterday with EF 45-50%, G1DD, moderate, free-flowing pericardial effusion identified  circumferential to the heart. No signs of HD compromise. -- Breathing improved.  Would hold on further lasix esp with diarrhea  Anemia: Hgb is 10.9 (down from 12.7 3 months ago and 11.9 1 month ago). She has been on DAPT with ASA + Brilinta for recently placed DES < 1 year ago. She denies melena.  CAD: S/P post STEMI in February 2015 with stenting of the LAD and RCA. Her recent chest pain is atypical for angina. However, we'll continue to cycle cardiac enzymes.  -- Continue medical therapy: DAPT with aspirin plus Brilinta, Metroprolol, lisinopril and atorvastatin.  HLD: continue statin    DM- sensitive SS added per diabetes coordinator recommendation  Signed, TEileen StanfordPA-C  Pager [phone]  Patient seen and examined  I agee with findings of K TGrandville Silosabove   Pateint denies CP  Still  with diarrhea.  May be slowing Hold colchicine  No steroids since no CP  No ASA or indocin with DAPT  ESR and CRP are very high  CT neg (should show miliary TB)  ANA negative  WBC normal WIll follow   Patient may be able to go home later if bowels improve   Do not want her to get dehydrated. Close outpatinet f/u.  Dorris Carnes

## 2014-10-14 NOTE — Progress Notes (Signed)
Medicare Important Message given? YES   (If response is "NO", the following Medicare IM given date fields will be blank)   Date Medicare IM given:   Medicare IM given by: Graves-Bigelow, Kawika Bischoff  

## 2014-10-14 NOTE — Discharge Summary (Signed)
Discharge Summary   Patient ID: Elizabeth Aguilar MRN: [id number], DOB/AGE: 74-Nov-1942 74 y.o. Admit date: 10/10/2014 D/C date:     10/14/2014  Primary Cardiologist: Dr. 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   HPI: Elizabeth Aguilar is a 73 y.o. female with a history of CAD s/p STEMI s/p DES to LAD and RCA (12/2013), HLD, diabetes, ischemic CM ( EF 25%--> 40%) who presented to [facility] on 10/11/14 with chest pain.   She suffered an STEMI in 12/16/2013 and underwent urgent catheterization and stenting of her LAD and staged RCA stenting. EF at that time was 25%. She was sent home with a LifeVest and placed on appropriate medical therapy. Repeat 2D echo on 03/03/14 demonstrated improved systolic function with EF at 40%. Her LifeVest was discontinued. Her last OV with Dr. Claiborne Billings was 07/19/14 and she was felt to be stable from a cardiac standpoint. Dr. Claiborne Billings advised that she follow-up in 6 months.  She presented to the [facility] ED on 10/10/14 with complaints of chest pain and dyspnea. D-dimer in ED was elevated at 2.34 but CT of chest negative for PE. The CT scan did show new small bilateral pleural effusions and small pericardial effusion, suggestive of fluid overload. CXR also demonstrated small bilateral pleural effusions and minimal basilar atelectasis as well as cardiomegaly without edema. BNP elevated at 1084. SCr is 1.10. Troponin is negative x 1. Her EKG demonstrated sinus tachycardia with heart rate at 109 bpm. She was noted to have low voltage QRS complexes in the precordial leads and nonspecific T-wave abnormalities in the anterior lateral leads. She appears to have slight PR depressions as well.    The patient reported sharp pleuritic chest pain and mild occasional dyspnea for the past 6 weeks. She cannot recall the exact symptoms she experienced at the time of her MI in February of this year, however she feels that her recent symptoms are different. She has been seen multiple times at the [facility] Emergency Department and workup has been unremarkable. She does note that she also recently had a gout flare several weeks ago which was treated with prednisone. During that time, her chest pain resolved however reemerged after discontinuation of prednisone. In addition to pleuritic chest pain, she also noted chest pain with palpation of the chest wall. It is not worsened by positional changes or exertion. She denies orthopnea, PND or lower extremity edema. No recent viral illnesses. She reports full medication compliance, including daily compliance with dual antiplatelet therapy (aspirin plus Brilinta).  Hospital Course  Pericarditis-- pain is improved.   -- CRP (29.7). ESR (83). ESR and CRP are very high. 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 WBC normal -- BNP elevated at 1454. ANA negative.  -- 2D ECHO 10/11/14 with EF 45-50%, G1DD, moderate, free-flowing pericardial effusion identified circumferential to the heart. No signs of HD compromise. -- Reluctant to use NSAIDS with DAPT and anemia. Placed on colchicine which improved her chest pain but caused her diarrhea. This has slowed down since the medication has been discontinued. Will discontinue completely.  -- Will not proceed  with steroids as she is now chest pain free and this has not been shown to improve outcomes.   Acute on chronic systolic CHF/Ischemic CM- 2D ECHO yesterday with EF 45-50%, G1DD, moderate, free-flowing pericardial effusion identified circumferential to the heart. No signs of HD compromise. -- She was given 2 doses of IV lasix for SOB.  Breathing improved.Would hold on further lasix esp with diarrhea -- Continue BB and ACE  Anemia: Hgb is 10.9 (down from 12.7 3 months ago and 11.9 1 month ago). She has been on DAPT with ASA + Brilinta for recently placed DES < 1 year ago. She denies melena.  CAD: S/P post STEMI in February 2015 with stenting of the LAD and RCA.  -- No objective signs of ischemia -- Continue medical therapy: DAPT with aspirin plus Brilinta, Metroprolol, lisinopril and atorvastatin.  Elevated D-dimer- CTA neg for PE  HLD: continue statin   DM- continue home meds  The patient has had an uncomplicated hospital course and is recovering well.  She has been seen by Dr. Harrington Challenger today and deemed ready for discharge home. All follow-up appointments have been scheduled. Discharge medications are listed below.   Discharge Vitals: Blood pressure 115/63, pulse 100, temperature 98.5 F (36.9 C), temperature source Oral, resp. rate 18, height '5\' 5"'  (1.651 m), weight 133 lb 8 oz (60.555 kg), SpO2 98 %.  Labs: Lab Results  Component Value Date   WBC 6.8 10/13/2014   HGB 10.9* 10/13/2014   HCT 33.9* 10/13/2014   MCV 86.0 10/13/2014   PLT 456* 10/13/2014    Recent Labs Lab 10/10/14 1023  10/13/14 1046  NA 141  < > 138  K 3.7  < > 4.2  CL 102  < > 98  CO2 25  < > 26  BUN 18  < > 21  CREATININE 1.10  < > 1.50*  CALCIUM 8.4  < > 9.2  PROT 6.0  --   --   BILITOT 0.6  --   --   ALKPHOS 114  --   --   ALT 11  --   --   AST 11  --   --   GLUCOSE 153*  < > 207*  < > = values in this interval not displayed.  Lab Results  Component Value Date   CHOL 123 12/17/2013   HDL 39* 12/17/2013   LDLCALC 63 12/17/2013   TRIG 106 12/17/2013   Lab Results  Component Value Date   DDIMER 2.34* 10/10/2014    Diagnostic Studies/Procedures   Dg Chest 2 View  10/10/2014   CLINICAL DATA:  Chills, headache and body aches.  EXAM: CHEST  2 VIEW  COMPARISON:  PA and lateral chest and CT chest 08/12/2014.  FINDINGS: The  patient has a new, small bilateral pleural effusions with minimal basilar atelectasis. The lungs are otherwise clear. There is cardiomegaly but no pulmonary edema.  IMPRESSION: Small bilateral pleural effusions and minimal basilar atelectasis.  Cardiomegaly without edema.   Electronically Signed   By: Inge Rise M.D.   On: 10/10/2014 11:00   Ct Angio Chest Pe W/cm &/or Wo Cm  10/10/2014   CLINICAL DATA:  Shortness of breath, chest pain with exertion.  EXAM: CT ANGIOGRAPHY CHEST WITH CONTRAST  TECHNIQUE: Multidetector CT imaging of the chest was performed using the standard protocol during bolus administration of intravenous contrast. Multiplanar CT image reconstructions and MIPs were obtained to evaluate the vascular anatomy.  CONTRAST:  140m OMNIPAQUE IOHEXOL 350 MG/ML  SOLN  COMPARISON:  08/12/2014  FINDINGS: The pulmonary arteries are adequately opacified. There is no evidence of pulmonary embolism. New small bilateral pleural effusions and small pericardial effusion identified. Pulmonary venous hypertensive changes present without overt airspace edema. Stable appearance of a coronary stent in the distribution of the LAD. The thoracic aorta is of normal caliber.  No masses or enlarged lymph nodes are seen. The visualized upper abdomen is unremarkable. No significant bony abnormalities.  Review of the MIP images confirms the above findings.  IMPRESSION: 1. No evidence of pulmonary embolism. 2. New small bilateral pleural effusions and small pericardial effusion suggesting fluid overload. Pulmonary venous hypertensive changes without overt pulmonary edema.   Electronically Signed   By: Aletta Edouard M.D.   On: 10/10/2014 16:43    2D ECHO: 10/11/2014 LV EF: 45% -  50% Study Conclusions - Left ventricle: The cavity size was normal. Wall thickness was normal. Systolic function was mildly reduced. The estimated ejection fraction was in the range of 45% to 50%. Regional wall motion  abnormalities cannot be excluded. Doppler parameters are consistent with abnormal left ventricular relaxation (grade 1 diastolic dysfunction). - Pericardium, extracardiac: A moderate, free-flowing pericardial effusion was identified circumferential to the heart. The fluid had no internal echoes.There was no evidence of hemodynamic compromise.   Discharge Medications     Medication List    STOP taking these medications        colchicine 0.6 MG tablet     predniSONE 20 MG tablet  Commonly known as:  DELTASONE      TAKE these medications        alendronate 70 MG tablet  Commonly known as:  FOSAMAX  Take 1 tablet (70 mg total) by mouth once a week. Take with a full glass of water on an empty stomach.     ALPRAZolam 0.5 MG tablet  Commonly known as:  XANAX  TAKE ONE TABLET TWICE DAILY AS NEEDED     aspirin 81 MG EC tablet  Take 1 tablet (81 mg total) by mouth daily.     atorvastatin 40 MG tablet  Commonly known as:  LIPITOR  Take 1 tablet (40 mg total) by mouth daily at 6 PM.     diphenoxylate-atropine 2.5-0.025 MG per tablet  Commonly known as:  LOMOTIL  Take 1 tablet by mouth 3 (three) times daily as needed for diarrhea or loose stools.     esomeprazole 40 MG capsule  Commonly known as:  NEXIUM  Take 1 capsule (40 mg total) by mouth every morning.     hydrochlorothiazide 25 MG tablet  Commonly known as:  HYDRODIURIL  Take 0.5 tablets (12.5 mg total) by mouth daily.     HYDROcodone-acetaminophen 5-325 MG per tablet  Commonly known as:  NORCO/VICODIN  Take 1 tablet po 4 times daily prn     ketorolac 0.5 % ophthalmic solution  Commonly known as:  ACULAR  Place 1 drop into both eyes 3 (three) times daily.     lisinopril 2.5 MG tablet  Commonly known as:  PRINIVIL,ZESTRIL  Take 1 tablet (2.5 mg total) by mouth daily.     loratadine 10 MG tablet  Commonly known as:  CLARITIN  Take 1 tablet (10 mg total) by mouth daily.     metFORMIN 500 MG tablet    Commonly known as:  GLUCOPHAGE  Take 1 tablet (500 mg total) by mouth 2 (two) times daily with a meal.     metoprolol succinate 50 MG 24  hr tablet  Commonly known as:  TOPROL-XL  Take 25-50 mg by mouth 2 (two) times daily. Takes one whole tablet in the morning and takes one-half tablet at bedtimeTake with or immediately following a meal.     multivitamin tablet  Take 1 tablet by mouth daily.     nitroGLYCERIN 0.4 MG SL tablet  Commonly known as:  NITROSTAT  Place 1 tablet (0.4 mg total) under the tongue every 5 (five) minutes x 3 doses as needed for chest pain.     ondansetron 8 MG disintegrating tablet  Commonly known as:  ZOFRAN-ODT  Take 1 tablet (8 mg total) by mouth every 8 (eight) hours as needed for nausea or vomiting.     ticagrelor 90 MG Tabs tablet  Commonly known as:  BRILINTA  Take 1 tablet (90 mg total) by mouth 2 (two) times daily.     vitamin C 500 MG tablet  Commonly known as:  ASCORBIC ACID  Take 500 mg by mouth daily.        Disposition   The patient will be discharged in stable condition to home.  Follow-up Information    Follow up with Lyda Jester, PA-C On 10/27/2014.   Specialty:  Cardiology   Why:  @ 9 am   Contact information:   [address] [place] [address] [phone]         Duration of Discharge Encounter: Greater than 30 minutes including physician and PA time.  Mable Fill R PA-C 10/14/2014, 4:05 PM

## 2014-10-15 ENCOUNTER — Other Ambulatory Visit: Payer: Self-pay | Admitting: Family Medicine

## 2014-10-20 ENCOUNTER — Encounter (HOSPITAL_COMMUNITY): Payer: Self-pay | Admitting: Cardiovascular Disease

## 2014-10-27 ENCOUNTER — Ambulatory Visit (INDEPENDENT_AMBULATORY_CARE_PROVIDER_SITE_OTHER): Payer: Medicare Other | Admitting: Cardiology

## 2014-10-27 ENCOUNTER — Encounter: Payer: Self-pay | Admitting: Cardiology

## 2014-10-27 VITALS — BP 124/62 | HR 78 | Ht 64.0 in | Wt 141.3 lb

## 2014-10-27 DIAGNOSIS — I255 Ischemic cardiomyopathy: Secondary | ICD-10-CM | POA: Diagnosis not present

## 2014-10-27 DIAGNOSIS — I319 Disease of pericardium, unspecified: Secondary | ICD-10-CM

## 2014-10-27 NOTE — Patient Instructions (Signed)
Please remember to ask your primary care physician about:   Allopurinol for treatment of gout  Keep your future appointment with Dr. Claiborne Billings.

## 2014-10-27 NOTE — Progress Notes (Signed)
10/27/2014 Elizabeth Aguilar   1941/09/29  [id number]  Primary Physician Sallee Lange, MD Primary Cardiologist: Dr. Claiborne Billings  The patient presents to clinic for post hospital follow-up. Details regarding her medical history and recent hospital course are outlined in detail below.  HPI:  Elizabeth Aguilar is a 73 y.o. female with a history of CAD s/p STEMI s/p DES to LAD and RCA (12/2013), HLD, diabetes, ischemic CM ( EF 25%--> 40%) who presented to [facility] on 10/11/14 with chest pain.   She suffered a STEMI in 12/16/2013 and underwent urgent catheterization and stenting of her LAD and staged RCA stenting. EF at that time was 25%. She was sent home with a LifeVest and placed on appropriate medical therapy. Repeat 2D echo on 03/03/14 demonstrated improved systolic function with EF at 40%. Her LifeVest was discontinued. Her last OV with Dr. Claiborne Billings was 07/19/14 and she was felt to be stable from a cardiac standpoint. Dr. Claiborne Billings advised that she follow-up in 6 months.  She presented to the [facility] ED on 10/10/14 with complaints of chest pain and dyspnea. D-dimer in ED was elevated at 2.34 but CT of chest negative for PE. The CT scan did show new small bilateral pleural effusions and small pericardial effusion, suggestive of fluid overload. CXR also demonstrated small bilateral pleural effusions and minimal basilar atelectasis as well as cardiomegaly without edema. BNP elevated at 1084. Cardiac enzymes were cycled and were negative x3. Her EKG demonstrated sinus tachycardia with heart rate at 109 bpm. She was noted to have low voltage QRS complexes in the precordial leads and nonspecific T-wave abnormalities in the anterior lateral leads. She appeared to have slight PR depressions as well.  The patient reported sharp pleuritic chest pain and mild occasional dyspnea x 6 weeks. The pain was atypical from her previous angina. She did note that she also recently had a gout flare several weeks prior which was treated with prednisone.  During that time, her chest pain resolved however reemerged after discontinuation of prednisone. In addition to pleuritic chest pain, she also noted chest pain with palpation of the chest wall. It was not worsened by positional changes or exertion. She denied orthopnea, PND and lower extremity edema. No recent viral illnesses. She reported full medication compliance, including daily compliance with dual antiplatelet therapy (aspirin plus Brilinta).  Given the characteristics of her chest pain and EKG findings. Pericarditis was suspected. Inflammatory markers including C-reactive protein and erythrocyte sedimentation rate were checked. Both were remarkably elevated at 29.7 and 83 respectively. A 2-D echocardiogram was obtained which revealed mildly reduced systolic function with an ejection fraction of 45-50%. Examanation of the pericardium revealed a moderate free-flowing pericardial effusion circumflex ventral to the heart. However, there was no evidence of hemodynamic compromise. Given that she was on dual antiplatelet therapy, it was felt best to avoid NSAID therapy. She was placed on colchicine and her symptoms improved however she developed diarrhea and colchicine was eventually discontinued. It was also felt that she had mild CHF exacerbation and was treated with several doses of IV Lasix. This also improved her symptoms and she was discharged home.  She reports  to clinic today for post-hospital follow-up. Since discharge, she states that she has been doing well. She denies any recurrent pleuritic chest pain. No dyspnea, weight gain, orthopnea, PND or lower extremity edema. Her diarrhea has resolved. Her acute gout flare has also resolved. She reports full medication compliance and adherence to a low sodium diet.    Current  Outpatient Prescriptions  Medication Sig Dispense Refill  . alendronate (FOSAMAX) 70 MG tablet Take 1 tablet (70 mg total) by mouth once a week. Take with a full glass of water  on an empty stomach. (Patient taking differently: Take 70 mg by mouth once a week. Take with a full glass of water on an empty stomach.(wednesday)) 4 tablet 12  . ALPRAZolam (XANAX) 0.5 MG tablet TAKE ONE TABLET TWICE DAILY AS NEEDED 60 tablet 4  . aspirin EC 81 MG EC tablet Take 1 tablet (81 mg total) by mouth daily.    [name] atorvastatin (LIPITOR) 40 MG tablet Take 1 tablet (40 mg total) by mouth daily at 6 PM. 30 tablet 12  . diphenoxylate-atropine (LOMOTIL) 2.5-0.025 MG per tablet Take 1 tablet by mouth 3 (three) times daily as needed for diarrhea or loose stools. 20 tablet 0  . esomeprazole (NEXIUM) 40 MG capsule Take 1 capsule (40 mg total) by mouth every morning. 30 capsule 12  . fluticasone (FLONASE) 50 MCG/ACT nasal spray USE 2 SPRAYS IN EACH NOSTRIL DAILY 16 g 0  . hydrochlorothiazide (HYDRODIURIL) 25 MG tablet Take 0.5 tablets (12.5 mg total) by mouth daily. 15 tablet 12  . HYDROcodone-acetaminophen (NORCO/VICODIN) 5-325 MG per tablet Take 1 tablet po 4 times daily prn 120 tablet 0  . ketorolac (ACULAR) 0.5 % ophthalmic solution Place 1 drop into both eyes 3 (three) times daily.     [name] lisinopril (PRINIVIL,ZESTRIL) 2.5 MG tablet Take 1 tablet (2.5 mg total) by mouth daily. 30 tablet 5  . loratadine (CLARITIN) 10 MG tablet Take 1 tablet (10 mg total) by mouth daily. 30 tablet 12  . metFORMIN (GLUCOPHAGE) 500 MG tablet Take 1 tablet (500 mg total) by mouth 2 (two) times daily with a meal. 60 tablet 12  . metoprolol succinate (TOPROL-XL) 50 MG 24 hr tablet Take 25-50 mg by mouth 2 (two) times daily. Takes one whole tablet in the morning and takes one-half tablet at bedtimeTake with or immediately following a meal.    . Multiple Vitamin (MULTIVITAMIN) tablet Take 1 tablet by mouth daily.    . nitroGLYCERIN (NITROSTAT) 0.4 MG SL tablet Place 1 tablet (0.4 mg total) under the tongue every 5 (five) minutes x 3 doses as needed for chest pain. 25 tablet 12  . ondansetron (ZOFRAN-ODT) 8 MG disintegrating  tablet Take 1 tablet (8 mg total) by mouth every 8 (eight) hours as needed for nausea or vomiting. 21 tablet 0  . Ticagrelor (BRILINTA) 90 MG TABS tablet Take 1 tablet (90 mg total) by mouth 2 (two) times daily. 60 tablet 11  . vitamin C (ASCORBIC ACID) 500 MG tablet Take 500 mg by mouth daily.     No current facility-administered medications for this visit.    Allergies  Allergen Reactions  . Actos [Pioglitazone] Swelling  . Ceftin [Cefuroxime Axetil] Nausea Only  . Celebrex [Celecoxib]     Sickness  . Darvocet [Propoxyphene N-Acetaminophen] Nausea Only  . Doxycycline Nausea And Vomiting and Other (See Comments)    Bones ache  . Erythromycin Nausea And Vomiting  . Sulfa Antibiotics Nausea And Vomiting and Other (See Comments)    Bones ache   . Vioxx [Rofecoxib]     Sickness   . Zithromax [Azithromycin] Nausea Only  . Zocor [Simvastatin]     Side effects    History   Social History  . Marital Status: Widowed    Spouse Name: N/A    Number of Children: N/A  . Years  of Education: N/A   Occupational History  . Not on file.   Social History Main Topics  . Smoking status: Never Smoker   . Smokeless tobacco: Not on file  . Alcohol Use: No  . Drug Use: No  . Sexual Activity: Not on file   Other Topics Concern  . Not on file   Social History Narrative     Review of Systems: General: negative for chills, fever, night sweats or weight changes.  Cardiovascular: negative for chest pain, dyspnea on exertion, edema, orthopnea, palpitations, paroxysmal nocturnal dyspnea or shortness of breath Dermatological: negative for rash Respiratory: negative for cough or wheezing Urologic: negative for hematuria Abdominal: negative for nausea, vomiting, diarrhea, bright red blood per rectum, melena, or hematemesis Neurologic: negative for visual changes, syncope, or dizziness All other systems reviewed and are otherwise negative except as noted above.    Blood pressure 124/62,  pulse 78, height 5\' 4"  (1.626 m), weight 141 lb 4.8 oz (64.093 kg).  General appearance: alert, cooperative and no distress Neck: no carotid bruit and no JVD Lungs: clear to auscultation bilaterally Heart: regular rate and rhythm, S1, S2 normal, no murmur, click, rub or gallop Extremities: no LEE Pulses: 2+ and symmetric Skin: warm and dry Neurologic: Grossly normal  EKG NSR 78 bpm  ASSESSMENT AND PLAN:   1. Pericarditis: symptoms resolved. No further pleuritic chest pain and no dyspnea.  2. Chronic systolic heart failure: euvolemic on physical exam. No further dyspnea or lower extremity edema. Continue diuretic therapy with hydrochlorothiazide. Continue lisinopril and metoprolol succinate.  3. Diarrhea: resolved with discontinuation of colchicine.  4. Gout: acute flare resolved. It is recommended that she discuss with her PCP treatment with allopurinol for prophylaxis given that she cannot tolerate colchicine therapy therapy if recurrent flare-ups and cannot receive NSAID therapy given dual antiplatelet therapy.  PLAN  Patient appears stable from a cardiovascular standpoint. No change in medical therapy. Keep follow-up appointment with Dr. Claiborne Billings in 01/15/14.  SIMMONS, BRITTAINYPA-C 10/27/2014 9:13 AM

## 2014-10-31 ENCOUNTER — Other Ambulatory Visit: Payer: Self-pay | Admitting: *Deleted

## 2014-10-31 ENCOUNTER — Telehealth: Payer: Self-pay

## 2014-10-31 MED ORDER — ALLOPURINOL 100 MG PO TABS: 100.0000 mg | ORAL_TABLET | Freq: Every day | ORAL | Status: DC

## 2014-10-31 NOTE — Telephone Encounter (Signed)
Allopurinol 100mg  one qd with 6 months worth per Dr. Richardson Landry. Med sent to pharm. Pt notified.

## 2014-10-31 NOTE — Telephone Encounter (Addendum)
Allopurinol 100mg  #30 one po qd with 5 additional refills sent to pharm. Pt notified.

## 2014-10-31 NOTE — Telephone Encounter (Signed)
Pt called stating that she went to her cardiologist on Thurs. Her  Cardiologist told her that she needed to have her pcp perscribe  Her allopurinol for her gout. She is wanting to know if it can be Called in. [facility].

## 2014-11-09 ENCOUNTER — Telehealth: Payer: Self-pay | Admitting: Family Medicine

## 2014-11-09 NOTE — Telephone Encounter (Signed)
Pt is requesting that a prescription for her diabetic shoes be sent over To the pharmacy. Pt was told that one would be sent over when she  Was seen in Nov. Pharmacy is stating that one has not been sent over. [employer]

## 2014-11-09 NOTE — Telephone Encounter (Signed)
Rx done, office visit printed please forward

## 2014-11-09 NOTE — Telephone Encounter (Signed)
Script faxed to [facility]. Patient was notified.

## 2014-11-10 ENCOUNTER — Other Ambulatory Visit: Payer: Self-pay | Admitting: Family Medicine

## 2014-11-28 ENCOUNTER — Emergency Department (HOSPITAL_COMMUNITY): Payer: Medicare Other

## 2014-11-28 ENCOUNTER — Emergency Department (HOSPITAL_COMMUNITY)
Admission: EM | Admit: 2014-11-28 | Discharge: 2014-11-28 | Disposition: A | Discharge status: Home / Self Care | Payer: Medicare Other | Attending: Emergency Medicine | Admitting: Emergency Medicine

## 2014-11-28 ENCOUNTER — Encounter (HOSPITAL_COMMUNITY): Payer: Self-pay | Admitting: Emergency Medicine

## 2014-11-28 DIAGNOSIS — E785 Hyperlipidemia, unspecified: Secondary | ICD-10-CM | POA: Diagnosis not present

## 2014-11-28 DIAGNOSIS — E119 Type 2 diabetes mellitus without complications: Secondary | ICD-10-CM | POA: Insufficient documentation

## 2014-11-28 DIAGNOSIS — M25572 Pain in left ankle and joints of left foot: Secondary | ICD-10-CM | POA: Diagnosis present

## 2014-11-28 DIAGNOSIS — Z8719 Personal history of other diseases of the digestive system: Secondary | ICD-10-CM | POA: Insufficient documentation

## 2014-11-28 DIAGNOSIS — Z79899 Other long term (current) drug therapy: Secondary | ICD-10-CM | POA: Insufficient documentation

## 2014-11-28 DIAGNOSIS — Z7982 Long term (current) use of aspirin: Secondary | ICD-10-CM | POA: Insufficient documentation

## 2014-11-28 DIAGNOSIS — I2510 Atherosclerotic heart disease of native coronary artery without angina pectoris: Secondary | ICD-10-CM | POA: Diagnosis not present

## 2014-11-28 DIAGNOSIS — M79672 Pain in left foot: Secondary | ICD-10-CM | POA: Diagnosis not present

## 2014-11-28 DIAGNOSIS — M79671 Pain in right foot: Secondary | ICD-10-CM | POA: Diagnosis not present

## 2014-11-28 DIAGNOSIS — Z9861 Coronary angioplasty status: Secondary | ICD-10-CM | POA: Insufficient documentation

## 2014-11-28 DIAGNOSIS — I509 Heart failure, unspecified: Secondary | ICD-10-CM | POA: Diagnosis not present

## 2014-11-28 DIAGNOSIS — R52 Pain, unspecified: Secondary | ICD-10-CM

## 2014-11-28 MED ORDER — TRAMADOL HCL 50 MG PO TABS: 50.0000 mg | ORAL_TABLET | Freq: Four times a day (QID) | ORAL | Status: DC | PRN

## 2014-11-28 NOTE — ED Notes (Signed)
Left foot and ankle pain onset saturday.  Unable to walk  On it. Denies injury

## 2014-11-28 NOTE — Discharge Instructions (Signed)
Your xray does not reveal a bony injury to the foot or ankle. There is currently no indication of infection.  Please take a picture of the area of redness on your foot, and use your walker or crutches as discussed.  Follow-up with your primary care provider.   Arthralgia Arthralgia is joint pain. A joint is a place where two bones meet. Joint pain can happen for many reasons. The joint can be bruised, stiff, infected, or weak from aging. Pain usually goes away after resting and taking medicine for soreness.  HOME CARE  Rest the joint as told by your doctor.  Keep the sore joint raised (elevated) for the first 24 hours.  Put ice on the joint area.  Put ice in a plastic bag.  Place a towel between your skin and the bag.  Leave the ice on for 15-20 minutes, 03-04 times a day.  Wear your splint, casting, elastic bandage, or sling as told by your doctor.  Only take medicine as told by your doctor. Do not take aspirin.  Use crutches as told by your doctor. Do not put weight on the joint until told to by your doctor. GET HELP RIGHT AWAY IF:   You have bruising, puffiness (swelling), or more pain.  Your fingers or toes turn blue or start to lose feeling (numb).  Your medicine does not lessen the pain.  Your pain becomes severe.  You have a temperature by mouth above 102 F (38.9 C), not controlled by medicine.  You cannot move or use the joint. MAKE SURE YOU:   Understand these instructions.  Will watch your condition.  Will get help right away if you are not doing well or get worse. Document Released: 10/16/2009 Document Revised: 01/20/2012 Document Reviewed: 10/16/2009 [facility] Patient Information 2015 [place], [place]. This information is not intended to replace advice given to you by your health care provider. Make sure you discuss any questions you have with your health care provider.

## 2014-11-28 NOTE — ED Provider Notes (Signed)
CSN: [id number]     Arrival date & time 11/28/14  1104 History  This chart was scribed for non-physician practitioner, Deborah Chalk, NP, working with Hoy Morn, MD, by Stephania Fragmin, ED Scribe. This patient was seen in room APFT21/APFT21 and the patient's care was started at 1:57 PM.    Chief Complaint  Patient presents with  . Ankle Pain   Patient is a 74 y.o. female presenting with ankle pain. The history is provided by the patient. No language interpreter was used.  Ankle Pain Location:  Ankle and foot Time since incident:  2 days Injury: no   Ankle location:  L ankle Foot location:  L foot Pain details:    Radiates to:  Does not radiate   Severity:  Moderate   Onset quality:  Gradual   Duration:  2 days   Timing:  Constant   Progression:  Worsening Chronicity:  New Dislocation: no   Foreign body present:  No foreign bodies Prior injury to area:  No Relieved by:  Nothing Worsened by:  Bearing weight Ineffective treatments:  None tried Associated symptoms: no fever      HPI Comments: Elizabeth Aguilar is a 74 y.o. female with a history of gout and CAD who presents to the Emergency Department complaining of gradual onset left ankle pain with unknown cause that began a while ago but which worsened 2 days ago, to the point where she is unable to ambulate. Patient also complains of a left shin contusion that occurred after patient dropped kitchen equipment on it a couple weeks ago; she missed dropping it on her foot. She is unable to take medication for gout due to a history of MI and CAD. She denies fever.    Past Medical History  Diagnosis Date  . Irritable bowel syndrome   . Hyperlipidemia   . Type 2 diabetes mellitus   . CAD (coronary artery disease)     a.  s/p STEMI s/p DES to LAD and RCA (12/2013)  . Pericarditis   . Chronic diastolic CHF (congestive heart failure)   . Chest pain 10/10/2014   Past Surgical History  Procedure Laterality Date  . Coronary angioplasty  with stent placement    . Left heart catheterization with coronary angiogram N/A 12/16/2013    Procedure: LEFT HEART CATHETERIZATION WITH CORONARY ANGIOGRAM;  Surgeon: Troy Sine, MD;  Location: [facility] CATH LAB;  Service: Cardiovascular;  Laterality: N/A;  . Percutaneous coronary stent intervention (pci-s) N/A 12/17/2013    Procedure: PERCUTANEOUS CORONARY STENT INTERVENTION (PCI-S);  Surgeon: Troy Sine, MD;  Location: [facility] CATH LAB;  Service: Cardiovascular;  Laterality: N/A;  . Left heart catheterization with coronary angiogram  12/17/2013    Procedure: LEFT HEART CATHETERIZATION WITH CORONARY ANGIOGRAM;  Surgeon: Troy Sine, MD;  Location: [facility] CATH LAB;  Service: Cardiovascular;;   Family History  Problem Relation Age of Onset  . Diabetes Mother   . Diabetes Daughter   . Diabetes Son    History  Substance Use Topics  . Smoking status: Never Smoker   . Smokeless tobacco: Not on file  . Alcohol Use: No   OB History    No data available     Review of Systems  Constitutional: Negative for fever.  Musculoskeletal: Positive for myalgias and arthralgias.  All other systems reviewed and are negative.     Allergies  Actos; Ceftin; Celebrex; Darvocet; Doxycycline; Erythromycin; Sulfa antibiotics; Vioxx; Zithromax; and Zocor  Home Medications  Prior to Admission medications   Medication Sig Start Date End Date Taking? Authorizing Provider  allopurinol (ZYLOPRIM) 100 MG tablet Take 1 tablet (100 mg total) by mouth daily. 10/31/14  Yes Mikey Kirschner, MD  ALPRAZolam Duanne Moron) 0.5 MG tablet TAKE ONE TABLET TWICE DAILY AS NEEDED 08/23/14  Yes Kathyrn Drown, MD  aspirin EC 81 MG EC tablet Take 1 tablet (81 mg total) by mouth daily. 12/20/13  Yes Eileen Stanford, PA-C  atorvastatin (LIPITOR) 40 MG tablet Take 1 tablet (40 mg total) by mouth daily at 6 PM. 10/04/14  Yes Kathyrn Drown, MD  esomeprazole (NEXIUM) 40 MG capsule Take 1 capsule (40 mg total) by mouth every morning. 10/04/14   Yes Kathyrn Drown, MD  fluticasone (FLONASE) 50 MCG/ACT nasal spray USE 2 SPRAYS IN EACH NOSTRIL DAILY 11/10/14  Yes Kathyrn Drown, MD  hydrochlorothiazide (HYDRODIURIL) 25 MG tablet Take 0.5 tablets (12.5 mg total) by mouth daily. 10/04/14  Yes Kathyrn Drown, MD  HYDROcodone-acetaminophen (NORCO/VICODIN) 5-325 MG per tablet Take 1 tablet po 4 times daily prn Patient taking differently: Take 1 tablet by mouth 4 (four) times daily as needed (pain).  10/04/14  Yes Kathyrn Drown, MD  ketorolac (ACULAR) 0.5 % ophthalmic solution Place 1 drop into both eyes 3 (three) times daily.    Yes Historical Provider, MD  lisinopril (PRINIVIL,ZESTRIL) 2.5 MG tablet TAKE ONE (1) TABLET EACH DAY 11/10/14  Yes Kathyrn Drown, MD  loratadine (CLARITIN) 10 MG tablet Take 1 tablet (10 mg total) by mouth daily. 10/04/14  Yes Kathyrn Drown, MD  metFORMIN (GLUCOPHAGE) 500 MG tablet Take 1 tablet (500 mg total) by mouth 2 (two) times daily with a meal. 10/04/14  Yes Kathyrn Drown, MD  metoprolol succinate (TOPROL-XL) 50 MG 24 hr tablet Take 25-50 mg by mouth 2 (two) times daily. Takes one whole tablet in the morning and takes one-half tablet at bedtimeTake with or immediately following a meal.   Yes Historical Provider, MD  Multiple Vitamin (MULTIVITAMIN) tablet Take 1 tablet by mouth daily.   Yes Historical Provider, MD  Ticagrelor (BRILINTA) 90 MG TABS tablet Take 1 tablet (90 mg total) by mouth 2 (two) times daily. 12/20/13  Yes Eileen Stanford, PA-C  vitamin C (ASCORBIC ACID) 500 MG tablet Take 500 mg by mouth daily.   Yes Historical Provider, MD  alendronate (FOSAMAX) 70 MG tablet Take 1 tablet (70 mg total) by mouth once a week. Take with a full glass of water on an empty stomach. Patient taking differently: Take 70 mg by mouth once a week. Take with a full glass of water on an empty stomach.Marlana Latus) 06/14/14   Kathyrn Drown, MD  diphenoxylate-atropine (LOMOTIL) 2.5-0.025 MG per tablet Take 1 tablet by mouth 3  (three) times daily as needed for diarrhea or loose stools. 09/19/14   Kathyrn Drown, MD  nitroGLYCERIN (NITROSTAT) 0.4 MG SL tablet Place 1 tablet (0.4 mg total) under the tongue every 5 (five) minutes x 3 doses as needed for chest pain. 12/20/13   Eileen Stanford, PA-C  ondansetron (ZOFRAN-ODT) 8 MG disintegrating tablet Take 1 tablet (8 mg total) by mouth every 8 (eight) hours as needed for nausea or vomiting. 10/04/14   Kathyrn Drown, MD   BP 109/56 mmHg  Pulse 94  Temp(Src) 98 F (36.7 C) (Oral)  Resp 18  Ht 5\' 3"  (1.6 m)  Wt 141 lb (63.957 kg)  BMI 24.98 kg/m2  SpO2  100% Physical Exam  Constitutional: She is oriented to person, place, and time. She appears well-developed and well-nourished. No distress.  HENT:  Head: Normocephalic and atraumatic.  Eyes: Conjunctivae and EOM are normal.  Neck: Neck supple. No tracheal deviation present.  Cardiovascular: Normal rate and regular rhythm.   Pulmonary/Chest: Effort normal. No respiratory distress.  Musculoskeletal: Normal range of motion. She exhibits tenderness (along the left ankle and left forefoot; not hot to the touch.).  Neurological: She is alert and oriented to person, place, and time.  Skin: Skin is warm and dry.  Psychiatric: She has a normal mood and affect. Her behavior is normal.  Nursing note and vitals reviewed.   ED Course  Procedures (including critical care time)  DIAGNOSTIC STUDIES: Oxygen Saturation is 100% on room air, normal by my interpretation.    COORDINATION OF CARE: 2:00 PM - Discussed treatment plan with pt at bedside which includes left ankle XR and pt agreed to plan.   Labs Review Labs Reviewed - No data to display  Imaging Review No results found.   EKG Interpretation None     Patient discussed with and seen by Dr. Venora Maples.  Radiology results reviewed and shared with patient.  No indication of infectious or acute bony process..  Question gout flare. Rest, elevation, analgesics.  Follow-up  with PCP.  MDM   Final diagnoses:  Pain   Right foot and ankle pain.  I personally performed the services described in this documentation, which was scribed in my presence. The recorded information has been reviewed and is accurate.     Norman Herrlich, NP 11/28/14 [place], MD 11/30/14 [phone]

## 2014-11-29 ENCOUNTER — Telehealth: Payer: Self-pay | Admitting: Family Medicine

## 2014-11-29 NOTE — Telephone Encounter (Signed)
Pt was seen in the ER for foot pain and is stating that she isnt able to put It on the floor or walk on it. Pt states that she was given ultram in the ed but It isnt touching the pain. Pt is also on hydrocodone as well and states that  isnt touching it either. Pt is wanting something done.

## 2014-11-29 NOTE — Telephone Encounter (Signed)
appt tomorrow with Dr. Nicki Reaper for a follow up per Dr. Richardson Landry. Pt transferred to front to schedule appt.

## 2014-11-30 ENCOUNTER — Ambulatory Visit (INDEPENDENT_AMBULATORY_CARE_PROVIDER_SITE_OTHER): Payer: Medicare Other | Admitting: Family Medicine

## 2014-11-30 ENCOUNTER — Encounter: Payer: Self-pay | Admitting: Family Medicine

## 2014-11-30 VITALS — BP 100/70 | Ht 63.0 in

## 2014-11-30 DIAGNOSIS — M109 Gout, unspecified: Secondary | ICD-10-CM

## 2014-11-30 MED ORDER — PREDNISONE 10 MG PO TABS: ORAL_TABLET | ORAL | Status: DC

## 2014-11-30 MED ORDER — OXYCODONE-ACETAMINOPHEN 5-325 MG PO TABS: ORAL_TABLET | ORAL | Status: DC

## 2014-11-30 MED ORDER — ALLOPURINOL 100 MG PO TABS: 200.0000 mg | ORAL_TABLET | Freq: Every day | ORAL | Status: DC

## 2014-11-30 NOTE — Progress Notes (Signed)
   Subjective:    Patient ID: Elizabeth Aguilar, female    DOB: [birth year], 74 y.o.   MRN: [id number]  Foot Pain This is a new problem. The current episode started in the past 7 days. The problem occurs constantly. The problem has been unchanged. The symptoms are aggravated by standing. Treatments tried: Pain medication. The treatment provided no relief.  started fri night  Pain in the ankle, no recent pain but has turned over multi times in the past  Taking th gout faithfully, allopurinol 100 mg daily. Was unable to take culture seen. Caused GI symptomatology.  Went ot the er, and they gave her pain meds   It is the patient's the left foot.  Patient states that she has no other concerns at this time.   ER note reviewed and in front of patient and family.  Review of Systems No chest pain no headache no back pain abdominal pain no change in bowel habits    Objective:   Physical Exam  Alert no acute distress mild malaise. Vital stable lungs clear heart regular rate and rhythm. Left foot erythema distinct tenderness first metatarsophalangeal joint and dorsal metatarsal region.      Impression 1 Assessment & Plan:  Impression 1 gout discussed #2 mild renal insufficiency. #3 chronic hydrocodone dosage in. Plan prednisone taper. Increase allopurinol to 200 mg daily. Add oxycodone for pain. Local measures discussed. WSL

## 2014-12-12 ENCOUNTER — Ambulatory Visit (INDEPENDENT_AMBULATORY_CARE_PROVIDER_SITE_OTHER): Payer: Medicare Other | Admitting: Family Medicine

## 2014-12-12 ENCOUNTER — Encounter: Payer: Self-pay | Admitting: Family Medicine

## 2014-12-12 VITALS — BP 110/80 | Ht 63.0 in

## 2014-12-12 DIAGNOSIS — M064 Inflammatory polyarthropathy: Secondary | ICD-10-CM

## 2014-12-12 DIAGNOSIS — M1990 Unspecified osteoarthritis, unspecified site: Secondary | ICD-10-CM

## 2014-12-12 MED ORDER — METHYLPREDNISOLONE ACETATE 40 MG/ML IJ SUSP
40.0000 mg | Freq: Once | INTRAMUSCULAR | Status: AC
  Administered 2014-12-12: 40 mg via INTRAMUSCULAR

## 2014-12-12 MED ORDER — OXYCODONE-ACETAMINOPHEN 5-325 MG PO TABS: ORAL_TABLET | ORAL | Status: DC

## 2014-12-12 NOTE — Progress Notes (Signed)
   Subjective:    Patient ID: Elizabeth Aguilar, female    DOB: [birth year], 74 y.o.   MRN: [id number]  Hip Pain  The incident occurred 3 to 5 days ago. There was no injury mechanism. The pain is present in the right hip and left foot. The quality of the pain is described as aching. The pain is at a severity of 10/10. The pain is moderate. The pain has been constant since onset. Associated symptoms include an inability to bear weight. She reports no foreign bodies present. The symptoms are aggravated by movement. Treatments tried: pain medication. The treatment provided mild relief.   Patient states that she has no other concerns at this time.    Review of Systems Right low back pain radiates into the hip left foot pain denies sweats chills fevers.    Objective:   Physical Exam She has tenderness in the left foot recent x-rays were negative Tenderness in the right low back leg muscles nontender no rash noted       Assessment & Plan:  Arthralgias-she was supposed to get uric acid completed she has not done that yet she will. Blood work when she was in the hospital for her heart showed elevated sedimentation rate as well as C-reactive been I believe it's reasonable to send her to a rheumatologist for further vibration. She is on a blood thinner because of her heart which limits what can be done for her from a standpoint of anti-inflammatories. Depo-Medrol shot given today.  Patient was told to double up on her hydrocodone over the course of the next couple weeks then she may switch to oxycodone 5 mg 4 times a day to see if this will get her pain under better control. She was given a prescription for 120 tablets of Percocet 5 mg/325 she may fill this on February 15 she is to follow-up in 4-6 weeks

## 2014-12-12 NOTE — Patient Instructions (Signed)

## 2014-12-23 ENCOUNTER — Other Ambulatory Visit: Payer: Self-pay | Admitting: Family Medicine

## 2014-12-23 DIAGNOSIS — E785 Hyperlipidemia, unspecified: Secondary | ICD-10-CM | POA: Diagnosis not present

## 2014-12-23 DIAGNOSIS — M109 Gout, unspecified: Secondary | ICD-10-CM | POA: Diagnosis not present

## 2014-12-23 LAB — URIC ACID: URIC ACID, SERUM: 5.9 mg/dL (ref 2.4–7.0)

## 2014-12-23 LAB — LIPID PANEL
Cholesterol: 164 mg/dL (ref 0–200)
HDL: 52 mg/dL (ref >39)
LDL Cholesterol: 86 mg/dL (ref 0–99)
Total CHOL/HDL Ratio: 3.2 Ratio
Triglycerides: 130 mg/dL (ref <150)
VLDL: 26 mg/dL (ref 0–40)

## 2014-12-27 ENCOUNTER — Telehealth: Payer: Self-pay | Admitting: Cardiovascular Disease

## 2014-12-27 NOTE — Telephone Encounter (Signed)
Mrs. Touch is calling because her chest is hurting and not sure if she is getting an infection around her heart or fluids in her lungs . Please call   Thanks

## 2014-12-27 NOTE — Telephone Encounter (Signed)
Returned call to patient she stated for the past 2 days she feels like she did when she had fluid around her heart.Stated she is having chest pain when she breathes,sob.Stated she would like to be seen.Advised no appointments at [facility] office.Will call [facility] tomorrow 12/28/14 for appointment.

## 2014-12-28 NOTE — Telephone Encounter (Signed)
Had attempted to put in on today's schedule. Scheduling pool was involved trying to find opening. Still in progress.

## 2014-12-28 NOTE — Telephone Encounter (Signed)
Spoke w/ Dalene Seltzer, pt had cited unavailability to come in for appt today or tomorrow here due to other schedule conflicts.

## 2014-12-29 ENCOUNTER — Ambulatory Visit: Payer: Medicare Other | Admitting: Family Medicine

## 2015-01-02 NOTE — Telephone Encounter (Signed)
Spoke w/ patient this afternoon. She states she feels "much better" than last week. She denies pain in chest, stating that she has occasional "fullness", states she has a sensation of feeling uncomfortable when lying on her left side.  . Asked her to elaborate on this feeling and she was not able to give a lot of description.  She denies leg swelling, weight gain, cough, or dyspnea. She is able to do daily activities, denies fatigue. She does track her weight regularly & reports no observable gain.  I verbalized concern about the "fullness" she was trying to describe, and I asked if she felt like being worked in soon for appt. She declined & states she will keep appt for March 7th & call if she feels any worse.

## 2015-01-09 ENCOUNTER — Encounter: Payer: Self-pay | Admitting: Family Medicine

## 2015-01-09 ENCOUNTER — Ambulatory Visit (INDEPENDENT_AMBULATORY_CARE_PROVIDER_SITE_OTHER): Payer: Medicare Other | Admitting: Family Medicine

## 2015-01-09 VITALS — BP 110/70 | Ht 63.0 in | Wt 143.0 lb

## 2015-01-09 DIAGNOSIS — M25559 Pain in unspecified hip: Secondary | ICD-10-CM | POA: Diagnosis not present

## 2015-01-09 DIAGNOSIS — M25569 Pain in unspecified knee: Secondary | ICD-10-CM | POA: Diagnosis not present

## 2015-01-09 MED ORDER — HYDROCODONE-ACETAMINOPHEN 5-325 MG PO TABS: 1.0000 | ORAL_TABLET | Freq: Four times a day (QID) | ORAL | Status: DC | PRN

## 2015-01-09 MED ORDER — ONDANSETRON 8 MG PO TBDP: 8.0000 mg | ORAL_TABLET | Freq: Three times a day (TID) | ORAL | Status: DC | PRN

## 2015-01-09 NOTE — Patient Instructions (Signed)

## 2015-01-09 NOTE — Progress Notes (Signed)
   Subjective:    Patient ID: Elizabeth Aguilar, female    DOB: 07-May-1941, 74 y.o.   MRN: [id number]  HPI This patient was seen today for chronic pain  The medication list was reviewed and updated.   -Compliance with pain medication: yes  The patient was advised the importance of maintaining medication and not using illegal substances with these.  Refills needed: yes  The patient was educated that we can provide 3 monthly scripts for their medication, it is their responsibility to follow the instructions.  Side effects or complications from medications: none  Patient is aware that pain medications are meant to minimize the severity of the pain to allow their pain levels to improve to allow for better function. They are aware of that pain medications cannot totally remove their pain.  Due for UDT ( at least once per year) :  To be done this year   Patient is also here to follow up on her foot and hip pain.  She relates that the pain is reallynot getting any better        Review of Systems     Patient relates hip pain back pain bilateral foot pain. Objective:   Physical Exam  on exam subjective low back pain also subjective right hip pain and bilateral great MTP pain lungs clar heartregular       Assessment & Plan:   arthritic changes pain medications were written. Follow-up in a couple months time to see how she is doing. Consider referral to orthopedics if it gets progressively worse.   because of her anticoagulant she cannot be on anti-inflammatories. Certainly if he gets worse referral to orthopedics

## 2015-01-11 ENCOUNTER — Other Ambulatory Visit: Payer: Self-pay | Admitting: Family Medicine

## 2015-01-15 NOTE — Telephone Encounter (Signed)
I checked the drug registry. This patient had oxycodone 5 mg/325 mg, #120 filled on 12/28/2014. She should not be taking hydrocodone. Please clarify with patient. The oxycodone was used because she was having severe pain that she stated that the hydrocodone was not working. If for some reason we are going back to the hydrocodone then we cannot do that until she is either out of the oxycodone or she turns on unused portion in. This will require some phone calls to clarify then let me know. As for the Xanax it would be fine to give 4 refills

## 2015-01-16 ENCOUNTER — Ambulatory Visit (INDEPENDENT_AMBULATORY_CARE_PROVIDER_SITE_OTHER): Payer: Medicare Other | Admitting: Cardiovascular Disease

## 2015-01-16 VITALS — BP 122/62 | HR 86 | Ht 63.5 in | Wt 144.1 lb

## 2015-01-16 DIAGNOSIS — E785 Hyperlipidemia, unspecified: Secondary | ICD-10-CM

## 2015-01-16 DIAGNOSIS — I10 Essential (primary) hypertension: Secondary | ICD-10-CM

## 2015-01-16 DIAGNOSIS — I319 Disease of pericardium, unspecified: Secondary | ICD-10-CM

## 2015-01-16 DIAGNOSIS — I2102 ST elevation (STEMI) myocardial infarction involving left anterior descending coronary artery: Secondary | ICD-10-CM

## 2015-01-16 DIAGNOSIS — I255 Ischemic cardiomyopathy: Secondary | ICD-10-CM | POA: Diagnosis not present

## 2015-01-16 MED ORDER — EZETIMIBE 10 MG PO TABS: 10.0000 mg | ORAL_TABLET | Freq: Every day | ORAL | Status: DC

## 2015-01-16 NOTE — Patient Instructions (Signed)
Your physician has recommended you make the following change in your medication: start new prescription for zetia. Get some CoQ10 over the counter and take 200-300 mg daily.  Your physician has requested that you have an echocardiogram. Echocardiography is a painless test that uses sound waves to create images of your heart. It provides your doctor with information about the size and shape of your heart and how well your heart's chambers and valves are working. This procedure takes approximately one hour. There are no restrictions for this procedure.   Your physician recommends that you schedule a follow-up appointment in: 2-3 months.

## 2015-01-18 ENCOUNTER — Other Ambulatory Visit: Payer: Self-pay | Admitting: *Deleted

## 2015-01-18 MED ORDER — ALPRAZOLAM 0.5 MG PO TABS: ORAL_TABLET | ORAL | Status: DC

## 2015-01-18 NOTE — Telephone Encounter (Signed)
Discussed with pt. Pt states she did not request hydrocodone or xanax from pharm. She called pharm and they told her she was on auto refills. Xanax sent to pharm. Pt states she was switched to oxycodone but wants to go back  On hydrocodone when the oxycodone runs out. She states she already has an rx for hydrocodne that she will fill when the oxycodone runs out. She states hydrocodne works back.

## 2015-01-19 ENCOUNTER — Encounter: Payer: Self-pay | Admitting: Cardiovascular Disease

## 2015-01-19 NOTE — Progress Notes (Signed)
Patient ID: Elizabeth Aguilar, female   DOB: August 22, 1941, 74 y.o.   MRN: [id number]    HPI: Elizabeth Aguilar is a 74 y.o. female who presents for six-month cardiology follow-up evaluation.  Since I last seen her, she had been hospitalized with pericarditis.  Elizabeth Aguilar suffered an ST segment elevation myocardial infarction and was transported from [facility] on 12/16/2013 in the setting of anterolateral MI.  She was taken acutely to the catheterization laboratory, which showed acute LV dysfunction with an ejection fraction of 35-40% with severe hypokinesis involving the mid distal anterolateral wall, apex, and extending to involve the apical, inferior segment.  She had 2+ angiographic mitral regurgitation.  She underwent successful stenting of her total occluded LAD and a 3.0x24 mm Promus premier DES stent was inserted. Due to high grade concomitant CAD, she was brought back to the cardiac catheterization laboratory the following day where LAD stent was widely patent.  Her high-grade focal RCA stenosis of greater than 90% was successfully treated with with ultimate stenting with a 3.0x24 mm Promus premier DES stent, postdilated to 3.26 mm.  She has been on aspirin/Brilinta for dual antiplatelet therapy.  She was discharged with a Life-vest, February 7 she did have a 5 beat run of VT. Her pre-discharge her ejection fraction was 25%.  When I saw her in March 2015, her resting pulse was 104 beats per minute, and I further titrated Toprol-XL to 100 mg in the morning and 50 mg at night.  She  felt improved on this increase beta blocker regimen.   Afollowup echo Doppler study on 03/03/2014 demonstrated improved LV function  from 25% to 40% and there was evidence for residual hypokinesis of the distal left ventricle with akinesis of the apex.  Normal LV cavity size.  There was grade 1 diastolic dysfunction.    With improvement in her LV function, we discontinued her lifevest.  She   developed a significant GI  illness  ago and lost proximally 10-12 pounds.  At that time, her blood pressure became low and she was taken off her spironolactone and apparently her metoprolol succinate was reduced to just 50 mg.   Since I last saw her in September, she was hospitalized from November 30 through 10/24/2014 with pericarditis.  An echo Doppler study done in the hospital showed a moderate free-flowing pericardial effusion circumferential to the heart, without evidence for hemodynamic compromise.  Ejection fraction was 45-50%.  There was grade 1 diastolic dysfunction.  Since she was on dual antiplatelet therapy, she was treated with colchicine and not nonsteroidal anti-inflammatory agents.  She subsequently developed diarrhea with colchicine.  She was seen in follow-up of her hospitalization by Elizabeth Aguilar.  She presents now for follow-up cardiology evaluation.  At times she still complains of some mild pleuritic-like chest pain.  She denies fevers, chills, night sweats.  She has been maintained on dual antiplatelet therapy.  She is diabetic.  She has a history of hyperlipidemia and has been taking atorvastatin 40 mg.  Past Medical History  Diagnosis Date  . Irritable bowel syndrome   . Hyperlipidemia   . Type 2 diabetes mellitus   . CAD (coronary artery disease)     a.  s/p STEMI s/p DES to LAD and RCA (12/2013)  . Pericarditis   . Chronic diastolic CHF (congestive heart failure)   . Chest pain 10/10/2014    Past Surgical History  Procedure Laterality Date  . Coronary angioplasty with stent placement    .  Left heart catheterization with coronary angiogram N/A 12/16/2013    Procedure: LEFT HEART CATHETERIZATION WITH CORONARY ANGIOGRAM;  Surgeon: Troy Sine, MD;  Location: [facility] CATH LAB;  Service: Cardiovascular;  Laterality: N/A;  . Percutaneous coronary stent intervention (pci-s) N/A 12/17/2013    Procedure: PERCUTANEOUS CORONARY STENT INTERVENTION (PCI-S);  Surgeon: Troy Sine, MD;  Location: [facility] CATH  LAB;  Service: Cardiovascular;  Laterality: N/A;  . Left heart catheterization with coronary angiogram  12/17/2013    Procedure: LEFT HEART CATHETERIZATION WITH CORONARY ANGIOGRAM;  Surgeon: Troy Sine, MD;  Location: [facility] CATH LAB;  Service: Cardiovascular;;    Allergies  Allergen Reactions  . Actos [Pioglitazone] Swelling  . Ceftin [Cefuroxime Axetil] Nausea Only  . Celebrex [Celecoxib]     Sickness  . Darvocet [Propoxyphene N-Acetaminophen] Nausea Only  . Doxycycline Nausea And Vomiting and Other (See Comments)    Bones ache  . Erythromycin Nausea And Vomiting  . Sulfa Antibiotics Nausea And Vomiting and Other (See Comments)    Bones ache   . Vioxx [Rofecoxib]     Sickness   . Zithromax [Azithromycin] Nausea Only  . Zocor [Simvastatin]     Side effects    Current Outpatient Prescriptions  Medication Sig Dispense Refill  . alendronate (FOSAMAX) 70 MG tablet Take 1 tablet (70 mg total) by mouth once a week. Take with a full glass of water on an empty stomach. (Patient taking differently: Take 70 mg by mouth once a week. Take with a full glass of water on an empty stomach.(wednesday)) 4 tablet 12  . allopurinol (ZYLOPRIM) 100 MG tablet Take 2 tablets (200 mg total) by mouth daily. 60 tablet 5  . aspirin EC 81 MG EC tablet Take 1 tablet (81 mg total) by mouth daily.    [name] atorvastatin (LIPITOR) 40 MG tablet Take 1 tablet (40 mg total) by mouth daily at 6 PM. 30 tablet 12  . diphenoxylate-atropine (LOMOTIL) 2.5-0.025 MG per tablet Take 1 tablet by mouth 3 (three) times daily as needed for diarrhea or loose stools. 20 tablet 0  . esomeprazole (NEXIUM) 40 MG capsule Take 1 capsule (40 mg total) by mouth every morning. 30 capsule 12  . fluticasone (FLONASE) 50 MCG/ACT nasal spray USE 2 SPRAYS IN EACH NOSTRIL DAILY 16 g 5  . hydrochlorothiazide (HYDRODIURIL) 25 MG tablet Take 0.5 tablets (12.5 mg total) by mouth daily. 15 tablet 12  . HYDROcodone-acetaminophen (NORCO/VICODIN) 5-325 MG per  tablet Take 1 tablet by mouth every 6 (six) hours as needed. 120 tablet 0  . ketorolac (ACULAR) 0.5 % ophthalmic solution INSTILL 1 DROP IN BOTH EYES FOUR TIMES ADAY 5 mL 11  . lisinopril (PRINIVIL,ZESTRIL) 2.5 MG tablet TAKE ONE (1) TABLET EACH DAY 30 tablet 4  . loratadine (CLARITIN) 10 MG tablet Take 1 tablet (10 mg total) by mouth daily. 30 tablet 12  . metFORMIN (GLUCOPHAGE) 500 MG tablet Take 1 tablet (500 mg total) by mouth 2 (two) times daily with a meal. 60 tablet 12  . metoprolol succinate (TOPROL-XL) 50 MG 24 hr tablet Take 25-50 mg by mouth 2 (two) times daily. Takes one whole tablet in the morning and takes one-half tablet at bedtimeTake with or immediately following a meal.    . Multiple Vitamin (MULTIVITAMIN) tablet Take 1 tablet by mouth daily.    . nitroGLYCERIN (NITROSTAT) 0.4 MG SL tablet Place 1 tablet (0.4 mg total) under the tongue every 5 (five) minutes x 3 doses as needed for chest pain. 25  tablet 12  . ondansetron (ZOFRAN-ODT) 8 MG disintegrating tablet Take 1 tablet (8 mg total) by mouth every 8 (eight) hours as needed for nausea or vomiting. 21 tablet 6  . Ticagrelor (BRILINTA) 90 MG TABS tablet Take 1 tablet (90 mg total) by mouth 2 (two) times daily. 60 tablet 11  . vitamin C (ASCORBIC ACID) 500 MG tablet Take 500 mg by mouth daily.    [name] ALPRAZolam (XANAX) 0.5 MG tablet TAKE ONE TABLET TWICE DAILY AS NEEDED 60 tablet 3  . ezetimibe (ZETIA) 10 MG tablet Take 1 tablet (10 mg total) by mouth daily. 30 tablet 6   No current facility-administered medications for this visit.    History   Social History  . Marital Status: Widowed    Spouse Name: N/A  . Number of Children: N/A  . Years of Education: N/A   Occupational History  . Not on file.   Social History Main Topics  . Smoking status: Never Smoker   . Smokeless tobacco: Not on file  . Alcohol Use: No  . Drug Use: No  . Sexual Activity: Not on file   Other Topics Concern  . Not on file   Social History  Narrative   Socially, she is widowed for 8 years.  She has 4 children and 3 grandchildren.  She completed eighth grade of education.  There is no history of tobacco use.  Family History  Problem Relation Age of Onset  . Diabetes Mother   . Diabetes Daughter   . Diabetes Son     ROS General: Negative; No fevers, chills, or night sweats;  HEENT:  Positive for recent tooth abscess for which he did undergo antibiotic therapy.  no changes in vision or hearing, sinus congestion, difficulty swallowing Pulmonary: Negative; No cough, wheezing, shortness of breath, hemoptysis Cardiovascular: Negative; No chest pain, presyncope, syncope, palpatations GI: Recent GI illness, resolved ; No nausea, vomiting, diarrhea, or abdominal pain GU: Negative; No dysuria, hematuria, or difficulty voiding Musculoskeletal: Positive for occasional arthralgias of her knees and hips; no myalgias, or weakness Hematologic/Oncology: Negative; no easy bruising, bleeding Endocrine: Positive for type 2 diabetes mellitus.; no heat/cold intolerance Neuro: Negative; no changes in balance, headaches Skin: Negative; No rashes or skin lesions Psychiatric: Negative; No behavioral problems, depression Sleep: Negative; No snoring, daytime sleepiness, hypersomnolence, bruxism, restless legs, hypnogognic hallucinations, no cataplexy Other comprehensive 14 point system review is negative   PE BP 122/62 mmHg  Pulse 86  Ht 5' 3.5" (1.613 m)  Wt 144 lb 1.6 oz (65.363 kg)  BMI 25.12 kg/m2  General: Alert, oriented, no distress.  Skin: normal turgor, no rashes HEENT: Normocephalic, atraumatic. Pupils round and reactive; sclera anicteric;no lid lag. Extraocular muscles intact;; no xanthelasmas. Nose without nasal septal hypertrophy Mouth/Parynx benign; Mallinpatti scale 3 Neck: No JVD, no carotid bruits; normal carotid upstroke Lungs: clear to ausculatation and percussion; no wheezing or rales Chest wall: no tenderness to  palpitation Heart: RRR, s1 s2 normal; 1/6 systolic murmur.  no diastolic murmur, rub thrills or heaves Abdomen: soft, nontender; no hepatosplenomehaly, BS+; abdominal aorta nontender and not dilated by palpation. Back: no CVA tenderness Pulses 2+ Extremities: no clubbing cyanosis or edema, Homan's sign negative  Neurologic: grossly nonfocal; cranial nerves grossly normal. Psychologic: normal affect and mood.  ECG (independently read by me): Normal sinus rhythm at 86 bpm.  Nonspecific T changes.  Normal intervals.  07/19/2014 ECG (independently read by me): Sinus rhythm at 87 beats per minute.  Isolated PVC.  QTc  interval 460 ms.  Nonspecific ST changes.  03/14/2014 ECG (independently read by me): Sinus rhythm at 87 beats per minute.  Preserved anterior R waves, but with corneal T-wave inversion  Prior 01/31/2014 ECG (independently read by me): Sinus rhythm at 104 beats per minute with T-wave inversion anterolaterally V2 to V6, as well as lead 1 and L., concordant with her prior myocardial infarction.  LABS:  BMET  BMP Latest Ref Rng 10/13/2014 10/12/2014 10/11/2014  Glucose 70 - 99 mg/dL 207(H) 121(H) 130(H)  BUN 6 - 23 mg/dL 21 19 16   Creatinine 0.50 - 1.10 mg/dL 1.50(H) 1.29(H) 1.17(H)  Sodium 137 - 147 mEq/L 138 142 139  Potassium 3.7 - 5.3 mEq/L 4.2 3.6(L) 3.4(L)  Chloride 96 - 112 mEq/L 98 102 99  CO2 19 - 32 mEq/L 26 26 26   Calcium 8.4 - 10.5 mg/dL 9.2 8.6 8.6     Hepatic Function Panel     Component Value Date/Time   PROT 6.0 10/10/2014 1023   ALBUMIN 2.4* 10/10/2014 1023   AST 11 10/10/2014 1023   ALT 11 10/10/2014 1023   ALKPHOS 114 10/10/2014 1023   BILITOT 0.6 10/10/2014 1023     CBC  CBC Latest Ref Rng 10/13/2014 10/12/2014 10/11/2014  WBC 4.0 - 10.5 K/uL 6.8 7.6 9.7  Hemoglobin 12.0 - 15.0 g/dL 10.9(L) 10.4(L) 10.0(L)  Hematocrit 36.0 - 46.0 % 33.9(L) 31.3(L) 31.5(L)  Platelets 150 - 400 K/uL 456(H) 366 296     BNP    Component Value Date/Time   PROBNP  1454.0* 10/11/2014 0900    Lipid Panel     Component Value Date/Time   CHOL 164 12/23/2014 0918   TRIG 130 12/23/2014 0918   HDL 52 12/23/2014 0918   CHOLHDL 3.2 12/23/2014 0918   VLDL 26 12/23/2014 0918   LDLCALC 86 12/23/2014 0918     RADIOLOGY: No results found.    ASSESSMENT AND PLAN: Elizabeth Aguilar is a 74 year old female who suffered an ST segment elevation anterolateral myocardial infarction on December 16, 2013 secondary to total occlusion of the LAD.  She had high-grade concomitant CAD and also underwent stage intervention to her RCA.  She has been without recurrent anginal symptoms.  Pre-discharge ejection fraction was 25%, and this has subsequently improved to 35-50%.  She developed a moderate pericardial effusion with pleuritic symptoms leading to her hospitalization in December.  I personally reviewed her hospital records.  She is breathing well.  She denies any episodes of exertional chest pain but at times notes a sharp pleuritic sensation.  She has been on allopurinol for gout and uric acid is 5.9.  She has been taking diuretic with 12.5 mg hydrochlorothiazide in addition to lisinopril 2.5 mg and Toprol-XL 50 mg in the morning and 25 mg at night.  Her blood pressure today is normal without pulsus paradoxic is.  I reviewed her laboratory.  With her CAD, I am recommending addition of Zetia 10 mg to her current dose of atorvastatin.  More aggressive lipid-lowering.  I have suggested coenzyme Q 10.  She does note some mild myalgias on her current dose of atorvastatin.  She will undergo a follow-up echo Doppler study to make certain there is not significant progression of her pericardial effusion and hopeful reduction and potential resolution.  I will see her in the office in approximately 2 months for follow-up evaluation.     Troy Sine, MD .01/19/2015 7:04 PM

## 2015-01-23 ENCOUNTER — Ambulatory Visit (HOSPITAL_COMMUNITY)
Admission: RE | Admit: 2015-01-23 | Discharge: 2015-01-23 | Disposition: A | Discharge status: Home / Self Care | Payer: Medicare Other | Source: Ambulatory Visit | Attending: Cardiology | Admitting: Cardiology

## 2015-01-23 DIAGNOSIS — I319 Disease of pericardium, unspecified: Secondary | ICD-10-CM | POA: Insufficient documentation

## 2015-01-23 NOTE — Progress Notes (Signed)
2D Echo Performed 01/23/2015    Elizabeth Aguilar, RCS

## 2015-01-27 ENCOUNTER — Telehealth: Payer: Self-pay | Admitting: Cardiovascular Disease

## 2015-01-27 NOTE — Telephone Encounter (Signed)
Left message to call back- ask to speak triage nurse

## 2015-01-27 NOTE — Telephone Encounter (Signed)
Return call.  results given to patient . Verbalized understanding.

## 2015-01-27 NOTE — Telephone Encounter (Signed)
Pt would like her echo test results from Monday please. Pt says her chest is still hurting at night when she lays down.

## 2015-01-30 ENCOUNTER — Other Ambulatory Visit: Payer: Self-pay

## 2015-01-30 MED ORDER — METOPROLOL SUCCINATE ER 50 MG PO TB24: ORAL_TABLET | ORAL | Status: DC

## 2015-01-30 NOTE — Telephone Encounter (Signed)
Rx(s) sent to pharmacy electronically.  

## 2015-02-02 ENCOUNTER — Other Ambulatory Visit: Payer: Self-pay | Admitting: Cardiovascular Disease

## 2015-02-02 MED ORDER — METOPROLOL SUCCINATE ER 50 MG PO TB24: ORAL_TABLET | ORAL | Status: DC

## 2015-02-02 NOTE — Telephone Encounter (Signed)
Rx(s) sent to pharmacy electronically.  

## 2015-02-02 NOTE — Telephone Encounter (Signed)
Pt called in stating that the she will need a new prescription for her Metoprolol since the dosage has changed. The directions should now read 1 1/2 po a day. Please call that in to [facility] in [place].   Thanks

## 2015-02-04 IMAGING — DX DG FOOT COMPLETE 3+V*L*
3 series · 3 of 3 positions shown · non-contrast
Comparison: 11/23/2008

CLINICAL DATA: Initial encounter for wall 2 day history of pain and
redness. Tender to touch. No history of injury.

EXAM:
LEFT FOOT - COMPLETE 3+ VIEW

[foot ap]
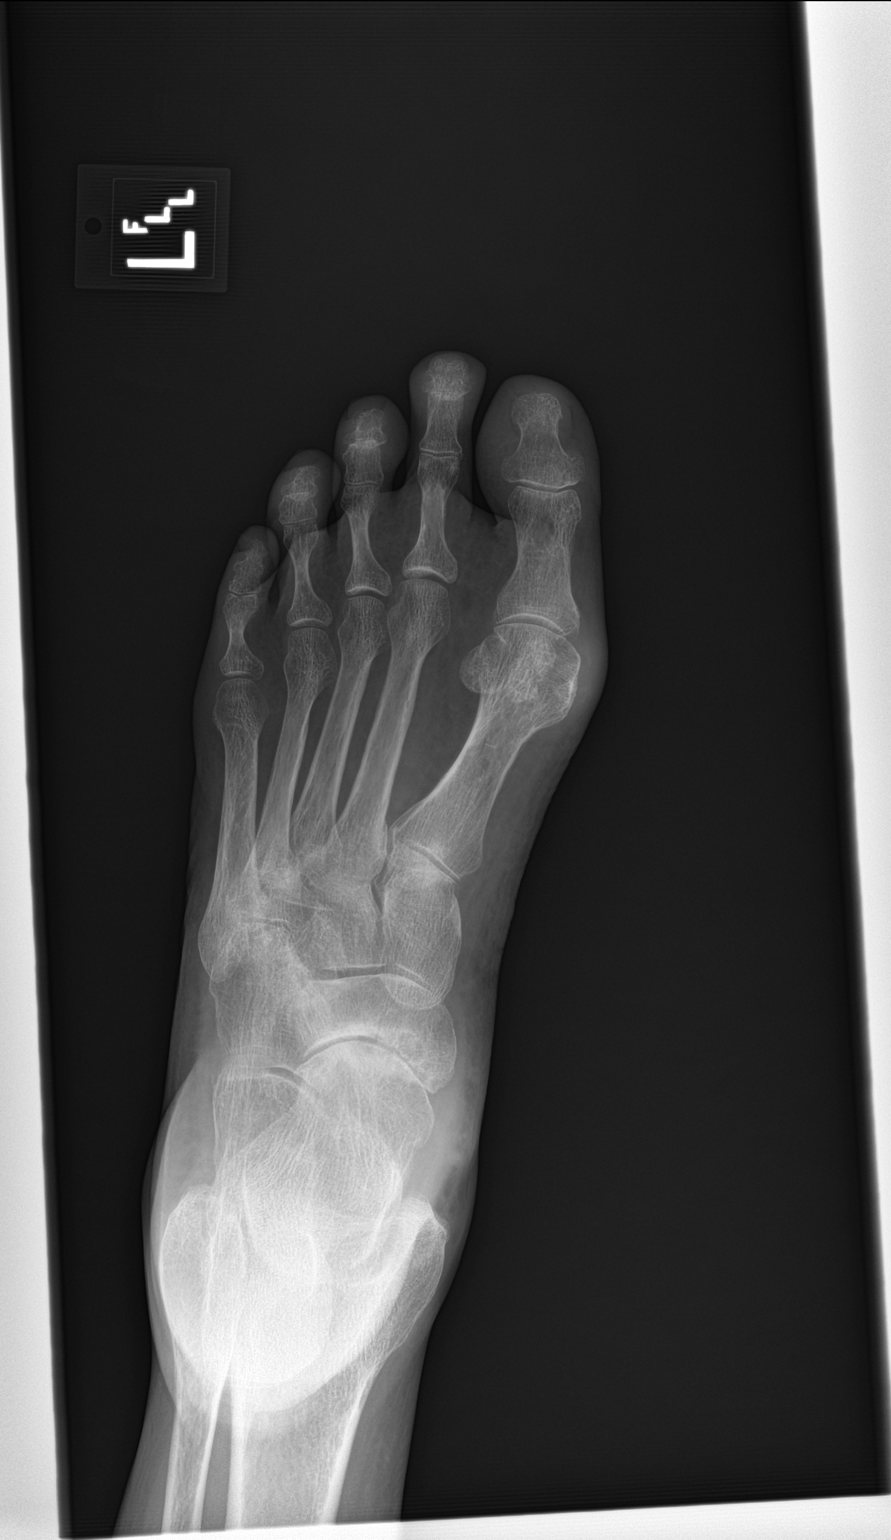

[foot obl]
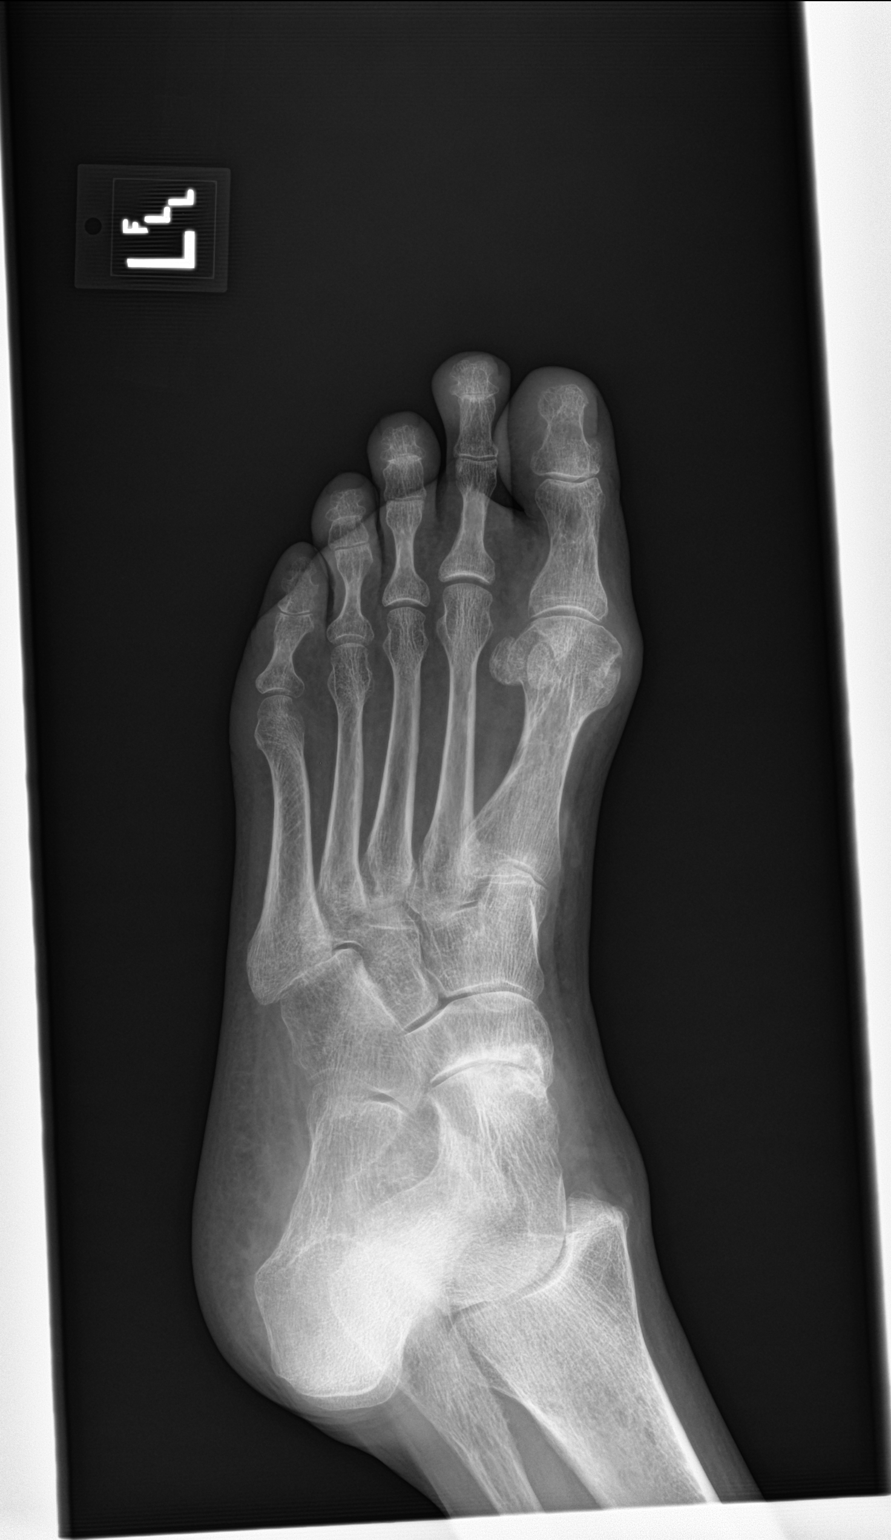

[foot lat]
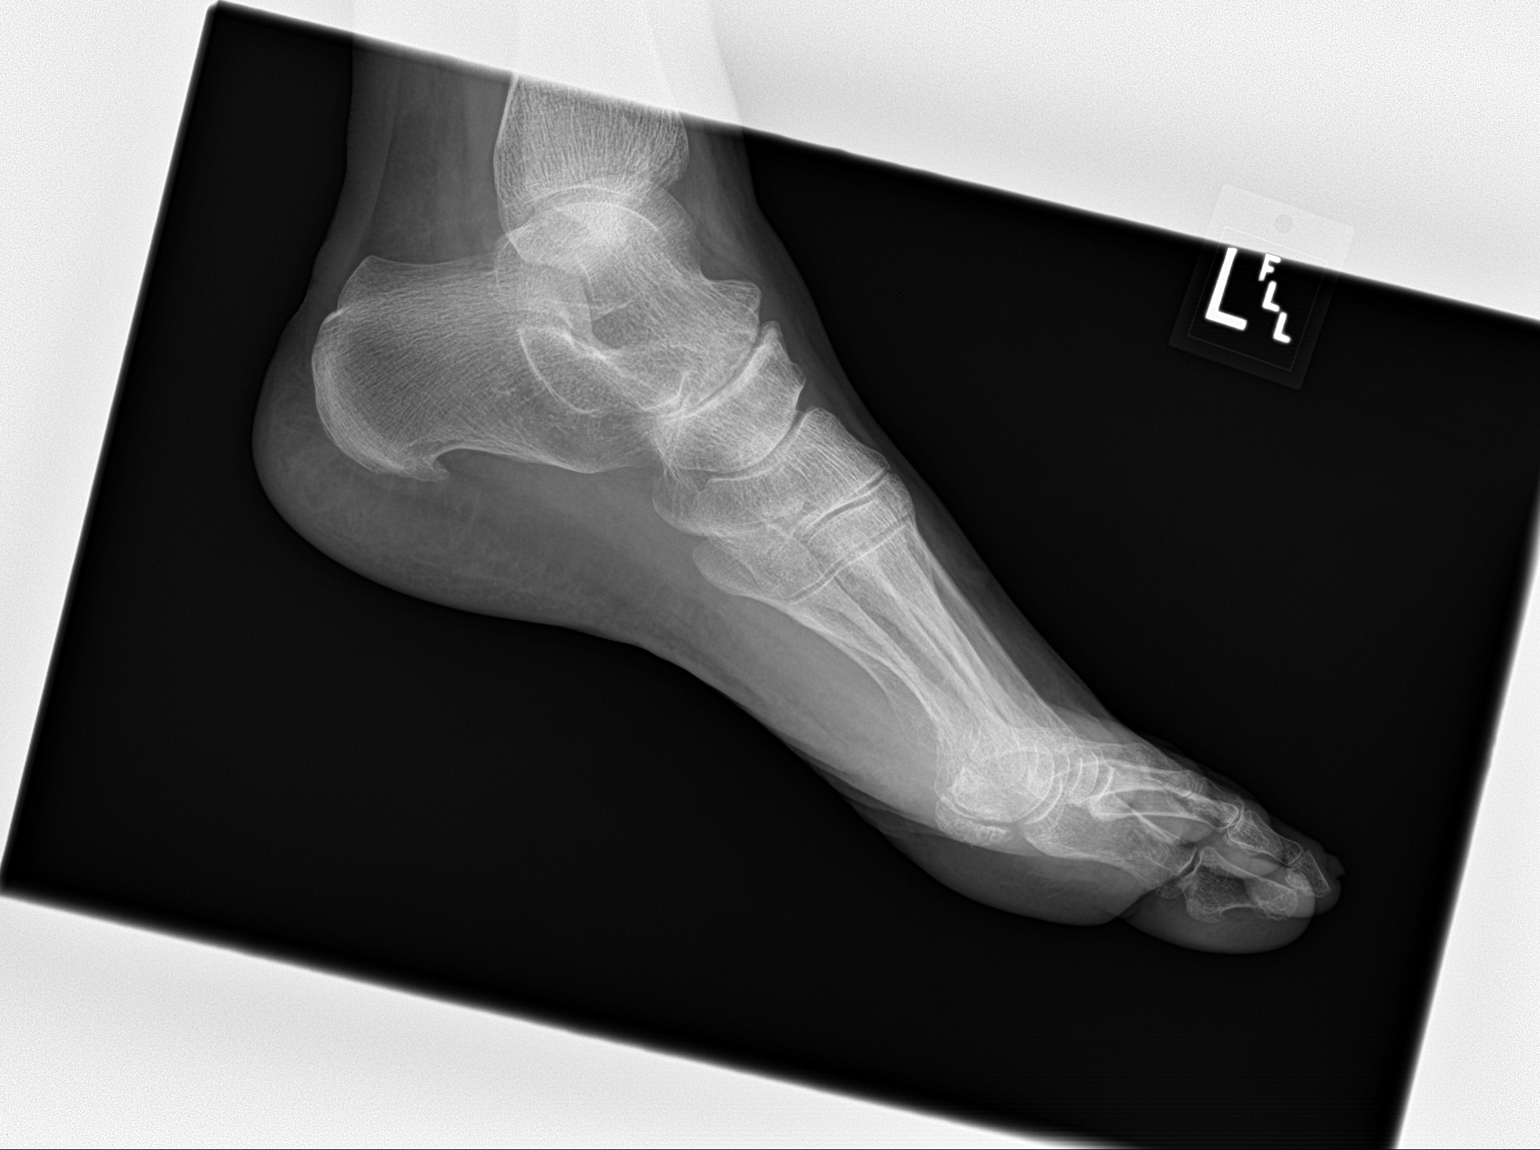

[3 of 3 positions shown; findings below may reference images not displayed]

FINDINGS: No evidence for an acute fracture. No subluxation or dislocation.
Mild degenerative changes without valgus deformity noted at the MTP
joint of the great toe. Small spur noted on the plantar aspect
calcaneal tuberosity. Bones are diffusely demineralized.
IMPRESSION: Negative.

## 2015-02-04 IMAGING — DX DG ANKLE COMPLETE 3+V*L*
3 series · 3 of 3 positions shown · non-contrast
Comparison: None.

CLINICAL DATA: Initial encounter for 2 day history of pain with
redness and tenderness to touch. No history of injury.

EXAM:
LEFT ANKLE COMPLETE - 3+ VIEW

[ankle ap]
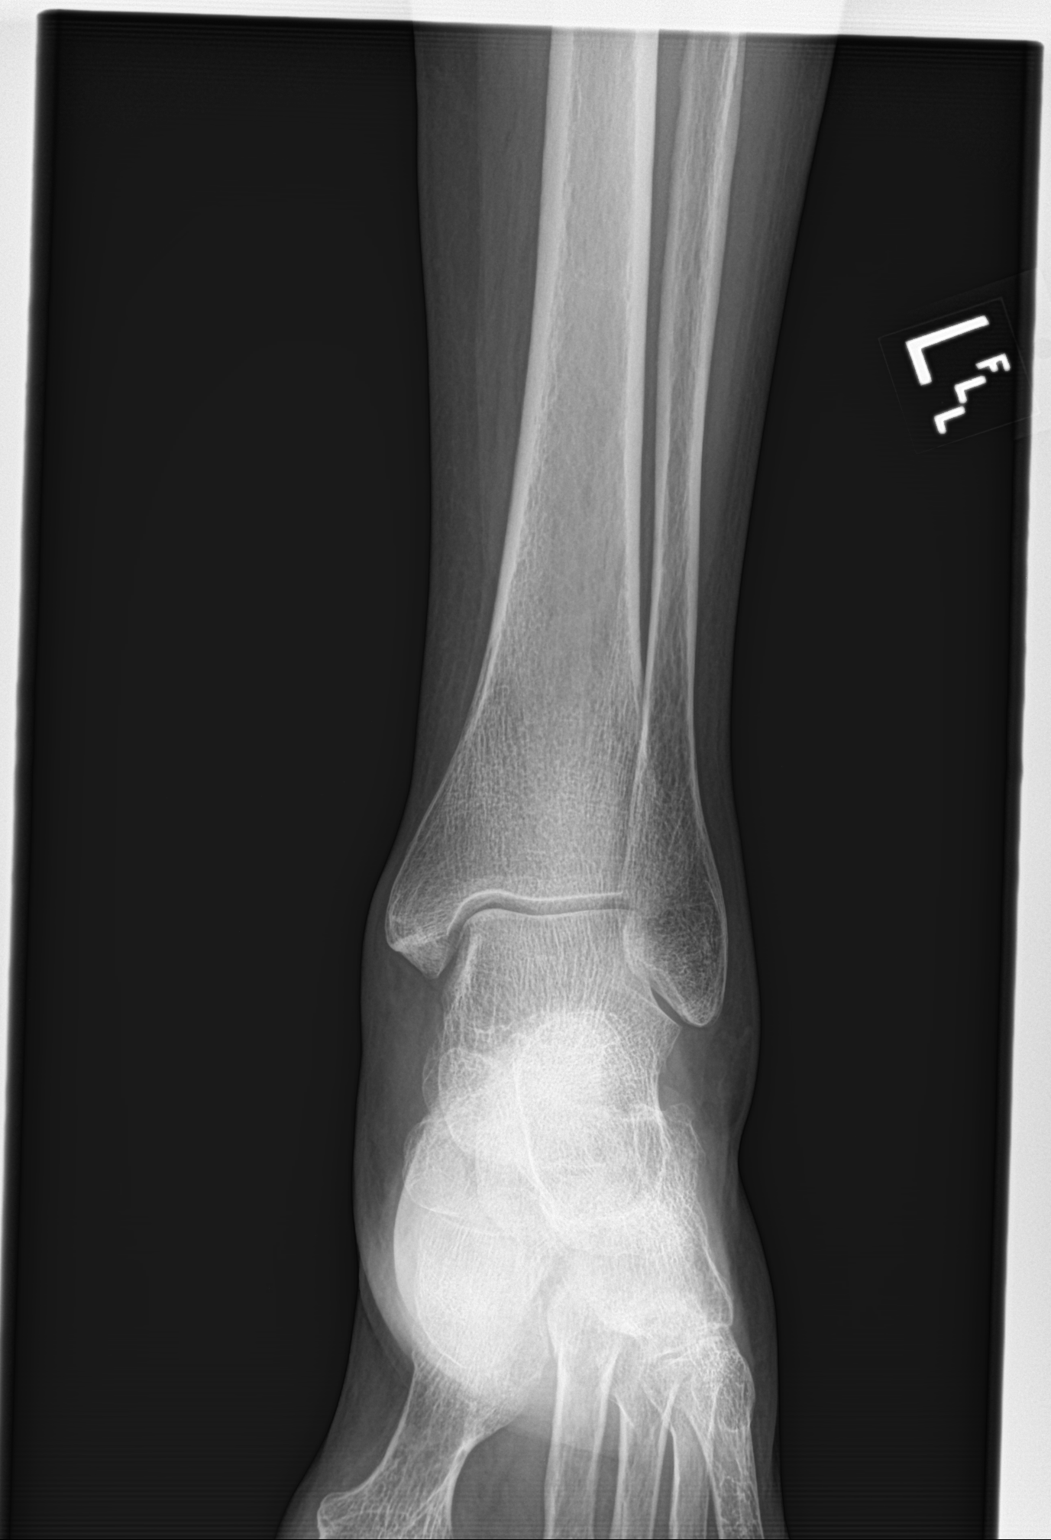

[ankle obl]
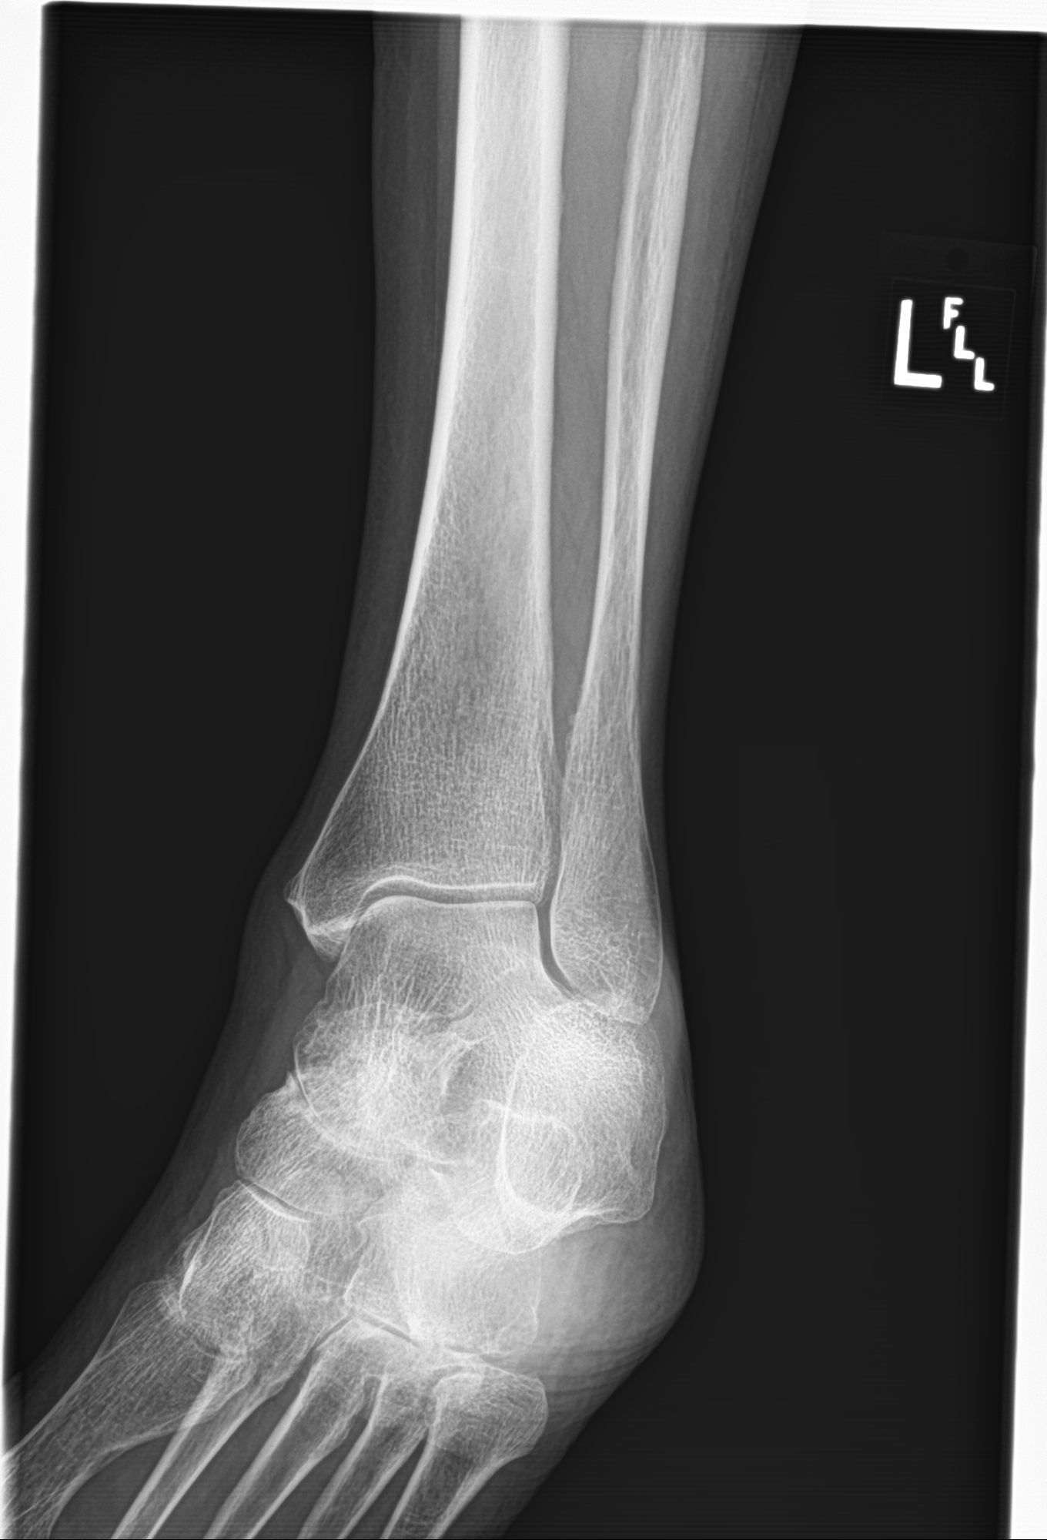

[ankle lat]
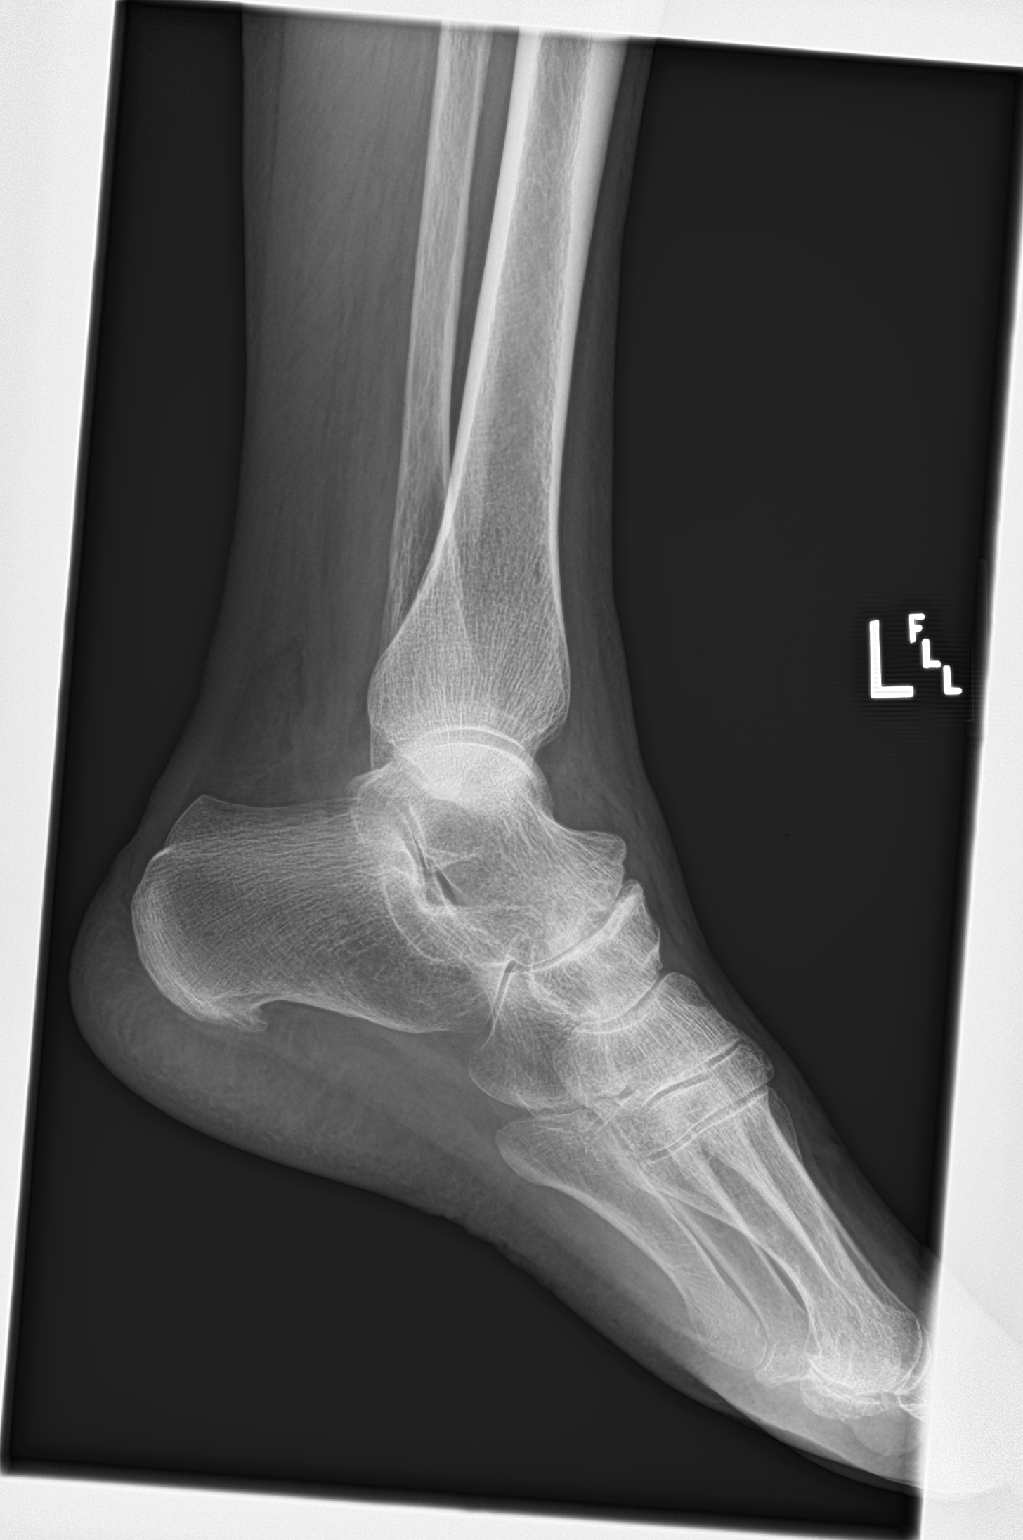

[3 of 3 positions shown; findings below may reference images not displayed]

FINDINGS: There is no evidence of fracture, dislocation, or joint effusion.
There is no evidence of arthropathy or other focal bone abnormality.
Soft tissues are unremarkable.
IMPRESSION: Negative.

## 2015-02-06 ENCOUNTER — Telehealth: Payer: Self-pay | Admitting: Cardiovascular Disease

## 2015-02-06 ENCOUNTER — Telehealth: Payer: Self-pay | Admitting: Family Medicine

## 2015-02-06 NOTE — Telephone Encounter (Signed)
Routed to Dr. Claiborne Billings as Juluis Rainier

## 2015-02-06 NOTE — Telephone Encounter (Signed)
Spoke with patient and advised her that Dr. Claiborne Billings says is OK to hold zetia for 1 week to see if symptoms improve - if she decides to do this, she will call and let us know if her symptoms have improved.

## 2015-02-06 NOTE — Telephone Encounter (Signed)
If these are occurring when she is trying to sleep aaand improved with the urge to move ? Restless legs. F/u with primary MD. Doubt due to zetia, but can try holding for a week to see if any change.

## 2015-02-06 NOTE — Telephone Encounter (Signed)
Returned call to patient. She states for past 3 nights she has had cramping in her legs and feet so bad she cannot sleep. She has to get up and wrap them in hot towels which alleviates cramping, but then when she tries to go back to bed, the cramps return. Patient c/o joint pain - in hips, arms, knees. She states she hurt in her chest for 2 nights and could "hear herself rattling in her left side" when breathing. She denies LE swelling. She denies pain in her legs when she walks. She states she has tried home remedies for cramps - mustard, vinegar, bananas - with no relief. She has tried CoQ10 and this has not helped. She states she noticed her cramps getting worse since she started zetia - I advised patient that generally zetia does not cause SE of cramping.   Patient states she was recommended by someone to try tonic water with quinine - I advised patient to consult with pharmacist to review her medications to see if would be appropriate to take with her medications or advise her on other OTC remedies or supplements. She was also advised to consult PCP on this matter. She voiced understanding.

## 2015-02-06 NOTE — Telephone Encounter (Signed)
Pt says she is having cramps in her legs and feet,have not been able to sleep for the last 3 nights. She needs something for this asap.[facility] [phone].

## 2015-02-06 NOTE — Telephone Encounter (Signed)
Oxycodone last filled 2/17 #120  Hydrocodone 3/15 #120

## 2015-02-06 NOTE — Telephone Encounter (Signed)
HYDROcodone-acetaminophen (NORCO/VICODIN) 5-325 MG per tablet   Pt states she will run out of this med after the 15th of April She feels she needs another refill on this due to not being on the schedule  Till early May. She states she is in severe cramping an pain, using more  Than she is supposed to.   Or do you want her to come in earlier than May?

## 2015-02-07 MED ORDER — HYDROCODONE-ACETAMINOPHEN 5-325 MG PO TABS: 1.0000 | ORAL_TABLET | Freq: Four times a day (QID) | ORAL | Status: DC | PRN

## 2015-02-07 NOTE — Telephone Encounter (Signed)
Rx up front for patient pick up. Patient notified. 

## 2015-02-07 NOTE — Telephone Encounter (Signed)
Patient may have a prescription for pain medicine #120 hydrocodone use 1 4 times a day as needed maximum 4 per day may fill April 14. Recommend office visit in April.

## 2015-02-09 ENCOUNTER — Other Ambulatory Visit: Payer: Self-pay | Admitting: Cardiovascular Disease

## 2015-02-09 NOTE — Telephone Encounter (Signed)
Rx refill sent to patient pharmacy   

## 2015-02-10 ENCOUNTER — Ambulatory Visit: Payer: Medicare Other | Admitting: Family Medicine

## 2015-02-21 ENCOUNTER — Ambulatory Visit (INDEPENDENT_AMBULATORY_CARE_PROVIDER_SITE_OTHER): Payer: Medicare Other | Admitting: Family Medicine

## 2015-02-21 ENCOUNTER — Encounter: Payer: Self-pay | Admitting: Family Medicine

## 2015-02-21 VITALS — BP 128/82 | Ht 63.0 in | Wt 148.2 lb

## 2015-02-21 DIAGNOSIS — G8929 Other chronic pain: Secondary | ICD-10-CM | POA: Diagnosis not present

## 2015-02-21 DIAGNOSIS — I255 Ischemic cardiomyopathy: Secondary | ICD-10-CM

## 2015-02-21 DIAGNOSIS — M25569 Pain in unspecified knee: Secondary | ICD-10-CM | POA: Diagnosis not present

## 2015-02-21 DIAGNOSIS — M549 Dorsalgia, unspecified: Secondary | ICD-10-CM

## 2015-02-21 DIAGNOSIS — R252 Cramp and spasm: Secondary | ICD-10-CM

## 2015-02-21 DIAGNOSIS — M25559 Pain in unspecified hip: Secondary | ICD-10-CM

## 2015-02-21 MED ORDER — HYDROCODONE-ACETAMINOPHEN 5-325 MG PO TABS: 1.0000 | ORAL_TABLET | Freq: Four times a day (QID) | ORAL | Status: DC | PRN

## 2015-02-21 NOTE — Progress Notes (Signed)
   Subjective:    Patient ID: Elizabeth Aguilar, female    DOB: Jan 24, 1941, 74 y.o.   MRN: [id number]  HPI  Patient arrives for a follow up on hydrocodone. Patient also having a lot of problems with cramps in legs and back pain. Patient also had return of painful breathing over the weekend and is having pain in left ankle. She describes joint pains soreness in the ribs and muscles in her back hurts to take breath  Denies cough wheezing fever difficulty breathing otherwise relates a lot of joint pain and discomfort pain medicine seems to help Review of Systems  Constitutional: Negative for activity change, appetite change and fatigue.  HENT: Negative for congestion.   Respiratory: Positive for shortness of breath. Negative for cough.   Cardiovascular: Positive for chest pain.  Gastrointestinal: Negative for abdominal pain.  Endocrine: Negative for polydipsia and polyphagia.  Neurological: Negative for weakness.  Psychiatric/Behavioral: Negative for confusion.       Objective:   Physical Exam  Constitutional: She appears well-nourished. No distress.  Cardiovascular: Normal rate, regular rhythm and normal heart sounds.   No murmur heard. Pulmonary/Chest: Effort normal and breath sounds normal. No respiratory distress.  Musculoskeletal: She exhibits no edema.  Lymphadenopathy:    She has no cervical adenopathy.  Neurological: She is alert. She exhibits normal muscle tone.  Psychiatric: Her behavior is normal.  Vitals reviewed.         Assessment & Plan:  1. Chronic back pain She denies abusing the pain medicine 3 prescriptions given warning signs discuss  2. 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 Muscle cramps Regular stretching exercises recommended lab work ordered as well - TSH - Basic metabolic panel

## 2015-02-22 ENCOUNTER — Encounter: Payer: Self-pay | Admitting: Family Medicine

## 2015-02-22 LAB — BASIC METABOLIC PANEL
BUN / CREAT RATIO: 17 (ref 11–26)
BUN: 17 mg/dL (ref 8–27)
CALCIUM: 9.6 mg/dL (ref 8.7–10.3)
CO2: 24 mmol/L (ref 18–29)
Chloride: 101 mmol/L (ref 97–108)
Creatinine, Ser: 1 mg/dL (ref 0.57–1.00)
GFR calc Af Amer: 64 mL/min/{1.73_m2} (ref >59)
GFR, EST NON AFRICAN AMERICAN: 56 mL/min/{1.73_m2} — AB (ref >59)
Glucose: 131 mg/dL — ABNORMAL HIGH (ref 65–99)
Potassium: 3.9 mmol/L (ref 3.5–5.2)
SODIUM: 142 mmol/L (ref 134–144)

## 2015-02-22 LAB — TSH: TSH: 1.79 u[IU]/mL (ref 0.450–4.500)

## 2015-03-06 ENCOUNTER — Ambulatory Visit: Payer: Medicare Other | Admitting: Family Medicine

## 2015-03-15 ENCOUNTER — Ambulatory Visit: Payer: Medicare Other | Admitting: Family Medicine

## 2015-03-20 DIAGNOSIS — M25571 Pain in right ankle and joints of right foot: Secondary | ICD-10-CM | POA: Diagnosis not present

## 2015-03-20 DIAGNOSIS — M545 Low back pain: Secondary | ICD-10-CM | POA: Diagnosis not present

## 2015-03-20 DIAGNOSIS — Z79899 Other long term (current) drug therapy: Secondary | ICD-10-CM | POA: Diagnosis not present

## 2015-03-20 DIAGNOSIS — M25561 Pain in right knee: Secondary | ICD-10-CM | POA: Diagnosis not present

## 2015-03-20 DIAGNOSIS — R748 Abnormal levels of other serum enzymes: Secondary | ICD-10-CM | POA: Diagnosis not present

## 2015-03-20 DIAGNOSIS — M25551 Pain in right hip: Secondary | ICD-10-CM | POA: Diagnosis not present

## 2015-03-20 DIAGNOSIS — M25572 Pain in left ankle and joints of left foot: Secondary | ICD-10-CM | POA: Diagnosis not present

## 2015-03-20 DIAGNOSIS — M25562 Pain in left knee: Secondary | ICD-10-CM | POA: Diagnosis not present

## 2015-03-20 DIAGNOSIS — M2550 Pain in unspecified joint: Secondary | ICD-10-CM | POA: Diagnosis not present

## 2015-03-28 DIAGNOSIS — M1A00X0 Idiopathic chronic gout, unspecified site, without tophus (tophi): Secondary | ICD-10-CM | POA: Diagnosis not present

## 2015-03-28 DIAGNOSIS — M25571 Pain in right ankle and joints of right foot: Secondary | ICD-10-CM | POA: Diagnosis not present

## 2015-03-28 DIAGNOSIS — M1000 Idiopathic gout, unspecified site: Secondary | ICD-10-CM | POA: Diagnosis not present

## 2015-04-07 ENCOUNTER — Other Ambulatory Visit: Payer: Self-pay | Admitting: Family Medicine

## 2015-05-04 ENCOUNTER — Other Ambulatory Visit: Payer: Self-pay | Admitting: Family Medicine

## 2015-05-04 NOTE — Telephone Encounter (Signed)
May refill each with four additional refill

## 2015-05-22 ENCOUNTER — Encounter: Payer: Self-pay | Admitting: Family Medicine

## 2015-05-22 ENCOUNTER — Ambulatory Visit (INDEPENDENT_AMBULATORY_CARE_PROVIDER_SITE_OTHER): Payer: Medicare Other | Admitting: Family Medicine

## 2015-05-22 VITALS — BP 130/80 | Ht 63.0 in | Wt 146.8 lb

## 2015-05-22 DIAGNOSIS — M25559 Pain in unspecified hip: Secondary | ICD-10-CM | POA: Diagnosis not present

## 2015-05-22 DIAGNOSIS — G8929 Other chronic pain: Secondary | ICD-10-CM | POA: Diagnosis not present

## 2015-05-22 DIAGNOSIS — M549 Dorsalgia, unspecified: Secondary | ICD-10-CM | POA: Diagnosis not present

## 2015-05-22 DIAGNOSIS — M25569 Pain in unspecified knee: Secondary | ICD-10-CM

## 2015-05-22 DIAGNOSIS — Z79891 Long term (current) use of opiate analgesic: Secondary | ICD-10-CM | POA: Diagnosis not present

## 2015-05-22 DIAGNOSIS — M8580 Other specified disorders of bone density and structure, unspecified site: Secondary | ICD-10-CM | POA: Diagnosis not present

## 2015-05-22 DIAGNOSIS — E119 Type 2 diabetes mellitus without complications: Secondary | ICD-10-CM

## 2015-05-22 DIAGNOSIS — I255 Ischemic cardiomyopathy: Secondary | ICD-10-CM | POA: Diagnosis not present

## 2015-05-22 DIAGNOSIS — I10 Essential (primary) hypertension: Secondary | ICD-10-CM

## 2015-05-22 LAB — POCT GLYCOSYLATED HEMOGLOBIN (HGB A1C): Hemoglobin A1C: 5.9

## 2015-05-22 MED ORDER — HYDROCODONE-ACETAMINOPHEN 5-325 MG PO TABS: 1.0000 | ORAL_TABLET | Freq: Four times a day (QID) | ORAL | Status: DC | PRN

## 2015-05-22 NOTE — Progress Notes (Signed)
   Subjective:    Patient ID: Elizabeth Aguilar, female    DOB: 09-13-1941, 74 y.o.   MRN: [id number]  HPI This patient was seen today for chronic pain  The medication list was reviewed and updated.   -Compliance with pain medication: yes  The patient was advised the importance of maintaining medication and not using illegal substances with these.  Refills needed: yes  The patient was educated that we can provide 3 monthly scripts for their medication, it is their responsibility to follow the instructions.  Side effects or complications from medications: none  Patient is aware that pain medications are meant to minimize the severity of the pain to allow their pain levels to improve to allow for better function. They are aware of that pain medications cannot totally remove their pain.  Due for UDT ( at least once per year) : today  Patient relates compliance with her medicines. She takes 3-4 per day. She states her allergies doing well with Flonase Reflux doing well with taking Nexium daily States heart condition is stable She does try to watch her diet and has been taking her metformin and her cholesterol medicine She states she does uses Xanax when necessary for anxiousness She also takes her Fosamax weekly for her osteoporosis      Review of Systems    she relates lumbar pain discomfort denies chest tightness pressure pain shortness breath nausea vomiting diarrhea denies fever chills Objective:   Physical Exam Neck no masses lungs are clear heart regular pulse normal extremities no edema skin warm dry lumbar area subjected discomfort arthritic changes noted in the hands and knees       Assessment & Plan:  Chronic pain-long discussion held with patient regarding safety of medications. Pain medicine agreement filled out. Took over 40 minutes for the patient to collect a urine.  Diabetes-A1c control looks good. Continue current measures  Blood pressure good  control  Allergic rhinitis Flonase when necessary  Osteoporosis continue Fosamax

## 2015-05-26 ENCOUNTER — Telehealth: Payer: Self-pay | Admitting: *Deleted

## 2015-05-26 ENCOUNTER — Ambulatory Visit (INDEPENDENT_AMBULATORY_CARE_PROVIDER_SITE_OTHER): Payer: Medicare Other | Admitting: Cardiovascular Disease

## 2015-05-26 ENCOUNTER — Encounter: Payer: Self-pay | Admitting: *Deleted

## 2015-05-26 ENCOUNTER — Encounter: Payer: Self-pay | Admitting: Cardiovascular Disease

## 2015-05-26 VITALS — BP 140/60 | HR 77 | Ht 64.0 in | Wt 145.0 lb

## 2015-05-26 DIAGNOSIS — E119 Type 2 diabetes mellitus without complications: Secondary | ICD-10-CM

## 2015-05-26 DIAGNOSIS — K219 Gastro-esophageal reflux disease without esophagitis: Secondary | ICD-10-CM | POA: Insufficient documentation

## 2015-05-26 DIAGNOSIS — I10 Essential (primary) hypertension: Secondary | ICD-10-CM | POA: Diagnosis not present

## 2015-05-26 DIAGNOSIS — I255 Ischemic cardiomyopathy: Secondary | ICD-10-CM | POA: Diagnosis not present

## 2015-05-26 DIAGNOSIS — E785 Hyperlipidemia, unspecified: Secondary | ICD-10-CM

## 2015-05-26 MED ORDER — METOPROLOL SUCCINATE ER 100 MG PO TB24: 100.0000 mg | ORAL_TABLET | Freq: Every day | ORAL | Status: DC

## 2015-05-26 MED ORDER — TICAGRELOR 60 MG PO TABS: 60.0000 mg | ORAL_TABLET | Freq: Two times a day (BID) | ORAL | Status: DC

## 2015-05-26 NOTE — Telephone Encounter (Signed)
Faxed a letter to [facility] transportation in [place] per patient's request stating that she was seen  in the office today by Dr. Claiborne Billings. Letter was faxed to number provided by the patient. [phone]).

## 2015-05-26 NOTE — Progress Notes (Signed)
Patient ID: Elizabeth Aguilar, female   DOB: 11/09/1941, 74 y.o.   MRN: [id number]    HPI: Elizabeth Aguilar is a 74 y.o. female who presents for 4 month cardiology follow-up evaluation.    Ms. Juma suffered an ST segment elevation myocardial infarction and was transported from [facility] on 12/16/2013 in the setting of anterolateral MI.  She was taken acutely to the catheterization laboratory, which showed acute LV dysfunction with an ejection fraction of 35-40% with severe hypokinesis involving the mid distal anterolateral wall, apex, and extending to involve the apical, inferior segment.  She had 2+ angiographic mitral regurgitation.  She underwent successful stenting of her total occluded LAD and a 3.0x24 mm Promus premier DES stent was inserted. Due to high grade concomitant CAD, she was brought back to the cardiac catheterization laboratory the following day where LAD stent was widely patent.  Her high-grade focal RCA stenosis of greater than 90% was successfully treated with with ultimate stenting with a 3.0x24 mm Promus premier DES stent, postdilated to 3.26 mm.  She has been on aspirin/Brilinta for dual antiplatelet therapy.  She was discharged with a Life-vest, February 7 she did have a 5 beat run of VT. Her pre-discharge her ejection fraction was 25%.  When I saw her in March 2015, her resting pulse was 104 beats per minute, and I further titrated Toprol-XL to 100 mg in the morning and 50 mg at night.  She  felt improved on this increase beta blocker regimen.   An echo Doppler study on 03/03/2014 demonstrated improved LV function  from 25% to 40% and there was evidence for residual hypokinesis of the distal left ventricle with akinesis of the apex.  Normal LV cavity size.  There was grade 1 diastolic dysfunction.    With improvement in her LV function, we discontinued her lifevest.  She  developed a significant GI illness  ago and lost proximally 10-12 pounds.  At that time, her blood pressure  became low and she was taken off her spironolactone and apparently her metoprolol succinate was reduced to just 50 mg.   She was hospitalized from November 30 through 10/24/2014 with pericarditis.  An echo Doppler study done in the hospital showed a moderate free-flowing pericardial effusion circumferential to the heart, without evidence for hemodynamic compromise.  Ejection fraction was 45-50%.  There was grade 1 diastolic dysfunction.  Since she was on dual antiplatelet therapy, she was treated with colchicine and not nonsteroidal anti-inflammatory agents.  She subsequently developed diarrhea with colchicine.  She has been maintained on dual antiplatelet therapy.  She is diabetic.  She has a history of hyperlipidemia and has been taking atorvastatin 40 mg.  Since I last saw her, she underwent a follow-up echo Doppler study on 01/23/2015.  This showed her ejection fraction had increased to 55-60%.  She did not have any regional wall motion abnormalities.  There was grade 1 diastolic dysfunction.  There was no evidence for any residual pericardial effusion.  She denies chest pain.  She denies bleeding.  At times she does note an occasional palpitation.  She denies presyncope or syncope.  Past Medical History  Diagnosis Date  . Irritable bowel syndrome   . Hyperlipidemia   . Type 2 diabetes mellitus   . CAD (coronary artery disease)     a.  s/p STEMI s/p DES to LAD and RCA (12/2013)  . Pericarditis   . Chronic diastolic CHF (congestive heart failure)   . Chest pain 10/10/2014  Past Surgical History  Procedure Laterality Date  . Coronary angioplasty with stent placement    . Left heart catheterization with coronary angiogram N/A 12/16/2013    Procedure: LEFT HEART CATHETERIZATION WITH CORONARY ANGIOGRAM;  Surgeon: Troy Sine, MD;  Location: [facility] CATH LAB;  Service: Cardiovascular;  Laterality: N/A;  . Percutaneous coronary stent intervention (pci-s) N/A 12/17/2013    Procedure: PERCUTANEOUS  CORONARY STENT INTERVENTION (PCI-S);  Surgeon: Troy Sine, MD;  Location: [facility] CATH LAB;  Service: Cardiovascular;  Laterality: N/A;  . Left heart catheterization with coronary angiogram  12/17/2013    Procedure: LEFT HEART CATHETERIZATION WITH CORONARY ANGIOGRAM;  Surgeon: Troy Sine, MD;  Location: [facility] CATH LAB;  Service: Cardiovascular;;    Allergies  Allergen Reactions  . Actos [Pioglitazone] Swelling  . Ceftin [Cefuroxime Axetil] Nausea Only  . Celebrex [Celecoxib]     Sickness  . Darvocet [Propoxyphene N-Acetaminophen] Nausea Only  . Doxycycline Nausea And Vomiting and Other (See Comments)    Bones ache  . Erythromycin Nausea And Vomiting  . Sulfa Antibiotics Nausea And Vomiting and Other (See Comments)    Bones ache   . Vioxx [Rofecoxib]     Sickness   . Zetia [Ezetimibe]     Side effects,cramps   . Zithromax [Azithromycin] Nausea Only  . Zocor [Simvastatin]     Side effects    Current Outpatient Prescriptions  Medication Sig Dispense Refill  . allopurinol (ZYLOPRIM) 100 MG tablet Take 2 tablets (200 mg total) by mouth daily. 60 tablet 5  . ALPRAZolam (XANAX) 0.5 MG tablet TAKE ONE TABLET TWICE DAILY AS NEEDED 60 tablet 4  . aspirin EC 81 MG EC tablet Take 1 tablet (81 mg total) by mouth daily.    [name] atorvastatin (LIPITOR) 40 MG tablet Take 1 tablet (40 mg total) by mouth daily at 6 PM. 30 tablet 12  . diphenoxylate-atropine (LOMOTIL) 2.5-0.025 MG per tablet Take 1 tablet by mouth 3 (three) times daily as needed for diarrhea or loose stools. 20 tablet 0  . esomeprazole (NEXIUM) 40 MG capsule Take 1 capsule (40 mg total) by mouth every morning. 30 capsule 12  . fluticasone (FLONASE) 50 MCG/ACT nasal spray USE 2 SPRAYS IN EACH NOSTRIL DAILY 16 g 4  . hydrochlorothiazide (HYDRODIURIL) 25 MG tablet Take 0.5 tablets (12.5 mg total) by mouth daily. 15 tablet 12  . HYDROcodone-acetaminophen (NORCO/VICODIN) 5-325 MG per tablet Take 1 tablet by mouth every 6 (six) hours as  needed. 120 tablet 0  . ketorolac (ACULAR) 0.5 % ophthalmic solution INSTILL 1 DROP IN BOTH EYES FOUR TIMES ADAY 5 mL 11  . lisinopril (PRINIVIL,ZESTRIL) 2.5 MG tablet TAKE ONE (1) TABLET EACH DAY 30 tablet 5  . loratadine (CLARITIN) 10 MG tablet Take 1 tablet (10 mg total) by mouth daily. 30 tablet 12  . metFORMIN (GLUCOPHAGE) 500 MG tablet Take 1 tablet (500 mg total) by mouth 2 (two) times daily with a meal. 60 tablet 12  . Multiple Vitamin (MULTIVITAMIN) tablet Take 1 tablet by mouth daily.    . nitroGLYCERIN (NITROSTAT) 0.4 MG SL tablet Place 1 tablet (0.4 mg total) under the tongue every 5 (five) minutes x 3 doses as needed for chest pain. 25 tablet 12  . ondansetron (ZOFRAN-ODT) 8 MG disintegrating tablet Take 1 tablet (8 mg total) by mouth every 8 (eight) hours as needed for nausea or vomiting. 21 tablet 6  . vitamin C (ASCORBIC ACID) 500 MG tablet Take 500 mg by mouth daily.    [name]  metoprolol succinate (TOPROL XL) 100 MG 24 hr tablet Take 1 tablet (100 mg total) by mouth daily. Take with or immediately following a meal. 30 tablet 11  . ticagrelor (BRILINTA) 60 MG TABS tablet Take 1 tablet (60 mg total) by mouth 2 (two) times daily. 60 tablet 11   No current facility-administered medications for this visit.    History   Social History  . Marital Status: Widowed    Spouse Name: N/A  . Number of Children: N/A  . Years of Education: N/A   Occupational History  . Not on file.   Social History Main Topics  . Smoking status: Never Smoker   . Smokeless tobacco: Not on file  . Alcohol Use: No  . Drug Use: No  . Sexual Activity: Not on file   Other Topics Concern  . Not on file   Social History Narrative   Socially, she is widowed for 8 years.  She has 4 children and 3 grandchildren.  She completed eighth grade of education.  There is no history of tobacco use.  Family History  Problem Relation Age of Onset  . Diabetes Mother   . Diabetes Daughter   . Diabetes Son      ROS General: Negative; No fevers, chills, or night sweats;  HEENT:  Positive for recent tooth abscess for which he did undergo antibiotic therapy.  no changes in vision or hearing, sinus congestion, difficulty swallowing Pulmonary: Negative; No cough, wheezing, shortness of breath, hemoptysis Cardiovascular: Negative; No chest pain, presyncope, syncope, palpatations GI: Recent GI illness, resolved ; No nausea, vomiting, diarrhea, or abdominal pain GU: Negative; No dysuria, hematuria, or difficulty voiding Musculoskeletal: Positive for occasional arthralgias of her knees and hips; no myalgias, or weakness Hematologic/Oncology: Negative; no easy bruising, bleeding Endocrine: Positive for type 2 diabetes mellitus.; no heat/cold intolerance Neuro: Negative; no changes in balance, headaches Skin: Negative; No rashes or skin lesions Psychiatric: Negative; No behavioral problems, depression Sleep: Negative; No snoring, daytime sleepiness, hypersomnolence, bruxism, restless legs, hypnogognic hallucinations, no cataplexy Other comprehensive 14 point system review is negative   PE BP 140/60 mmHg  Pulse 77  Ht 5\' 4"  (1.626 m)  Wt 145 lb (65.772 kg)  BMI 24.88 kg/m2   Wt Readings from Last 3 Encounters:  05/26/15 145 lb (65.772 kg)  05/22/15 146 lb 12.8 oz (66.588 kg)  02/21/15 148 lb 3.2 oz (67.223 kg)   General: Alert, oriented, no distress.  Skin: normal turgor, no rashes HEENT: Normocephalic, atraumatic. Pupils round and reactive; sclera anicteric;no lid lag. Extraocular muscles intact;; no xanthelasmas. Nose without nasal septal hypertrophy Mouth/Parynx benign; Mallinpatti scale 3 Neck: No JVD, no carotid bruits; normal carotid upstroke Lungs: clear to ausculatation and percussion; no wheezing or rales Chest wall: no tenderness to palpitation Heart: RRR, s1 s2 normal; 1/6 systolic murmur.  no diastolic murmur, rub thrills or heaves Abdomen: soft, nontender; no hepatosplenomehaly,  BS+; abdominal aorta nontender and not dilated by palpation. Back: no CVA tenderness Pulses 2+ Extremities: no clubbing cyanosis or edema, Homan's sign negative  Neurologic: grossly nonfocal; cranial nerves grossly normal. Psychologic: normal affect and mood.  ECG (independently read by me): Normal sinus rhythm at 77 bpm.  No ectopy.  Normal intervals.  March 2016 ECG (independently read by me): Normal sinus rhythm at 86 bpm.  Nonspecific T changes.  Normal intervals.  07/19/2014 ECG (independently read by me): Sinus rhythm at 87 beats per minute.  Isolated PVC.  QTc interval 460 ms.  Nonspecific ST changes.  03/14/2014 ECG (independently read by me): Sinus rhythm at 87 beats per minute.  Preserved anterior R waves, but with corneal T-wave inversion  Prior 01/31/2014 ECG (independently read by me): Sinus rhythm at 104 beats per minute with T-wave inversion anterolaterally V2 to V6, as well as lead 1 and L., concordant with her prior myocardial infarction.  LABS:  BMET  BMP Latest Ref Rng 02/21/2015 10/13/2014 10/12/2014  Glucose 65 - 99 mg/dL 131(H) 207(H) 121(H)  BUN 8 - 27 mg/dL 17 21 19   Creatinine 0.57 - 1.00 mg/dL 1.00 1.50(H) 1.29(H)  BUN/Creat Ratio 11 - 26 17 - -  Sodium 134 - 144 mmol/L 142 138 142  Potassium 3.5 - 5.2 mmol/L 3.9 4.2 3.6(L)  Chloride 97 - 108 mmol/L 101 98 102  CO2 18 - 29 mmol/L 24 26 26   Calcium 8.7 - 10.3 mg/dL 9.6 9.2 8.6     Hepatic Function Panel     Component Value Date/Time   PROT 6.0 10/10/2014 1023   ALBUMIN 2.4* 10/10/2014 1023   AST 11 10/10/2014 1023   ALT 11 10/10/2014 1023   ALKPHOS 114 10/10/2014 1023   BILITOT 0.6 10/10/2014 1023     CBC  CBC Latest Ref Rng 10/13/2014 10/12/2014 10/11/2014  WBC 4.0 - 10.5 K/uL 6.8 7.6 9.7  Hemoglobin 12.0 - 15.0 g/dL 10.9(L) 10.4(L) 10.0(L)  Hematocrit 36.0 - 46.0 % 33.9(L) 31.3(L) 31.5(L)  Platelets 150 - 400 K/uL 456(H) 366 296     BNP    Component Value Date/Time   PROBNP 1454.0*  10/11/2014 0900    Lipid Panel     Component Value Date/Time   CHOL 164 12/23/2014 0918   TRIG 130 12/23/2014 0918   HDL 52 12/23/2014 0918   CHOLHDL 3.2 12/23/2014 0918   VLDL 26 12/23/2014 0918   LDLCALC 86 12/23/2014 0918     RADIOLOGY: No results found.    ASSESSMENT AND PLAN: Ms. Kyan Giannone is a 74 year old female who suffered an ST segment elevation anterolateral myocardial infarction on December 16, 2013 secondary to total occlusion of the LAD.  She had high-grade concomitant CAD and also underwent staged intervention to her RCA.  She has been without recurrent anginal symptoms.  Pre-discharge ejection fraction was 25%, and this has subsequently normalized.  I reviewed with her.  Her most recent echo Doppler assessment, which now confirms normal function.  She status post an episode of pericarditis in December 2015.  Her most recent echo shows complete resolution of her prior pericardial effusion.  She is now over a year following her ST segment elevation MI.  I discussed with her the recent Pegasus trial data.  I will reduce her Brilinta from 90 mg twice a day to 60 mg twice a day and she will continue with the baby aspirin.  Her blood pressure today is controlled.  She admits to occasional palpitations.  I am recommending slight additional titration of her Toprol-XL to 100 mg daily from her present dose of 75 mg.  She is diabetic on metformin.  She has a history of gout, and has been taking chronic allopurinol.  She has GERD but this is controlled with Nexium.  I will see her in 6 months for reevaluation or sooner if problems arise.  Time spent: 25 minutes  KELLY,THOMAS A, MD .05/26/2015 11:44 AM

## 2015-05-26 NOTE — Patient Instructions (Signed)
Your physician has recommended you make the following change in your medication: the metoprolol has been increased to 100 mg daily. The Brilinta has been decreased to 60 mg twice a day.   New prescriptions were sent to your pharmacy to reflect these changes.  Your physician recommends that you schedule a follow-up appointment and lab work in 6 months. A lab order will be mailed to you closer to the appointment.

## 2015-05-27 LAB — TOXASSURE SELECT 13 (MW), URINE: PDF: 0

## 2015-06-06 NOTE — Addendum Note (Signed)
Addended by: Diana Eves on: 06/06/2015 03:01 PM   Modules accepted: Orders

## 2015-06-07 DIAGNOSIS — M2550 Pain in unspecified joint: Secondary | ICD-10-CM | POA: Diagnosis not present

## 2015-06-07 DIAGNOSIS — Z79899 Other long term (current) drug therapy: Secondary | ICD-10-CM | POA: Diagnosis not present

## 2015-06-17 ENCOUNTER — Telehealth: Payer: Self-pay | Admitting: Family Medicine

## 2015-06-17 DIAGNOSIS — D649 Anemia, unspecified: Secondary | ICD-10-CM

## 2015-06-17 NOTE — Telephone Encounter (Signed)
This patient had recent lab work completed through [facility], showed anemia. She needs TIBC, ferritin, heme occult cards 3. Please make sure patient does this at lab core so that it shows up in Epic

## 2015-06-22 NOTE — Telephone Encounter (Signed)
[facility] (lab work ordered and hemoccult cards upfront)

## 2015-06-22 NOTE — Addendum Note (Signed)
Addended by: Jesusita Oka on: 06/22/2015 08:45 AM   Modules accepted: Orders

## 2015-06-22 NOTE — Telephone Encounter (Signed)
Discussed with pt. Pt verbalized understanding.  °

## 2015-06-26 ENCOUNTER — Ambulatory Visit (INDEPENDENT_AMBULATORY_CARE_PROVIDER_SITE_OTHER): Payer: Medicare Other | Admitting: Family Medicine

## 2015-06-26 ENCOUNTER — Encounter: Payer: Self-pay | Admitting: Family Medicine

## 2015-06-26 VITALS — BP 112/68 | Temp 97.9°F | Ht 63.0 in | Wt 144.0 lb

## 2015-06-26 DIAGNOSIS — I255 Ischemic cardiomyopathy: Secondary | ICD-10-CM

## 2015-06-26 DIAGNOSIS — D649 Anemia, unspecified: Secondary | ICD-10-CM | POA: Diagnosis not present

## 2015-06-26 DIAGNOSIS — N951 Menopausal and female climacteric states: Secondary | ICD-10-CM

## 2015-06-26 DIAGNOSIS — R300 Dysuria: Secondary | ICD-10-CM

## 2015-06-26 LAB — POCT URINALYSIS DIPSTICK: pH, UA: 5

## 2015-06-26 MED ORDER — CIPROFLOXACIN HCL 500 MG PO TABS: ORAL_TABLET | ORAL | Status: DC

## 2015-06-26 NOTE — Progress Notes (Signed)
   Subjective:    Patient ID: Elizabeth Aguilar, female    DOB: 07-27-1941, 74 y.o.   MRN: [id number]  Dysuria  This is a new problem. Episode onset: 3 days ago. She has tried increased fluids for the symptoms.   Patient had intermittent sweats she wondered if it could be related to her heart. She denies any chest pain or shortness of breath.  She does state that the sweats seem to correlated with the medicine that her rheumatologist tried she denies severe night sweats denies weight loss   Review of Systems  Constitutional: Negative for activity change, appetite change and fatigue.  HENT: Negative for congestion.   Respiratory: Negative for cough and chest tightness.   Cardiovascular: Negative for chest pain and leg swelling.  Gastrointestinal: Negative for abdominal pain.  Endocrine: Negative for polydipsia and polyphagia.  Genitourinary: Positive for dysuria.  Neurological: Negative for weakness.  Psychiatric/Behavioral: Negative for confusion.       Objective:   Physical Exam  Constitutional: She appears well-nourished. No distress.  Cardiovascular: Normal rate, regular rhythm and normal heart sounds.   No murmur heard. Pulmonary/Chest: Effort normal and breath sounds normal. No respiratory distress.  Musculoskeletal: She exhibits no edema.  Lymphadenopathy:    She has no cervical adenopathy.  Neurological: She is alert. She exhibits normal muscle tone.  Psychiatric: Her behavior is normal.  Vitals reviewed.         Assessment & Plan:  UTI-antibiotics prescribed follow-up if problems  Intermittent sweats she thinks it was related to rheumatology medicine she recently stopped that medicine and it seemed to help this if she starts having reoccurrence she will follow-up

## 2015-06-27 DIAGNOSIS — M170 Bilateral primary osteoarthritis of knee: Secondary | ICD-10-CM | POA: Diagnosis not present

## 2015-06-27 DIAGNOSIS — M1A00X0 Idiopathic chronic gout, unspecified site, without tophus (tophi): Secondary | ICD-10-CM | POA: Diagnosis not present

## 2015-06-27 DIAGNOSIS — D649 Anemia, unspecified: Secondary | ICD-10-CM | POA: Diagnosis not present

## 2015-06-27 DIAGNOSIS — N189 Chronic kidney disease, unspecified: Secondary | ICD-10-CM | POA: Diagnosis not present

## 2015-06-27 LAB — IRON AND TIBC
IRON SATURATION: 23 % (ref 15–55)
Iron: 57 ug/dL (ref 27–139)
Total Iron Binding Capacity: 246 ug/dL — ABNORMAL LOW (ref 250–450)
UIBC: 189 ug/dL (ref 118–369)

## 2015-06-27 LAB — FERRITIN: FERRITIN: 196 ng/mL — AB (ref 15–150)

## 2015-07-05 ENCOUNTER — Other Ambulatory Visit: Payer: Self-pay | Admitting: Family Medicine

## 2015-08-06 ENCOUNTER — Emergency Department (HOSPITAL_COMMUNITY): Payer: Medicare Other

## 2015-08-06 ENCOUNTER — Observation Stay (HOSPITAL_COMMUNITY)
Admission: EM | Admit: 2015-08-06 | Discharge: 2015-08-07 | Disposition: A | Discharge status: Home / Self Care | Payer: Medicare Other | Attending: Internal Medicine | Admitting: Internal Medicine

## 2015-08-06 ENCOUNTER — Encounter (HOSPITAL_COMMUNITY): Payer: Self-pay

## 2015-08-06 DIAGNOSIS — R05 Cough: Secondary | ICD-10-CM | POA: Diagnosis not present

## 2015-08-06 DIAGNOSIS — E1122 Type 2 diabetes mellitus with diabetic chronic kidney disease: Secondary | ICD-10-CM

## 2015-08-06 DIAGNOSIS — I2510 Atherosclerotic heart disease of native coronary artery without angina pectoris: Secondary | ICD-10-CM

## 2015-08-06 DIAGNOSIS — I5042 Chronic combined systolic (congestive) and diastolic (congestive) heart failure: Secondary | ICD-10-CM | POA: Diagnosis present

## 2015-08-06 DIAGNOSIS — N1831 Chronic kidney disease, stage 3a: Secondary | ICD-10-CM

## 2015-08-06 DIAGNOSIS — E119 Type 2 diabetes mellitus without complications: Secondary | ICD-10-CM

## 2015-08-06 DIAGNOSIS — Z79899 Other long term (current) drug therapy: Secondary | ICD-10-CM | POA: Insufficient documentation

## 2015-08-06 DIAGNOSIS — Z792 Long term (current) use of antibiotics: Secondary | ICD-10-CM | POA: Insufficient documentation

## 2015-08-06 DIAGNOSIS — N182 Chronic kidney disease, stage 2 (mild): Secondary | ICD-10-CM

## 2015-08-06 DIAGNOSIS — Z9861 Coronary angioplasty status: Secondary | ICD-10-CM

## 2015-08-06 DIAGNOSIS — E785 Hyperlipidemia, unspecified: Secondary | ICD-10-CM | POA: Diagnosis present

## 2015-08-06 DIAGNOSIS — I10 Essential (primary) hypertension: Secondary | ICD-10-CM | POA: Diagnosis present

## 2015-08-06 DIAGNOSIS — Z7982 Long term (current) use of aspirin: Secondary | ICD-10-CM | POA: Insufficient documentation

## 2015-08-06 DIAGNOSIS — I5032 Chronic diastolic (congestive) heart failure: Secondary | ICD-10-CM | POA: Diagnosis not present

## 2015-08-06 DIAGNOSIS — I319 Disease of pericardium, unspecified: Secondary | ICD-10-CM | POA: Diagnosis not present

## 2015-08-06 DIAGNOSIS — M5489 Other dorsalgia: Secondary | ICD-10-CM | POA: Diagnosis not present

## 2015-08-06 DIAGNOSIS — R079 Chest pain, unspecified: Secondary | ICD-10-CM | POA: Diagnosis not present

## 2015-08-06 DIAGNOSIS — R0602 Shortness of breath: Secondary | ICD-10-CM | POA: Insufficient documentation

## 2015-08-06 DIAGNOSIS — M549 Dorsalgia, unspecified: Secondary | ICD-10-CM | POA: Diagnosis present

## 2015-08-06 DIAGNOSIS — R0789 Other chest pain: Secondary | ICD-10-CM | POA: Diagnosis not present

## 2015-08-06 HISTORY — DX: Acute myocardial infarction, unspecified: I21.9

## 2015-08-06 LAB — URINALYSIS, ROUTINE W REFLEX MICROSCOPIC
Bilirubin Urine: NEGATIVE
Glucose, UA: NEGATIVE mg/dL
Hgb urine dipstick: NEGATIVE
KETONES UR: NEGATIVE mg/dL
LEUKOCYTES UA: NEGATIVE
NITRITE: NEGATIVE
PH: 5 (ref 5.0–8.0)
PROTEIN: NEGATIVE mg/dL
Specific Gravity, Urine: 1.01 (ref 1.005–1.030)
Urobilinogen, UA: 0.2 mg/dL (ref 0.0–1.0)

## 2015-08-06 LAB — CBC
HCT: 33.2 % — ABNORMAL LOW (ref 36.0–46.0)
Hemoglobin: 10.6 g/dL — ABNORMAL LOW (ref 12.0–15.0)
MCH: 30.5 pg (ref 26.0–34.0)
MCHC: 31.9 g/dL (ref 30.0–36.0)
MCV: 95.7 fL (ref 78.0–100.0)
PLATELETS: 306 10*3/uL (ref 150–400)
RBC: 3.47 MIL/uL — AB (ref 3.87–5.11)
RDW: 15 % (ref 11.5–15.5)
WBC: 10.3 10*3/uL (ref 4.0–10.5)

## 2015-08-06 LAB — BRAIN NATRIURETIC PEPTIDE: B NATRIURETIC PEPTIDE 5: 36.4 pg/mL (ref 0.0–100.0)

## 2015-08-06 LAB — BASIC METABOLIC PANEL
Anion gap: 12 (ref 5–15)
BUN: 26 mg/dL — ABNORMAL HIGH (ref 6–20)
CHLORIDE: 107 mmol/L (ref 101–111)
CO2: 21 mmol/L — AB (ref 22–32)
Calcium: 9.6 mg/dL (ref 8.9–10.3)
Creatinine, Ser: 1.36 mg/dL — ABNORMAL HIGH (ref 0.44–1.00)
GFR calc non Af Amer: 37 mL/min — ABNORMAL LOW (ref >60)
GFR, EST AFRICAN AMERICAN: 43 mL/min — AB (ref >60)
GLUCOSE: 161 mg/dL — AB (ref 65–99)
Potassium: 4 mmol/L (ref 3.5–5.1)
Sodium: 140 mmol/L (ref 135–145)

## 2015-08-06 LAB — PROTIME-INR
INR: 0.96 (ref 0.00–1.49)
Prothrombin Time: 13 seconds (ref 11.6–15.2)

## 2015-08-06 LAB — D-DIMER, QUANTITATIVE: D-Dimer, Quant: 0.42 ug/mL-FEU (ref 0.00–0.48)

## 2015-08-06 LAB — I-STAT TROPONIN, ED
TROPONIN I, POC: 0 ng/mL (ref 0.00–0.08)
Troponin i, poc: 0 ng/mL (ref 0.00–0.08)

## 2015-08-06 MED ORDER — TICAGRELOR 60 MG PO TABS
60.0000 mg | ORAL_TABLET | Freq: Two times a day (BID) | ORAL | Status: DC
  Administered 2015-08-06 – 2015-08-07 (×2): 60 mg via ORAL
  Filled 2015-08-06 (×4): qty 1

## 2015-08-06 MED ORDER — GI COCKTAIL ~~LOC~~: 30.0000 mL | Freq: Four times a day (QID) | ORAL | Status: DC | PRN

## 2015-08-06 MED ORDER — ASPIRIN EC 81 MG PO TBEC
81.0000 mg | DELAYED_RELEASE_TABLET | Freq: Every day | ORAL | Status: DC
  Administered 2015-08-07: 81 mg via ORAL
  Filled 2015-08-06: qty 1

## 2015-08-06 MED ORDER — FLUTICASONE PROPIONATE 50 MCG/ACT NA SUSP
2.0000 | Freq: Every day | NASAL | Status: DC
  Filled 2015-08-06: qty 16

## 2015-08-06 MED ORDER — ACETAMINOPHEN 325 MG PO TABS: 650.0000 mg | ORAL_TABLET | ORAL | Status: DC | PRN

## 2015-08-06 MED ORDER — METOPROLOL SUCCINATE ER 100 MG PO TB24
100.0000 mg | ORAL_TABLET | Freq: Every day | ORAL | Status: DC
  Administered 2015-08-07: 100 mg via ORAL
  Filled 2015-08-06: qty 1

## 2015-08-06 MED ORDER — ALLOPURINOL 300 MG PO TABS
300.0000 mg | ORAL_TABLET | Freq: Every day | ORAL | Status: DC
  Administered 2015-08-07: 300 mg via ORAL
  Filled 2015-08-06: qty 1

## 2015-08-06 MED ORDER — ONDANSETRON HCL 4 MG/2ML IJ SOLN: 4.0000 mg | Freq: Four times a day (QID) | INTRAMUSCULAR | Status: DC | PRN

## 2015-08-06 MED ORDER — LORATADINE 10 MG PO TABS
10.0000 mg | ORAL_TABLET | Freq: Every day | ORAL | Status: DC
  Administered 2015-08-07: 10 mg via ORAL
  Filled 2015-08-06: qty 1

## 2015-08-06 MED ORDER — LISINOPRIL 2.5 MG PO TABS
2.5000 mg | ORAL_TABLET | Freq: Every day | ORAL | Status: DC
  Administered 2015-08-07: 2.5 mg via ORAL
  Filled 2015-08-06: qty 1

## 2015-08-06 MED ORDER — METFORMIN HCL 500 MG PO TABS
500.0000 mg | ORAL_TABLET | Freq: Two times a day (BID) | ORAL | Status: DC
  Administered 2015-08-07: 500 mg via ORAL
  Filled 2015-08-06: qty 1

## 2015-08-06 MED ORDER — ALPRAZOLAM 0.5 MG PO TABS
0.5000 mg | ORAL_TABLET | Freq: Two times a day (BID) | ORAL | Status: DC | PRN
  Administered 2015-08-06: 0.5 mg via ORAL
  Filled 2015-08-06: qty 1

## 2015-08-06 MED ORDER — PANTOPRAZOLE SODIUM 40 MG PO TBEC
40.0000 mg | DELAYED_RELEASE_TABLET | Freq: Every day | ORAL | Status: DC
  Administered 2015-08-07: 40 mg via ORAL
  Filled 2015-08-06: qty 1

## 2015-08-06 MED ORDER — ASPIRIN 81 MG PO CHEW
324.0000 mg | CHEWABLE_TABLET | Freq: Once | ORAL | Status: AC
  Administered 2015-08-06: 324 mg via ORAL
  Filled 2015-08-06: qty 4

## 2015-08-06 MED ORDER — ATORVASTATIN CALCIUM 40 MG PO TABS
40.0000 mg | ORAL_TABLET | Freq: Every day | ORAL | Status: DC
  Administered 2015-08-06: 40 mg via ORAL
  Filled 2015-08-06: qty 1

## 2015-08-06 MED ORDER — HYDROCHLOROTHIAZIDE 25 MG PO TABS
12.5000 mg | ORAL_TABLET | Freq: Every day | ORAL | Status: DC
  Administered 2015-08-07: 12.5 mg via ORAL
  Filled 2015-08-06: qty 1

## 2015-08-06 NOTE — ED Notes (Signed)
[facility] in with patient.

## 2015-08-06 NOTE — H&P (Signed)
History and Physical  Elizabeth Aguilar  FGH:[id number]  DOB: [birth year]  DOA: 08/06/2015  Referring physician: Roberto Scales, MD PCP: Sallee Lange, MD   Chief Complaint: "I've had this pain in my back and side for two days, and it hurts when I move and feels like when I had CHF and fluid around my heart last year."  HPI: Elizabeth Aguilar is a 74 y.o. 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  The patient was in her usual state of health until the last few days when she noted pain in her left chest/axilla and left back, intermittent, worse with movement, and moderate in intensity.  She thought this felt like her episode of "congestive heart failure" by which she means her hospitalization for CHF and pericarditis last November, and so she came to the ER.  She does endorse recent cold symptoms, but denies pain worsening with supine, pain with inspiration, increased leg swelling, persistent dyspnea on exertion.  She has no substernal pressure, and the pain is not like her previous MI.  She did help a friend move recently.    In the ED, the patient's EKG was unremarkable, her initial troponin was negative, and she was admitted to [facility] for observation and rule out ACS.   Review of Systems:  Patient seen 10:47 PM on 08/06/2015. Pt complains of left side and back pain, possible exertional and possibly shortness of breath. Pt denies any fever, chills, leg swelling, PND.  Denies pleuritic pain.  Otherwise, twelve systems were reviewed and were negative except as noted above in the history of present illness.  Past Medical History  Diagnosis Date  . Irritable bowel syndrome   . Hyperlipidemia   . Type 2 diabetes mellitus   . CAD (coronary artery disease)     a.  s/p STEMI s/p DES to LAD and RCA (12/2013)  .  Pericarditis   . Chronic diastolic CHF (congestive heart failure)   . Chest pain 10/10/2014  . Myocardial infarction   The above past medical history was reviewed.  Past Surgical History  Procedure Laterality Date  . Coronary angioplasty with stent placement    . Left heart catheterization with coronary angiogram N/A 12/16/2013    Procedure: LEFT HEART CATHETERIZATION WITH CORONARY ANGIOGRAM;  Surgeon: Troy Sine, MD;  Location: [facility] CATH LAB;  Service: Cardiovascular;  Laterality: N/A;  . Percutaneous coronary stent intervention (pci-s) N/A 12/17/2013    Procedure: PERCUTANEOUS CORONARY STENT INTERVENTION (PCI-S);  Surgeon: Troy Sine, MD;  Location: [facility] CATH LAB;  Service: Cardiovascular;  Laterality: N/A;  . Left heart catheterization with coronary angiogram  12/17/2013    Procedure: LEFT HEART CATHETERIZATION WITH CORONARY ANGIOGRAM;  Surgeon: Troy Sine, MD;  Location: [facility] CATH LAB;  Service: Cardiovascular;;  The above surgical history was reviewed.  Social History:  reports that she has never smoked. She does not have any smokeless tobacco history on file. She reports that she does not drink alcohol or use illicit drugs. Patient lives with her son in [place], [place].  She is a non-smoker and does not drink.  She is normally completely independent with all ADLs and IADLs.  Allergies  Allergen Reactions  . Actos [Pioglitazone] Swelling  . Ceftin [Cefuroxime Axetil] Nausea Only  . Celebrex [Celecoxib]     Sickness  .  Darvocet [Propoxyphene N-Acetaminophen] Nausea Only  . Doxycycline Nausea And Vomiting and Other (See Comments)    Bones ache  . Erythromycin Nausea And Vomiting  . Sulfa Antibiotics Nausea And Vomiting and Other (See Comments)    Bones ache   . Vioxx [Rofecoxib]     Sickness   . Zetia [Ezetimibe]     Side effects,cramps   . Zithromax [Azithromycin] Nausea Only  . Zocor [Simvastatin]     Side effects    Family History  Problem Relation Age of Onset  .  Diabetes Mother   . Diabetes Daughter   . Diabetes Son     Prior to Admission medications   Medication Sig Start Date End Date Taking? Authorizing Provider  allopurinol (ZYLOPRIM) 300 MG tablet Take 300 mg by mouth daily.    Historical Provider, MD  ALPRAZolam Duanne Moron) 0.5 MG tablet TAKE ONE TABLET TWICE DAILY AS NEEDED 05/04/15   Kathyrn Drown, MD  aspirin EC 81 MG EC tablet Take 1 tablet (81 mg total) by mouth daily. 12/20/13   Eileen Stanford, PA-C  atorvastatin (LIPITOR) 40 MG tablet Take 1 tablet (40 mg total) by mouth daily at 6 PM. 10/04/14   Kathyrn Drown, MD  diphenoxylate-atropine (LOMOTIL) 2.5-0.025 MG per tablet Take 1 tablet by mouth 3 (three) times daily as needed for diarrhea or loose stools. 09/19/14   Kathyrn Drown, MD  esomeprazole (NEXIUM) 40 MG capsule Take 1 capsule (40 mg total) by mouth every morning. 10/04/14   Kathyrn Drown, MD  fluticasone (FLONASE) 50 MCG/ACT nasal spray USE 2 SPRAYS IN EACH NOSTRIL DAILY 05/04/15   Kathyrn Drown, MD  hydrochlorothiazide (HYDRODIURIL) 25 MG tablet Take 0.5 tablets (12.5 mg total) by mouth daily. 10/04/14   Kathyrn Drown, MD  HYDROcodone-acetaminophen (NORCO/VICODIN) 5-325 MG per tablet Take 1 tablet by mouth every 6 (six) hours as needed. 05/22/15   Kathyrn Drown, MD  lisinopril (PRINIVIL,ZESTRIL) 2.5 MG tablet TAKE ONE (1) TABLET EACH DAY 04/07/15   Kathyrn Drown, MD  loratadine (CLARITIN) 10 MG tablet Take 1 tablet (10 mg total) by mouth daily. 10/04/14   Kathyrn Drown, MD  metFORMIN (GLUCOPHAGE) 500 MG tablet Take 1 tablet (500 mg total) by mouth 2 (two) times daily with a meal. 10/04/14   Kathyrn Drown, MD  metoprolol succinate (TOPROL XL) 100 MG 24 hr tablet Take 1 tablet (100 mg total) by mouth daily. Take with or immediately following a meal. 05/26/15   Troy Sine, MD  Multiple Vitamin (MULTIVITAMIN) tablet Take 1 tablet by mouth daily.    Historical Provider, MD  nitroGLYCERIN (NITROSTAT) 0.4 MG SL tablet Place 1 tablet  (0.4 mg total) under the tongue every 5 (five) minutes x 3 doses as needed for chest pain. 12/20/13   Eileen Stanford, PA-C  ondansetron (ZOFRAN) 8 MG tablet TAKE 1 TABLET EVERY 8 HOURS AS NEEDED FOR NAUSEA AND VOMITING 07/05/15   Kathyrn Drown, MD  ondansetron (ZOFRAN-ODT) 8 MG disintegrating tablet Take 1 tablet (8 mg total) by mouth every 8 (eight) hours as needed for nausea or vomiting. 01/09/15   Kathyrn Drown, MD  ticagrelor (BRILINTA) 60 MG TABS tablet Take 1 tablet (60 mg total) by mouth 2 (two) times daily. 05/26/15   Troy Sine, MD  vitamin C (ASCORBIC ACID) 500 MG tablet Take 500 mg by mouth daily.    Historical Provider, MD    Physical Exam: BP 127/54 mmHg  Pulse  80  Temp(Src) 97.5 F (36.4 C) (Oral)  Resp 22  Ht 5\' 3"  (1.6 m)  Wt 65.318 kg (144 lb)  BMI 25.51 kg/m2  SpO2 97% General: Elderly adult female, alert and in no acute distress.  Responds appropriately to questions.  Eye contact, dress and hygiene appropriate. HEENT: Head normal.  Corneas clear, conjunctivae and sclerae normal without injection or icterus, lids and lashes normal.  EOMI.  Nose normal.  OP moist without erythema, exudates, cobblestoning, or ulcers.  No airway deformities.  Neck supple. No thyromegaly.  Skin: Warm and dry.  No jaundice.  No suspicious rashes or lesions. Cardiac: RRR, nl S1-S2, no murmurs appreciated.  No rubs seated up or supine.  Capillary refill is less than 2 seconds.  JVP appreciated, ~7cm above clavicle.  No LE edema.  Radial and DP pulses 2+ and symmetric. Chest/back: There is no precordial tenderness.  There is no vertebral tenderness.  There is no paraspinal tenderness, but this on the left is where the pain is located. Respiratory: Normal respiratory rate and rhythm.  CTAB without rales or wheezes. Abdomen: BS present.  No TTP or rebound all quadrants.  No ascites, distension. Neuro: Sensorium intact.  Cranial nerves 3-12 intact.  Speech is fluent.  Naming is grossly intact, and  the patient's recall, recent and remote, as well as general fund of knowledge seem within normal limits.  Muscle bulk normal.  Moves all extremities equally and with normal coordination.    Psych: Appropriate affect.  Normal rate and rhythm of speech.  Thought content appropriate, and thought process linear.  No evidence of aural or visual hallucinations or delusions. Attention and concentration are normal.           Labs on Admission:  The metabolic panel is notable for normal sodium, potassium.  The serum creatinine is 1.36 mg/dL roughly similar to baseline of 1.0. The complete blood count is notable for mild normocytic anemia, normal thrombocyte count. The BNP is low. An initial troponin is normal. The urinalysis is unremarkable. The INR is normal.     Radiological Exams on Admission: Personally reviewed: Dg Chest 2 View 08/06/2015  No evidence of pneumothorax or effusion and no new opacity.    EKG: Independently reviewed. NSR.  No ST or PR changes.  Assessment/Plan Present on Admission:  . Chest pain . Back pain . Essential hypertension, benign . Hyperlipemia . Chronic diastolic CHF (congestive heart failure)   1. Chest and back discomfort: The patient describes this pain like her episode of pericarditis from last November at which time she really just had chest discomfort, like now without classic signs, symptoms or ECG findings of pericarditis, and it was just that an effusion was noted on CT angiogram, hence the diagnosis of pericarditis.  She got colchicine then, and her symptoms slowly resolved.  We did discuss that her helping her friend move might have also precipitated some back pain, as her pain there is located in the paraspinals only. -Echocardiogram to evaluate wall motion abnormalities and for effusion -serial troponin Total discussion-telemetry -Consult to cardiology   2. Coronary artery disease: -Continue aspirin, statin, before meals, beta blocker,  Brilinta   3. Hypertension: At goal. -Continue lisinopril and hydrochlorothiazide   4. Type 2 diabetes: Stable.  HgbA1c 5.9% in July. -Continue metformin   5. Chronic gout: No evidence of flare. -Continue allopurinol    DVT PPx: Low risk Diet: Cardiac Consultants: Cardiology Code Status: Full Family Communication: The potential for ACS versus pericarditis versus  MSK pain was discussed with the family, who were in agreement with observation overnight to rule out ACS and echocardiogram tomorrow as well as potential consultation with Cardiology.   Disposition Plan:  Admission for observation is judged to be reasonable and necessary at this time because the patient's presenting symptoms, physical exam findings, and initial radiographic and laboratory data in the context of chronic comorbidities is felt to place him/her at high risk for further clinical deterioration but it is anticipated at this time that the patient may be medically stable for discharge from the hospital within 2 midnights of admission.  A. The patient's presenting symptoms include chest pain.       Edwin Dada [facility] Pager [phone]

## 2015-08-06 NOTE — ED Notes (Signed)
Patient made aware that an urine sample is needed. Patient states she is unable to void at this time. Patient states she will let us know when she thinks she can provide the urine sample.

## 2015-08-06 NOTE — ED Notes (Signed)
Pt reports left sided CP onset 2 days ago. Pain became worse last night with laying down, worse with inspiration, ambulation and pain radiates to her left side of back. Denies SOB, dizziness.

## 2015-08-06 NOTE — ED Notes (Signed)
MD at bedside. 

## 2015-08-06 NOTE — ED Provider Notes (Signed)
CSN: [id number]     Arrival date & time 08/06/15  1726 History   First MD Initiated Contact with Patient 08/06/15 1733     Chief Complaint  Patient presents with  . Chest Pain   (Consider location/radiation/quality/duration/timing/severity/associated sxs/prior Treatment) Patient is a 74 y.o. female presenting with chest pain.  Chest Pain Pain location:  L chest and L lateral chest Pain quality: aching and dull   Pain quality: not sharp and not shooting   Pain radiates to:  Neck Pain radiates to the back: no   Pain severity:  Moderate Onset quality:  Gradual Duration:  3 days Timing:  Constant Progression:  Worsening Chronicity:  New Context: movement and stress   Context: not breathing   Relieved by:  Certain positions and rest Worsened by:  Certain positions, exertion and coughing Associated symptoms: cough and shortness of breath   Associated symptoms: no abdominal pain, no anxiety, no fever, no lower extremity edema and no palpitations     Past Medical History  Diagnosis Date  . Irritable bowel syndrome   . Hyperlipidemia   . Type 2 diabetes mellitus   . CAD (coronary artery disease)     a.  s/p STEMI s/p DES to LAD and RCA (12/2013)  . Pericarditis   . Chronic diastolic CHF (congestive heart failure)   . Chest pain 10/10/2014   Past Surgical History  Procedure Laterality Date  . Coronary angioplasty with stent placement    . Left heart catheterization with coronary angiogram N/A 12/16/2013    Procedure: LEFT HEART CATHETERIZATION WITH CORONARY ANGIOGRAM;  Surgeon: Troy Sine, MD;  Location: [facility] CATH LAB;  Service: Cardiovascular;  Laterality: N/A;  . Percutaneous coronary stent intervention (pci-s) N/A 12/17/2013    Procedure: PERCUTANEOUS CORONARY STENT INTERVENTION (PCI-S);  Surgeon: Troy Sine, MD;  Location: [facility] CATH LAB;  Service: Cardiovascular;  Laterality: N/A;  . Left heart catheterization with coronary angiogram  12/17/2013    Procedure: LEFT HEART  CATHETERIZATION WITH CORONARY ANGIOGRAM;  Surgeon: Troy Sine, MD;  Location: [facility] CATH LAB;  Service: Cardiovascular;;   Family History  Problem Relation Age of Onset  . Diabetes Mother   . Diabetes Daughter   . Diabetes Son    Social History  Substance Use Topics  . Smoking status: Never Smoker   . Smokeless tobacco: None  . Alcohol Use: No   OB History    No data available     Review of Systems  Constitutional: Negative for fever.  Respiratory: Positive for cough and shortness of breath.   Cardiovascular: Positive for chest pain. Negative for palpitations and leg swelling.  Gastrointestinal: Negative for abdominal pain.  All other systems reviewed and are negative.   Allergies  Actos; Ceftin; Celebrex; Darvocet; Doxycycline; Erythromycin; Sulfa antibiotics; Vioxx; Zetia; Zithromax; and Zocor  Home Medications   Prior to Admission medications   Medication Sig Start Date End Date Taking? Authorizing Corianna Avallone  allopurinol (ZYLOPRIM) 300 MG tablet Take 300 mg by mouth daily.    Historical Xenia Nile, MD  ALPRAZolam Duanne Moron) 0.5 MG tablet TAKE ONE TABLET TWICE DAILY AS NEEDED 05/04/15   Kathyrn Drown, MD  aspirin EC 81 MG EC tablet Take 1 tablet (81 mg total) by mouth daily. 12/20/13   Eileen Stanford, PA-C  atorvastatin (LIPITOR) 40 MG tablet Take 1 tablet (40 mg total) by mouth daily at 6 PM. 10/04/14   Kathyrn Drown, MD  ciprofloxacin (CIPRO) 500 MG tablet One bid for 5 days  06/26/15   Kathyrn Drown, MD  diphenoxylate-atropine (LOMOTIL) 2.5-0.025 MG per tablet Take 1 tablet by mouth 3 (three) times daily as needed for diarrhea or loose stools. 09/19/14   Kathyrn Drown, MD  esomeprazole (NEXIUM) 40 MG capsule Take 1 capsule (40 mg total) by mouth every morning. 10/04/14   Kathyrn Drown, MD  fluticasone (FLONASE) 50 MCG/ACT nasal spray USE 2 SPRAYS IN EACH NOSTRIL DAILY 05/04/15   Kathyrn Drown, MD  hydrochlorothiazide (HYDRODIURIL) 25 MG tablet Take 0.5 tablets (12.5 mg  total) by mouth daily. 10/04/14   Kathyrn Drown, MD  HYDROcodone-acetaminophen (NORCO/VICODIN) 5-325 MG per tablet Take 1 tablet by mouth every 6 (six) hours as needed. 05/22/15   Kathyrn Drown, MD  ketorolac (ACULAR) 0.5 % ophthalmic solution INSTILL 1 DROP IN BOTH EYES FOUR TIMES ADAY Patient not taking: Reported on 06/26/2015 12/23/14   Mikey Kirschner, MD  lisinopril (PRINIVIL,ZESTRIL) 2.5 MG tablet TAKE ONE (1) TABLET EACH DAY 04/07/15   Kathyrn Drown, MD  loratadine (CLARITIN) 10 MG tablet Take 1 tablet (10 mg total) by mouth daily. 10/04/14   Kathyrn Drown, MD  metFORMIN (GLUCOPHAGE) 500 MG tablet Take 1 tablet (500 mg total) by mouth 2 (two) times daily with a meal. 10/04/14   Kathyrn Drown, MD  metoprolol succinate (TOPROL XL) 100 MG 24 hr tablet Take 1 tablet (100 mg total) by mouth daily. Take with or immediately following a meal. 05/26/15   Troy Sine, MD  Multiple Vitamin (MULTIVITAMIN) tablet Take 1 tablet by mouth daily.    Historical Kaylise Blakeley, MD  nitroGLYCERIN (NITROSTAT) 0.4 MG SL tablet Place 1 tablet (0.4 mg total) under the tongue every 5 (five) minutes x 3 doses as needed for chest pain. 12/20/13   Eileen Stanford, PA-C  ondansetron (ZOFRAN) 8 MG tablet TAKE 1 TABLET EVERY 8 HOURS AS NEEDED FOR NAUSEA AND VOMITING 07/05/15   Kathyrn Drown, MD  ondansetron (ZOFRAN-ODT) 8 MG disintegrating tablet Take 1 tablet (8 mg total) by mouth every 8 (eight) hours as needed for nausea or vomiting. 01/09/15   Kathyrn Drown, MD  ticagrelor (BRILINTA) 60 MG TABS tablet Take 1 tablet (60 mg total) by mouth 2 (two) times daily. 05/26/15   Troy Sine, MD  UNABLE TO FIND Arthritis med prescribed by specialist sunliac?    Historical Zaryia Markel, MD  vitamin C (ASCORBIC ACID) 500 MG tablet Take 500 mg by mouth daily.    Historical Deshanti Adcox, MD   BP 133/60 mmHg  Pulse 89  Temp(Src) 97.5 F (36.4 C) (Oral)  Resp 18  Ht 5\' 3"  (1.6 m)  Wt 144 lb (65.318 kg)  BMI 25.51 kg/m2  SpO2  100% Physical Exam  Constitutional: She appears well-developed and well-nourished. No distress.  HENT:  Head: Normocephalic and atraumatic.  Mouth/Throat: No oropharyngeal exudate.  Neck: Normal range of motion.  Cardiovascular: Normal rate.   No murmur heard. Pulmonary/Chest: Effort normal and breath sounds normal. No respiratory distress. She has no wheezes. She exhibits no tenderness.  Mild bilateral crackles in bases  Abdominal: Soft. She exhibits no distension. There is no tenderness. There is no rebound.  Musculoskeletal: Normal range of motion. She exhibits no tenderness.  Trace bilateral lower extremity edema  Neurological: She is alert. No cranial nerve deficit. Coordination normal.  Skin: Skin is warm and dry. No rash noted. She is not diaphoretic. No erythema.  Psychiatric: Her behavior is normal.  Nursing note and vitals  reviewed.   ED Course  Procedures (including critical care time) Labs Review Labs Reviewed  BASIC METABOLIC PANEL - Abnormal; Notable for the following:    CO2 21 (*)    Glucose, Bld 161 (*)    BUN 26 (*)    Creatinine, Ser 1.36 (*)    GFR calc non Af Amer 37 (*)    GFR calc Af Amer 43 (*)    All other components within normal limits  CBC - Abnormal; Notable for the following:    RBC 3.47 (*)    Hemoglobin 10.6 (*)    HCT 33.2 (*)    All other components within normal limits  PROTIME-INR  D-DIMER, QUANTITATIVE (NOT AT [facility])  BRAIN NATRIURETIC PEPTIDE  URINALYSIS, ROUTINE W REFLEX MICROSCOPIC (NOT AT [facility])  Randolm Idol, ED  Randolm Idol, ED   Imaging Review Dg Chest 2 View  08/06/2015   CLINICAL DATA:  Left-sided chest pain for 2 days.  EXAM: CHEST  2 VIEW  COMPARISON:  Chest radiograph 10/10/2014  FINDINGS: Stable cardiac and mediastinal contours. No consolidative pulmonary opacities. Biapical pleural parenchymal thickening. Regional skeleton is unremarkable.  IMPRESSION: Pulmonary hyperinflation.  No acute cardiopulmonary process.    Electronically Signed   By: Lovey Newcomer M.D.   On: 08/06/2015 18:01   I have personally reviewed and evaluated these images and lab results as part of my medical decision-making.   Date: 08/06/2015  Rate: 87  Rhythm: normal sinus rhythm  QRS Axis: normal  Intervals: normal  ST/T Wave abnormalities: normal  Conduction Disutrbances: none  Narrative Interpretation:   Old EKG Reviewed: No significant changes noted  MDM   This may shot is a 74 year old female with past medical history of coronary artery disease status post drug-eluting stent in 2015 as well as diastolic congestive heart failure who presents to emergency department today with left sided chest and flank pain that started 3 days ago. Patient states that this chest pain is worse with exertion and also with lying on her right side. She denies she also endorses a cough for the past 3 days. She denies any fever. She denies any sputum production. She has no history of PEs or DVTs. She has low wells however unable to [facility] due to her age. Patient states that she's had short of breath has been chronic and that has been increasingly been worse over the past month and specifically over past few days. Patient denies any orthopnea.  Given ACS history and exertional chest pain, will admit for ACS rule out. Patient admitted to hospitalist.   Final diagnoses:  Chest pain, unspecified chest pain type  Chronic diastolic CHF (congestive heart failure)        Roberto Scales, MD 08/06/15 0071  Debby Freiberg, MD 08/12/15 [phone]

## 2015-08-06 NOTE — ED Notes (Addendum)
Patient transported to X-ray. Will attempt IV upon return

## 2015-08-06 NOTE — ED Notes (Signed)
Pt was able to ambulate to the restroom with stand by assistance. Pt placed back on monitor upon return to room. Pt remains monitored by blood pressure, pulse ox, and 5 lead. Pts family remains at bedside.

## 2015-08-07 ENCOUNTER — Observation Stay (HOSPITAL_BASED_OUTPATIENT_CLINIC_OR_DEPARTMENT_OTHER): Payer: Medicare Other

## 2015-08-07 DIAGNOSIS — Z9861 Coronary angioplasty status: Secondary | ICD-10-CM

## 2015-08-07 DIAGNOSIS — I5032 Chronic diastolic (congestive) heart failure: Secondary | ICD-10-CM

## 2015-08-07 DIAGNOSIS — R0789 Other chest pain: Secondary | ICD-10-CM

## 2015-08-07 DIAGNOSIS — R079 Chest pain, unspecified: Secondary | ICD-10-CM | POA: Diagnosis not present

## 2015-08-07 DIAGNOSIS — I2510 Atherosclerotic heart disease of native coronary artery without angina pectoris: Secondary | ICD-10-CM

## 2015-08-07 DIAGNOSIS — E119 Type 2 diabetes mellitus without complications: Secondary | ICD-10-CM | POA: Diagnosis not present

## 2015-08-07 DIAGNOSIS — I10 Essential (primary) hypertension: Secondary | ICD-10-CM

## 2015-08-07 DIAGNOSIS — E785 Hyperlipidemia, unspecified: Secondary | ICD-10-CM

## 2015-08-07 LAB — TROPONIN I

## 2015-08-07 NOTE — Progress Notes (Signed)
  Echocardiogram 2D Echocardiogram has been performed.  Elizabeth Aguilar M 08/07/2015, 8:52 AM

## 2015-08-07 NOTE — H&P (Addendum)
CARDIOLOGY CONSULT NOTE   Patient ID: Elizabeth Aguilar MRN: [id number], DOB/AGE: March 09, 1941   Admit date: 08/06/2015 Date of Consult: 08/07/2015  Primary Physician: Sallee Lange, MD Primary Cardiologist: Dr Claiborne Billings  Reason for consult:  Chest pain  Problem List  Past Medical History  Diagnosis Date  . Irritable bowel syndrome   . Hyperlipidemia   . Type 2 diabetes mellitus   . CAD (coronary artery disease)     a.  s/p STEMI s/p DES to LAD and RCA (12/2013)  . Pericarditis   . Chronic diastolic CHF (congestive heart failure)   . Chest pain 10/10/2014  . Myocardial infarction     Past Surgical History  Procedure Laterality Date  . Coronary angioplasty with stent placement    . Left heart catheterization with coronary angiogram N/A 12/16/2013    Procedure: LEFT HEART CATHETERIZATION WITH CORONARY ANGIOGRAM;  Surgeon: Troy Sine, MD;  Location: [facility] CATH LAB;  Service: Cardiovascular;  Laterality: N/A;  . Percutaneous coronary stent intervention (pci-s) N/A 12/17/2013    Procedure: PERCUTANEOUS CORONARY STENT INTERVENTION (PCI-S);  Surgeon: Troy Sine, MD;  Location: [facility] CATH LAB;  Service: Cardiovascular;  Laterality: N/A;  . Left heart catheterization with coronary angiogram  12/17/2013    Procedure: LEFT HEART CATHETERIZATION WITH CORONARY ANGIOGRAM;  Surgeon: Troy Sine, MD;  Location: [facility] CATH LAB;  Service: Cardiovascular;;    Allergies  Allergies  Allergen Reactions  . Actos [Pioglitazone] Swelling  . Ceftin [Cefuroxime Axetil] Nausea Only  . Celebrex [Celecoxib]     Sickness  . Darvocet [Propoxyphene N-Acetaminophen] Nausea Only  . Doxycycline Nausea And Vomiting and Other (See Comments)    Bones ache  . Erythromycin Nausea And Vomiting  . Sulfa Antibiotics Nausea And Vomiting and Other (See Comments)    Bones ache   . Vioxx [Rofecoxib]     Sickness   . Zetia [Ezetimibe]     Side effects,cramps   . Zithromax [Azithromycin] Nausea Only  . Zocor [Simvastatin]      Side effects    HPI  74 year old female with h/o DM, HTN, HLP, post anterolateral STEMI on 12/16/2013 with total LAD occlusion, S/P LAD DES placement, LVEF 35-40% with severe hypokinesis involving the mid distal anterolateral wall, apex, and extending to involve the apical, inferior segment, moderate MR. S/P high-grade focal RCA stenosis with DES. She has been on aspirin/Brilinta for dual antiplatelet therapy. She was discharged with a Life-vest, February 7 she did have a 5 beat run of VT. Her pre-discharge her ejection fraction was 25%. When I saw her in March 2015, her resting pulse was 104 beats per minute, and I further titrated Toprol-XL to 100 mg in the morning and 50 mg at night. She felt improved on this increase beta blocker regimen.  With improvement in her LV function, we discontinued her lifevest. She developed a significant GI illness ago and lost proximally 10-12 pounds. At that time, her blood pressure became low and she was taken off her spironolactone and apparently her metoprolol succinate was reduced to just 50 mg.  She was hospitalized from November 30 through 10/24/2014 with pericarditis. An echo Doppler study done in the hospital showed a moderate free-flowing pericardial effusion circumferential to the heart, without evidence for hemodynamic compromise. Ejection fraction was 45-50%. There was grade 1 diastolic dysfunction. Since she was on dual antiplatelet therapy, she was treated with colchicine and not nonsteroidal anti-inflammatory agents. She subsequently developed diarrhea with colchicine. Echo in 01/23/2015 showed LVEF  55-60%. She did not have any regional wall motion abnormalities. There was grade 1 diastolic dysfunction. There was no evidence for any residual pericardial effusion.  She presented yesterday with the pain in her left chest/axilla and left lower back, intermittent, worse with movement, and moderate in intensity. She thought this felt like  her episode of "congestive heart failure" by which she means her hospitalization for CHF and pericarditis last November, and so she came to the ER. She does endorse recent cold symptoms, but denies pain worsening with supine, pain with inspiration, increased leg swelling, persistent dyspnea on exertion. She has no substernal pressure, and the pain is not like her previous MI. She did help a friend move recently.   In the ED, the patient's EKG was unremarkable, her initial troponin was negative, and she was admitted to [facility] for observation and rule out ACS.  Inpatient Medications  . allopurinol  300 mg Oral Daily  . aspirin EC  81 mg Oral Daily  . atorvastatin  40 mg Oral q1800  . fluticasone  2 spray Each Nare Daily  . hydrochlorothiazide  12.5 mg Oral Daily  . lisinopril  2.5 mg Oral Daily  . loratadine  10 mg Oral Daily  . metFORMIN  500 mg Oral BID WC  . metoprolol succinate  100 mg Oral Daily  . pantoprazole  40 mg Oral Daily  . ticagrelor  60 mg Oral BID   Family History Family History  Problem Relation Age of Onset  . Diabetes Mother   . Diabetes Daughter   . Diabetes Son     Social History Social History   Social History  . Marital Status: Widowed    Spouse Name: N/A  . Number of Children: N/A  . Years of Education: N/A   Occupational History  . Not on file.   Social History Main Topics  . Smoking status: Never Smoker   . Smokeless tobacco: Not on file  . Alcohol Use: No  . Drug Use: No  . Sexual Activity: Not on file   Other Topics Concern  . Not on file   Social History Narrative     Review of Systems  General:  No chills, fever, night sweats or weight changes.  Cardiovascular:  No chest pain, dyspnea on exertion, edema, orthopnea, palpitations, paroxysmal nocturnal dyspnea. Dermatological: No rash, lesions/masses Respiratory: No cough, dyspnea Urologic: No hematuria, dysuria Abdominal:   No nausea, vomiting, diarrhea, bright red blood per rectum,  melena, or hematemesis Neurologic:  No visual changes, wkns, changes in mental status. All other systems reviewed and are otherwise negative except as noted above.  Physical Exam  Blood pressure 116/38, pulse 81, temperature 97.7 F (36.5 C), temperature source Oral, resp. rate 18, height 5\' 3"  (1.6 m), weight 140 lb 11.2 oz (63.821 kg), SpO2 99 %.  General: Pleasant, NAD Psych: Normal affect. Neuro: Alert and oriented X 3. Moves all extremities spontaneously. HEENT: Normal  Neck: Supple without bruits or JVD. Lungs:  Resp regular and unlabored, CTA. Heart: RRR no s3, s4, or murmurs. Abdomen: Soft, non-tender, non-distended, BS + x 4.  Extremities: No clubbing, cyanosis or edema. DP/PT/Radials 2+ and equal bilaterally.  Labs   Recent Labs  08/06/15 2351 08/07/15 0505  TROPONINI <0.03 <0.03   Lab Results  Component Value Date   WBC 10.3 08/06/2015   HGB 10.6* 08/06/2015   HCT 33.2* 08/06/2015   MCV 95.7 08/06/2015   PLT 306 08/06/2015    Recent Labs Lab 08/06/15 1835  NA 140  K 4.0  CL 107  CO2 21*  BUN 26*  CREATININE 1.36*  CALCIUM 9.6  GLUCOSE 161*   Lab Results  Component Value Date   CHOL 164 12/23/2014   HDL 52 12/23/2014   LDLCALC 86 12/23/2014   TRIG 130 12/23/2014   Lab Results  Component Value Date   DDIMER 0.42 08/06/2015   Invalid input(s): POCBNP  Radiology/Studies  Dg Chest 2 View  08/06/2015   CLINICAL DATA:  Left-sided chest pain for 2 days.  EXAM: CHEST  2 VIEW  COMPARISON:  Chest radiograph 10/10/2014  FINDINGS: Stable cardiac and mediastinal contours. No consolidative pulmonary opacities. Biapical pleural parenchymal thickening. Regional skeleton is unremarkable.  IMPRESSION: Pulmonary hyperinflation.  No acute cardiopulmonary process.   Electronically Signed   By: Lovey Newcomer M.D.   On: 08/06/2015 18:01   Echocardiogram - 01/23/2015 Left ventricle: The cavity size was normal. Wall thickness was normal. Systolic function was  normal. The estimated ejection fraction was in the range of 55% to 60%. Wall motion was normal; there were no regional wall motion abnormalities. Doppler parameters are consistent with abnormal left ventricular relaxation (grade 1 diastolic dysfunction).  Impressions:  - Normal LV function; grade 1 diastolic dysfunction; trace MR and TR. Compared to 10/11/14, pericardial effusion absent and EF better.  ECG: SR, PVCs, otherwise normal ECG, unchanged from 05/26/2015    ASSESSMENT AND PLAN  1. Atypical pain - mostly in left shoulder and lower back -  - unchanged and normal ECG - negative troponins - echo shows no new wall motion abnormalities  And no pericardial effusion - the pain is with motion only and most probably related to underlying arthritis as her meds were stopped few months ago by her rheumatologist for concern of GIB  2. CAD, stable, continue DAPT with aspirin, Brilinta, atorvastatin, metoprolol and lisinorpil  3. HTN - controlled.   4. Chronic diastolic CHF - she is euvolemic  She can be discharged from cardiac standpoint, follow up as outpatient as planned with Dr Claiborne Billings. Follow up with the rheumatologist recommended.    Signed, Dorothy Spark, MD, [facility] 08/07/2015, 8:54 AM

## 2015-08-07 NOTE — Discharge Summary (Signed)
Physician Discharge Summary  Elizabeth Aguilar AST:[id number] DOB: Apr 11, 1941 DOA: 08/06/2015  PCP: Sallee Lange, MD  Admit date: 08/06/2015 Discharge date: 08/07/2015  Time spent: 45 minutes  Recommendations for Outpatient Follow-up:  1. Dr.Kelly in 2 weeks  Discharge Diagnoses:  Principal Problem:   Chest pain Active Problems:   Hyperlipemia   Essential hypertension, benign   Diabetes type 2, controlled   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   Chronic diastolic CHF (congestive heart failure)   Back pain   Pain in the chest   Discharge Condition: stabe  Diet recommendation: heart healthy  Filed Weights   08/06/15 1731 08/07/15 0512  Weight: 65.318 kg (144 lb) 63.821 kg (140 lb 11.2 oz)    History of present illness:  Chief Complaint: "back/chest pain HPI: Elizabeth Aguilar is a 74 y.o. female with a past medical history significant for ASCVD s/p STEMI 12/2013 with PCI to LAD and RCA, subsequent isch CM now resolved, NIDDM, HTN, and pericarditis in 09/2014 who presented with several days intermittent left chest and back pain. The patient was in her usual state of health until the last few days when she noted pain in her left chest/axilla and left back, intermittent, worse with movement, and moderate in intensity  Hospital Course:   1.. Atypical Chest pain  - located in L lateral chest wall/shoulder and lower back -  - unchanged and normal ECG - negative troponins, 2D echo noted normal EF and wall motion, no pericardial effusion - seen by Cardiology and felt to be muscular, no further workup recommended at thsi time, she is advised to continue all her home meds and FU with Dr.Kelly in 2 weeks  2. CAD, stable, continue aspirin, Brilinta, atorvastatin, metoprolol and lisinopril  3. HTN - stable  4. Chronic diastolic CHF  -stable, compensated, EF normal  Procedures:  ECHO  Consultations:  Cardiology  Discharge Exam: Filed Vitals:   08/07/15 0512  BP: 116/38  Pulse: 81  Temp:  97.7 F (36.5 C)  Resp: 18    General: AAOx3 Cardiovascular: S1s2/RRR Respiratory: CTAB  Discharge Instructions   Discharge Instructions    Diet - low sodium heart healthy    Complete by:  As directed      Increase activity slowly    Complete by:  As directed           Current Discharge Medication List    CONTINUE these medications which have NOT CHANGED   Details  allopurinol (ZYLOPRIM) 300 MG tablet Take 300 mg by mouth daily.    ALPRAZolam (XANAX) 0.5 MG tablet TAKE ONE TABLET TWICE DAILY AS NEEDED Qty: 60 tablet, Refills: 4    aspirin EC 81 MG EC tablet Take 1 tablet (81 mg total) by mouth daily.    atorvastatin (LIPITOR) 40 MG tablet Take 1 tablet (40 mg total) by mouth daily at 6 PM. Qty: 30 tablet, Refills: 12    diphenoxylate-atropine (LOMOTIL) 2.5-0.025 MG per tablet Take 1 tablet by mouth 3 (three) times daily as needed for diarrhea or loose stools. Qty: 20 tablet, Refills: 0    esomeprazole (NEXIUM) 40 MG capsule Take 1 capsule (40 mg total) by mouth every morning. Qty: 30 capsule, Refills: 12    fluticasone (FLONASE) 50 MCG/ACT nasal spray USE 2 SPRAYS IN EACH NOSTRIL DAILY Qty: 16 g, Refills: 4    hydrochlorothiazide (HYDRODIURIL) 25 MG tablet Take 0.5 tablets (12.5 mg total) by mouth daily. Qty: 15 tablet, Refills: 12    HYDROcodone-acetaminophen (NORCO/VICODIN) 5-325  MG per tablet Take 1 tablet by mouth every 6 (six) hours as needed. Qty: 120 tablet, Refills: 0    lisinopril (PRINIVIL,ZESTRIL) 2.5 MG tablet TAKE ONE (1) TABLET EACH DAY Qty: 30 tablet, Refills: 5    loratadine (CLARITIN) 10 MG tablet Take 1 tablet (10 mg total) by mouth daily. Qty: 30 tablet, Refills: 12    metFORMIN (GLUCOPHAGE) 500 MG tablet Take 1 tablet (500 mg total) by mouth 2 (two) times daily with a meal. Qty: 60 tablet, Refills: 12    metoprolol succinate (TOPROL XL) 100 MG 24 hr tablet Take 1 tablet (100 mg total) by mouth daily. Take with or immediately following a  meal. Qty: 30 tablet, Refills: 11    Multiple Vitamin (MULTIVITAMIN) tablet Take 1 tablet by mouth daily.    nitroGLYCERIN (NITROSTAT) 0.4 MG SL tablet Place 1 tablet (0.4 mg total) under the tongue every 5 (five) minutes x 3 doses as needed for chest pain. Qty: 25 tablet, Refills: 12    ondansetron (ZOFRAN) 8 MG tablet TAKE 1 TABLET EVERY 8 HOURS AS NEEDED FOR NAUSEA AND VOMITING Qty: 21 tablet, Refills: 4    ticagrelor (BRILINTA) 60 MG TABS tablet Take 1 tablet (60 mg total) by mouth 2 (two) times daily. Qty: 60 tablet, Refills: 11    vitamin C (ASCORBIC ACID) 500 MG tablet Take 500 mg by mouth daily.       Allergies  Allergen Reactions  . Actos [Pioglitazone] Swelling  . Ceftin [Cefuroxime Axetil] Nausea Only  . Celebrex [Celecoxib]     Sickness  . Darvocet [Propoxyphene N-Acetaminophen] Nausea Only  . Doxycycline Nausea And Vomiting and Other (See Comments)    Bones ache  . Erythromycin Nausea And Vomiting  . Sulfa Antibiotics Nausea And Vomiting and Other (See Comments)    Bones ache   . Vioxx [Rofecoxib]     Sickness   . Zetia [Ezetimibe]     Side effects,cramps   . Zithromax [Azithromycin] Nausea Only  . Zocor [Simvastatin]     Side effects   Follow-up Information    Follow up with KELLY,THOMAS A, MD. Schedule an appointment as soon as possible for a visit in 2 weeks.   Specialty:  Cardiology   Contact information:   [address] [place] [address] [phone]        The results of significant diagnostics from this hospitalization (including imaging, microbiology, ancillary and laboratory) are listed below for reference.    Significant Diagnostic Studies: Dg Chest 2 View  08/06/2015   CLINICAL DATA:  Left-sided chest pain for 2 days.  EXAM: CHEST  2 VIEW  COMPARISON:  Chest radiograph 10/10/2014  FINDINGS: Stable cardiac and mediastinal contours. No consolidative pulmonary opacities. Biapical pleural parenchymal thickening. Regional  skeleton is unremarkable.  IMPRESSION: Pulmonary hyperinflation.  No acute cardiopulmonary process.   Electronically Signed   By: Lovey Newcomer M.D.   On: 08/06/2015 18:01    Microbiology: No results found for this or any previous visit (from the past 240 hour(s)).   Labs: Basic Metabolic Panel:  Recent Labs Lab 08/06/15 1835  NA 140  K 4.0  CL 107  CO2 21*  GLUCOSE 161*  BUN 26*  CREATININE 1.36*  CALCIUM 9.6   Liver Function Tests: No results for input(s): AST, ALT, ALKPHOS, BILITOT, PROT, ALBUMIN in the last 168 hours. No results for input(s): LIPASE, AMYLASE in the last 168 hours. No results for input(s): AMMONIA in the last 168 hours. CBC:  Recent  Labs Lab 08/06/15 1835  WBC 10.3  HGB 10.6*  HCT 33.2*  MCV 95.7  PLT 306   Cardiac Enzymes:  Recent Labs Lab 08/06/15 2351 08/07/15 0505  TROPONINI <0.03 <0.03   BNP: BNP (last 3 results)  Recent Labs  08/06/15 1835  BNP 36.4    ProBNP (last 3 results)  Recent Labs  10/10/14 1200 10/11/14 0900  PROBNP 1084.0* 1454.0*    CBG: No results for input(s): GLUCAP in the last 168 hours.     SignedDomenic Polite  [facility] 08/07/2015, 11:27 AM

## 2015-08-22 ENCOUNTER — Encounter: Payer: Self-pay | Admitting: Family Medicine

## 2015-08-22 ENCOUNTER — Ambulatory Visit (INDEPENDENT_AMBULATORY_CARE_PROVIDER_SITE_OTHER): Payer: Medicare Other | Admitting: Family Medicine

## 2015-08-22 VITALS — BP 118/78 | Ht 63.0 in | Wt 144.8 lb

## 2015-08-22 DIAGNOSIS — D649 Anemia, unspecified: Secondary | ICD-10-CM | POA: Diagnosis not present

## 2015-08-22 DIAGNOSIS — I10 Essential (primary) hypertension: Secondary | ICD-10-CM | POA: Diagnosis not present

## 2015-08-22 DIAGNOSIS — M549 Dorsalgia, unspecified: Secondary | ICD-10-CM

## 2015-08-22 DIAGNOSIS — Z23 Encounter for immunization: Secondary | ICD-10-CM

## 2015-08-22 DIAGNOSIS — M25559 Pain in unspecified hip: Secondary | ICD-10-CM | POA: Diagnosis not present

## 2015-08-22 DIAGNOSIS — I255 Ischemic cardiomyopathy: Secondary | ICD-10-CM | POA: Diagnosis not present

## 2015-08-22 DIAGNOSIS — K219 Gastro-esophageal reflux disease without esophagitis: Secondary | ICD-10-CM

## 2015-08-22 DIAGNOSIS — G8929 Other chronic pain: Secondary | ICD-10-CM | POA: Diagnosis not present

## 2015-08-22 DIAGNOSIS — M25569 Pain in unspecified knee: Secondary | ICD-10-CM | POA: Diagnosis not present

## 2015-08-22 MED ORDER — HYDROCODONE-ACETAMINOPHEN 5-325 MG PO TABS: 1.0000 | ORAL_TABLET | Freq: Four times a day (QID) | ORAL | Status: DC | PRN

## 2015-08-22 NOTE — Progress Notes (Signed)
   Subjective:    Patient ID: Elizabeth Aguilar, female    DOB: 18-Jul-1941, 74 y.o.   MRN: [id number]  HPI This patient was seen today for chronic pain  The medication list was reviewed and updated.   -Compliance with medication: hydrocodone  - Number patient states they take daily: 3-4 a day  -when was the last dose patient took? yesterday  The patient was advised the importance of maintaining medication and not using illegal substances with these.  Refills needed: yes  The patient was educated that we can provide 3 monthly scripts for their medication, it is their responsibility to follow the instructions.  Side effects or complications from medications:non  Patient is aware that pain medications are meant to minimize the severity of the pain to allow their pain levels to improve to allow for better function. They are aware of that pain medications cannot totally remove their pain  Due for UDT ( at least once per year) : UTD  Patient does seem rheumatologist diagnosed with rheumatoid arthritis osteoarthritis is on medications. Patient has chronic pain from these issues. Recently in the hospital she thinks is because of arthritis pain.  Patient with a history of anemia of chronic disease her TIBC and ferritin are normal.   wrists. Patient stated she was taking Sulindac from- rheumatology- Advised patient to stop this medication due to increased risk of bleeding per Dr Bary Leriche orders.   Review of Systems  patient denies high fever chills sweats denies shortness breath denies chest pressure relates back pain elbow pain also hip and knee pain. She denies numbness in the feet. Denies excessive thirst urination no rashes.    Objective:   Physical Exam Lungs are clear hearts regular she does have subjective discomfort in the thoracic spine lumbar spine both in the elbows and wrist      Assessment & Plan:  1. Anemia, unspecified anemia type  this patient's ferritin , TIBC is good  hemoglobin stable I believe she has anemia of chronic disease. Patient has had Hemoccult cards in the past which was negative.  2. Chronic back pain  chronic lumbar pain along with arthralgias pain medication she denies abusing it no more than 4 per day caution drowsiness caution constipation  3. Encounter for immunization  flu vaccine today.  4. Essential hypertension, benign  blood pressure good control. Continue current medications watch diet avoid excessive salt  5. Gastroesophageal reflux disease without esophagitis  reflux good control she uses generic Nexium apparently having some trouble getting this filled she will send [place] information regarding what she finds out  6. Chronic arthralgias of knees and hips, unspecified laterality  she sees rheumatologist they put her on Sulindac, I advised the patient not to take this because increased risk of bleeding.

## 2015-08-29 ENCOUNTER — Ambulatory Visit (INDEPENDENT_AMBULATORY_CARE_PROVIDER_SITE_OTHER): Payer: Medicare Other | Admitting: Physician Assistant

## 2015-08-29 ENCOUNTER — Encounter: Payer: Self-pay | Admitting: Physician Assistant

## 2015-08-29 VITALS — BP 124/66 | HR 70 | Ht 63.0 in | Wt 146.9 lb

## 2015-08-29 DIAGNOSIS — I255 Ischemic cardiomyopathy: Secondary | ICD-10-CM

## 2015-08-29 DIAGNOSIS — I213 ST elevation (STEMI) myocardial infarction of unspecified site: Secondary | ICD-10-CM

## 2015-08-29 DIAGNOSIS — E785 Hyperlipidemia, unspecified: Secondary | ICD-10-CM | POA: Diagnosis not present

## 2015-08-29 DIAGNOSIS — I219 Acute myocardial infarction, unspecified: Secondary | ICD-10-CM

## 2015-08-29 DIAGNOSIS — Z8679 Personal history of other diseases of the circulatory system: Secondary | ICD-10-CM | POA: Diagnosis not present

## 2015-08-29 NOTE — Progress Notes (Signed)
Cardiology Office Note   Date:  08/29/2015   ID:  Elizabeth Aguilar, DOB 1941/06/13, MRN [id number]  PCP:  Sallee Lange, MD  Cardiologist:  Dr Meredeth Ide, PA-C   Chief Complaint  Patient presents with  . Follow-up    post hosp--chest pain/pt states back from top to bottom was hurting/has not had since hospital  . Shortness of Breath    on exertion//no other Sx.    History of Present Illness: Elizabeth Aguilar is a 74 y.o. female with a history of 3.0 x 24 mm  DES to the LAD in the setting of a STEMI 12/16/2013.  She had staged PCI to the RCA the next day with 3.0 x 24 mm Promus DES. EF 35-40 percent at that time but improved to  55-60 percent by echo 08/07/2015.  She had pericarditis and D-CHF 10/2014 with a moderate pericardial effusion.  She also has a history of DM, HTN, HL.    She has severe arthritis and  Is followed by rheumatology. She was on a medication she cannot name, that was discontinued because of anemia. Since that medication was discontinued, she has really suffered with the arthritis and feels that she basically cannot move well at all.  Elizabeth Aguilar presents for post hospital follow-up.  She was hospitalized for atypical chest pain, mostly in her upper back. She was evaluated by Dr. Meda Coffee who felt the pain was noncardiac and no further workup is indicated.   Since discharge from the hospital the patient has continued to experience significant lower back and leg pain, but the upper back chest pain has resolved. She is really struggling with her arthritis right now, and her activity is significantly limited by this.  She did a course of steroids which helped , but she understands she cannot be on this chronically.   She denies dark or tarry stools. She denies significant constipation or diarrhea.  She thinks her family doctor took her off the arthritis medicine because of anemia.   Past Medical History  Diagnosis Date  . Irritable bowel syndrome   .  Hyperlipidemia   . Type 2 diabetes mellitus ([place])   . CAD (coronary artery disease)     a.  s/p STEMI s/p DES to LAD and RCA (12/2013)  . Pericarditis   . Chronic diastolic CHF (congestive heart failure) ([place])   . Chest pain 10/10/2014  . Myocardial infarction [facility])     Past Surgical History  Procedure Laterality Date  . Coronary angioplasty with stent placement    . Left heart catheterization with coronary angiogram N/A 12/16/2013    Procedure: LEFT HEART CATHETERIZATION WITH CORONARY ANGIOGRAM;  Surgeon: Troy Sine, MD;  Location[facility] C[facility] LAB;  Service: Cardiovascular;  Laterality: N/A;  . Percutaneous coronary stent intervention (pci-s) N/A 12/17/2013    Procedure: PERCUTANEOUS CORONARY STENT INTERVENTION (PCI-S);  Surgeon: Troy Sine, MD;  Location: [facility] CATH LAB;  Service: Cardiovascular;  Laterality: N/A;  . Left heart catheterization with coronary angiogram  12/17/2013    Procedure: LEFT HEART CATHETERIZATION WITH CORONARY ANGIOGRAM;  Surgeon: Troy Sine, MD;  Location: [facility] CATH LAB;  Service: Cardiovascular;;    Current Outpatient Prescriptions  Medication Sig Dispense Refill  . allopurinol (ZYLOPRIM) 300 MG tablet Take 300 mg by mouth daily.    [name] ALPRAZolam (XANAX) 0.5 MG tablet TAKE ONE TABLET TWICE DAILY AS NEEDED (Patient taking differently: TAKE ONE TABLET TWICE DAILY AS NEEDED for anxiety) 60 tablet  4  . aspirin EC 81 MG EC tablet Take 1 tablet (81 mg total) by mouth daily.    [name] atorvastatin (LIPITOR) 40 MG tablet Take 1 tablet (40 mg total) by mouth daily at 6 PM. 30 tablet 12  . diphenoxylate-atropine (LOMOTIL) 2.5-0.025 MG per tablet Take 1 tablet by mouth 3 (three) times daily as needed for diarrhea or loose stools. 20 tablet 0  . esomeprazole (NEXIUM) 40 MG capsule Take 1 capsule (40 mg total) by mouth every morning. 30 capsule 12  . fluticasone (FLONASE) 50 MCG/ACT nasal spray USE 2 SPRAYS IN EACH NOSTRIL DAILY 16 g 4  . hydrochlorothiazide (HYDRODIURIL) 25 MG  tablet Take 0.5 tablets (12.5 mg total) by mouth daily. 15 tablet 12  . HYDROcodone-acetaminophen (NORCO/VICODIN) 5-325 MG tablet Take 1 tablet by mouth every 6 (six) hours as needed. 120 tablet 0  . lisinopril (PRINIVIL,ZESTRIL) 2.5 MG tablet TAKE ONE (1) TABLET EACH DAY 30 tablet 5  . loratadine (CLARITIN) 10 MG tablet Take 1 tablet (10 mg total) by mouth daily. 30 tablet 12  . metFORMIN (GLUCOPHAGE) 500 MG tablet Take 1 tablet (500 mg total) by mouth 2 (two) times daily with a meal. 60 tablet 12  . metoprolol succinate (TOPROL XL) 100 MG 24 hr tablet Take 1 tablet (100 mg total) by mouth daily. Take with or immediately following a meal. 30 tablet 11  . Multiple Vitamin (MULTIVITAMIN) tablet Take 1 tablet by mouth daily.    . nitroGLYCERIN (NITROSTAT) 0.4 MG SL tablet Place 1 tablet (0.4 mg total) under the tongue every 5 (five) minutes x 3 doses as needed for chest pain. 25 tablet 12  . ondansetron (ZOFRAN) 8 MG tablet TAKE 1 TABLET EVERY 8 HOURS AS NEEDED FOR NAUSEA AND VOMITING 21 tablet 4  . ticagrelor (BRILINTA) 60 MG TABS tablet Take 1 tablet (60 mg total) by mouth 2 (two) times daily. 60 tablet 11  . vitamin C (ASCORBIC ACID) 500 MG tablet Take 500 mg by mouth daily.     No current facility-administered medications for this visit.    Allergies:   Actos; Ceftin; Celebrex; Darvocet; Doxycycline; Erythromycin; Sulfa antibiotics; Vioxx; Zetia; Zithromax; and Zocor    Social History:  The patient  reports that she has never smoked. She does not have any smokeless tobacco history on file. She reports that she does not drink alcohol or use illicit drugs.   Family History:  The patient's family history includes Diabetes in her daughter, mother, and son.    ROS:  Please see the history of present illness. All other systems are reviewed and negative.    PHYSICAL EXAM: VS:  BP 124/66 mmHg  Pulse 70  Ht 5\' 3"  (1.6 m)  Wt 146 lb 14.4 oz (66.633 kg)  BMI 26.03 kg/m2 , BMI Body mass index is  26.03 kg/(m^2). GEN: Well nourished, well developed, female in no acute distress HEENT: normal for age  Neck: no JVD, no carotid bruit, no masses Cardiac: RRR; no murmur, no rubs, or gallops Respiratory:  clear to auscultation bilaterally, normal work of breathing GI: soft, nontender, nondistended, + BS Elizabeth: no deformity or atrophy; no edema; distal pulses are 2+ in all 4 extremities  Skin: warm and dry, no rash Neuro:  Strength and sensation are intact Psych: euthymic mood, full affect   EKG:  EKG is not ordered today.  Recent Labs: 10/10/2014: ALT 11 10/11/2014: Pro B Natriuretic peptide (BNP) 1454.0* 02/21/2015: TSH 1.790 08/06/2015: B Natriuretic Peptide 36.4; BUN 26*;  Creatinine, Ser 1.36*; Hemoglobin 10.6*; Platelets 306; Potassium 4.0; Sodium 140    Lipid Panel    Component Value Date/Time   CHOL 164 12/23/2014 0918   TRIG 130 12/23/2014 0918   HDL 52 12/23/2014 0918   CHOLHDL 3.2 12/23/2014 0918   VLDL 26 12/23/2014 0918   LDLCALC 86 12/23/2014 0918     Wt Readings from Last 3 Encounters:  08/29/15 146 lb 14.4 oz (66.633 kg)  08/22/15 144 lb 12.8 oz (65.681 kg)  08/07/15 140 lb 11.2 oz (63.821 kg)     Other studies Reviewed: Additional studies/ records that were reviewed today include:  Hospital records,  Office notes and labs.  ASSESSMENT AND PLAN:  1.   posthospital follow-up: Elizabeth Aguilar  Is doing well from a cardiac standpoint. She feels that she has some dyspnea on exertion but she also struggles to exert herself at all because of the pain from her arthritis.    I explained that I agreed with the doctor about stopping her arthritis medicine. I said that we would not want to discontinue the Brilinta unless Dr. Claiborne Billings agreed that would be okay.  Her blood pressure is well controlled, and so she should continue the aspirin, beta blocker, Brilinta, lisinopril, and statin at current doses. She is also on nitroglycerin. Volume management is with HCTZ.   Her blood  pressure is well controlled, so no dose changes.  She is to follow-up with Dr. Claiborne Billings early next year and get a lipid profile and liver function testing at that time.   2. Anemia: I discussed that if they found a problem with bleeding, they might want her to stop her Brilinta or aspirin.  Although she  is mildly anemic, her CBC has been stable.  From a cardiology standpoint, we would prefer that Dr. Claiborne Billings be involved on any decision tohold the Brilinta or aspirin  3.  Poor dentition: prior to her MI , she was going to have some of her teeth pulled.  She has not yet had this done because of the Brilinta.  Current medicines are reviewed at length with the patient today.  The patient does not have concerns regarding medicines.  The following changes have been made:  no change  Labs/ tests ordered today include:  Orders Placed This Encounter  Procedures  . Lipid panel  . Hepatic function panel     Disposition:   FU with  Dr. Claiborne Billings early in 2017.  Augusto Garbe  08/29/2015 4:21 PM    [facility] [place], [place], [place]  [address] Phone: [phone]; Fax: [phone]

## 2015-08-29 NOTE — Patient Instructions (Addendum)
Your physician recommends that you schedule a follow-up appointment in: January 2017 with Dr. Claiborne Billings  Your physician recommends that you return for fasting lab work in: one week before follow up appointment with Dr. Claiborne Billings. *Do not eat anything for 12 hours prior to having lab work done.*

## 2015-09-08 ENCOUNTER — Telehealth: Payer: Self-pay | Admitting: Family Medicine

## 2015-09-08 NOTE — Telephone Encounter (Signed)
LMOVM for pt - Dr. Nicki Reaper would like to know if patient wants to switch to Omeprazole or name brand Nexium?  This is what her insurance will cover

## 2015-09-11 ENCOUNTER — Other Ambulatory Visit: Payer: Self-pay | Admitting: *Deleted

## 2015-09-11 MED ORDER — OMEPRAZOLE 40 MG PO CPDR: 40.0000 mg | DELAYED_RELEASE_CAPSULE | Freq: Every day | ORAL | Status: DC

## 2015-09-11 NOTE — Telephone Encounter (Signed)
Omeprazole sent to pharm. nexium canceled.

## 2015-09-11 NOTE — Telephone Encounter (Signed)
Omeprazole 40 mg 1 daily, #30, 5 refills-cancel the other medicine

## 2015-09-11 NOTE — Telephone Encounter (Signed)
Patient called back to say that she would like for her GERD medicine to be switched to OMEPRAZOLE  Please send to [facility] in [place]   (Explained to let us know if this doesn't work controlling her symptoms and we could try to get the generic Nexium covered after failing on omeprazole)

## 2015-09-22 ENCOUNTER — Ambulatory Visit (INDEPENDENT_AMBULATORY_CARE_PROVIDER_SITE_OTHER): Payer: Medicare Other | Admitting: Nurse Practitioner

## 2015-09-22 VITALS — BP 128/74 | Temp 98.0°F | Ht 63.0 in

## 2015-09-22 DIAGNOSIS — K047 Periapical abscess without sinus: Secondary | ICD-10-CM

## 2015-09-22 DIAGNOSIS — J300 Vasomotor rhinitis: Secondary | ICD-10-CM

## 2015-09-22 DIAGNOSIS — H8392 Unspecified disease of left inner ear: Secondary | ICD-10-CM | POA: Diagnosis not present

## 2015-09-22 DIAGNOSIS — I255 Ischemic cardiomyopathy: Secondary | ICD-10-CM

## 2015-09-22 MED ORDER — PENICILLIN V POTASSIUM 500 MG PO TABS: 500.0000 mg | ORAL_TABLET | Freq: Four times a day (QID) | ORAL | Status: DC

## 2015-09-22 MED ORDER — MECLIZINE HCL 25 MG PO TABS: 25.0000 mg | ORAL_TABLET | Freq: Three times a day (TID) | ORAL | Status: DC | PRN

## 2015-09-25 ENCOUNTER — Encounter: Payer: Self-pay | Admitting: Nurse Practitioner

## 2015-09-25 NOTE — Progress Notes (Signed)
Subjective:   Presents for complaints of left ear fullness for the past 3 days. Hard to hear in that ear. Has now developed dizziness at times. No numbness or weakness of the face arms or legs. No difficulty speaking or swallowing. Frontal area headache. Runny nose. Cough worse at night. Yellowish mucus. No sore throat. No fever. Also has an abscessed tooth on the left lower side. Her dentist cannot remove at this point due to her blood thinners that she cannot stop.  Objective:   BP 128/74 mmHg  Temp(Src) 98 F (36.7 C) (Oral)  Ht 5\' 3"  (1.6 m)  Wt  NAD. Alert, oriented. TMs retracted bilateral, much more on the left side. No erythema. Pharynx injected with PND noted. Neck supple with mild soft anterior adenopathy. Lungs clear. Heart regular rate rhythm. EOMs intact without nystagmus. Point-to-point localization normal limit. Hand strength 5+ bilateral. Reflexes normal limit. Romberg negative.  Assessment: Vasomotor rhinitis  Inner ear dysfunction, left  Abscessed tooth  Plan:  Meds ordered this encounter  Medications  . meclizine (ANTIVERT) 25 MG tablet    Sig: Take 1 tablet (25 mg total) by mouth 3 (three) times daily as needed for dizziness.    Dispense:  30 tablet    Refill:  0    Order Specific Question:  Supervising Provider    Answer:  Mikey Kirschner [[id number]]  . penicillin v potassium (VEETID) 500 MG tablet    Sig: Take 1 tablet (500 mg total) by mouth 4 (four) times daily.    Dispense:  28 tablet    Refill:  0    Order Specific Question:  Supervising Provider    Answer:  Mikey Kirschner [[id number]]   Restart Flonase as directed. Antivert as directed for dizziness. Warning signs reviewed. Call back if worsens or persists.

## 2015-09-30 ENCOUNTER — Other Ambulatory Visit: Payer: Self-pay | Admitting: Family Medicine

## 2015-10-02 NOTE — Telephone Encounter (Signed)
May have this in 4 refills 

## 2015-10-09 DIAGNOSIS — I213 ST elevation (STEMI) myocardial infarction of unspecified site: Secondary | ICD-10-CM | POA: Diagnosis not present

## 2015-10-09 DIAGNOSIS — Z8679 Personal history of other diseases of the circulatory system: Secondary | ICD-10-CM | POA: Diagnosis not present

## 2015-10-09 DIAGNOSIS — Z79899 Other long term (current) drug therapy: Secondary | ICD-10-CM | POA: Diagnosis not present

## 2015-10-09 DIAGNOSIS — M2550 Pain in unspecified joint: Secondary | ICD-10-CM | POA: Diagnosis not present

## 2015-10-10 LAB — LIPID PANEL
CHOL/HDL RATIO: 3.7 ratio (ref <=5.0)
Cholesterol: 147 mg/dL (ref 125–200)
HDL: 40 mg/dL — AB (ref >=46)
LDL Cholesterol: 67 mg/dL (ref <130)
Triglycerides: 199 mg/dL — ABNORMAL HIGH (ref <150)
VLDL: 40 mg/dL — ABNORMAL HIGH (ref <30)

## 2015-10-10 LAB — HEPATIC FUNCTION PANEL
ALT: 9 U/L (ref 6–29)
AST: 11 U/L (ref 10–35)
Albumin: 3.9 g/dL (ref 3.6–5.1)
Alkaline Phosphatase: 133 U/L — ABNORMAL HIGH (ref 33–130)
BILIRUBIN TOTAL: 0.3 mg/dL (ref 0.2–1.2)
Bilirubin, Direct: 0.1 mg/dL (ref <=0.2)
Indirect Bilirubin: 0.2 mg/dL (ref 0.2–1.2)
Total Protein: 6.3 g/dL (ref 6.1–8.1)

## 2015-10-30 ENCOUNTER — Other Ambulatory Visit: Payer: Self-pay | Admitting: Family Medicine

## 2015-10-30 DIAGNOSIS — M170 Bilateral primary osteoarthritis of knee: Secondary | ICD-10-CM | POA: Diagnosis not present

## 2015-10-30 DIAGNOSIS — M1A00X0 Idiopathic chronic gout, unspecified site, without tophus (tophi): Secondary | ICD-10-CM | POA: Diagnosis not present

## 2015-10-30 DIAGNOSIS — M79672 Pain in left foot: Secondary | ICD-10-CM | POA: Diagnosis not present

## 2015-10-30 DIAGNOSIS — M19041 Primary osteoarthritis, right hand: Secondary | ICD-10-CM | POA: Diagnosis not present

## 2015-10-30 NOTE — Telephone Encounter (Signed)
nexium not on med list

## 2015-11-22 ENCOUNTER — Encounter: Payer: Self-pay | Admitting: Family Medicine

## 2015-11-22 ENCOUNTER — Ambulatory Visit (INDEPENDENT_AMBULATORY_CARE_PROVIDER_SITE_OTHER): Payer: Medicare Other | Admitting: Family Medicine

## 2015-11-22 VITALS — BP 110/68 | Temp 98.0°F | Ht 63.0 in | Wt 145.0 lb

## 2015-11-22 DIAGNOSIS — M1280 Other specific arthropathies, not elsewhere classified, unspecified site: Secondary | ICD-10-CM | POA: Insufficient documentation

## 2015-11-22 DIAGNOSIS — E785 Hyperlipidemia, unspecified: Secondary | ICD-10-CM | POA: Diagnosis not present

## 2015-11-22 DIAGNOSIS — R0600 Dyspnea, unspecified: Secondary | ICD-10-CM | POA: Diagnosis not present

## 2015-11-22 DIAGNOSIS — G8929 Other chronic pain: Secondary | ICD-10-CM

## 2015-11-22 DIAGNOSIS — M549 Dorsalgia, unspecified: Secondary | ICD-10-CM

## 2015-11-22 DIAGNOSIS — E119 Type 2 diabetes mellitus without complications: Secondary | ICD-10-CM | POA: Diagnosis not present

## 2015-11-22 DIAGNOSIS — M1990 Unspecified osteoarthritis, unspecified site: Secondary | ICD-10-CM

## 2015-11-22 DIAGNOSIS — R079 Chest pain, unspecified: Secondary | ICD-10-CM

## 2015-11-22 DIAGNOSIS — I10 Essential (primary) hypertension: Secondary | ICD-10-CM

## 2015-11-22 DIAGNOSIS — R0789 Other chest pain: Secondary | ICD-10-CM

## 2015-11-22 DIAGNOSIS — K219 Gastro-esophageal reflux disease without esophagitis: Secondary | ICD-10-CM | POA: Diagnosis not present

## 2015-11-22 LAB — POCT GLYCOSYLATED HEMOGLOBIN (HGB A1C): Hemoglobin A1C: 6.2

## 2015-11-22 MED ORDER — HYDROCODONE-ACETAMINOPHEN 5-325 MG PO TABS: 1.0000 | ORAL_TABLET | Freq: Four times a day (QID) | ORAL | Status: DC | PRN

## 2015-11-22 MED ORDER — AMOXICILLIN 500 MG PO TABS: 500.0000 mg | ORAL_TABLET | Freq: Three times a day (TID) | ORAL | Status: DC

## 2015-11-22 NOTE — Progress Notes (Signed)
Subjective:    Patient ID: Elizabeth Aguilar, female    DOB: June 10, 1941, 75 y.o.   MRN: [id number]  HPI This patient was seen today for chronic pain  The medication list was reviewed and updated.   -Compliance with medication: takes as prescribed  - Number patient states they take daily: one tid  -when was the last dose patient took? This am   The patient was advised the importance of maintaining medication and not using illegal substances with these.  Refills needed: yes  The patient was educated that we can provide 3 monthly scripts for their medication, it is their responsibility to follow the instructions.  Side effects or complications from medications: none  Patient is aware that pain medications are meant to minimize the severity of the pain to allow their pain levels to improve to allow for better function. They are aware of that pain medications cannot totally remove their pain.  Due for UDT ( at least once per year) : last one July 2016  Having chest pain on left side, pain in left arm, and sob. Has off and on. Started this am. Having some cough, sinus drainage.   Needs rx for diabetic shoes.    Patient denies any chest tightness pressure pain with walking around. She does relate intermittent numbness in her feet. She also relates chest wall pain on the left side and pain in the left arm when she moves her arm. She states she had been doing some significant lifting with that side. She denies PND.  She also has arthritic condition the arthritis doctor put her on anti-inflammatory but with her being on blood thinner for her heart condition this could be potentially detrimental she was told to stop it.  She also relates that her reflux is no longer doing any well with the current medicine. We will try different medicine but unfortunately is often hard to figure out what the dad gone insurance company will cover so therefore we will give it a shot.    25 minutes was spent  with the patient. Greater than half the time was spent in discussion and answering questions and counseling regarding the issues that the patient came in for today.   Review of Systems  Constitutional: Negative for activity change, appetite change and fatigue.  HENT: Negative for congestion.   Respiratory: Negative for cough.   Cardiovascular: Negative for chest pain.  Gastrointestinal: Negative for abdominal pain.  Endocrine: Negative for polydipsia and polyphagia.  Neurological: Negative for weakness.  Psychiatric/Behavioral: Negative for confusion.       Objective:   Physical Exam  Constitutional: She appears well-nourished. No distress.  Cardiovascular: Normal rate, regular rhythm and normal heart sounds.   No murmur heard. Pulmonary/Chest: Effort normal and breath sounds normal. No respiratory distress.  Musculoskeletal: She exhibits no edema.  Lymphadenopathy:    She has no cervical adenopathy.  Neurological: She is alert. She exhibits normal muscle tone.  Psychiatric: Her behavior is normal.  Vitals reviewed.         Assessment & Plan:  The patient was seen today as part of a comprehensive visit regarding pain control. Patient's compliance with the medication as well as discussion regarding effectiveness was completed. Prescriptions were written. Patient was advised to follow-up in 3 months. The patient was assessed for any signs of severe side effects. The patient was advised to take the medicine as directed and to report to [place] if any side effect issues.  Chest wall pain  she has tenderness in the left upper chest she has tenderness with movement of the arm EKG looks good I do not feel this is coronary artery disease causing this  Patient with significant head congestion and cough drainage. Moderate sinus infection. No wheezing noted on physical exam.

## 2015-11-29 ENCOUNTER — Other Ambulatory Visit: Payer: Self-pay | Admitting: Family Medicine

## 2015-12-04 ENCOUNTER — Ambulatory Visit (INDEPENDENT_AMBULATORY_CARE_PROVIDER_SITE_OTHER): Payer: Medicare Other | Admitting: Cardiovascular Disease

## 2015-12-04 ENCOUNTER — Encounter: Payer: Self-pay | Admitting: Cardiovascular Disease

## 2015-12-04 VITALS — BP 110/60 | HR 86 | Ht 63.0 in | Wt 145.2 lb

## 2015-12-04 DIAGNOSIS — K219 Gastro-esophageal reflux disease without esophagitis: Secondary | ICD-10-CM

## 2015-12-04 DIAGNOSIS — E785 Hyperlipidemia, unspecified: Secondary | ICD-10-CM | POA: Diagnosis not present

## 2015-12-04 DIAGNOSIS — I10 Essential (primary) hypertension: Secondary | ICD-10-CM | POA: Diagnosis not present

## 2015-12-04 DIAGNOSIS — E1121 Type 2 diabetes mellitus with diabetic nephropathy: Secondary | ICD-10-CM

## 2015-12-04 DIAGNOSIS — I2510 Atherosclerotic heart disease of native coronary artery without angina pectoris: Secondary | ICD-10-CM | POA: Diagnosis not present

## 2015-12-04 MED ORDER — METOPROLOL SUCCINATE ER 100 MG PO TB24: ORAL_TABLET | ORAL | Status: DC

## 2015-12-04 NOTE — Patient Instructions (Signed)
Your physician has recommended you make the following change in your medication:   The metoprolol has been changed to 1/2 tablet twice a day.  STOP the hydrochlorothiazide  Your physician wants you to follow-up in: 6 months or sooner. You will receive a reminder letter in the mail two months in advance. If you don't receive a letter, please call our office to schedule the follow-up appointment..  If you need a refill on your cardiac medications before your next appointment, please call your pharmacy.

## 2015-12-05 ENCOUNTER — Encounter: Payer: Self-pay | Admitting: Cardiovascular Disease

## 2015-12-05 DIAGNOSIS — I2510 Atherosclerotic heart disease of native coronary artery without angina pectoris: Secondary | ICD-10-CM | POA: Insufficient documentation

## 2015-12-05 NOTE — Progress Notes (Signed)
Patient ID: Elizabeth Aguilar, female   DOB: 22-Nov-1940, 75 y.o.   MRN: [id number]    Primary M.D.: Dr. Sallee Lange  HPI: Elizabeth Aguilar is a 75 y.o. female who presents for 6 month cardiology follow-up evaluation.    Elizabeth Aguilar suffered an ST segment elevation myocardial infarction and was transported from [facility] on 12/16/2013 in the setting of anterolateral MI.  She was taken acutely to the catheterization laboratory, which showed acute LV dysfunction with an ejection fraction of 35-40% with severe hypokinesis involving the mid distal anterolateral wall, apex, and extending to involve the apical, inferior segment.  She had 2+ angiographic mitral regurgitation.  She underwent successful stenting of her total occluded LAD and a 3.0x24 mm Promus premier DES stent was inserted. Due to high grade concomitant CAD, she was brought back to the cardiac catheterization laboratory the following day where LAD stent was widely patent.  Her high-grade focal RCA stenosis of greater than 90% was successfully treated with with ultimate stenting with a 3.0x24 mm Promus premier DES stent, postdilated to 3.26 mm.  She has been on aspirin/Brilinta for dual antiplatelet therapy.  She was discharged with a Life-vest, February 7 she did have a 5 beat run of VT. Her pre-discharge her ejection fraction was 25%.  When I saw her in March 2015, her resting pulse was 104 beats per minute, and I further titrated Toprol-XL to 100 mg in the morning and 50 mg at night.  She  felt improved on this increase beta blocker regimen.   An echo Doppler study on 03/03/2014 demonstrated improved LV function  from 25% to 40% and there was evidence for residual hypokinesis of the distal left ventricle with akinesis of the apex.  Normal LV cavity size.  There was grade 1 diastolic dysfunction.    With improvement in her LV function, we discontinued her lifevest.  She  developed a significant GI illness  ago and lost proximally 10-12 pounds.   At that time, her blood pressure became low and she was taken off her spironolactone and apparently her metoprolol succinate was reduced to just 50 mg.   She was hospitalized from November 30 through 10/24/2014 with pericarditis.  An echo Doppler study done in the hospital showed a moderate free-flowing pericardial effusion circumferential to the heart, without evidence for hemodynamic compromise.  Ejection fraction was 45-50%.  There was grade 1 diastolic dysfunction.  Since she was on dual antiplatelet therapy, she was treated with colchicine and not nonsteroidal anti-inflammatory agents.  She subsequently developed diarrhea with colchicine.  She has been maintained on dual antiplatelet therapy.  She is diabetic.  She has a history of hyperlipidemia and has been taking atorvastatin 40 mg.  A  follow-up echo Doppler study on 01/23/2015 showed her ejection fraction had increased to 55-60%.  She did not have any regional wall motion abnormalities.  There was grade 1 diastolic dysfunction.  There was no evidence for any residual pericardial effusion.  Since I last saw her 6 months ago, she complains of fatigability.  At times he notes a vague discomfort when she lies on her left side at night, but denies any exertionally precipitated chest pain or chest pressure.  She has very mild shortness of breath, which has not changed or progressed.  She denies palpitations. She denies presyncope or syncope.  Past Medical History  Diagnosis Date  . Irritable bowel syndrome   . Hyperlipidemia   . Type 2 diabetes mellitus ([place])   .  CAD (coronary artery disease)     a.  s/p STEMI s/p DES to LAD and RCA (12/2013)  . Pericarditis   . Chronic diastolic CHF (congestive heart failure) ([place])   . Chest pain 10/10/2014  . Myocardial infarction [facility])     Past Surgical History  Procedure Laterality Date  . Coronary angioplasty with stent placement    . Left heart catheterization with coronary angiogram N/A 12/16/2013      Procedure: LEFT HEART CATHETERIZATION WITH CORONARY ANGIOGRAM;  Surgeon: Troy Sine, MD;  Location: [facility] CATH LAB;  Service: Cardiovascular;  Laterality: N/A;  . Percutaneous coronary stent intervention (pci-s) N/A 12/17/2013    Procedure: PERCUTANEOUS CORONARY STENT INTERVENTION (PCI-S);  Surgeon: Troy Sine, MD;  Location: [facility] CATH LAB;  Service: Cardiovascular;  Laterality: N/A;  . Left heart catheterization with coronary angiogram  12/17/2013    Procedure: LEFT HEART CATHETERIZATION WITH CORONARY ANGIOGRAM;  Surgeon: Troy Sine, MD;  Location[facility] C[facility] LAB;  Service: Cardiovascular;;    Allergies  Allergen Reactions  . Actos [Pioglitazone] Swelling  . Ceftin [Cefuroxime Axetil] Nausea Only  . Celebrex [Celecoxib]     Sickness  . Darvocet [Propoxyphene N-Acetaminophen] Nausea Only  . Doxycycline Nausea And Vomiting and Other (See Comments)    Bones ache  . Erythromycin Nausea And Vomiting  . Sulfa Antibiotics Nausea And Vomiting and Other (See Comments)    Bones ache   . Vioxx [Rofecoxib]     Sickness   . Zetia [Ezetimibe]     Side effects,cramps   . Zithromax [Azithromycin] Nausea Only  . Zocor [Simvastatin]     Side effects    Current Outpatient Prescriptions  Medication Sig Dispense Refill  . allopurinol (ZYLOPRIM) 300 MG tablet Take 300 mg by mouth daily.    [name] ALPRAZolam (XANAX) 0.5 MG tablet TAKE ONE TABLET TWICE DAILY AS NEEDED 60 tablet 4  . amoxicillin (AMOXIL) 500 MG tablet Take 1 tablet (500 mg total) by mouth 3 (three) times daily. 30 tablet 0  . aspirin EC 81 MG EC tablet Take 1 tablet (81 mg total) by mouth daily.    [name] atorvastatin (LIPITOR) 40 MG tablet TAKE 1 TABLET DAILY AT 6PM 30 tablet 5  . diphenoxylate-atropine (LOMOTIL) 2.5-0.025 MG per tablet Take 1 tablet by mouth 3 (three) times daily as needed for diarrhea or loose stools. 20 tablet 0  . fluticasone (FLONASE) 50 MCG/ACT nasal spray USE 2 SPRAYS IN EACH NOSTRIL DAILY 16 g 4  .  HYDROcodone-acetaminophen (NORCO/VICODIN) 5-325 MG tablet Take 1 tablet by mouth every 6 (six) hours as needed. 120 tablet 0  . lisinopril (PRINIVIL,ZESTRIL) 2.5 MG tablet TAKE ONE (1) TABLET EACH DAY 30 tablet 4  . loratadine (CLARITIN) 10 MG tablet TAKE ONE (1) TABLET EACH DAY 30 tablet 5  . metFORMIN (GLUCOPHAGE) 500 MG tablet TAKE ONE TABLET TWICE A DAY WITH FOOD 60 tablet 5  . metoprolol succinate (TOPROL XL) 100 MG 24 hr tablet Take 1/2 tablet twice a day. 30 tablet 11  . Multiple Vitamin (MULTIVITAMIN) tablet Take 1 tablet by mouth daily.    [name] NEXIUM 40 MG capsule TAKE ONE CAPSULE EACH MORNING 30 capsule 5  . nitroGLYCERIN (NITROSTAT) 0.4 MG SL tablet Place 1 tablet (0.4 mg total) under the tongue every 5 (five) minutes x 3 doses as needed for chest pain. 25 tablet 12  . omeprazole (PRILOSEC) 40 MG capsule Take 1 capsule (40 mg total) by mouth daily. 30 capsule 5  . ondansetron (  ZOFRAN) 8 MG tablet TAKE 1 TABLET EVERY 8 HOURS AS NEEDED FOR NAUSEA AND VOMITING 21 tablet 5  . PREDNISONE PO Take by mouth.    . ticagrelor (BRILINTA) 60 MG TABS tablet Take 1 tablet (60 mg total) by mouth 2 (two) times daily. 60 tablet 11  . vitamin C (ASCORBIC ACID) 500 MG tablet Take 500 mg by mouth daily.     No current facility-administered medications for this visit.    Social History   Social History  . Marital Status: Widowed    Spouse Name: N/A  . Number of Children: N/A  . Years of Education: N/A   Occupational History  . Not on file.   Social History Main Topics  . Smoking status: Never Smoker   . Smokeless tobacco: Not on file  . Alcohol Use: No  . Drug Use: No  . Sexual Activity: Not on file   Other Topics Concern  . Not on file   Social History Narrative   Socially, she is widowed for 8 years.  She has 4 children and 3 grandchildren.  She completed eighth grade of education.  There is no history of tobacco use.  Family History  Problem Relation Age of Onset  . Diabetes Mother    . Diabetes Daughter   . Diabetes Son     ROS General: Negative; No fevers, chills, or night sweats;  HEENT:  Positive for recent tooth abscess for which he did undergo antibiotic therapy.  no changes in vision or hearing, sinus congestion, difficulty swallowing Pulmonary: Negative; No cough, wheezing, shortness of breath, hemoptysis Cardiovascular: Negative; No chest pain, presyncope, syncope, palpatations GI: Recent GI illness, resolved ; No nausea, vomiting, diarrhea, or abdominal pain GU: Negative; No dysuria, hematuria, or difficulty voiding Musculoskeletal: Positive for occasional arthralgias of her knees and hips; no myalgias, or weakness Hematologic/Oncology: Negative; no easy bruising, bleeding Endocrine: Positive for type 2 diabetes mellitus.; no heat/cold intolerance Neuro: Negative; no changes in balance, headaches Skin: Negative; No rashes or skin lesions Psychiatric: Negative; No behavioral problems, depression Sleep: Negative; No snoring, daytime sleepiness, hypersomnolence, bruxism, restless legs, hypnogognic hallucinations, no cataplexy Other comprehensive 14 point system review is negative   PE BP 110/60 mmHg  Pulse 86  Ht 5\' 3"  (1.6 m)  Wt 145 lb 3 oz (65.857 kg)  BMI 25.73 kg/m2   Wt Readings from Last 3 Encounters:  12/04/15 145 lb 3 oz (65.857 kg)  11/22/15 145 lb (65.772 kg)  08/29/15 146 lb 14.4 oz (66.633 kg)   General: Alert, oriented, no distress.  Skin: normal turgor, no rashes HEENT: Normocephalic, atraumatic. Pupils round and reactive; sclera anicteric;no lid lag. Extraocular muscles intact;; no xanthelasmas. Nose without nasal septal hypertrophy Mouth/Parynx benign; Mallinpatti scale 3 Neck: No JVD, no carotid bruits; normal carotid upstroke Lungs: clear to ausculatation and percussion; no wheezing or rales Chest wall: no tenderness to palpitation Heart: RRR, s1 s2 normal; 1/6 systolic murmur.  no diastolic murmur, rub thrills or  heaves Abdomen: soft, nontender; no hepatosplenomehaly, BS+; abdominal aorta nontender and not dilated by palpation. Back: no CVA tenderness Pulses 2+ Extremities: no clubbing cyanosis or edema, Homan's sign negative  Neurologic: grossly nonfocal; cranial nerves grossly normal. Psychologic: normal affect and mood.  ECG (independently read by me): Normal sinus rhythm at 86 bpm.  Normal intervals.  No significant ST segment changes.  July 2016 ECG (independently read by me): Normal sinus rhythm at 77 bpm.  No ectopy.  Normal intervals.  March 2016 ECG (  independently read by me): Normal sinus rhythm at 86 bpm.  Nonspecific T changes.  Normal intervals.  07/19/2014 ECG (independently read by me): Sinus rhythm at 87 beats per minute.  Isolated PVC.  QTc interval 460 ms.  Nonspecific ST changes.  03/14/2014 ECG (independently read by me): Sinus rhythm at 87 beats per minute.  Preserved anterior R waves, but with corneal T-wave inversion  Prior 01/31/2014 ECG (independently read by me): Sinus rhythm at 104 beats per minute with T-wave inversion anterolaterally V2 to V6, as well as lead 1 and L., concordant with her prior myocardial infarction.  LABS:  BMET  BMP Latest Ref Rng 08/06/2015 02/21/2015 10/13/2014  Glucose 65 - 99 mg/dL 161(H) 131(H) 207(H)  BUN 6 - 20 mg/dL 26(H) 17 21  Creatinine 0.44 - 1.00 mg/dL 1.36(H) 1.00 1.50(H)  BUN/Creat Ratio 11 - 26 - 17 -  Sodium 135 - 145 mmol/L 140 142 138  Potassium 3.5 - 5.1 mmol/L 4.0 3.9 4.2  Chloride 101 - 111 mmol/L 107 101 98  CO2 22 - 32 mmol/L 21(L) 24 26  Calcium 8.9 - 10.3 mg/dL 9.6 9.6 9.2       Component Value Date/Time   PROT 6.3 10/09/2015 1403   ALBUMIN 3.9 10/09/2015 1403   AST 11 10/09/2015 1403   ALT 9 10/09/2015 1403   ALKPHOS 133* 10/09/2015 1403   BILITOT 0.3 10/09/2015 1403   BILIDIR 0.1 10/09/2015 1403   IBILI 0.2 10/09/2015 1403     CBC Latest Ref Rng 08/06/2015 10/13/2014 10/12/2014  WBC 4.0 - 10.5 K/uL 10.3 6.8  7.6  Hemoglobin 12.0 - 15.0 g/dL 10.6(L) 10.9(L) 10.4(L)  Hematocrit 36.0 - 46.0 % 33.2(L) 33.9(L) 31.3(L)  Platelets 150 - 400 K/uL 306 456(H) 366   Lab Results  Component Value Date   MCV 95.7 08/06/2015   MCV 86.0 10/13/2014   MCV 87.7 10/12/2014    Lab Results  Component Value Date   TSH 1.790 02/21/2015   Lab Results  Component Value Date   HGBA1C 6.2 11/22/2015    BNP    Component Value Date/Time   PROBNP 1454.0* 10/11/2014 0900    Lipid Panel     Component Value Date/Time   CHOL 147 10/09/2015 1403   TRIG 199* 10/09/2015 1403   HDL 40* 10/09/2015 1403   CHOLHDL 3.7 10/09/2015 1403   VLDL 40* 10/09/2015 1403   LDLCALC 67 10/09/2015 1403     RADIOLOGY: No results found.    ASSESSMENT AND PLAN: Elizabeth Aguilar is a 75 year old female who suffered an  anterolateral STEMI on December 16, 2013 secondary to total occlusion of the LAD.  She had high-grade concomitant CAD and also underwent staged intervention to her RCA.  She has been without recurrent anginal symptoms.  Pre-discharge ejection fraction was 25%, and this has subsequently normalized.  Her last echo reveals normal LV function.  Presently, she denies any pleuritic symptoms.  Her last echo did not reveal any residual pericardial effusion.  She is now on reduced dose of Brilinta per Pegasys trial and denies any bleeding.  He'll either are no signs of edema.  She complains of fatigue.  I have recommended she discontinue the hydrochlorothiazide, which she has been taken at 12.5 mg daily.  I also have recommended that she change her Toprol-XL from 100 mg in the morning and see if taking this 50 no grams twice a day may be helpful with reference to her fatigue.  I reviewed laboratory from November 2016.  Her LDL is excellent at 67.  Triglycerides were slightly increased.  We discussed improved diet and reduction of carbohydrates.  She is not having any anginal symptoms.  Her BMI is excellent at 25.7 kg/m.  She has  GERD but this has been controlled with Nexium 40 mg daily.  I will see her in 6 months for cardiology evaluation.    Time spent: 25 minutes  KELLY,THOMAS A, MD .12/05/2015 5:48 PM

## 2015-12-06 ENCOUNTER — Encounter: Payer: Self-pay | Admitting: Family Medicine

## 2015-12-08 ENCOUNTER — Telehealth: Payer: Self-pay | Admitting: Family Medicine

## 2015-12-08 NOTE — Telephone Encounter (Signed)
Pt called from [place] stating that Dr. Nicki Reaper was suppose to have a prescription for diabetic shoes faxed to [facility] and states that they have not received it yet. Please advise.

## 2015-12-08 NOTE — Telephone Encounter (Signed)
rx wrote for shoes and form from [place] in your folder to fill out. Nurse part done. Also need copy of ov that describes the need for diabetic shoes and inserts. Fax number on form to be faxed after completed.

## 2015-12-11 NOTE — Telephone Encounter (Signed)
Form completed.

## 2015-12-26 DIAGNOSIS — Z79899 Other long term (current) drug therapy: Secondary | ICD-10-CM | POA: Diagnosis not present

## 2015-12-28 ENCOUNTER — Telehealth: Payer: Self-pay | Admitting: Family Medicine

## 2015-12-28 NOTE — Telephone Encounter (Signed)
Rx for testing strips faxed to [place] in [place]. Patient notified.

## 2015-12-28 NOTE — Telephone Encounter (Signed)
Pt is needing her strips called in for her blood sugar machine. The machine is a easy max. Pt states that [place] will know what kind of strips.     [employer]

## 2015-12-29 ENCOUNTER — Telehealth: Payer: Self-pay | Admitting: Family Medicine

## 2015-12-29 NOTE — Telephone Encounter (Signed)
Pt wants to know if she can take the prednisone. 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 Pt states that Dr. Matthew Saras wanted her to call and see if she can take the prednisone.

## 2015-12-29 NOTE — Telephone Encounter (Signed)
Discussed with patient. Patient advised she can do a prednisone taper but Dr Nicki Reaper would not recommend long-term prednisone-while patient is on prednisone she will need to be careful with carbohydrate intake. Patient verbalized understanding.

## 2015-12-29 NOTE — Telephone Encounter (Signed)
She can do a prednisone taper but I would not recommend long-term prednisone-while patient is on prednisone she will need to be careful with carbohydrate intake

## 2015-12-29 NOTE — Telephone Encounter (Signed)
Left message to return call 

## 2016-01-26 ENCOUNTER — Other Ambulatory Visit: Payer: Self-pay | Admitting: Family Medicine

## 2016-01-26 NOTE — Telephone Encounter (Signed)
Six ref 

## 2016-02-21 DIAGNOSIS — M79671 Pain in right foot: Secondary | ICD-10-CM | POA: Diagnosis not present

## 2016-02-21 DIAGNOSIS — M25561 Pain in right knee: Secondary | ICD-10-CM | POA: Diagnosis not present

## 2016-02-21 DIAGNOSIS — M19241 Secondary osteoarthritis, right hand: Secondary | ICD-10-CM | POA: Diagnosis not present

## 2016-02-21 DIAGNOSIS — M1A00X0 Idiopathic chronic gout, unspecified site, without tophus (tophi): Secondary | ICD-10-CM | POA: Diagnosis not present

## 2016-02-23 ENCOUNTER — Other Ambulatory Visit: Payer: Self-pay | Admitting: Family Medicine

## 2016-02-25 NOTE — Telephone Encounter (Signed)
May refill all of these 1 with for additional refills thank you

## 2016-02-27 DIAGNOSIS — R5381 Other malaise: Secondary | ICD-10-CM | POA: Diagnosis not present

## 2016-03-06 DIAGNOSIS — C44622 Squamous cell carcinoma of skin of right upper limb, including shoulder: Secondary | ICD-10-CM | POA: Diagnosis not present

## 2016-03-06 DIAGNOSIS — D485 Neoplasm of uncertain behavior of skin: Secondary | ICD-10-CM | POA: Diagnosis not present

## 2016-03-06 DIAGNOSIS — Z85828 Personal history of other malignant neoplasm of skin: Secondary | ICD-10-CM | POA: Diagnosis not present

## 2016-03-08 ENCOUNTER — Ambulatory Visit: Payer: Medicare Other | Admitting: Family Medicine

## 2016-03-08 ENCOUNTER — Encounter: Payer: Self-pay | Admitting: Family Medicine

## 2016-03-08 ENCOUNTER — Ambulatory Visit (INDEPENDENT_AMBULATORY_CARE_PROVIDER_SITE_OTHER): Payer: Medicare Other | Admitting: Family Medicine

## 2016-03-08 VITALS — BP 112/64 | Temp 98.4°F | Ht 63.0 in | Wt 144.0 lb

## 2016-03-08 DIAGNOSIS — E785 Hyperlipidemia, unspecified: Secondary | ICD-10-CM

## 2016-03-08 DIAGNOSIS — M25559 Pain in unspecified hip: Secondary | ICD-10-CM | POA: Diagnosis not present

## 2016-03-08 DIAGNOSIS — R300 Dysuria: Secondary | ICD-10-CM

## 2016-03-08 DIAGNOSIS — I10 Essential (primary) hypertension: Secondary | ICD-10-CM | POA: Diagnosis not present

## 2016-03-08 DIAGNOSIS — M25569 Pain in unspecified knee: Secondary | ICD-10-CM

## 2016-03-08 DIAGNOSIS — E119 Type 2 diabetes mellitus without complications: Secondary | ICD-10-CM | POA: Diagnosis not present

## 2016-03-08 DIAGNOSIS — Z79891 Long term (current) use of opiate analgesic: Secondary | ICD-10-CM

## 2016-03-08 DIAGNOSIS — M549 Dorsalgia, unspecified: Secondary | ICD-10-CM

## 2016-03-08 DIAGNOSIS — G8929 Other chronic pain: Secondary | ICD-10-CM

## 2016-03-08 DIAGNOSIS — I2510 Atherosclerotic heart disease of native coronary artery without angina pectoris: Secondary | ICD-10-CM | POA: Diagnosis not present

## 2016-03-08 DIAGNOSIS — N289 Disorder of kidney and ureter, unspecified: Secondary | ICD-10-CM

## 2016-03-08 LAB — POCT URINALYSIS DIPSTICK
Spec Grav, UA: <=1.005
pH, UA: 5

## 2016-03-08 LAB — POCT GLYCOSYLATED HEMOGLOBIN (HGB A1C): Hemoglobin A1C: 6.1

## 2016-03-08 MED ORDER — HYDROCODONE-ACETAMINOPHEN 5-325 MG PO TABS: 1.0000 | ORAL_TABLET | Freq: Four times a day (QID) | ORAL | Status: DC | PRN

## 2016-03-08 MED ORDER — METFORMIN HCL ER (OSM) 1000 MG PO TB24: 1000.0000 mg | ORAL_TABLET | Freq: Every day | ORAL | Status: DC

## 2016-03-08 MED ORDER — CIPROFLOXACIN HCL 500 MG PO TABS: 500.0000 mg | ORAL_TABLET | Freq: Two times a day (BID) | ORAL | Status: DC

## 2016-03-08 NOTE — Progress Notes (Signed)
Subjective:    Patient ID: Elizabeth Aguilar, female    DOB: 04/15/1941, 75 y.o.   MRN: [id number]  HPI This patient was seen today for chronic pain  The medication list was reviewed and updated.   -Compliance with medication: takes as prescribed   - Number patient states they take daily: 3 -4 a day  -when was the last dose patient took? yesterday  The patient was advised the importance of maintaining medication and not using illegal substances with these.  Refills needed: yes  The patient was educated that we can provide 3 monthly scripts for their medication, it is their responsibility to follow the instructions.  Side effects or complications from medications: none  Patient is aware that pain medications are meant to minimize the severity of the pain to allow their pain levels to improve to allow for better function. They are aware of that pain medications cannot totally remove their pain.  Due for UDT ( at least once per year) : done today  Burning with urination. Started about one week ago.   A1C today.  Results for orders placed or performed in visit on 03/08/16  POCT glycosylated hemoglobin (Hb A1C)  Result Value Ref Range   Hemoglobin A1C 6.1      Standard metformin caused gi sxShe relates frequent loose stools but denies any bloody stools She relates recent UTI symptoms burning urination no flank pain no abdominal pain She takes her cholesterol medicine as directed tries watch diet She denies any chest tightness pressure pain she does have a cardiac history she does take her blood pressure medicine and her 81 mg aspirin If she does not take her Nexium she has severe reflux issues Patient [place] do [place] lab work we have asked that her specialists in a copy to [place]    Review of Systems  Constitutional: Negative for activity change, appetite change and fatigue.  HENT: Negative for congestion.     Respiratory: Negative for cough.   Cardiovascular: Negative for chest pain.  Gastrointestinal: Negative for abdominal pain.  Endocrine: Negative for polydipsia and polyphagia.  Neurological: Negative for weakness.  Psychiatric/Behavioral: Negative for confusion.       Objective:   Physical Exam  Constitutional: She appears well-nourished. No distress.  Cardiovascular: Normal rate, regular rhythm and normal heart sounds.   No murmur heard. Pulmonary/Chest: Effort normal and breath sounds normal. No respiratory distress.  Musculoskeletal: She exhibits no edema.  Lymphadenopathy:    She has no cervical adenopathy.  Neurological: She is alert. She exhibits normal muscle tone.  Psychiatric: Her behavior is normal.  Vitals reviewed.   25 minutes was spent with the patient. Greater than half the time was spent in discussion and answering questions and counseling regarding the issues that the patient came in for today.       Assessment & Plan:  Change to er metformin Diabetes fairly good control but she does not tolerate the current metformin's of therefore we will change it to the extended release  She does have chronic pain she uses her medicines responsibly 3 prescriptions were given she will follow-up in 3 months urine drug screen completed today  Urinary tract infection Cipro twice a day for the next 7 days if high fever severe flank pain or worse follow-up Hyperlipidemia continue atorvastatin previous lab work reviewed patient does good job of taking her medicine Hypertension taken her medicine as directed doing  good job with this does mean metabolic 7 Patient takes her Nexium for GERD she states if she doesn't take it she has severe reflux issues

## 2016-03-14 LAB — TOXASSURE SELECT 13 (MW), URINE

## 2016-03-24 ENCOUNTER — Other Ambulatory Visit: Payer: Self-pay | Admitting: Family Medicine

## 2016-03-27 DIAGNOSIS — R5381 Other malaise: Secondary | ICD-10-CM | POA: Diagnosis not present

## 2016-03-27 DIAGNOSIS — Z79899 Other long term (current) drug therapy: Secondary | ICD-10-CM | POA: Diagnosis not present

## 2016-04-05 ENCOUNTER — Other Ambulatory Visit: Payer: Self-pay | Admitting: Family Medicine

## 2016-04-23 ENCOUNTER — Other Ambulatory Visit: Payer: Self-pay | Admitting: Family Medicine

## 2016-04-23 ENCOUNTER — Other Ambulatory Visit: Payer: Self-pay | Admitting: Cardiovascular Disease

## 2016-04-23 NOTE — Telephone Encounter (Signed)
Rx Refill

## 2016-04-23 NOTE — Telephone Encounter (Signed)
Six mo worth all 

## 2016-05-03 ENCOUNTER — Encounter: Payer: Self-pay | Admitting: Family Medicine

## 2016-05-03 ENCOUNTER — Ambulatory Visit (INDEPENDENT_AMBULATORY_CARE_PROVIDER_SITE_OTHER): Payer: Medicare Other | Admitting: Family Medicine

## 2016-05-03 VITALS — BP 130/82 | Ht 63.0 in | Wt 144.2 lb

## 2016-05-03 DIAGNOSIS — S40021A Contusion of right upper arm, initial encounter: Secondary | ICD-10-CM

## 2016-05-03 DIAGNOSIS — I2510 Atherosclerotic heart disease of native coronary artery without angina pectoris: Secondary | ICD-10-CM

## 2016-05-03 NOTE — Progress Notes (Signed)
   Subjective:    Patient ID: Elizabeth Aguilar, female    DOB: 03/12/1941, 75 y.o.   MRN: [id number]  HPI  Patient arrives for a follow up on fall Wednesday. Patient fell and hit lawn chair-busted lip and bruise on right arm Patient states she fell she denied loss of consciousness she relates some bleeding that she's been doing washings on it on a daily basis no sign of any infection she also states that she is actually doing much better but she describes large bruising that she was concerned about in her right arm. Denies shortness of breath or chest pressure pain or discomfort or fever denies dizziness Review of Systems See above    Objective:   Physical Exam  Contusion noted on the lower lip underneath the lip where there was a cut but no sign of infection large bruise noted on the right arm with a hematoma no sign of DVT no swelling in the distal arm has good strength in the bicep bruising noted on the left elbow also bruising noted on the left knee no sign of a fracture though.      Assessment & Plan:  Multiple contusions along with a small cut should gradually get better it'll take several weeks for the bruising to absorbing heal up patient was instructed to follow-up if ongoing troubles

## 2016-05-22 ENCOUNTER — Other Ambulatory Visit: Payer: Self-pay | Admitting: Family Medicine

## 2016-06-07 ENCOUNTER — Encounter: Payer: Self-pay | Admitting: Family Medicine

## 2016-06-07 ENCOUNTER — Ambulatory Visit (INDEPENDENT_AMBULATORY_CARE_PROVIDER_SITE_OTHER): Payer: Medicare Other | Admitting: Family Medicine

## 2016-06-07 VITALS — BP 110/64 | Ht 63.0 in | Wt 143.0 lb

## 2016-06-07 DIAGNOSIS — I2510 Atherosclerotic heart disease of native coronary artery without angina pectoris: Secondary | ICD-10-CM

## 2016-06-07 DIAGNOSIS — M1990 Unspecified osteoarthritis, unspecified site: Secondary | ICD-10-CM | POA: Diagnosis not present

## 2016-06-07 DIAGNOSIS — M549 Dorsalgia, unspecified: Secondary | ICD-10-CM

## 2016-06-07 DIAGNOSIS — E119 Type 2 diabetes mellitus without complications: Secondary | ICD-10-CM

## 2016-06-07 DIAGNOSIS — I10 Essential (primary) hypertension: Secondary | ICD-10-CM | POA: Diagnosis not present

## 2016-06-07 DIAGNOSIS — E785 Hyperlipidemia, unspecified: Secondary | ICD-10-CM | POA: Diagnosis not present

## 2016-06-07 DIAGNOSIS — G8929 Other chronic pain: Secondary | ICD-10-CM | POA: Diagnosis not present

## 2016-06-07 DIAGNOSIS — K219 Gastro-esophageal reflux disease without esophagitis: Secondary | ICD-10-CM | POA: Diagnosis not present

## 2016-06-07 LAB — POCT GLYCOSYLATED HEMOGLOBIN (HGB A1C): HEMOGLOBIN A1C: 5.3

## 2016-06-07 MED ORDER — METFORMIN HCL 500 MG PO TABS: ORAL_TABLET | ORAL | 5 refills | Status: DC

## 2016-06-07 MED ORDER — RANITIDINE HCL 300 MG PO TABS: 300.0000 mg | ORAL_TABLET | Freq: Every day | ORAL | 5 refills | Status: DC

## 2016-06-07 MED ORDER — HYDROCODONE-ACETAMINOPHEN 5-325 MG PO TABS
1.0000 | ORAL_TABLET | Freq: Four times a day (QID) | ORAL | 0 refills | Status: DC | PRN
Start: 2016-06-07 — End: 2016-06-07

## 2016-06-07 MED ORDER — HYDROCODONE-ACETAMINOPHEN 5-325 MG PO TABS: 1.0000 | ORAL_TABLET | Freq: Four times a day (QID) | ORAL | 0 refills | Status: DC | PRN

## 2016-06-07 NOTE — Progress Notes (Signed)
Subjective:    Patient ID: Elizabeth Aguilar, female    DOB: 12-06-1940, 75 y.o.   MRN: [id number]  HPI This patient was seen today for chronic pain. Takes for back pain and hip pain. Also has knee pain.   The medication list was reviewed and updated.   -Compliance with medication: yes  - Number patient states they take daily: depends on the day. No more than prescribed.   -when was the last dose patient took? today  The patient was advised the importance of maintaining medication and not using illegal substances with these.  Refills needed: yes  The patient was educated that we can provide 3 monthly scripts for their medication, it is their responsibility to follow the instructions.  Side effects or complications from medications: none  Patient is aware that pain medications are meant to minimize the severity of the pain to allow their pain levels to improve to allow for better function. They are aware of that pain medications cannot totally remove their pain.  Due for UDT ( at least once per year) : done on 03/08/16  Diabetes. A1C today.    The patient is doing very well on her diabetes. I discussed with the patient we can reduce her metformin she denies any high sugar spells or low sugar spells  She has chronic pain in her low back hips and her ankles related to her arthritis and gout along with chronic low back she denies abusing her pain medicine takes 3 or 4 per day she states occasionally she does not take her medicine if she is not hurting his back. Review the drug registry shows that she gets her medicine every 31 days  Reflux issues she takes PPI she is wondering if she can go to something with less side effects we discussed trying Zantac over the course of next month she will let us know if any problems  History of heart disease her blood pressure is doing well recent cholesterol reviewed with the patient that is doing well also she is compliant with her  medicines.   Review of Systems  Constitutional: Negative for activity change and appetite change.  Gastrointestinal: Negative for abdominal pain and vomiting.  Neurological: Negative for weakness.  Psychiatric/Behavioral: Negative for confusion.       Objective:   Physical Exam  Constitutional: She appears well-nourished. No distress.  HENT:  Head: Normocephalic.  Cardiovascular: Normal rate, regular rhythm and normal heart sounds.   No murmur heard. Pulmonary/Chest: Effort normal and breath sounds normal.  Musculoskeletal: She exhibits no edema.  Lymphadenopathy:    She has no cervical adenopathy.  Neurological: She is alert.  Psychiatric: Her behavior is normal.  Vitals reviewed.         Assessment & Plan:  1. Type 2 diabetes mellitus without complication, unspecified long term insulin use status ([place]) See discussion above reduce metformin to 1 a day - POCT glycosylated hemoglobin (Hb A1C)  2. Hyperlipemia Previous labs reviewed with patient repeat it again in approximately 3-6 months continue medicine  3. Chronic back pain Pain medication or 4 per day see discussion above The patient was seen today as part of a comprehensive visit regarding pain control. Patient's compliance with the medication as well as discussion regarding effectiveness was completed. Prescriptions were written. Patient was advised to follow-up in 3 months. The patient was assessed for any signs of severe side effects. The patient was advised to take the medicine as directed and to report to [place]  if any side effect issues.   4. Arthritis She follows with arthritis specialist recent lab work reviewed  5. Gastroesophageal reflux disease without esophagitis Trying H2 blocker ranitidine 300 mg daily in place of PPI patient will let us know if this is not working well  6. Essential hypertension, benign Blood pressure good control continue current measures 25 minutes was spent with the patient.  Greater than half the time was spent in discussion and answering questions and counseling regarding the issues that the patient came in for today.

## 2016-06-12 ENCOUNTER — Telehealth: Payer: Self-pay | Admitting: Family Medicine

## 2016-06-12 MED ORDER — GENTAMICIN SULFATE 0.3 % OP SOLN: 2.0000 [drp] | Freq: Four times a day (QID) | OPHTHALMIC | 0 refills | Status: AC

## 2016-06-12 MED ORDER — AMOXICILLIN 500 MG PO CAPS: 500.0000 mg | ORAL_CAPSULE | Freq: Three times a day (TID) | ORAL | 0 refills | Status: DC

## 2016-06-12 NOTE — Telephone Encounter (Signed)
Reviewed with Dr Richardson Landry -Per Dr Richardson Landry: Amoxil 500mg  TID for 10 days and Garamycin opthalmic 2 drops QID for 5 days. Meds sent electronically to pharmacy. Patient notified.

## 2016-06-12 NOTE — Telephone Encounter (Signed)
Patient has a sinus infection and her eyes feel like she has sand in them and they are sticky.  Her nose is running, she has a scratchy throat and she is sneezing bad.  She has been like this since last Saturday.  She wants to know if we can call something in for her since she was just seen on 06/07/16?  She is requesting eye drops.    [facility]

## 2016-06-17 DIAGNOSIS — H1033 Unspecified acute conjunctivitis, bilateral: Secondary | ICD-10-CM | POA: Diagnosis not present

## 2016-06-17 DIAGNOSIS — H25813 Combined forms of age-related cataract, bilateral: Secondary | ICD-10-CM | POA: Diagnosis not present

## 2016-07-17 DIAGNOSIS — Z79899 Other long term (current) drug therapy: Secondary | ICD-10-CM | POA: Diagnosis not present

## 2016-07-17 DIAGNOSIS — R5381 Other malaise: Secondary | ICD-10-CM | POA: Diagnosis not present

## 2016-07-19 ENCOUNTER — Other Ambulatory Visit: Payer: Self-pay

## 2016-07-19 ENCOUNTER — Other Ambulatory Visit: Payer: Self-pay | Admitting: Cardiovascular Disease

## 2016-07-19 ENCOUNTER — Other Ambulatory Visit: Payer: Self-pay | Admitting: Family Medicine

## 2016-07-19 MED ORDER — TICAGRELOR 60 MG PO TABS: 60.0000 mg | ORAL_TABLET | Freq: Two times a day (BID) | ORAL | 6 refills | Status: DC

## 2016-07-23 ENCOUNTER — Ambulatory Visit (INDEPENDENT_AMBULATORY_CARE_PROVIDER_SITE_OTHER): Payer: Medicare Other | Admitting: Cardiovascular Disease

## 2016-07-23 ENCOUNTER — Encounter: Payer: Self-pay | Admitting: Cardiovascular Disease

## 2016-07-23 VITALS — BP 110/60 | HR 82 | Ht 62.0 in | Wt 143.0 lb

## 2016-07-23 DIAGNOSIS — I2102 ST elevation (STEMI) myocardial infarction involving left anterior descending coronary artery: Secondary | ICD-10-CM | POA: Diagnosis not present

## 2016-07-23 DIAGNOSIS — I2109 ST elevation (STEMI) myocardial infarction involving other coronary artery of anterior wall: Secondary | ICD-10-CM | POA: Diagnosis not present

## 2016-07-23 DIAGNOSIS — I10 Essential (primary) hypertension: Secondary | ICD-10-CM | POA: Diagnosis not present

## 2016-07-23 DIAGNOSIS — E785 Hyperlipidemia, unspecified: Secondary | ICD-10-CM | POA: Diagnosis not present

## 2016-07-23 DIAGNOSIS — I2510 Atherosclerotic heart disease of native coronary artery without angina pectoris: Secondary | ICD-10-CM

## 2016-07-23 NOTE — Patient Instructions (Signed)
Your physician wants you to follow-up in: 6 to [place] will receive a reminder letter in the mail two months in advance. If you don't receive a letter, please call our office to schedule the follow-up appointment.   If you need a refill on your cardiac medications before your next appointment, please call your pharmacy.

## 2016-07-24 ENCOUNTER — Encounter: Payer: Self-pay | Admitting: Cardiovascular Disease

## 2016-07-24 NOTE — Progress Notes (Signed)
Patient ID: Elizabeth Aguilar, female   DOB: 13-Jul-1941, 75 y.o.   MRN: [id number]    Primary M.D.: Dr. Sallee Lange  HPI: Elizabeth Aguilar is a 75 y.o. female who presents for an 8 month cardiology follow-up evaluation.    Elizabeth Aguilar suffered an ST segment elevation myocardial infarction and was transported from [facility] on 12/16/2013 in the setting of anterolateral MI.  She was taken acutely to the catheterization laboratory, which showed acute LV dysfunction with an ejection fraction of 35-40% with severe hypokinesis involving the mid distal anterolateral wall, apex, and extending to involve the apical, inferior segment.  She had 2+ angiographic mitral regurgitation.  She underwent successful stenting of her total occluded LAD and a 3.0x24 mm Promus premier DES stent was inserted. Due to high grade concomitant CAD, she was brought back to the cardiac catheterization laboratory the following day where LAD stent was widely patent.  Her high-grade focal RCA stenosis of greater than 90% was successfully treated with with ultimate stenting with a 3.0x24 mm Promus premier DES stent, postdilated to 3.26 mm.  She has been on aspirin/Brilinta for dual antiplatelet therapy.  She was discharged with a Life-vest, February 7 she did have a 5 beat run of VT. Her pre-discharge her ejection fraction was 25%.  When I saw her in March 2015, her resting pulse was 104 beats per minute, and I further titrated Toprol-XL to 100 mg in the morning and 50 mg at night.  She  felt improved on this increase beta blocker regimen.   An echo Doppler study on 03/03/2014 demonstrated improved LV function  from 25% to 40% and there was evidence for residual hypokinesis of the distal left ventricle with akinesis of the apex.  Normal LV cavity size.  There was grade 1 diastolic dysfunction.    With improvement in her LV function, we discontinued her lifevest.  She  developed a significant GI illness  ago and lost proximally 10-12 pounds.   At that time, her blood pressure became low and she was taken off her spironolactone and apparently her metoprolol succinate was reduced to just 50 mg.   She was hospitalized from November 30 through 10/24/2014 with pericarditis.  An echo Doppler study done in the hospital showed a moderate free-flowing pericardial effusion circumferential to the heart, without evidence for hemodynamic compromise.  Ejection fraction was 45-50%.  There was grade 1 diastolic dysfunction.  Since she was on dual antiplatelet therapy, she was treated with colchicine and not nonsteroidal anti-inflammatory agents.  She subsequently developed diarrhea with colchicine.  She has been maintained on dual antiplatelet therapy.  She is diabetic.  She has a history of hyperlipidemia and has been taking atorvastatin 40 mg.  A  follow-up echo Doppler study on 01/23/2015 showed her ejection fraction had increased to 55-60%.  She did not have any regional wall motion abnormalities.  There was grade 1 diastolic dysfunction.  There was no evidence for any residual pericardial effusion.  When I last saw her 6 months ago, she complained of fatigability.  At times he notes a vague discomfort when she lies on her left side at night, but denies any exertionally precipitated chest pain or chest pressure.  She denies any exertional chest pain symptomatology.  She has noticed shortness of breath with uphill climbing.  She is seeing Dr. Marcie Bal for her arthritis.  She denies any palpitations.  She presents for cardiology evaluation.   Past Medical History:  Diagnosis Date  . CAD (  coronary artery disease)    a.  s/p STEMI s/p DES to LAD and RCA (12/2013)  . Chest pain 10/10/2014  . Chronic diastolic CHF (congestive heart failure) ([place])   . Hyperlipidemia   . Irritable bowel syndrome   . Myocardial infarction ([place])   . Pericarditis   . Type 2 diabetes mellitus ([place])     Past Surgical History:  Procedure Laterality Date  . CORONARY  ANGIOPLASTY WITH STENT PLACEMENT    . LEFT HEART CATHETERIZATION WITH CORONARY ANGIOGRAM N/A 12/16/2013   Procedure: LEFT HEART CATHETERIZATION WITH CORONARY ANGIOGRAM;  Surgeon: Troy Sine, MD;  Location: [facility] CATH LAB;  Service: Cardiovascular;  Laterality: N/A;  . LEFT HEART CATHETERIZATION WITH CORONARY ANGIOGRAM  12/17/2013   Procedure: LEFT HEART CATHETERIZATION WITH CORONARY ANGIOGRAM;  Surgeon: Troy Sine, MD;  Location: [facility] CATH LAB;  Service: Cardiovascular;;  . PERCUTANEOUS CORONARY STENT INTERVENTION (PCI-S) N/A 12/17/2013   Procedure: PERCUTANEOUS CORONARY STENT INTERVENTION (PCI-S);  Surgeon: Troy Sine, MD;  Location: [facility] CATH LAB;  Service: Cardiovascular;  Laterality: N/A;    Allergies  Allergen Reactions  . Actos [Pioglitazone] Swelling  . Ceftin [Cefuroxime Axetil] Nausea Only  . Celebrex [Celecoxib]     Sickness  . Darvocet [Propoxyphene N-Acetaminophen] Nausea Only  . Doxycycline Nausea And Vomiting and Other (See Comments)    Bones ache  . Erythromycin Nausea And Vomiting  . Sulfa Antibiotics Nausea And Vomiting and Other (See Comments)    Bones ache   . Vioxx [Rofecoxib]     Sickness   . Zetia [Ezetimibe]     Side effects,cramps   . Zithromax [Azithromycin] Nausea Only  . Zocor [Simvastatin]     Side effects    Current Outpatient Prescriptions  Medication Sig Dispense Refill  . allopurinol (ZYLOPRIM) 300 MG tablet Take 300 mg by mouth daily.    [name] ALPRAZolam (XANAX) 0.5 MG tablet TAKE ONE TABLET TWICE DAILY AS NEEDED 60 tablet 5  . amoxicillin (AMOXIL) 500 MG capsule Take 1 capsule (500 mg total) by mouth 3 (three) times daily. 30 capsule 0  . aspirin EC 81 MG EC tablet Take 1 tablet (81 mg total) by mouth daily.    [name] atorvastatin (LIPITOR) 40 MG tablet TAKE 1 TABLET DAILY AT 6PM 30 tablet 5  . fluticasone (FLONASE) 50 MCG/ACT nasal spray USE 2 SPRAYS IN EACH NOSTRIL DAILY 16 g 5  . hydrochlorothiazide (HYDRODIURIL) 25 MG tablet TAKE 1/2 TABLET DAILY 15  tablet 5  . HYDROcodone-acetaminophen (NORCO/VICODIN) 5-325 MG tablet Take 1 tablet by mouth every 6 (six) hours as needed. 120 tablet 0  . ketorolac (ACULAR) 0.5 % ophthalmic solution INSTILL ONE DROP IN BOTH EYES FOUR TIMESA DAY 5 mL 3  . lisinopril (PRINIVIL,ZESTRIL) 2.5 MG tablet TAKE ONE (1) TABLET EACH DAY 30 tablet 5  . loratadine (CLARITIN) 10 MG tablet TAKE ONE (1) TABLET EACH DAY 30 tablet 5  . metFORMIN (GLUCOPHAGE) 500 MG tablet Take one every day 30 tablet 5  . metoprolol succinate (TOPROL XL) 100 MG 24 hr tablet Take 1/2 tablet twice a day. 30 tablet 11  . Multiple Vitamin (MULTIVITAMIN) tablet Take 1 tablet by mouth daily.    . nitroGLYCERIN (NITROSTAT) 0.4 MG SL tablet Place 1 tablet (0.4 mg total) under the tongue every 5 (five) minutes x 3 doses as needed for chest pain. 25 tablet 12  . ondansetron (ZOFRAN) 8 MG tablet TAKE 1 TABLET EVERY 8 HOURS AS NEEDED FOR NAUSEA AND VOMITING 15 tablet  5  . ranitidine (ZANTAC) 300 MG tablet Take 1 tablet (300 mg total) by mouth at bedtime. 30 tablet 5  . ticagrelor (BRILINTA) 60 MG TABS tablet Take 1 tablet (60 mg total) by mouth 2 (two) times daily. 60 tablet 6  . vitamin C (ASCORBIC ACID) 500 MG tablet Take 500 mg by mouth daily.     No current facility-administered medications for this visit.     Social History   Social History  . Marital status: Widowed    Spouse name: N/A  . Number of children: N/A  . Years of education: N/A   Occupational History  . Not on file.   Social History Main Topics  . Smoking status: Never Smoker  . Smokeless tobacco: Never Used  . Alcohol use No  . Drug use: No  . Sexual activity: Not on file   Other Topics Concern  . Not on file   Social History Narrative  . No narrative on file   Socially, she is widowed for 9 years.  She has 4 children and 3 grandchildren.  She completed eighth grade of education.  There is no history of tobacco use.  Family History  Problem Relation Age of Onset    . Diabetes Mother   . Diabetes Daughter   . Diabetes Son     ROS General: Negative; No fevers, chills, or night sweats;  HEENT:  Positive for recent tooth abscess for which he did undergo antibiotic therapy.  no changes in vision or hearing, sinus congestion, difficulty swallowing Pulmonary: Negative; No cough, wheezing, shortness of breath, hemoptysis Cardiovascular: Negative; No chest pain, presyncope, syncope, palpatations GI: Recent GI illness, resolved ; No nausea, vomiting, diarrhea, or abdominal pain GU: Negative; No dysuria, hematuria, or difficulty voiding Musculoskeletal: Positive for occasional arthralgias of her knees and hips; no myalgias, or weakness Hematologic/Oncology: Negative; no easy bruising, bleeding Endocrine: Positive for type 2 diabetes mellitus.; no heat/cold intolerance Neuro: Negative; no changes in balance, headaches Skin: Negative; No rashes or skin lesions Psychiatric: Negative; No behavioral problems, depression Sleep: Negative; No snoring, daytime sleepiness, hypersomnolence, bruxism, restless legs, hypnogognic hallucinations, no cataplexy Other comprehensive 14 point system review is negative   PE BP 110/60   Pulse 82   Ht 5\' 2"  (1.575 m)   Wt 143 lb (64.9 kg)   BMI 26.16 kg/m    Repeat blood pressure by me was 110/64.  Wt Readings from Last 3 Encounters:  07/23/16 143 lb (64.9 kg)  06/07/16 143 lb (64.9 kg)  05/03/16 144 lb 3.2 oz (65.4 kg)   General: Alert, oriented, no distress.  Skin: normal turgor, no rashes HEENT: Normocephalic, atraumatic. Pupils round and reactive; sclera anicteric;no lid lag. Extraocular muscles intact;; no xanthelasmas. Nose without nasal septal hypertrophy Mouth/Parynx benign; Mallinpatti scale 3 Neck: No JVD, no carotid bruits; normal carotid upstroke Lungs: clear to ausculatation and percussion; no wheezing or rales Chest wall: no tenderness to palpitation Heart: RRR, s1 s2 normal; 1/6 systolic murmur.  no  diastolic murmur, rub thrills or heaves Abdomen: soft, nontender; no hepatosplenomehaly, BS+; abdominal aorta nontender and not dilated by palpation. Back: no CVA tenderness Pulses 2+ Extremities: no clubbing cyanosis or edema, Homan's sign negative  Neurologic: grossly nonfocal; cranial nerves grossly normal. Psychologic: normal affect and mood.  ECG (independently read by me): Normal sinus rhythm at 82 bpm.  Nonspecific T changes.  Normal intervals.  January 2017 ECG (independently read by me): Normal sinus rhythm at 86 bpm.  Normal intervals.  No  significant ST segment changes.  July 2016 ECG (independently read by me): Normal sinus rhythm at 77 bpm.  No ectopy.  Normal intervals.  March 2016 ECG (independently read by me): Normal sinus rhythm at 86 bpm.  Nonspecific T changes.  Normal intervals.  07/19/2014 ECG (independently read by me): Sinus rhythm at 87 beats per minute.  Isolated PVC.  QTc interval 460 ms.  Nonspecific ST changes.  03/14/2014 ECG (independently read by me): Sinus rhythm at 87 beats per minute.  Preserved anterior R waves, but with corneal T-wave inversion  Prior 01/31/2014 ECG (independently read by me): Sinus rhythm at 104 beats per minute with T-wave inversion anterolaterally V2 to V6, as well as lead 1 and L., concordant with her prior myocardial infarction.  LABS:  BMP Latest Ref Rng & Units 08/06/2015 02/21/2015 10/13/2014  Glucose 65 - 99 mg/dL 161(H) 131(H) 207(H)  BUN 6 - 20 mg/dL 26(H) 17 21  Creatinine 0.44 - 1.00 mg/dL 1.36(H) 1.00 1.50(H)  BUN/Creat Ratio 11 - 26 - 17 -  Sodium 135 - 145 mmol/L 140 142 138  Potassium 3.5 - 5.1 mmol/L 4.0 3.9 4.2  Chloride 101 - 111 mmol/L 107 101 98  CO2 22 - 32 mmol/L 21(L) 24 26  Calcium 8.9 - 10.3 mg/dL 9.6 9.6 9.2       Component Value Date/Time   PROT 6.3 10/09/2015 1403   ALBUMIN 3.9 10/09/2015 1403   AST 11 10/09/2015 1403   ALT 9 10/09/2015 1403   ALKPHOS 133 (H) 10/09/2015 1403   BILITOT 0.3  10/09/2015 1403   BILIDIR 0.1 10/09/2015 1403   IBILI 0.2 10/09/2015 1403     CBC Latest Ref Rng & Units 08/06/2015 10/13/2014 10/12/2014  WBC 4.0 - 10.5 K/uL 10.3 6.8 7.6  Hemoglobin 12.0 - 15.0 g/dL 10.6(L) 10.9(L) 10.4(L)  Hematocrit 36.0 - 46.0 % 33.2(L) 33.9(L) 31.3(L)  Platelets 150 - 400 K/uL 306 456(H) 366   Lab Results  Component Value Date   MCV 95.7 08/06/2015   MCV 86.0 10/13/2014   MCV 87.7 10/12/2014    Lab Results  Component Value Date   TSH 1.790 02/21/2015   Lab Results  Component Value Date   HGBA1C 5.3 06/07/2016    BNP    Component Value Date/Time   PROBNP 1,454.0 (H) 10/11/2014 0900    Lipid Panel     Component Value Date/Time   CHOL 147 10/09/2015 1403   TRIG 199 (H) 10/09/2015 1403   HDL 40 (L) 10/09/2015 1403   CHOLHDL 3.7 10/09/2015 1403   VLDL 40 (H) 10/09/2015 1403   LDLCALC 67 10/09/2015 1403     RADIOLOGY: No results found.    ASSESSMENT AND PLAN: Elizabeth Aguilar is a 75 year old female who suffered an  anterolateral STEMI on December 16, 2013 secondary to total occlusion of the LAD.  She had high-grade concomitant CAD and also underwent staged intervention to her RCA.  She has been without recurrent anginal symptoms.  Pre-discharge ejection fraction was 25%, and this has subsequently normalized.  Her last echo reveals normal LV function.  Presently, she denies any recent chest pain or pleuritic symptoms..  Her last echo did not reveal any residual pericardial effusion.  She is now on reduced dose of Brilinta per Pegasys trial and denies any bleeding.  I last saw her, I discontinued her hydrochlorothiazide.  Due to her fatigability.  I also reduced her Toprol.  Her ECG today remains stable.  Blood pressure is stable on her medical  regimen consisting of Toprol-XL 50 mg and lisinopril 2.5 mg.  Apparently, she has intermittently been taking HCTZ.  She is on atorvastatin 40 mg for hyperlipidemia.  I reviewed recent blood work from 03/27/2016.   She was mildly anemic with hemoglobin of 10.9 and hematocrit 33.8.  Creatinine was 1.09.  She recently had blood work done by her rheumatologist.  As long she remains stable, I will see her in 6-9 months for reevaluation.    Time spent: 25 minutes  Shelva Majestic, MD .07/24/2016 6:11 PM

## 2016-07-25 ENCOUNTER — Encounter: Payer: Self-pay | Admitting: Family Medicine

## 2016-07-25 ENCOUNTER — Ambulatory Visit (INDEPENDENT_AMBULATORY_CARE_PROVIDER_SITE_OTHER): Payer: Medicare Other | Admitting: Family Medicine

## 2016-07-25 VITALS — BP 128/76 | Temp 97.5°F | Ht 62.0 in | Wt 144.2 lb

## 2016-07-25 DIAGNOSIS — B9689 Other specified bacterial agents as the cause of diseases classified elsewhere: Secondary | ICD-10-CM

## 2016-07-25 DIAGNOSIS — I2510 Atherosclerotic heart disease of native coronary artery without angina pectoris: Secondary | ICD-10-CM

## 2016-07-25 DIAGNOSIS — J019 Acute sinusitis, unspecified: Secondary | ICD-10-CM

## 2016-07-25 MED ORDER — LEVOFLOXACIN 500 MG PO TABS: 500.0000 mg | ORAL_TABLET | Freq: Every day | ORAL | 0 refills | Status: DC

## 2016-07-25 MED ORDER — DIPHENOXYLATE-ATROPINE 2.5-0.025 MG PO TABS: 1.0000 | ORAL_TABLET | Freq: Four times a day (QID) | ORAL | 0 refills | Status: DC | PRN

## 2016-07-25 NOTE — Progress Notes (Signed)
   Subjective:    Patient ID: Elizabeth Aguilar, female    DOB: 1941/04/07, 75 y.o.   MRN: [id number]  Sinus Problem  This is a new problem. The current episode started 1 to 4 weeks ago. Associated symptoms include congestion, coughing, ear pain, headaches, sinus pressure and a sore throat. Pertinent negatives include no shortness of breath. Treatments tried: amoxil.    Patient denies any chest pressure tightness pain or discomfort no heart attack symptoms  Review of Systems  Constitutional: Negative for activity change and fever.  HENT: Positive for congestion, ear pain, rhinorrhea, sinus pressure and sore throat.   Eyes: Negative for discharge.  Respiratory: Positive for cough. Negative for shortness of breath and wheezing.   Cardiovascular: Negative for chest pain.  Neurological: Positive for headaches.       Objective:   Physical Exam  Constitutional: She appears well-developed.  HENT:  Head: Normocephalic.  Nose: Nose normal.  Mouth/Throat: Oropharynx is clear and moist. No oropharyngeal exudate.  Neck: Neck supple.  Cardiovascular: Normal rate and normal heart sounds.   No murmur heard. Pulmonary/Chest: Effort normal and breath sounds normal. She has no wheezes.  Lymphadenopathy:    She has no cervical adenopathy.  Skin: Skin is warm and dry.  Nursing note and vitals reviewed.         Assessment & Plan:  Patient was seen today for upper respiratory illness. It is felt that the patient is dealing with sinusitis. Antibiotics were prescribed today. Importance of compliance with medication was discussed. Symptoms should gradually resolve over the course of the next several days. If high fevers, progressive illness, difficulty breathing, worsening condition or failure for symptoms to improve over the next several days then the patient is to follow-up. If any emergent conditions the patient is to follow-up in the emergency department otherwise to follow-up in the office.

## 2016-07-30 ENCOUNTER — Telehealth: Payer: Self-pay | Admitting: Family Medicine

## 2016-07-30 NOTE — Telephone Encounter (Signed)
We sent record request to Dr. Arlean Hopping office requesting labs from September 2017.  They reported back today that there are no labs within the requested date range.

## 2016-08-09 DIAGNOSIS — H04123 Dry eye syndrome of bilateral lacrimal glands: Secondary | ICD-10-CM | POA: Diagnosis not present

## 2016-08-09 DIAGNOSIS — H25813 Combined forms of age-related cataract, bilateral: Secondary | ICD-10-CM | POA: Diagnosis not present

## 2016-08-09 DIAGNOSIS — H1033 Unspecified acute conjunctivitis, bilateral: Secondary | ICD-10-CM | POA: Diagnosis not present

## 2016-08-27 ENCOUNTER — Ambulatory Visit (INDEPENDENT_AMBULATORY_CARE_PROVIDER_SITE_OTHER): Payer: Medicare Other | Admitting: Rheumatology

## 2016-08-27 DIAGNOSIS — M19071 Primary osteoarthritis, right ankle and foot: Secondary | ICD-10-CM

## 2016-08-27 DIAGNOSIS — M1A00X0 Idiopathic chronic gout, unspecified site, without tophus (tophi): Secondary | ICD-10-CM

## 2016-08-27 DIAGNOSIS — M1611 Unilateral primary osteoarthritis, right hip: Secondary | ICD-10-CM | POA: Diagnosis not present

## 2016-08-27 DIAGNOSIS — M533 Sacrococcygeal disorders, not elsewhere classified: Secondary | ICD-10-CM

## 2016-08-27 DIAGNOSIS — M170 Bilateral primary osteoarthritis of knee: Secondary | ICD-10-CM

## 2016-08-27 DIAGNOSIS — M19041 Primary osteoarthritis, right hand: Secondary | ICD-10-CM

## 2016-08-29 ENCOUNTER — Ambulatory Visit (INDEPENDENT_AMBULATORY_CARE_PROVIDER_SITE_OTHER): Payer: Medicare Other | Admitting: Family Medicine

## 2016-08-29 ENCOUNTER — Encounter: Payer: Self-pay | Admitting: Family Medicine

## 2016-08-29 VITALS — BP 126/80 | Temp 98.0°F | Ht 62.0 in | Wt 145.2 lb

## 2016-08-29 DIAGNOSIS — R7301 Impaired fasting glucose: Secondary | ICD-10-CM

## 2016-08-29 DIAGNOSIS — E119 Type 2 diabetes mellitus without complications: Secondary | ICD-10-CM

## 2016-08-29 DIAGNOSIS — I2510 Atherosclerotic heart disease of native coronary artery without angina pectoris: Secondary | ICD-10-CM | POA: Diagnosis not present

## 2016-08-29 DIAGNOSIS — D509 Iron deficiency anemia, unspecified: Secondary | ICD-10-CM | POA: Diagnosis not present

## 2016-08-29 DIAGNOSIS — R300 Dysuria: Secondary | ICD-10-CM

## 2016-08-29 LAB — POCT URINALYSIS DIPSTICK
Spec Grav, UA: <=1.005
pH, UA: 6

## 2016-08-29 MED ORDER — KETOCONAZOLE 2 % EX CREA: 1.0000 "application " | TOPICAL_CREAM | Freq: Two times a day (BID) | CUTANEOUS | 4 refills | Status: DC

## 2016-08-29 MED ORDER — CEPHALEXIN 500 MG PO CAPS: 500.0000 mg | ORAL_CAPSULE | Freq: Four times a day (QID) | ORAL | 0 refills | Status: AC

## 2016-08-29 NOTE — Progress Notes (Signed)
   Subjective:    Patient ID: Elizabeth Aguilar, female    DOB: [birth year], 75 y.o.   MRN: [id number]  Urinary Tract Infection   This is a new problem. The current episode started in the past 7 days. The problem occurs intermittently. The problem has been unchanged. The quality of the pain is described as burning. The pain is moderate. She has tried nothing for the symptoms. The treatment provided no relief.    Patient states that her feet are blistering and peeling also. Onset 4 weeks ago.   patient denies any other particular type of problems currently she states bottom of her feet have been peeling recently she also relates that her arthritis doctor did lab work her sugar was elevated it concerned her A1c in the past is been normal  Review of Systems  peeling on the bottom of the feet denies fever chills does relate dysuria urinary frequency denies chest tightness shortness of breath cough vomiting    Objective:   Physical Exam  lungs are clear hearts regular abdomen soft flanks nontender bottom of her feet are peeling I see no sign of any type of necrotizing skin condition       Assessment & Plan:   possible tinea on the feet ketoconazole cream twice daily over the next 2 weeks if ongoing troubles follow-up   possible UTI -urine culture-antibiotics for the next 7 days -if high fever severe pain or worse follow-up immediately   recent lab work shows hyperglycemia patient concerned about the possibility of diabetes lab work ordered healthy diet recommended   hemoglobin is low we will screen for aren't deficient anemia

## 2016-08-31 LAB — URINE CULTURE

## 2016-09-03 DIAGNOSIS — E119 Type 2 diabetes mellitus without complications: Secondary | ICD-10-CM | POA: Diagnosis not present

## 2016-09-03 DIAGNOSIS — D509 Iron deficiency anemia, unspecified: Secondary | ICD-10-CM | POA: Diagnosis not present

## 2016-09-04 LAB — CBC WITH DIFFERENTIAL/PLATELET
BASOS: 0 %
Basophils Absolute: 0 10*3/uL (ref 0.0–0.2)
EOS (ABSOLUTE): 0.2 10*3/uL (ref 0.0–0.4)
EOS: 2 %
HEMATOCRIT: 34.4 % (ref 34.0–46.6)
HEMOGLOBIN: 11.2 g/dL (ref 11.1–15.9)
IMMATURE GRANS (ABS): 0 10*3/uL (ref 0.0–0.1)
IMMATURE GRANULOCYTES: 0 %
LYMPHS: 21 %
Lymphocytes Absolute: 1.9 10*3/uL (ref 0.7–3.1)
MCH: 29.6 pg (ref 26.6–33.0)
MCHC: 32.6 g/dL (ref 31.5–35.7)
MCV: 91 fL (ref 79–97)
MONOCYTES: 4 %
Monocytes Absolute: 0.4 10*3/uL (ref 0.1–0.9)
NEUTROS ABS: 6.2 10*3/uL (ref 1.4–7.0)
NEUTROS PCT: 73 %
PLATELETS: 350 10*3/uL (ref 150–379)
RBC: 3.78 x10E6/uL (ref 3.77–5.28)
RDW: 17.7 % — ABNORMAL HIGH (ref 12.3–15.4)
WBC: 8.7 10*3/uL (ref 3.4–10.8)

## 2016-09-04 LAB — IRON AND TIBC
IRON SATURATION: 27 % (ref 15–55)
IRON: 62 ug/dL (ref 27–139)
Total Iron Binding Capacity: 230 ug/dL — ABNORMAL LOW (ref 250–450)
UIBC: 168 ug/dL (ref 118–369)

## 2016-09-04 LAB — HEMOGLOBIN A1C
ESTIMATED AVERAGE GLUCOSE: 123 mg/dL
Hgb A1c MFr Bld: 5.9 % — ABNORMAL HIGH (ref 4.8–5.6)

## 2016-09-04 LAB — FERRITIN: FERRITIN: 81 ng/mL (ref 15–150)

## 2016-09-04 LAB — GLUCOSE, RANDOM: Glucose: 102 mg/dL — ABNORMAL HIGH (ref 65–99)

## 2016-09-06 ENCOUNTER — Encounter: Payer: Self-pay | Admitting: Family Medicine

## 2016-09-06 ENCOUNTER — Ambulatory Visit (INDEPENDENT_AMBULATORY_CARE_PROVIDER_SITE_OTHER): Payer: Medicare Other | Admitting: Family Medicine

## 2016-09-06 VITALS — BP 124/78 | Ht 62.0 in | Wt 146.0 lb

## 2016-09-06 DIAGNOSIS — E784 Other hyperlipidemia: Secondary | ICD-10-CM

## 2016-09-06 DIAGNOSIS — I10 Essential (primary) hypertension: Secondary | ICD-10-CM

## 2016-09-06 DIAGNOSIS — E1121 Type 2 diabetes mellitus with diabetic nephropathy: Secondary | ICD-10-CM

## 2016-09-06 DIAGNOSIS — G8929 Other chronic pain: Secondary | ICD-10-CM | POA: Diagnosis not present

## 2016-09-06 DIAGNOSIS — M1990 Unspecified osteoarthritis, unspecified site: Secondary | ICD-10-CM

## 2016-09-06 DIAGNOSIS — E7849 Other hyperlipidemia: Secondary | ICD-10-CM

## 2016-09-06 DIAGNOSIS — M5440 Lumbago with sciatica, unspecified side: Secondary | ICD-10-CM | POA: Diagnosis not present

## 2016-09-06 DIAGNOSIS — I2510 Atherosclerotic heart disease of native coronary artery without angina pectoris: Secondary | ICD-10-CM

## 2016-09-06 DIAGNOSIS — Z23 Encounter for immunization: Secondary | ICD-10-CM

## 2016-09-06 MED ORDER — ALPRAZOLAM 0.5 MG PO TABS: ORAL_TABLET | ORAL | 5 refills | Status: DC

## 2016-09-06 MED ORDER — ATORVASTATIN CALCIUM 40 MG PO TABS: ORAL_TABLET | ORAL | 5 refills | Status: DC

## 2016-09-06 MED ORDER — LISINOPRIL 2.5 MG PO TABS: ORAL_TABLET | ORAL | 5 refills | Status: DC

## 2016-09-06 MED ORDER — HYDROCODONE-ACETAMINOPHEN 5-325 MG PO TABS: 1.0000 | ORAL_TABLET | Freq: Four times a day (QID) | ORAL | 0 refills | Status: DC | PRN

## 2016-09-06 MED ORDER — METFORMIN HCL 500 MG PO TABS: ORAL_TABLET | ORAL | 5 refills | Status: DC

## 2016-09-06 MED ORDER — RANITIDINE HCL 300 MG PO TABS
300.0000 mg | ORAL_TABLET | Freq: Every day | ORAL | 5 refills | Status: DC
Start: 2016-09-06 — End: 2016-12-27

## 2016-09-06 NOTE — Progress Notes (Signed)
   Subjective:    Patient ID: Elizabeth Aguilar, female    DOB: Mar 27, 1941, 75 y.o.   MRN: [id number]  Diabetes  She presents for her follow-up diabetic visit. She has type 2 diabetes mellitus. No MedicAlert identification noted. Pertinent negatives for hypoglycemia include no confusion. Pertinent negatives for diabetes include no chest pain, no fatigue, no polydipsia, no polyphagia and no weakness. She does not see a podiatrist.Eye exam is current.   Patient does not check her blood pressure outside the office she does try to watch how she eats and avoids excessive sugars and salts Patient has chronic back pain and arthritis pain uses her pain medicine responsibly denies abusing it.  Patient in today for diabetes check up and pain management. Had recent labs drawn on 09/03/16.  States no other concerns this visit.   Review of Systems  Constitutional: Negative for activity change, appetite change and fatigue.  HENT: Negative for congestion.   Respiratory: Negative for cough.   Cardiovascular: Negative for chest pain.  Gastrointestinal: Negative for abdominal pain.  Endocrine: Negative for polydipsia and polyphagia.  Musculoskeletal: Positive for arthralgias and back pain.  Neurological: Negative for weakness.  Psychiatric/Behavioral: Negative for confusion.       Objective:   Physical Exam  Constitutional: She appears well-nourished. No distress.  Cardiovascular: Normal rate, regular rhythm and normal heart sounds.   No murmur heard. Pulmonary/Chest: Effort normal and breath sounds normal. No respiratory distress.  Musculoskeletal: She exhibits no edema.  Lymphadenopathy:    She has no cervical adenopathy.  Neurological: She is alert. She exhibits normal muscle tone.  Psychiatric: Her behavior is normal.  Vitals reviewed.         Assessment & Plan:  HTN good control continue current measures watch diet closely  Diabetes under good control watch diet stay active  Arthritis  followed by specialist  Hyperlipidemia continue current medications previous labs reviewed watch diet  Xanax when necessary to help with sleep caution drowsiness  Chronic back pain and arthritic pain pain management medicines does a good job keeping pain under moderate control continue current medicines 3 prescriptions given  Follow-up 3 months

## 2016-09-10 DIAGNOSIS — H25813 Combined forms of age-related cataract, bilateral: Secondary | ICD-10-CM | POA: Diagnosis not present

## 2016-09-10 DIAGNOSIS — E119 Type 2 diabetes mellitus without complications: Secondary | ICD-10-CM | POA: Diagnosis not present

## 2016-09-10 DIAGNOSIS — Z7984 Long term (current) use of oral hypoglycemic drugs: Secondary | ICD-10-CM | POA: Diagnosis not present

## 2016-09-10 DIAGNOSIS — H04123 Dry eye syndrome of bilateral lacrimal glands: Secondary | ICD-10-CM | POA: Diagnosis not present

## 2016-09-10 LAB — HM DIABETES EYE EXAM

## 2016-09-17 ENCOUNTER — Other Ambulatory Visit: Payer: Self-pay | Admitting: Family Medicine

## 2016-09-19 ENCOUNTER — Encounter: Payer: Self-pay | Admitting: *Deleted

## 2016-10-11 ENCOUNTER — Ambulatory Visit (INDEPENDENT_AMBULATORY_CARE_PROVIDER_SITE_OTHER): Payer: Medicare Other | Admitting: Family Medicine

## 2016-10-11 ENCOUNTER — Encounter: Payer: Self-pay | Admitting: Family Medicine

## 2016-10-11 VITALS — BP 130/80 | Temp 97.9°F | Ht 62.0 in | Wt 145.1 lb

## 2016-10-11 DIAGNOSIS — I2510 Atherosclerotic heart disease of native coronary artery without angina pectoris: Secondary | ICD-10-CM

## 2016-10-11 DIAGNOSIS — N3000 Acute cystitis without hematuria: Secondary | ICD-10-CM | POA: Diagnosis not present

## 2016-10-11 DIAGNOSIS — R300 Dysuria: Secondary | ICD-10-CM

## 2016-10-11 LAB — POCT URINALYSIS DIPSTICK
PH UA: 6
Spec Grav, UA: <=1.005

## 2016-10-11 MED ORDER — NITROFURANTOIN MONOHYD MACRO 100 MG PO CAPS: 100.0000 mg | ORAL_CAPSULE | Freq: Two times a day (BID) | ORAL | 0 refills | Status: DC

## 2016-10-11 NOTE — Progress Notes (Signed)
   Subjective:    Patient ID: Elizabeth Aguilar, female    DOB: February 12, 1941, 75 y.o.   MRN: [id number]  Urinary Tract Infection   This is a new problem. The current episode started in the past 7 days. The problem occurs intermittently. The problem has been unchanged. The quality of the pain is described as burning. The pain is moderate. There has been no fever. She has tried nothing for the symptoms. The treatment provided no relief.    Wed started   Urinating more frequently, History of frequent UTIs who is years   Pressure  low abdomen. No fever no chills no nausea Patient has no other concerns at this time.     Review of Systems No headache, no major weight loss or weight gain, no chest pain no back pain abdominal pain no change in bowel habits complete ROS otherwise negative     Objective:   Physical Exam  Alert vital stable no acute distress lungs clear heart rare rhythm no CVA tenderness low abdomen positive tender  Urinalysis 2-4 white blood cells high-power field      Assessment & Plan:  Impression urinary tract infection plan Macrobid 100 twice a day 5 days symptom care discussed warning signs discussed had Azo-Standard when necessary

## 2016-10-11 NOTE — Patient Instructions (Signed)
azostandard one tab three times per day with meals, urine will turn funny orange color

## 2016-10-17 ENCOUNTER — Other Ambulatory Visit: Payer: Self-pay | Admitting: Family Medicine

## 2016-11-15 ENCOUNTER — Other Ambulatory Visit: Payer: Self-pay | Admitting: Family Medicine

## 2016-11-15 ENCOUNTER — Other Ambulatory Visit: Payer: Self-pay | Admitting: Cardiovascular Disease

## 2016-11-19 NOTE — Telephone Encounter (Signed)
Zofran may have 6 refills the eyedrop refuses it. Her medicine allergy states that she cannot take anti-inflammatory

## 2016-12-06 ENCOUNTER — Other Ambulatory Visit: Payer: Self-pay | Admitting: *Deleted

## 2016-12-06 ENCOUNTER — Ambulatory Visit: Payer: Medicare Other | Admitting: Family Medicine

## 2016-12-06 ENCOUNTER — Telehealth: Payer: Self-pay | Admitting: Family Medicine

## 2016-12-06 MED ORDER — HYDROCODONE-ACETAMINOPHEN 5-325 MG PO TABS: 1.0000 | ORAL_TABLET | Freq: Four times a day (QID) | ORAL | 0 refills | Status: DC | PRN

## 2016-12-06 NOTE — Telephone Encounter (Signed)
Pt resched for three weeks. Please call pt when prescription is ready for pickup today.

## 2016-12-06 NOTE — Telephone Encounter (Signed)
Office visit rescheduled due dr scott having family emergency. One refill of hydocodone given per dr Nicki Reaper. Pt states she does not need any other refills at this time. Pt notified she can pick up script today.

## 2016-12-12 ENCOUNTER — Other Ambulatory Visit: Payer: Self-pay | Admitting: Family Medicine

## 2016-12-27 ENCOUNTER — Encounter: Payer: Self-pay | Admitting: Family Medicine

## 2016-12-27 ENCOUNTER — Ambulatory Visit (INDEPENDENT_AMBULATORY_CARE_PROVIDER_SITE_OTHER): Payer: Medicare Other | Admitting: Family Medicine

## 2016-12-27 VITALS — BP 128/82 | Ht 62.0 in | Wt 144.4 lb

## 2016-12-27 DIAGNOSIS — E1121 Type 2 diabetes mellitus with diabetic nephropathy: Secondary | ICD-10-CM

## 2016-12-27 DIAGNOSIS — M8580 Other specified disorders of bone density and structure, unspecified site: Secondary | ICD-10-CM

## 2016-12-27 DIAGNOSIS — E7849 Other hyperlipidemia: Secondary | ICD-10-CM

## 2016-12-27 DIAGNOSIS — Z79891 Long term (current) use of opiate analgesic: Secondary | ICD-10-CM | POA: Diagnosis not present

## 2016-12-27 DIAGNOSIS — E784 Other hyperlipidemia: Secondary | ICD-10-CM

## 2016-12-27 DIAGNOSIS — I10 Essential (primary) hypertension: Secondary | ICD-10-CM

## 2016-12-27 DIAGNOSIS — K219 Gastro-esophageal reflux disease without esophagitis: Secondary | ICD-10-CM | POA: Diagnosis not present

## 2016-12-27 MED ORDER — HYDROCODONE-ACETAMINOPHEN 5-325 MG PO TABS: 1.0000 | ORAL_TABLET | Freq: Four times a day (QID) | ORAL | 0 refills | Status: DC | PRN

## 2016-12-27 MED ORDER — LISINOPRIL 2.5 MG PO TABS: ORAL_TABLET | ORAL | 5 refills | Status: DC

## 2016-12-27 MED ORDER — ALPRAZOLAM 0.5 MG PO TABS: ORAL_TABLET | ORAL | 5 refills | Status: DC

## 2016-12-27 MED ORDER — ESOMEPRAZOLE MAGNESIUM 40 MG PO CPDR: DELAYED_RELEASE_CAPSULE | ORAL | 5 refills | Status: DC

## 2016-12-27 MED ORDER — ATORVASTATIN CALCIUM 40 MG PO TABS: ORAL_TABLET | ORAL | 5 refills | Status: DC

## 2016-12-27 NOTE — Progress Notes (Signed)
   Subjective:    Patient ID: Elizabeth Aguilar, female    DOB: [birth year], 76 y.o.   MRN: [id number]  HPI This patient was seen today for chronic pain  The medication list was reviewed and updated.   -Compliance with medication: yes  - Number patient states they take daily:  3-4  -when was the last dose patient took? today  The patient was advised the importance of maintaining medication and not using illegal substances with these.  Refills needed: yes  The patient was educated that we can provide 3 monthly scripts for their medication, it is their responsibility to follow the instructions.  Side effects or complications from medications: none  Patient is aware that pain medications are meant to minimize the severity of the pain to allow their pain levels to improve to allow for better function. They are aware of that pain medications cannot totally remove their pain.  Due for UDT ( at least once per year) : 02/2016 Patient does take her Xanax as directed denies drowsiness with it has a lot of stress and anxiety Takes her cholesterol medicine previous labs reviewed Labs or not needed currently watch diet recommended Takes reflux medicine keeps it under good control Does take her blood pressure medicine denies low blood pressure Takes her diabetes medicine denies any problems with Patient is due for bone density       Review of Systems  Constitutional: Negative for activity change and appetite change.  Gastrointestinal: Negative for abdominal pain and vomiting.  Neurological: Negative for weakness.  Psychiatric/Behavioral: Negative for confusion.       Objective:   Physical Exam  Constitutional: She appears well-nourished. No distress.  HENT:  Head: Normocephalic.  Cardiovascular: Normal rate, regular rhythm and normal heart sounds.   No murmur heard. Pulmonary/Chest: Effort normal and breath sounds normal.  Musculoskeletal: She exhibits no edema.  Lymphadenopathy:    She has no cervical adenopathy.  Neurological: She is alert.  Psychiatric: Her behavior is normal.  Vitals reviewed.   Patient uses Xanax no more than 2 per day states it does not cause drowsiness has significant amount of anxiety within her family      Assessment & Plan:  The patient was seen today as part of a comprehensive visit regarding pain control. Patient's compliance with the medication as well as discussion regarding effectiveness was completed. Prescriptions were written. Patient was advised to follow-up in 3 months. The patient was assessed for any signs of severe side effects. The patient was advised to take the medicine as directed and to report to [place] if any side effect issues.  Blood pressure under good control Diabetes reportedly under good control Hyperlipidemia stable previous labs reviewed Comprehensive lab work on next visit We will set patient up for bone density nonemergent

## 2016-12-30 ENCOUNTER — Other Ambulatory Visit: Payer: Self-pay | Admitting: *Deleted

## 2016-12-30 ENCOUNTER — Telehealth: Payer: Self-pay | Admitting: *Deleted

## 2016-12-30 DIAGNOSIS — Z780 Asymptomatic menopausal state: Secondary | ICD-10-CM

## 2016-12-30 DIAGNOSIS — M8580 Other specified disorders of bone density and structure, unspecified site: Secondary | ICD-10-CM

## 2016-12-30 NOTE — Telephone Encounter (Signed)
Dr. Nicki Reaper wants to order bone density test. Test scheduled at [facility]. Pt notified of appt.

## 2017-01-06 ENCOUNTER — Ambulatory Visit (HOSPITAL_COMMUNITY)
Admission: RE | Admit: 2017-01-06 | Discharge: 2017-01-06 | Disposition: A | Discharge status: Home / Self Care | Payer: Medicare Other | Source: Ambulatory Visit | Attending: Family Medicine | Admitting: Family Medicine

## 2017-01-06 DIAGNOSIS — M8580 Other specified disorders of bone density and structure, unspecified site: Secondary | ICD-10-CM | POA: Diagnosis not present

## 2017-01-06 DIAGNOSIS — M85832 Other specified disorders of bone density and structure, left forearm: Secondary | ICD-10-CM | POA: Insufficient documentation

## 2017-01-06 DIAGNOSIS — M810 Age-related osteoporosis without current pathological fracture: Secondary | ICD-10-CM | POA: Diagnosis not present

## 2017-01-06 DIAGNOSIS — Z780 Asymptomatic menopausal state: Secondary | ICD-10-CM | POA: Diagnosis not present

## 2017-01-08 ENCOUNTER — Other Ambulatory Visit (HOSPITAL_COMMUNITY): Payer: Medicare Other

## 2017-01-09 ENCOUNTER — Other Ambulatory Visit (INDEPENDENT_AMBULATORY_CARE_PROVIDER_SITE_OTHER): Payer: Self-pay | Admitting: Orthopedic Surgery

## 2017-01-13 NOTE — Telephone Encounter (Signed)
Rx request 

## 2017-01-13 NOTE — Telephone Encounter (Signed)
Not my patient I have never seen her

## 2017-01-14 ENCOUNTER — Other Ambulatory Visit: Payer: Self-pay | Admitting: Family Medicine

## 2017-01-14 NOTE — Telephone Encounter (Signed)
Have I seen her before?

## 2017-01-30 ENCOUNTER — Other Ambulatory Visit: Payer: Self-pay | Admitting: *Deleted

## 2017-01-30 DIAGNOSIS — E559 Vitamin D deficiency, unspecified: Secondary | ICD-10-CM

## 2017-01-30 DIAGNOSIS — E119 Type 2 diabetes mellitus without complications: Secondary | ICD-10-CM

## 2017-01-30 DIAGNOSIS — E785 Hyperlipidemia, unspecified: Secondary | ICD-10-CM

## 2017-01-30 DIAGNOSIS — I10 Essential (primary) hypertension: Secondary | ICD-10-CM

## 2017-02-05 ENCOUNTER — Ambulatory Visit (INDEPENDENT_AMBULATORY_CARE_PROVIDER_SITE_OTHER): Payer: Medicare Other | Admitting: Family Medicine

## 2017-02-05 ENCOUNTER — Other Ambulatory Visit: Payer: Self-pay | Admitting: Cardiovascular Disease

## 2017-02-05 ENCOUNTER — Encounter: Payer: Self-pay | Admitting: Family Medicine

## 2017-02-05 VITALS — BP 124/82 | Temp 98.7°F | Ht 62.0 in | Wt 143.0 lb

## 2017-02-05 DIAGNOSIS — J111 Influenza due to unidentified influenza virus with other respiratory manifestations: Secondary | ICD-10-CM | POA: Diagnosis not present

## 2017-02-05 MED ORDER — OSELTAMIVIR PHOSPHATE 75 MG PO CAPS: 75.0000 mg | ORAL_CAPSULE | Freq: Two times a day (BID) | ORAL | 0 refills | Status: DC

## 2017-02-05 MED ORDER — ALBUTEROL SULFATE HFA 108 (90 BASE) MCG/ACT IN AERS: 2.0000 | INHALATION_SPRAY | Freq: Four times a day (QID) | RESPIRATORY_TRACT | 2 refills | Status: DC | PRN

## 2017-02-05 NOTE — Progress Notes (Signed)
   Subjective:    Patient ID: Elizabeth Aguilar, female    DOB: 09-30-1941, 76 y.o.   MRN: [id number]  Sinusitis  This is a new problem. The current episode started yesterday. Associated symptoms include congestion, coughing and headaches. (Fever, wheezing) Past treatments include acetaminophen.   Feeling cills and achey  Bad headache   Bad cough   Sudden onset lawt night , dim energy   achey bad in legs and hip , feeling rough  tempes and behind eyes, sharp achey    Review of Systems  HENT: Positive for congestion.   Respiratory: Positive for cough.   Neurological: Positive for headaches.       Objective:   Physical Exam Alert vitals reviewed, moderate malaise. Hydration good. Positive nasal congestion lungs no crackles or wheezes, no tachypnea, intermittent bronchial cough during exam heart regular rate and rhythm.        Assessment & Plan:  Impression influenza discussed at length. Petra Kuba of illness and potential sequela discussed. Plan Tamiflu prescribed if indicated and timing appropriate. Symptom care discussed. Warning signs discussed. WSL

## 2017-02-10 ENCOUNTER — Telehealth: Payer: Self-pay | Admitting: Family Medicine

## 2017-02-10 ENCOUNTER — Encounter: Payer: Self-pay | Admitting: Family Medicine

## 2017-02-10 ENCOUNTER — Ambulatory Visit (INDEPENDENT_AMBULATORY_CARE_PROVIDER_SITE_OTHER): Payer: Medicare Other | Admitting: Family Medicine

## 2017-02-10 VITALS — BP 132/74 | Temp 98.6°F | Ht 62.0 in | Wt 142.0 lb

## 2017-02-10 DIAGNOSIS — J019 Acute sinusitis, unspecified: Secondary | ICD-10-CM | POA: Diagnosis not present

## 2017-02-10 MED ORDER — AMOXICILLIN-POT CLAVULANATE 875-125 MG PO TABS: 1.0000 | ORAL_TABLET | Freq: Two times a day (BID) | ORAL | 0 refills | Status: DC

## 2017-02-10 MED ORDER — CEFPROZIL 500 MG PO TABS: 500.0000 mg | ORAL_TABLET | Freq: Two times a day (BID) | ORAL | 0 refills | Status: DC

## 2017-02-10 NOTE — Telephone Encounter (Signed)
Spoke with patient and informed her per Dr.Scott Luking- We are sending in Augmentin. Also recommend office visit today. Patient verbalized understanding and was sent to the front desk to schedule appointment.

## 2017-02-10 NOTE — Telephone Encounter (Signed)
Nurse's-please talk with the patient is possible that she has a secondary sinus infection find out for the patient any fevers? Does she feel shortness of breath or his more of head congestion and drainage and coughing? We may need to call in a medication for her

## 2017-02-10 NOTE — Telephone Encounter (Signed)
Start augmentin 875 one 2 times a day for 10 days ( not truly allergic to PCN) I do rec that she be seen toadya or if cant come today then in the am

## 2017-02-10 NOTE — Addendum Note (Signed)
Addended by: Sallee Lange A on: 02/10/2017 04:25 PM   Modules accepted: Orders

## 2017-02-10 NOTE — Progress Notes (Addendum)
   Subjective:    Patient ID: Elizabeth Aguilar, female    DOB: 03/06/1941, 76 y.o.   MRN: [id number]  Sinusitis  This is a new problem. Episode onset: 6 days. Associated symptoms include congestion, coughing and sinus pressure. Treatments tried: tamiflu.   Patient treated last week with the flu Now having significant had chest congestion drainage symptoms intermittent fever still denies wheezing difficulty breathing   Review of Systems  HENT: Positive for congestion and sinus pressure.   Respiratory: Positive for cough.       denies wheezing difficulty breathing. Objective:   Physical Exam  Eardrums normal throat is normal neck no masses lungs are clear cough is noted heart regular no crackles no pneumonia      Assessment & Plan:  Influenza resolving Viral bronchitis Acute rhinosinusitis New antibiotics sent in Patient with numerous drug intolerances-we try to choose a medication that she would tolerate if she has any trouble she will let us know

## 2017-02-10 NOTE — Telephone Encounter (Signed)
Spoke with the patient and she stated that she is having a deep cough, runny nose, has had some fevers of  102, and states she does have SOB of breath but it has been going on for awhile. Please advise?

## 2017-02-10 NOTE — Telephone Encounter (Signed)
Patient was seen on 3/28 with the flu and given tamiflu. She states taking her last pill today but still has bad cough,headache runny nose. She is wanting something else called into [facility]

## 2017-02-11 ENCOUNTER — Telehealth: Payer: Self-pay | Admitting: Family Medicine

## 2017-02-11 MED ORDER — CEFDINIR 300 MG PO CAPS: 300.0000 mg | ORAL_CAPSULE | Freq: Two times a day (BID) | ORAL | 0 refills | Status: DC

## 2017-02-11 NOTE — Telephone Encounter (Signed)
Patient had Rx for cefprozil called in yesterday to [facility] in [place]. She says the pharmacy called our office yesterday because her insurance is not wanting to pay for this medication, but we never returned their call.  She said she really needs this medication and would like for someone to call back the pharmacy.

## 2017-02-11 NOTE — Telephone Encounter (Signed)
Spoke with patient and informed her per Dr.Scott Luking- we are sending [place]. Informed patient that I called the pharmacy and they said that medication was covered by insurance. Patient verbalized understanding.

## 2017-02-11 NOTE — Telephone Encounter (Signed)
Please find out from the pharmacy if they cover Elizabeth Aguilar if so use this 300 mg twice a day-the patient states she cannot take Ceftin because of side effects.

## 2017-02-17 DIAGNOSIS — D692 Other nonthrombocytopenic purpura: Secondary | ICD-10-CM | POA: Diagnosis not present

## 2017-02-17 DIAGNOSIS — L859 Epidermal thickening, unspecified: Secondary | ICD-10-CM | POA: Diagnosis not present

## 2017-02-17 DIAGNOSIS — L570 Actinic keratosis: Secondary | ICD-10-CM | POA: Diagnosis not present

## 2017-02-17 DIAGNOSIS — Z85828 Personal history of other malignant neoplasm of skin: Secondary | ICD-10-CM | POA: Diagnosis not present

## 2017-02-17 DIAGNOSIS — D485 Neoplasm of uncertain behavior of skin: Secondary | ICD-10-CM | POA: Diagnosis not present

## 2017-02-20 DIAGNOSIS — M170 Bilateral primary osteoarthritis of knee: Secondary | ICD-10-CM | POA: Insufficient documentation

## 2017-02-20 DIAGNOSIS — M19042 Primary osteoarthritis, left hand: Secondary | ICD-10-CM

## 2017-02-20 DIAGNOSIS — Z8639 Personal history of other endocrine, nutritional and metabolic disease: Secondary | ICD-10-CM | POA: Insufficient documentation

## 2017-02-20 DIAGNOSIS — M19041 Primary osteoarthritis, right hand: Secondary | ICD-10-CM | POA: Insufficient documentation

## 2017-02-20 DIAGNOSIS — Z8679 Personal history of other diseases of the circulatory system: Secondary | ICD-10-CM | POA: Insufficient documentation

## 2017-02-20 DIAGNOSIS — M19071 Primary osteoarthritis, right ankle and foot: Secondary | ICD-10-CM | POA: Insufficient documentation

## 2017-02-20 DIAGNOSIS — M160 Bilateral primary osteoarthritis of hip: Secondary | ICD-10-CM | POA: Insufficient documentation

## 2017-02-20 DIAGNOSIS — M5136 Other intervertebral disc degeneration, lumbar region: Secondary | ICD-10-CM | POA: Insufficient documentation

## 2017-02-20 DIAGNOSIS — Z87898 Personal history of other specified conditions: Secondary | ICD-10-CM | POA: Insufficient documentation

## 2017-02-20 DIAGNOSIS — M19072 Primary osteoarthritis, left ankle and foot: Secondary | ICD-10-CM

## 2017-02-20 DIAGNOSIS — M533 Sacrococcygeal disorders, not elsewhere classified: Secondary | ICD-10-CM | POA: Insufficient documentation

## 2017-02-20 DIAGNOSIS — M109 Gout, unspecified: Secondary | ICD-10-CM | POA: Insufficient documentation

## 2017-02-20 NOTE — Progress Notes (Signed)
Office Visit Note  Patient: Elizabeth Aguilar             Date of Birth: 02/05/1941           MRN: [id number]             PCP: Sallee Lange, MD Referring: Kathyrn Drown, MD Visit Date: 02/25/2017 Occupation: @GUAROCC @    Subjective:  Follow-up   History of Present Illness: Elizabeth Aguilar is a 76 y.o. female    Had flu 2 - 3 weeks weeks ago. Dr. Wolfgang Phoenix dx'd patient. tx'd w/ tamiflu. Used hydrocodone for pain relief due to "severe pain" during flu. "every joint in my body ached".  Overall, patient is doing very well with her gout. She's taking her allopurinol as prescribed at 1-1/2 tablet daily She states "I don't have any gout". She's been taking her medication as prescribed. She's been also watching her diet and drinking proper amount of fluids.  Patient also has pain to her hips, knees, hands, shoulders. Her pain is consistent with osteoarthritis.  I offered her injections in her knees but patient declined.  Patient has a elderly neighbor who does not have transportation. Patient has offered to drive him to his doctor's appointment at 5:00 and is anxious to leave our office as quickly as possible to accommodate his medical needs.  Activities of Daily Living:  Patient reports morning stiffness for 30 minutes.   Patient Reports nocturnal pain.  Difficulty dressing/grooming: Denies Difficulty climbing stairs: Reports Difficulty getting out of chair: Reports Difficulty using hands for taps, buttons, cutlery, and/or writing: Reports   Review of Systems  Constitutional: Negative for fatigue.  HENT: Negative for mouth sores and mouth dryness.   Eyes: Negative for dryness.  Respiratory: Negative for shortness of breath.   Gastrointestinal: Negative for constipation and diarrhea.  Musculoskeletal: Negative for myalgias and myalgias.  Skin: Negative for sensitivity to sunlight.  Psychiatric/Behavioral: Negative for decreased concentration and sleep disturbance.    PMFS  History:  Patient Active Problem List   Diagnosis Date Noted  . 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 Primary osteoarthritis of both knees 02/20/2017  . Pain of left sacroiliac joint 02/20/2017  . Primary osteoarthritis of both hips 02/20/2017  . Primary osteoarthritis of both hands 02/20/2017  . Primary osteoarthritis of both feet 02/20/2017  . DDD (degenerative disc disease), lumbar 02/20/2017  . History of hypertension 02/20/2017  . History of coronary artery disease 02/20/2017  . History of pericarditis 02/20/2017  . History of CHF (congestive heart failure) 02/20/2017  . History of hypercholesterolemia 02/20/2017  . History of diabetes mellitus 02/20/2017  . History of high cholesterol 02/20/2017  . History of tachycardia 02/20/2017  . CAD in native artery 12/05/2015  . Arthritis 11/22/2015  . Pain in the chest   . Chest pain 08/06/2015  . Back pain 08/06/2015  . GERD (gastroesophageal reflux disease) 05/26/2015  . Chronic diastolic CHF (congestive heart failure) ([place])   . Pericarditis 10/10/2014  . Osteopenia 06/03/2014  . Sinus tachycardia 01/18/2014  . 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 12/17/2013    Class: Diagnosis of  . Ischemic cardiomyopathy 12/17/2013    Class: Diagnosis of  . STEMI 12/16/13- LAD DES 12/16/2013  . Chronic back pain 08/12/2013  . Hyperlipemia 04/19/2013  . Essential hypertension, benign 04/19/2013  . Diabetes type 2, controlled ([place]) 04/19/2013  . Chronic arthralgias of knees and hips 04/19/2013    Past Medical History:  Diagnosis Date  . CAD (coronary artery disease)  a.  s/p STEMI s/p DES to LAD and RCA (12/2013)  . Chest pain 10/10/2014  . Chronic diastolic CHF (congestive heart failure) ([place])   . Hyperlipidemia   . Irritable bowel syndrome   . Myocardial infarction ([place])   . Pericarditis   . Type 2 diabetes mellitus ([facility])     Family History  Problem Relation Age of Onset  . Diabetes Mother   . Diabetes Daughter   . Diabetes Son    Past  Surgical History:  Procedure Laterality Date  . CORONARY ANGIOPLASTY WITH STENT PLACEMENT    . LEFT HEART CATHETERIZATION WITH CORONARY ANGIOGRAM N/A 12/16/2013   Procedure: LEFT HEART CATHETERIZATION WITH CORONARY ANGIOGRAM;  Surgeon: Troy Sine, MD;  Location: [facility] CATH LAB;  Service: Cardiovascular;  Laterality: N/A;  . LEFT HEART CATHETERIZATION WITH CORONARY ANGIOGRAM  12/17/2013   Procedure: LEFT HEART CATHETERIZATION WITH CORONARY ANGIOGRAM;  Surgeon: Troy Sine, MD;  Location: [facility] CATH LAB;  Service: Cardiovascular;;  . PERCUTANEOUS CORONARY STENT INTERVENTION (PCI-S) N/A 12/17/2013   Procedure: PERCUTANEOUS CORONARY STENT INTERVENTION (PCI-S);  Surgeon: Troy Sine, MD;  Location: [facility] CATH LAB;  Service: Cardiovascular;  Laterality: N/A;   Social History   Social History Narrative  . No narrative on file     Objective: Vital Signs: BP 138/77   Pulse 96   Resp 16   Ht 5\' 5"  (1.651 m)   Wt 145 lb (65.8 kg)   BMI 24.13 kg/m    Physical Exam  Constitutional: She is oriented to person, place, and time. She appears well-developed and well-nourished.  HENT:  Head: Normocephalic and atraumatic.  Eyes: EOM are normal. Pupils are equal, round, and reactive to light.  Cardiovascular: Normal rate, regular rhythm and normal heart sounds.  Exam reveals no gallop and no friction rub.   No murmur heard. Pulmonary/Chest: Effort normal and breath sounds normal. She has no wheezes. She has no rales.  Abdominal: Soft. Bowel sounds are normal. She exhibits no distension. There is no tenderness. There is no guarding. No hernia.  Musculoskeletal: Normal range of motion. She exhibits no edema, tenderness or deformity.  Lymphadenopathy:    She has no cervical adenopathy.  Neurological: She is alert and oriented to person, place, and time. Coordination normal.  Skin: Skin is warm and dry. Capillary refill takes less than 2 seconds. No rash noted.  Psychiatric: She has a normal mood and  affect. Her behavior is normal.  Nursing note and vitals reviewed.    Musculoskeletal Exam:  Full range of motion of all joints Grip strength is equal and strong bilaterally Fibromyalgia tender points are all absent  CDAI Exam: CDAI Homunculus Exam:   Joint Counts:  CDAI Tender Joint count: 0 CDAI Swollen Joint count: 0  Global Assessments:  Patient Global Assessment: 10 Provider Global Assessment: 10  CDAI Calculated Score: 20    Investigation: Findings:  September 2017:  CBC showed hemoglobin of 11.2.  Comprehensive metabolic panel showed elevated alkaline phosphatase of 155, GFR 57, which is stable, and uric acid was 3.8.  GFR is low but stable-08/2016  Office Visit on 10/11/2016  Component Date Value Ref Range Status  . Spec Grav, UA 10/11/2016 <=1.005   Final  . pH, UA 10/11/2016 6.0   Final  . Leukocytes, UA 10/11/2016 small (1+)* Negative Final  Abstract on 09/19/2016  Component Date Value Ref Range Status  . HM Diabetic Eye Exam 09/10/2016 No Retinopathy  No Retinopathy Final  Office Visit on 08/29/2016  Component Date Value Ref Range Status  . Spec Grav, UA 08/29/2016 <=1.005   Final  . pH, UA 08/29/2016 6.0   Final  . WBC 09/03/2016 8.7  3.4 - 10.8 x10E3/uL Final  . RBC 09/03/2016 3.78  3.77 - 5.28 x10E6/uL Final  . Hemoglobin 09/03/2016 11.2  11.1 - 15.9 g/dL Final  . Hematocrit 09/03/2016 34.4  34.0 - 46.6 % Final  . MCV 09/03/2016 91  79 - 97 fL Final  . MCH 09/03/2016 29.6  26.6 - 33.0 pg Final  . MCHC 09/03/2016 32.6  31.5 - 35.7 g/dL Final  . RDW 09/03/2016 17.7* 12.3 - 15.4 % Final  . Platelets 09/03/2016 350  150 - 379 x10E3/uL Final  . Neutrophils 09/03/2016 73  Not Estab. % Final  . Lymphs 09/03/2016 21  Not Estab. % Final  . Monocytes 09/03/2016 4  Not Estab. % Final  . Eos 09/03/2016 2  Not Estab. % Final  . Basos 09/03/2016 0  Not Estab. % Final  . Neutrophils Absolute 09/03/2016 6.2  1.4 - 7.0 x10E3/uL Final  . Lymphocytes Absolute  09/03/2016 1.9  0.7 - 3.1 x10E3/uL Final  . Monocytes Absolute 09/03/2016 0.4  0.1 - 0.9 x10E3/uL Final  . EOS (ABSOLUTE) 09/03/2016 0.2  0.0 - 0.4 x10E3/uL Final  . Basophils Absolute 09/03/2016 0.0  0.0 - 0.2 x10E3/uL Final  . Immature Granulocytes 09/03/2016 0  Not Estab. % Final  . Immature Grans (Abs) 09/03/2016 0.0  0.0 - 0.1 x10E3/uL Final  . Total Iron Binding Capacity 09/03/2016 230* 250 - 450 ug/dL Final  . UIBC 09/03/2016 168  118 - 369 ug/dL Final  . Iron 09/03/2016 62  27 - 139 ug/dL Final  . Iron Saturation 09/03/2016 27  15 - 55 % Final  . Ferritin 09/03/2016 81  15 - 150 ng/mL Final  . Hgb A1c MFr Bld 09/03/2016 5.9* 4.8 - 5.6 % Final   Comment:          Pre-diabetes: 5.7 - 6.4          Diabetes: >6.4          Glycemic control for adults with diabetes: <7.0   . Est. average glucose Bld gHb Est-m* 09/03/2016 123  mg/dL Final  . Urine Culture, Routine 08/29/2016 Final report   Final  . Urine Culture result 1 08/29/2016 Comment   Final   Comment: Mixed urogenital flora Less than 10,000 colonies/mL   . Glucose 09/03/2016 102* 65 - 99 mg/dL Final      Imaging: No results found.  Speciality Comments: No specialty comments available.    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 No flare. Taking allopurinol 300 mg; one and half tablets daily. Patient states "I don't have any gout". We discussed the importance of taking allopurinol regularly and that her gout is well controlled due to medications and proper  diet and that's why she does not have any flare.  #2: Has not had any flares since I was not taking any colchicine at this time.  #3: Has history of osteoarthritis to knee joint, hip joints, hands, feet bilaterally. Having some stiffness to all of those joints. For her knees, I offered her steroid injection but patient declined. She states that if I need to get those injections I will call you.  #4: CBC with differential, CMP with GFR, uric acid to be done in office today.  #5: Return to clinic in 5 months  Orders: Orders Placed This Encounter  Procedures  . CBC with Differential/Platelet  . COMPLETE METABOLIC PANEL WITH GFR  . Uric acid   Meds ordered this encounter  Medications  . allopurinol (ZYLOPRIM) 300 MG tablet    Sig: Take 1.5 tablets (450 mg total) by mouth daily.    Dispense:  135 tablet    Refill:  1    Order Specific Question:   Supervising Provider    Answer:   Bo Merino [phone]    Face-to-face time spent with patient was 30 minutes. 50% of time was spent in counseling and coordination of care.  Follow-Up Instructions: Return in about 5 months (around 07/28/2017) for gout, allopurinol 450mg  qd, oa kj,hip jt, hands,feet, sj; .   Eliezer Lofts, PA-C  Note - This record has been created using [employer].  Chart creation errors have been sought, but may not always  have been located. Such creation errors do not reflect on  the standard of medical care.

## 2017-02-21 ENCOUNTER — Telehealth: Payer: Self-pay | Admitting: Family Medicine

## 2017-02-21 NOTE — Telephone Encounter (Signed)
Review dermatopathology report from [facility] in results folder. °

## 2017-02-25 ENCOUNTER — Encounter: Payer: Self-pay | Admitting: Rheumatology

## 2017-02-25 ENCOUNTER — Ambulatory Visit (INDEPENDENT_AMBULATORY_CARE_PROVIDER_SITE_OTHER): Payer: Medicare Other | Admitting: Rheumatology

## 2017-02-25 VITALS — BP 138/77 | HR 96 | Resp 16 | Ht 65.0 in | Wt 145.0 lb

## 2017-02-25 DIAGNOSIS — M533 Sacrococcygeal disorders, not elsewhere classified: Secondary | ICD-10-CM

## 2017-02-25 DIAGNOSIS — M19041 Primary osteoarthritis, right hand: Secondary | ICD-10-CM

## 2017-02-25 DIAGNOSIS — M160 Bilateral primary osteoarthritis of hip: Secondary | ICD-10-CM

## 2017-02-25 DIAGNOSIS — M8589 Other specified disorders of bone density and structure, multiple sites: Secondary | ICD-10-CM | POA: Diagnosis not present

## 2017-02-25 DIAGNOSIS — M170 Bilateral primary osteoarthritis of knee: Secondary | ICD-10-CM | POA: Diagnosis not present

## 2017-02-25 DIAGNOSIS — M19072 Primary osteoarthritis, left ankle and foot: Secondary | ICD-10-CM

## 2017-02-25 DIAGNOSIS — M5136 Other intervertebral disc degeneration, lumbar region: Secondary | ICD-10-CM | POA: Diagnosis not present

## 2017-02-25 DIAGNOSIS — M19042 Primary osteoarthritis, left hand: Secondary | ICD-10-CM | POA: Diagnosis not present

## 2017-02-25 DIAGNOSIS — M19071 Primary osteoarthritis, right ankle and foot: Secondary | ICD-10-CM | POA: Diagnosis not present

## 2017-02-25 DIAGNOSIS — M109 Gout, unspecified: Secondary | ICD-10-CM | POA: Diagnosis not present

## 2017-02-25 LAB — CBC WITH DIFFERENTIAL/PLATELET
BASOS PCT: 1 %
Basophils Absolute: 94 cells/uL (ref 0–200)
EOS PCT: 2 %
Eosinophils Absolute: 188 cells/uL (ref 15–500)
HEMATOCRIT: 37.6 % (ref 35.0–45.0)
Hemoglobin: 12.2 g/dL (ref 11.7–15.5)
LYMPHS PCT: 28 %
Lymphs Abs: 2632 cells/uL (ref 850–3900)
MCH: 29.5 pg (ref 27.0–33.0)
MCHC: 32.4 g/dL (ref 32.0–36.0)
MCV: 91 fL (ref 80.0–100.0)
MONOS PCT: 7 %
MPV: 11.2 fL (ref 7.5–12.5)
Monocytes Absolute: 658 cells/uL (ref 200–950)
Neutro Abs: 5828 cells/uL (ref 1500–7800)
Neutrophils Relative %: 62 %
PLATELETS: 341 10*3/uL (ref 140–400)
RBC: 4.13 MIL/uL (ref 3.80–5.10)
RDW: 17.2 % — AB (ref 11.0–15.0)
WBC: 9.4 10*3/uL (ref 3.8–10.8)

## 2017-02-25 LAB — COMPLETE METABOLIC PANEL WITH GFR
ALT: 13 U/L (ref 6–29)
AST: 13 U/L (ref 10–35)
Albumin: 3.7 g/dL (ref 3.6–5.1)
Alkaline Phosphatase: 136 U/L — ABNORMAL HIGH (ref 33–130)
BUN: 17 mg/dL (ref 7–25)
CO2: 24 mmol/L (ref 20–31)
CREATININE: 0.99 mg/dL — AB (ref 0.60–0.93)
Calcium: 9.5 mg/dL (ref 8.6–10.4)
Chloride: 109 mmol/L (ref 98–110)
GFR, Est African American: 64 mL/min (ref >=60)
GFR, Est Non African American: 56 mL/min — ABNORMAL LOW (ref >=60)
GLUCOSE: 91 mg/dL (ref 65–99)
POTASSIUM: 4.4 mmol/L (ref 3.5–5.3)
SODIUM: 142 mmol/L (ref 135–146)
Total Bilirubin: 0.4 mg/dL (ref 0.2–1.2)
Total Protein: 6.3 g/dL (ref 6.1–8.1)

## 2017-02-25 MED ORDER — ALLOPURINOL 300 MG PO TABS: 450.0000 mg | ORAL_TABLET | Freq: Every day | ORAL | 1 refills | Status: DC

## 2017-02-26 LAB — URIC ACID: Uric Acid, Serum: 3.2 mg/dL (ref 2.5–7.0)

## 2017-02-26 NOTE — Telephone Encounter (Signed)
Results noted benign

## 2017-03-06 ENCOUNTER — Other Ambulatory Visit: Payer: Self-pay | Admitting: Family Medicine

## 2017-03-06 DIAGNOSIS — E559 Vitamin D deficiency, unspecified: Secondary | ICD-10-CM | POA: Diagnosis not present

## 2017-03-06 DIAGNOSIS — E119 Type 2 diabetes mellitus without complications: Secondary | ICD-10-CM | POA: Diagnosis not present

## 2017-03-06 DIAGNOSIS — I10 Essential (primary) hypertension: Secondary | ICD-10-CM | POA: Diagnosis not present

## 2017-03-06 DIAGNOSIS — E785 Hyperlipidemia, unspecified: Secondary | ICD-10-CM | POA: Diagnosis not present

## 2017-03-07 ENCOUNTER — Encounter: Payer: Self-pay | Admitting: Family Medicine

## 2017-03-07 LAB — VITAMIN D 25 HYDROXY (VIT D DEFICIENCY, FRACTURES): Vit D, 25-Hydroxy: 28.2 ng/mL — ABNORMAL LOW (ref 30.0–100.0)

## 2017-03-07 LAB — BASIC METABOLIC PANEL
BUN/Creatinine Ratio: 18 (ref 12–28)
BUN: 17 mg/dL (ref 8–27)
CALCIUM: 9.3 mg/dL (ref 8.7–10.3)
CHLORIDE: 104 mmol/L (ref 96–106)
CO2: 25 mmol/L (ref 18–29)
Creatinine, Ser: 0.96 mg/dL (ref 0.57–1.00)
GFR calc Af Amer: 66 mL/min/{1.73_m2} (ref >59)
GFR, EST NON AFRICAN AMERICAN: 58 mL/min/{1.73_m2} — AB (ref >59)
Glucose: 108 mg/dL — ABNORMAL HIGH (ref 65–99)
POTASSIUM: 4.4 mmol/L (ref 3.5–5.2)
Sodium: 144 mmol/L (ref 134–144)

## 2017-03-07 LAB — LIPID PANEL
CHOLESTEROL TOTAL: 153 mg/dL (ref 100–199)
Chol/HDL Ratio: 2.9 ratio (ref 0.0–4.4)
HDL: 52 mg/dL (ref >39)
LDL CALC: 75 mg/dL (ref 0–99)
Triglycerides: 131 mg/dL (ref 0–149)
VLDL Cholesterol Cal: 26 mg/dL (ref 5–40)

## 2017-03-07 LAB — HEMOGLOBIN A1C
ESTIMATED AVERAGE GLUCOSE: 123 mg/dL
HEMOGLOBIN A1C: 5.9 % — AB (ref 4.8–5.6)

## 2017-03-27 ENCOUNTER — Telehealth: Payer: Self-pay | Admitting: Family Medicine

## 2017-03-27 ENCOUNTER — Emergency Department (HOSPITAL_COMMUNITY): Payer: Medicare Other

## 2017-03-27 ENCOUNTER — Ambulatory Visit (INDEPENDENT_AMBULATORY_CARE_PROVIDER_SITE_OTHER): Payer: Medicare Other | Admitting: Family Medicine

## 2017-03-27 ENCOUNTER — Encounter: Payer: Self-pay | Admitting: Family Medicine

## 2017-03-27 ENCOUNTER — Emergency Department (HOSPITAL_COMMUNITY)
Admission: EM | Admit: 2017-03-27 | Discharge: 2017-03-27 | Disposition: A | Discharge status: Home / Self Care | Payer: Medicare Other | Attending: Emergency Medicine | Admitting: Emergency Medicine

## 2017-03-27 ENCOUNTER — Encounter (HOSPITAL_COMMUNITY): Payer: Self-pay | Admitting: Emergency Medicine

## 2017-03-27 VITALS — BP 132/84 | HR 91 | Ht 62.0 in | Wt 142.0 lb

## 2017-03-27 DIAGNOSIS — Z79899 Other long term (current) drug therapy: Secondary | ICD-10-CM | POA: Insufficient documentation

## 2017-03-27 DIAGNOSIS — I2510 Atherosclerotic heart disease of native coronary artery without angina pectoris: Secondary | ICD-10-CM | POA: Insufficient documentation

## 2017-03-27 DIAGNOSIS — I5031 Acute diastolic (congestive) heart failure: Secondary | ICD-10-CM | POA: Diagnosis not present

## 2017-03-27 DIAGNOSIS — I499 Cardiac arrhythmia, unspecified: Secondary | ICD-10-CM | POA: Diagnosis not present

## 2017-03-27 DIAGNOSIS — Z955 Presence of coronary angioplasty implant and graft: Secondary | ICD-10-CM | POA: Diagnosis not present

## 2017-03-27 DIAGNOSIS — E1121 Type 2 diabetes mellitus with diabetic nephropathy: Secondary | ICD-10-CM | POA: Diagnosis not present

## 2017-03-27 DIAGNOSIS — G8929 Other chronic pain: Secondary | ICD-10-CM | POA: Diagnosis not present

## 2017-03-27 DIAGNOSIS — I110 Hypertensive heart disease with heart failure: Secondary | ICD-10-CM | POA: Insufficient documentation

## 2017-03-27 DIAGNOSIS — E119 Type 2 diabetes mellitus without complications: Secondary | ICD-10-CM | POA: Insufficient documentation

## 2017-03-27 DIAGNOSIS — I10 Essential (primary) hypertension: Secondary | ICD-10-CM

## 2017-03-27 DIAGNOSIS — R0602 Shortness of breath: Secondary | ICD-10-CM | POA: Diagnosis not present

## 2017-03-27 DIAGNOSIS — Z7982 Long term (current) use of aspirin: Secondary | ICD-10-CM | POA: Diagnosis not present

## 2017-03-27 DIAGNOSIS — Z7984 Long term (current) use of oral hypoglycemic drugs: Secondary | ICD-10-CM | POA: Insufficient documentation

## 2017-03-27 DIAGNOSIS — I252 Old myocardial infarction: Secondary | ICD-10-CM | POA: Insufficient documentation

## 2017-03-27 DIAGNOSIS — M5440 Lumbago with sciatica, unspecified side: Secondary | ICD-10-CM | POA: Diagnosis not present

## 2017-03-27 DIAGNOSIS — R05 Cough: Secondary | ICD-10-CM | POA: Diagnosis not present

## 2017-03-27 DIAGNOSIS — R0600 Dyspnea, unspecified: Secondary | ICD-10-CM | POA: Diagnosis not present

## 2017-03-27 DIAGNOSIS — I498 Other specified cardiac arrhythmias: Secondary | ICD-10-CM

## 2017-03-27 LAB — CBC
HEMATOCRIT: 38.9 % (ref 36.0–46.0)
Hemoglobin: 12.8 g/dL (ref 12.0–15.0)
MCH: 30.5 pg (ref 26.0–34.0)
MCHC: 32.9 g/dL (ref 30.0–36.0)
MCV: 92.6 fL (ref 78.0–100.0)
PLATELETS: 298 10*3/uL (ref 150–400)
RBC: 4.2 MIL/uL (ref 3.87–5.11)
RDW: 16 % — AB (ref 11.5–15.5)
WBC: 9.6 10*3/uL (ref 4.0–10.5)

## 2017-03-27 LAB — BASIC METABOLIC PANEL
Anion gap: 8 (ref 5–15)
BUN: 20 mg/dL (ref 6–20)
CALCIUM: 9.6 mg/dL (ref 8.9–10.3)
CO2: 27 mmol/L (ref 22–32)
Chloride: 107 mmol/L (ref 101–111)
Creatinine, Ser: 1.07 mg/dL — ABNORMAL HIGH (ref 0.44–1.00)
GFR calc Af Amer: 57 mL/min — ABNORMAL LOW (ref >60)
GFR, EST NON AFRICAN AMERICAN: 49 mL/min — AB (ref >60)
Glucose, Bld: 113 mg/dL — ABNORMAL HIGH (ref 65–99)
POTASSIUM: 3.8 mmol/L (ref 3.5–5.1)
SODIUM: 142 mmol/L (ref 135–145)

## 2017-03-27 LAB — I-STAT TROPONIN, ED: TROPONIN I, POC: 0.01 ng/mL (ref 0.00–0.08)

## 2017-03-27 LAB — BRAIN NATRIURETIC PEPTIDE: B NATRIURETIC PEPTIDE 5: 329 pg/mL — AB (ref 0.0–100.0)

## 2017-03-27 MED ORDER — HYDROCODONE-ACETAMINOPHEN 5-325 MG PO TABS: 1.0000 | ORAL_TABLET | Freq: Four times a day (QID) | ORAL | 0 refills | Status: DC | PRN

## 2017-03-27 MED ORDER — FUROSEMIDE 20 MG PO TABS: 20.0000 mg | ORAL_TABLET | Freq: Every day | ORAL | 0 refills | Status: DC

## 2017-03-27 MED ORDER — FUROSEMIDE 10 MG/ML IJ SOLN
20.0000 mg | Freq: Once | INTRAMUSCULAR | Status: AC
  Administered 2017-03-27: 20 mg via INTRAVENOUS
  Filled 2017-03-27: qty 2

## 2017-03-27 NOTE — Discharge Instructions (Signed)
Your symptoms are suggestive of heart failure flair up.  You're started on a diuretic to help with your symptoms of shortness of breath.  Please call Dr. Lance Sell office tomorrow to set up close follow-up for additional work-up, including ultrasound of the heart.  Please return without fail for worsening symptoms, including difficulty breathing, passing out, chest pain, or any other symptoms concerning to you.

## 2017-03-27 NOTE — Progress Notes (Signed)
Subjective:    Patient ID: Elizabeth Aguilar, female    DOB: August 13, 1941, 76 y.o.   MRN: [id number]  HPI This patient was seen today for chronic pain  The medication list was reviewed and updated.   -Compliance with medication: Takes daily   - Number patient states they take daily: States takes 4 or more days  -when was the last dose patient took? Patient last took pain medications last   The patient was advised the importance of maintaining medication and not using illegal substances with these.  Refills needed: yes   The patient was educated that we can provide 3 monthly scripts for their medication, it is their responsibility to follow the instructions.  Side effects or complications from medications: None  Patient is aware that pain medications are meant to minimize the severity of the pain to allow their pain levels to improve to allow for better function. They are aware of that pain medications cannot totally remove their pain.   Due for UDT ( at least once per year) : Due as of April 28th.  Has concerns of SOB last night. This patient relates that last evening she started feeling short of breath and she noticed throughout the night whenever she would lay down she would get short of breath and has to sit up to breathe she states she has not seen any swelling in her legs she also relates over the past several weeks some shortness of breath with activity denies any chest tightness pressure pain She does have a history of heart disease, diastolic dysfunction, coronary artery disease, hyperlipidemia, hypertension, history of a STEMI in February 2015 She is also followed by Dr. Claiborne Billings cardiology in addition to this patient does have diabetes and chronic pain      Review of Systems  Constitutional: Negative for activity change and appetite change.  Respiratory: Positive for shortness of breath.   Cardiovascular: Positive for palpitations. Negative for chest pain and leg swelling.    Gastrointestinal: Negative for abdominal pain and vomiting.  Neurological: Negative for weakness.  Psychiatric/Behavioral: Negative for confusion.       Objective:   Physical Exam  Constitutional: She appears well-nourished. No distress.  HENT:  Head: Normocephalic.  Cardiovascular: Normal rate and normal heart sounds.   No murmur heard. Pulmonary/Chest: Effort normal.  Musculoskeletal: She exhibits no edema.  Lymphadenopathy:    She has no cervical adenopathy.  Neurological: She is alert.  Psychiatric: Her behavior is normal.  Vitals reviewed. There is some crackles in the bases of the lung Irregularity to the heart but not rapid Extremities no edema Diabetic foot exam has mild neuropathy bilateral along with bunions and peak pre-ulcerative calluses pulses are good  Face-to-face evaluation was done for the purpose of diabetic foot exam and possible diabetic shoes      Assessment & Plan:  Diabetes good control continue current measures watch diet Cholesterol looks great continue current measures and current lab work reviewed Kidney function overall very good blood pressure good Chest dyspnea with slight irregularity her heart-EKG may well have to do lab work and x-ray await EKG Chronic pain 3 prescriptions written patient denies abusing the medication. Drug registry was checked  EKG shows bigeminy Physical exam points toward irregular heartbeat along with early CHF Patient would benefit from further evaluation possible admission for early CHF and evaluation for silent MI ER was spoken with  As for her chronic health illness we will see her back in 3 months  Patient  is proceeding immediately to the ER

## 2017-03-27 NOTE — Telephone Encounter (Signed)
Last Rx for Hydrocodone 5-325 mg filled for 120 tablets on 03/06/17. May fill on 04/06/17.

## 2017-03-27 NOTE — ED Notes (Signed)
Pt ambulated on room air. oxgyen 100%. Minimal assistance. MD notified and aware.

## 2017-03-27 NOTE — ED Triage Notes (Signed)
Patient sent by Dr Lance Sell office. Patient went for basic diabetic check and told them that she has been short of breath at night when she lays down and has had a nonproductive cough. Patient had EKG which showed bigeminy. Dr Wolfgang Phoenix concerned patient has CHF.  Patient reports she has had some chest pain but none at this time. Denies any swelling in lower extremities.

## 2017-03-27 NOTE — ED Provider Notes (Addendum)
[facility] DEPT Provider Note   CSN: [id number] Arrival date & time: 03/27/17  1428     History   Chief Complaint Chief Complaint  Patient presents with  . Shortness of Breath    HPI Elizabeth Aguilar is a 76 y.o. female.  HPI   76 year old female who presents with dyspnea. History of grade 1 diastolic dysfunction, CAD s/p stent, HTN, HLD, and DM. For several weeks now, has been having DOE. States normally without dyspnea going up hills and taking walks, but has been feeling this way recently. Symptoms resolved with rest. Also noticed progressively worsening orthopnea. Last night unable to sleep and had to sit upright in a chair. Does complain of waking up in the night at times feeling winded during this time. No current chest pain, but did have chest pain several weeks ago. No fever, chills, LE edema, or increased abdominal girth. Has had coughing over this time as well. She had routine appointment with Dr. Wolfgang Phoenix today, who recommended that she get initial work-up through the ED.  Dr. Claiborne Billings is patient's cardiologist. Dr. Wolfgang Phoenix is patient's PCP.  Past Medical History:  Diagnosis Date  . CAD (coronary artery disease)    a.  s/p STEMI s/p DES to LAD and RCA (12/2013)  . Chest pain 10/10/2014  . Chronic diastolic CHF (congestive heart failure) ([place])   . Hyperlipidemia   . Irritable bowel syndrome   . Myocardial infarction ([place])   . Pericarditis   . Type 2 diabetes mellitus [facility])     Patient Active Problem List   Diagnosis Date Noted  . 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 Primary osteoarthritis of both knees 02/20/2017  . Pain of left sacroiliac joint 02/20/2017  . Primary osteoarthritis of both hips 02/20/2017  . Primary osteoarthritis of both hands 02/20/2017  . Primary osteoarthritis of both feet 02/20/2017  . DDD (degenerative disc disease), lumbar 02/20/2017  . History of hypertension 02/20/2017  . History of coronary artery disease 02/20/2017  . History of pericarditis  02/20/2017  . History of CHF (congestive heart failure) 02/20/2017  . History of hypercholesterolemia 02/20/2017  . History of diabetes mellitus 02/20/2017  . History of high cholesterol 02/20/2017  . History of tachycardia 02/20/2017  . CAD in native artery 12/05/2015  . Arthritis 11/22/2015  . Pain in the chest   . Chest pain 08/06/2015  . Back pain 08/06/2015  . GERD (gastroesophageal reflux disease) 05/26/2015  . Chronic diastolic CHF (congestive heart failure) ([place])   . Pericarditis 10/10/2014  . Osteopenia 06/03/2014  . Sinus tachycardia 01/18/2014  . 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 12/17/2013    Class: Diagnosis of  . Ischemic cardiomyopathy 12/17/2013    Class: Diagnosis of  . STEMI 12/16/13- LAD DES 12/16/2013  . Chronic back pain 08/12/2013  . Hyperlipemia 04/19/2013  . Essential hypertension, benign 04/19/2013  . Diabetes type 2, controlled ([place]) 04/19/2013  . Chronic arthralgias of knees and hips 04/19/2013    Past Surgical History:  Procedure Laterality Date  . CORONARY ANGIOPLASTY WITH STENT PLACEMENT    . LEFT HEART CATHETERIZATION WITH CORONARY ANGIOGRAM N/A 12/16/2013   Procedure: LEFT HEART CATHETERIZATION WITH CORONARY ANGIOGRAM;  Surgeon: Troy Sine, MD;  Location: [facility] CATH LAB;  Service: Cardiovascular;  Laterality: N/A;  . LEFT HEART CATHETERIZATION WITH CORONARY ANGIOGRAM  12/17/2013   Procedure: LEFT HEART CATHETERIZATION WITH CORONARY ANGIOGRAM;  Surgeon: Troy Sine, MD;  Location: [facility] CATH LAB;  Service: Cardiovascular;;  . PERCUTANEOUS CORONARY STENT INTERVENTION (  PCI-S) N/A 12/17/2013   Procedure: PERCUTANEOUS CORONARY STENT INTERVENTION (PCI-S);  Surgeon: Troy Sine, MD;  Location: [facility] CATH LAB;  Service: Cardiovascular;  Laterality: N/A;    OB History    No data available       Home Medications    Prior to Admission medications   Medication Sig Start Date End Date Taking? Authorizing Provider  albuterol (PROVENTIL HFA;VENTOLIN HFA) 108 (90  Base) MCG/ACT inhaler Inhale 2 puffs into the lungs every 6 (six) hours as needed for wheezing or shortness of breath. 02/05/17  Yes Mikey Kirschner, MD  allopurinol (ZYLOPRIM) 300 MG tablet Take 1.5 tablets (450 mg total) by mouth daily. Patient taking differently: Take 300 mg by mouth daily.  02/25/17 08/24/17 Yes Panwala, Naitik, PA-C  ALPRAZolam (XANAX) 0.5 MG tablet TAKE ONE TABLET TWICE DAILY AS NEEDED Patient taking differently: Take 0.5 mg by mouth 2 (two) times daily as needed for anxiety.  12/27/16  Yes Kathyrn Drown, MD  aspirin EC 81 MG EC tablet Take 1 tablet (81 mg total) by mouth daily. 12/20/13  Yes Eileen Stanford, PA-C  atorvastatin (LIPITOR) 40 MG tablet TAKE 1 TABLET DAILY AT 6PM Patient taking differently: Take 40 mg by mouth daily at 6 PM.  12/27/16  Yes Luking, Elayne Snare, MD  BRILINTA 60 MG TABS tablet TAKE ONE TABLET BY MOUTH TWICE DAILY 02/06/17  Yes Troy Sine, MD  diphenoxylate-atropine (LOMOTIL) 2.5-0.025 MG tablet Take 1 tablet by mouth 4 (four) times daily as needed for diarrhea or loose stools. 07/25/16  Yes Luking, Elayne Snare, MD  esomeprazole ([place]) 40 MG capsule TAKE ONE CAPSULE EACH MORNING Patient taking differently: Take 40 mg by mouth daily.  12/27/16  Yes Luking, Elayne Snare, MD  fluticasone (FLONASE) 50 MCG/ACT nasal spray Place 2 sprays into both nostrils at bedtime.   Yes [provider]  HYDROcodone-acetaminophen (NORCO/VICODIN) 5-325 MG tablet Take 1 tablet by mouth every 6 (six) hours as needed. Patient taking differently: Take 1 tablet by mouth every 6 (six) hours as needed for moderate pain.  03/27/17  Yes Luking, Elayne Snare, MD  ketoconazole (NIZORAL) 2 % cream Apply 1 application topically 2 (two) times daily. Patient taking differently: Apply 1 application topically 2 (two) times daily as needed for irritation. Pt applies to feet. 08/29/16  Yes Luking, Scott A, MD  lisinopril (PRINIVIL,ZESTRIL) 2.5 MG tablet TAKE ONE (1) TABLET EACH DAY Patient  taking differently: Take 2.5 mg by mouth daily.  12/27/16  Yes Kathyrn Drown, MD  loratadine (CLARITIN) 10 MG tablet TAKE ONE (1) TABLET EACH DAY 03/06/17  Yes Kathyrn Drown, MD  metFORMIN (GLUCOPHAGE) 500 MG tablet TAKE ONE TABLET TWICE A DAY WITH FOOD 12/12/16  Yes Luking, Scott A, MD  metoprolol succinate (TOPROL-XL) 100 MG 24 hr tablet Take 100 mg by mouth daily. Take with or immediately following a meal.   Yes [provider]  Multiple Vitamin (MULTIVITAMIN WITH MINERALS) TABS tablet Take 1 tablet by mouth daily.   Yes [provider]  nitroGLYCERIN (NITROSTAT) 0.4 MG SL tablet Place 1 tablet (0.4 mg total) under the tongue every 5 (five) minutes x 3 doses as needed for chest pain. 12/20/13  Yes Eileen Stanford, PA-C  ondansetron (ZOFRAN) 8 MG tablet Take 8 mg by mouth every 8 (eight) hours as needed for nausea or vomiting.   Yes [provider]  furosemide (LASIX) 20 MG tablet Take 1 tablet (20 mg total) by mouth daily. 03/27/17  Forde Dandy, MD    Family History Family History  Problem Relation Age of Onset  . Diabetes Mother   . Diabetes Daughter   . Diabetes Son     Social History Social History  Substance Use Topics  . Smoking status: Never Smoker  . Smokeless tobacco: Never Used  . Alcohol use No     Allergies   Actos [pioglitazone]; Augmentin [amoxicillin-pot clavulanate]; Ceftin [cefuroxime axetil]; Celebrex [celecoxib]; Darvocet [propoxyphene n-acetaminophen]; Doxycycline; Erythromycin; Levofloxacin; Sulfa antibiotics; Vioxx [rofecoxib]; Zetia [ezetimibe]; Zithromax [azithromycin]; and Zocor [simvastatin]   Review of Systems Review of Systems  Constitutional: Negative for fever.  Respiratory: Positive for cough and shortness of breath.   Cardiovascular: Negative for chest pain and leg swelling.  Gastrointestinal: Negative for abdominal distention and vomiting.  Genitourinary: Negative for difficulty urinating.  Allergic/Immunologic:  Negative for immunocompromised state.  Neurological: Negative for syncope.  Hematological: Does not bruise/bleed easily.  Psychiatric/Behavioral: Negative for confusion.  All other systems reviewed and are negative.    Physical Exam Updated Vital Signs BP (!) 157/82   Pulse 85   Temp 97.4 F (36.3 C) (Oral)   Resp 17   Ht 5\' 3"  (1.6 m)   Wt 142 lb (64.4 kg)   SpO2 99%   BMI 25.15 kg/m   Physical Exam Physical Exam  Nursing note and vitals reviewed. Constitutional: Well developed, well nourished, non-toxic, and in no acute distress Head: Normocephalic and atraumatic.  Mouth/Throat: Oropharynx is clear and moist.  Neck: Normal range of motion. Neck supple.  Cardiovascular: Normal rate and regular rhythm.   Pulmonary/Chest: Effort normal and breath sounds normal.  Abdominal: Soft. There is no tenderness. There is no rebound and no guarding.  Musculoskeletal: Normal range of motion. No lower extremity edema Neurological: Alert, no facial droop, fluent speech, moves all extremities symmetrically Skin: Skin is warm and dry.  Psychiatric: Cooperative    ED Treatments / Results  Labs (all labs ordered are listed, but only abnormal results are displayed) Labs Reviewed  BASIC METABOLIC PANEL - Abnormal; Notable for the following:       Result Value   Glucose, Bld 113 (*)    Creatinine, Ser 1.07 (*)    GFR calc non Af Amer 49 (*)    GFR calc Af Amer 57 (*)    All other components within normal limits  CBC - Abnormal; Notable for the following:    RDW 16.0 (*)    All other components within normal limits  BRAIN NATRIURETIC PEPTIDE - Abnormal; Notable for the following:    B Natriuretic Peptide 329.0 (*)    All other components within normal limits  I-STAT TROPOININ, ED    EKG  EKG Interpretation  Date/Time:  Thursday Mar 27 2017 14:40:43 EDT Ventricular Rate:  91 PR Interval:  146 QRS Duration: 84 QT Interval:  390 QTC Calculation: 479 R Axis:   61 Text  Interpretation:  Sinus rhythm with occasional Premature ventricular complexes Otherwise normal ECG no acute ischemic changes  Confirmed by Koriana Stepien MD, [place] Rabenold [phone]) on 03/27/2017 3:49:28 PM       Radiology Dg Chest 2 View  Result Date: 03/27/2017 CLINICAL DATA:  Shortness of breath with cough EXAM: CHEST  2 VIEW COMPARISON:  August 06, 2015 FINDINGS: There is stable scarring in the apices. There is no edema or consolidation. Heart size and pulmonary vascularity are normal. No adenopathy. There is a stent in the left anterior descending coronary artery. There is aortic atherosclerosis. No bone lesions.  IMPRESSION: No edema or consolidation. Bilateral apical scarring. Stable cardiac silhouette. There is aortic atherosclerosis. Electronically Signed   By: Lowella Grip III M.D.   On: 03/27/2017 15:02    Procedures Procedures (including critical care time)  Medications Ordered in ED Medications  furosemide (LASIX) injection 20 mg (20 mg Intravenous Given 03/27/17 1704)     Initial Impression / Assessment and Plan / ED Course  I have reviewed the triage vital signs and the nursing notes.  Pertinent labs & imaging results that were available during my care of the patient were reviewed by me and considered in my medical decision making (see chart for details).     Presenting with symptoms that are concerning for heart failure exacerbation. She is nontoxic and in no acute distress with normal vitals. EKG shows PVCs but no acute ischemia. Troponin is normal. Her BNP is mildly elevated in the 300s. Chest x-ray is visualized and shows no evidence of edema, effusion, or other acute cardiopulmonary processes. She is also able to ambulate in the emergency department with normal oxygenation with minimal assistance. Did trial 20 mg of IV Lasix here in the ED, to good effect. I discussed this patient with Dr. Wolfgang Phoenix who sent her here for initial evaluation. He felt comfortable with close follow-up,  arranging outpatient echocardiogram, and starting outpatient Lasix given reassuring work-up. Patient felt comfortable with this plan as well. Strict return and follow-up instructions reviewed. She expressed understanding of all discharge instructions and felt comfortable with the plan of care.   Final Clinical Impressions(s) / ED Diagnoses   Final diagnoses:  SOB (shortness of breath)  Acute diastolic congestive heart failure (HCC)    New Prescriptions New Prescriptions   FUROSEMIDE (LASIX) 20 MG TABLET    Take 1 tablet (20 mg total) by mouth daily.     Forde Dandy, MD 03/27/17 1821    Forde Dandy, MD 03/27/17 [phone]

## 2017-03-28 ENCOUNTER — Telehealth: Payer: Self-pay | Admitting: Family Medicine

## 2017-03-28 DIAGNOSIS — I5032 Chronic diastolic (congestive) heart failure: Secondary | ICD-10-CM

## 2017-03-28 NOTE — Telephone Encounter (Signed)
Nurse's-please let the patient know that I was able to talk to the ER doctor and review her ER notes. I recommend the following. #1 patient needs to stay on furosemide that was prescribed in the ER each morning #2 we need to set up an echo due to diastolic heart failure #3 patient needs to do metabolic 7 within 2-40 days #4 patient does need to do a follow-up office visit next week with me #5 at the patient is having further trouble she needs to be seen sooner

## 2017-03-28 NOTE — Telephone Encounter (Signed)
Patient advised Dr Nicki Reaper was able to talk to the ER doctor and review her ER notes. Dr Nicki Reaper recommend the following. #1 patient needs to stay on furosemide that was prescribed in the ER each morning #2 we need to set up an echo due to diastolic heart failure #3 patient needs to do metabolic 7 within 0-32 days #4 patient does need to do a follow-up office visit next week with me #5 at the patient is having further trouble she needs to be seen sooner. Patient verbalized understanding. Echo scheduled at [facility] 04/02/17 @2pm -be there at 1:45pm to register. Blood work ordered in [employer]. Patient scheduled follow up office visit with Dr Nicki Reaper next week.

## 2017-03-28 NOTE — Addendum Note (Signed)
Addended by: Dairl Ponder on: 03/28/2017 11:01 AM   Modules accepted: Orders

## 2017-03-28 NOTE — Telephone Encounter (Signed)
Echo scheduled at [facility] 04/02/17 at 2 pm be there at 1:45pm

## 2017-04-01 ENCOUNTER — Encounter: Payer: Self-pay | Admitting: Family Medicine

## 2017-04-02 ENCOUNTER — Ambulatory Visit (HOSPITAL_COMMUNITY)
Admission: RE | Admit: 2017-04-02 | Discharge: 2017-04-02 | Disposition: A | Discharge status: Home / Self Care | Payer: Medicare Other | Source: Ambulatory Visit | Attending: Family Medicine | Admitting: Family Medicine

## 2017-04-02 ENCOUNTER — Ambulatory Visit: Payer: Medicare Other | Admitting: Family Medicine

## 2017-04-02 DIAGNOSIS — I5032 Chronic diastolic (congestive) heart failure: Secondary | ICD-10-CM | POA: Diagnosis not present

## 2017-04-02 LAB — ECHOCARDIOGRAM COMPLETE
AV Area VTI index: 1.06 cm2/m2
AV Area VTI: 1.84 cm2
AV VEL mean LVOT/AV: 0.74
AV area mean vel ind: 1.1 cm2/m2
AV peak Index: 1.08
AV vel: 1.81
AVA: 1.81 cm2
AVAREAMEANV: 1.88 cm2
AVG: 2 mmHg
AVLVOTPG: 2 mmHg
AVPG: 4 mmHg
AVPKVEL: 95.9 cm/s
Ao pk vel: 0.73 m/s
CHL CUP DOP CALC LVOT VTI: 16.8 cm
CHL CUP MV DEC (S): 201
CHL CUP STROKE VOLUME: 30 mL
DOP CAL AO MEAN VELOCITY: 70.7 cm/s
E/e' ratio: 9.54
EWDT: 201 ms
FS: 18 % — AB (ref 28–44)
IV/PV OW: 1.08
LA diam index: 1.7 cm/m2
LA vol index: 25.8 mL/m2
LASIZE: 29 mm
LAVOL: 43.9 mL
LAVOLA4C: 52.4 mL
LEFT ATRIUM END SYS DIAM: 29 mm
LV E/e' medial: 9.54
LV E/e'average: 9.54
LV TDI E'LATERAL: 5.87
LV dias vol index: 44 mL/m2
LV dias vol: 75 mL (ref 46–106)
LV sys vol index: 27 mL/m2
LV sys vol: 45 mL — AB
LVELAT: 5.87 cm/s
LVOT area: 2.54 cm2
LVOT diameter: 18 mm
LVOT peak VTI: 0.71 cm
LVOT peak vel: 69.6 cm/s
LVOTSV: 43 mL
Lateral S' vel: 10.3 cm/s
MV pk A vel: 103 m/s
MVPKEVEL: 56 m/s
PISA EROA: 0.04 cm2
PW: 8.08 mm — AB (ref 0.6–1.1)
RV TAPSE: 14.7 mm
RV sys press: 21 mmHg
Reg peak vel: 211 cm/s
Simpson's disk: 39
TR max vel: 211 cm/s
VTI: 167 cm
VTI: 23.6 cm
Valve area index: 1.06

## 2017-04-02 NOTE — Progress Notes (Signed)
*  PRELIMINARY RESULTS* Echocardiogram 2D Echocardiogram has been performed.  Samuel Germany 04/02/2017, 2:53 PM

## 2017-04-03 LAB — BASIC METABOLIC PANEL WITH GFR
BUN/Creatinine Ratio: 21 (ref 12–28)
BUN: 23 mg/dL (ref 8–27)
CO2: 28 mmol/L (ref 18–29)
Calcium: 10 mg/dL (ref 8.7–10.3)
Chloride: 102 mmol/L (ref 96–106)
Creatinine, Ser: 1.1 mg/dL — ABNORMAL HIGH (ref 0.57–1.00)
GFR calc Af Amer: 56 mL/min/{1.73_m2} — ABNORMAL LOW
GFR calc non Af Amer: 49 mL/min/{1.73_m2} — ABNORMAL LOW
Glucose: 122 mg/dL — ABNORMAL HIGH (ref 65–99)
Potassium: 4.4 mmol/L (ref 3.5–5.2)
Sodium: 145 mmol/L — ABNORMAL HIGH (ref 134–144)

## 2017-04-04 ENCOUNTER — Ambulatory Visit: Payer: Medicare Other | Admitting: Family Medicine

## 2017-04-07 ENCOUNTER — Other Ambulatory Visit: Payer: Self-pay | Admitting: Family Medicine

## 2017-04-08 ENCOUNTER — Encounter: Payer: Self-pay | Admitting: Family Medicine

## 2017-04-08 ENCOUNTER — Ambulatory Visit (INDEPENDENT_AMBULATORY_CARE_PROVIDER_SITE_OTHER): Payer: Medicare Other | Admitting: Family Medicine

## 2017-04-08 VITALS — BP 114/70 | HR 69 | Ht 63.0 in | Wt 141.0 lb

## 2017-04-08 DIAGNOSIS — I5031 Acute diastolic (congestive) heart failure: Secondary | ICD-10-CM | POA: Diagnosis not present

## 2017-04-08 NOTE — Patient Instructions (Signed)
Heart Failure °Heart failure means your heart has trouble pumping blood. This makes it hard for your body to work well. Heart failure is usually a long-term (chronic) condition. You must take good care of yourself and follow your doctor's treatment plan. °Follow these instructions at home: °· Take your heart medicine as told by your doctor. °? Do not stop taking medicine unless your doctor tells you to. °? Do not skip any dose of medicine. °? Refill your medicines before they run out. °? Take other medicines only as told by your doctor or pharmacist. °· Stay active if told by your doctor. The elderly and people with severe heart failure should talk with a doctor about physical activity. °· Eat heart-healthy foods. Choose foods that are without trans fat and are low in saturated fat, cholesterol, and salt (sodium). This includes fresh or frozen fruits and vegetables, fish, lean meats, fat-free or low-fat dairy foods, whole grains, and high-fiber foods. Lentils and dried peas and beans (legumes) are also good choices. °· Limit salt if told by your doctor. °· Cook in a healthy way. Roast, grill, broil, bake, poach, steam, or stir-fry foods. °· Limit fluids as told by your doctor. °· Weigh yourself every morning. Do this after you pee (urinate) and before you eat breakfast. Write down your weight to give to your doctor. °· Take your blood pressure and write it down if your doctor tells you to. °· Ask your doctor how to check your pulse. Check your pulse as told. °· Lose weight if told by your doctor. °· Stop smoking or chewing tobacco. Do not use gum or patches that help you quit without your doctor's approval. °· Schedule and go to doctor visits as told. °· Nonpregnant women should have no more than 1 drink a day. Men should have no more than 2 drinks a day. Talk to your doctor about drinking alcohol. °· Stop illegal drug use. °· Stay current with shots (immunizations). °· Manage your health conditions as told by your  doctor. °· Learn to manage your stress. °· Rest when you are tired. °· If it is really hot outside: °? Avoid intense activities. °? Use air conditioning or fans, or get in a cooler place. °? Avoid caffeine and alcohol. °? Wear loose-fitting, lightweight, and light-colored clothing. °· If it is really cold outside: °? Avoid intense activities. °? Layer your clothing. °? Wear mittens or gloves, a hat, and a scarf when going outside. °? Avoid alcohol. °· Learn about heart failure and get support as needed. °· Get help to maintain or improve your quality of life and your ability to care for yourself as needed. °Contact a doctor if: °· You gain weight quickly. °· You are more short of breath than usual. °· You cannot do your normal activities. °· You tire easily. °· You cough more than normal, especially with activity. °· You have any or more puffiness (swelling) in areas such as your hands, feet, ankles, or belly (abdomen). °· You cannot sleep because it is hard to breathe. °· You feel like your heart is beating fast (palpitations). °· You get dizzy or light-headed when you stand up. °Get help right away if: °· You have trouble breathing. °· There is a change in mental status, such as becoming less alert or not being able to focus. °· You have chest pain or discomfort. °· You faint. °This information is not intended to replace advice given to you by your health care provider. Make sure you   discuss any questions you have with your health care provider. °Document Released: 08/06/2008 Document Revised: 04/04/2016 Document Reviewed: 12/14/2012 °Elsevier Interactive Patient Education © 2017 [employer] ° °

## 2017-04-08 NOTE — Progress Notes (Signed)
   Subjective:    Patient ID: Elizabeth Aguilar, female    DOB: December 25, 1940, 76 y.o.   MRN: [id number]  HPIFollow up ED visit for shortness of breath.  Patient was in the ER diagnosed with mild diastolic dysfunction and had echo as an outpatient is following up female taking her Lasix on a regular basis tolerating it well no PND no orthopnea and no swelling in the legs she does try to watch her diet to some degree  Patient has a history of heart disease follows with Dr. Claiborne Billings had severe left ventricular dysfunction in the past which improved to 55% but now it is currently at 40-45%   Review of Systems  Constitutional: Negative for activity change, fatigue and fever.  Respiratory: Negative for cough and shortness of breath.   Cardiovascular: Negative for chest pain and leg swelling.  Neurological: Negative for headaches.       Objective:   Physical Exam  Constitutional: She appears well-nourished. No distress.  Cardiovascular: Normal rate, regular rhythm and normal heart sounds.   No murmur heard. Pulmonary/Chest: Effort normal and breath sounds normal. No respiratory distress.  Musculoskeletal: She exhibits no edema.  Lymphadenopathy:    She has no cervical adenopathy.  Neurological: She is alert. She exhibits normal muscle tone.  Psychiatric: Her behavior is normal.  Vitals reviewed.   Patient was educated about avoiding excessive salt also educated about taking her medicines plus weighing herself on a daily basis and if her weight goes up more than a few pounds over 5 days to notify us immediately also if she has PND orthopnea to notify us      Assessment & Plan:  Diastolic dysfunction with heart failure Mild systolic decrease Continue furosemide Continue lisinopril Repeat blood work showed mild renal insufficiency we will recheck again in the fall We'll send a copy of this note to Dr. Claiborne Billings they may or may not once to see her sooner than August otherwise she follows up with [place] in  August

## 2017-04-24 ENCOUNTER — Other Ambulatory Visit: Payer: Self-pay | Admitting: Cardiovascular Disease

## 2017-04-24 ENCOUNTER — Other Ambulatory Visit: Payer: Self-pay | Admitting: Family Medicine

## 2017-04-25 NOTE — Telephone Encounter (Signed)
We will refill all of her medicines 6 except for bright length which is something her heart doctor needs to refill

## 2017-04-28 ENCOUNTER — Other Ambulatory Visit: Payer: Self-pay

## 2017-04-28 ENCOUNTER — Telehealth: Payer: Self-pay | Admitting: Family Medicine

## 2017-04-28 MED ORDER — FUROSEMIDE 20 MG PO TABS: 20.0000 mg | ORAL_TABLET | Freq: Every day | ORAL | 4 refills | Status: DC

## 2017-04-28 NOTE — Telephone Encounter (Signed)
Spoke with patient and informed her per Dr.Scott Luking- We sent in refills on requested medication. Patient verbalized understanding.

## 2017-04-28 NOTE — Telephone Encounter (Signed)
May have this +4 refills needs follow-up visit in August already has this she needs to keep

## 2017-04-28 NOTE — Telephone Encounter (Signed)
Med started in the ED on 5/17. Last last seen dr. Nicki Reaper on 5/29

## 2017-04-28 NOTE — Telephone Encounter (Signed)
Pt is needing refills on  furosemide (LASIX) 20 MG tablet   [facility]

## 2017-04-30 ENCOUNTER — Telehealth: Payer: Self-pay | Admitting: Family Medicine

## 2017-04-30 DIAGNOSIS — Z79899 Other long term (current) drug therapy: Secondary | ICD-10-CM

## 2017-04-30 NOTE — Telephone Encounter (Signed)
Discussed with pt. Pt verbalized understanding. Order for met 7 put in and pt notified to go to lab and hold lasix til dr gets results.

## 2017-04-30 NOTE — Telephone Encounter (Signed)
Left message to return call 

## 2017-04-30 NOTE — Telephone Encounter (Signed)
Met 7 resultt, hold lasix pending

## 2017-04-30 NOTE — Telephone Encounter (Signed)
Pt states she has been taking lasix since going to the hospital may 17th. Having cramps in feet, legs and hands since starting med.

## 2017-04-30 NOTE — Telephone Encounter (Signed)
Patient picked up the refill for furosemide (LASIX) 20 MG tablet yesterday but has decided today that she just can't take that medicine because it is causing her to have terrible cramps.  Needs advice.

## 2017-05-02 DIAGNOSIS — Z79899 Other long term (current) drug therapy: Secondary | ICD-10-CM | POA: Diagnosis not present

## 2017-05-03 LAB — BASIC METABOLIC PANEL
BUN/Creatinine Ratio: 19 (ref 12–28)
BUN: 20 mg/dL (ref 8–27)
CHLORIDE: 107 mmol/L — AB (ref 96–106)
CO2: 22 mmol/L (ref 20–29)
CREATININE: 1.07 mg/dL — AB (ref 0.57–1.00)
Calcium: 9.8 mg/dL (ref 8.7–10.3)
GFR calc Af Amer: 58 mL/min/{1.73_m2} — ABNORMAL LOW (ref >59)
GFR calc non Af Amer: 51 mL/min/{1.73_m2} — ABNORMAL LOW (ref >59)
GLUCOSE: 121 mg/dL — AB (ref 65–99)
POTASSIUM: 4.5 mmol/L (ref 3.5–5.2)
SODIUM: 145 mmol/L — AB (ref 134–144)

## 2017-05-06 ENCOUNTER — Other Ambulatory Visit: Payer: Self-pay | Admitting: Family Medicine

## 2017-05-06 NOTE — Telephone Encounter (Signed)
As for the Zofran I recommend 24 at a time one 3 times a day when necessary with 5 refills the other one may have 3 refills granted for the Nexium

## 2017-05-07 ENCOUNTER — Telehealth: Payer: Self-pay | Admitting: Cardiovascular Disease

## 2017-05-07 MED ORDER — TICAGRELOR 60 MG PO TABS: 60.0000 mg | ORAL_TABLET | Freq: Two times a day (BID) | ORAL | 1 refills | Status: DC

## 2017-05-07 NOTE — Telephone Encounter (Signed)
New message   Pt c/o medication issue:  1. Name of Medication: BRILINTA 60 MG TABS tablet  2. How are you currently taking this medication (dosage and times per day)? 60mg   3. Are you having a reaction (difficulty breathing--STAT)? no  4. What is your medication issue? Pt went to pick up Brilinta from pharmacy and the pharmacy told her that she needed to make an appt but she already has an appt scheduled in August with Dr. Claiborne Billings and she wants to know what she needs to do because she is out of the medication.

## 2017-05-07 NOTE — Telephone Encounter (Signed)
Refill sent until f/u app

## 2017-05-15 ENCOUNTER — Telehealth: Payer: Self-pay | Admitting: Family Medicine

## 2017-05-15 NOTE — Telephone Encounter (Signed)
Kidney function lab work stable. Patient may have a copy of if she desires

## 2017-05-15 NOTE — Telephone Encounter (Signed)
The patient may stay off of Lasix but she needs to monitor her weight every morning. If her weight goes up more than 3 pounds over a 3 day's pain and she will need to take furosemide one each morning until her weight comes back down. If she has any trouble with chest congestion shortness of breath or waking up in the middle the night short of breath and having to sit on the edge of the bed all of those can be signs of increase fluid which may well need immediate attention and taking furosemide. If any problems please follow-up sooner otherwise follow-up at regular appointment in August. Keep appointment with cardiology. Nurse's-please change orders on furosemide to state 1 every morning when necessary edema

## 2017-05-15 NOTE — Telephone Encounter (Signed)
Patient called requesting results to her recent labwork.

## 2017-05-15 NOTE — Telephone Encounter (Signed)
Pt aware her labs are stable,but wants to know if she needs to restart lasix 20 mg one per day. She states she had had the gout so bad and had a lot of cramping so she came off of it on her on. Please advise.

## 2017-05-15 NOTE — Telephone Encounter (Signed)
I called left a vm asked that she r/c. 

## 2017-05-16 NOTE — Telephone Encounter (Signed)
Patient stated she just restarted the Lasix 3 days ago cause she felt like she was having fluid in chest and cardiology rec she take it again so she is taking it again every day and will follow up with [place] as scheduled as well as cardiology.

## 2017-06-02 ENCOUNTER — Encounter: Payer: Self-pay | Admitting: Family Medicine

## 2017-06-02 ENCOUNTER — Ambulatory Visit (INDEPENDENT_AMBULATORY_CARE_PROVIDER_SITE_OTHER): Payer: Medicare Other | Admitting: Family Medicine

## 2017-06-02 VITALS — Temp 97.4°F | Ht 62.0 in | Wt 144.6 lb

## 2017-06-02 DIAGNOSIS — R300 Dysuria: Secondary | ICD-10-CM | POA: Diagnosis not present

## 2017-06-02 DIAGNOSIS — N3000 Acute cystitis without hematuria: Secondary | ICD-10-CM | POA: Diagnosis not present

## 2017-06-02 LAB — POCT URINALYSIS DIPSTICK
Spec Grav, UA: 1.02 (ref 1.010–1.025)
pH, UA: 6 (ref 5.0–8.0)

## 2017-06-02 MED ORDER — NITROFURANTOIN MONOHYD MACRO 100 MG PO CAPS: ORAL_CAPSULE | ORAL | 0 refills | Status: DC

## 2017-06-02 MED ORDER — NITROGLYCERIN 0.4 MG SL SUBL: 0.4000 mg | SUBLINGUAL_TABLET | SUBLINGUAL | 12 refills | Status: AC | PRN

## 2017-06-02 NOTE — Patient Instructions (Signed)
Azostandard, take one or tewo tablets with each meal

## 2017-06-02 NOTE — Progress Notes (Signed)
   Subjective:    Patient ID: Elizabeth Aguilar, female    DOB: [birth year], 76 y.o.   MRN: [id number]  HPI  Patient arrives with c/o dysuria for 3 days.  Notes pos dysuria,   incr freq  Placed on the fluid pill wonder is related  No fever or chills  No headache or chest  Review of Systems No headache, no major weight loss or weight gain, no chest pain no back pain abdominal pain no change in bowel habits complete ROS otherwise negative     Objective:   Physical Exam  Alert vitals stable, NAD. Blood pressure good on repeat. HEENT normal. Lungs clear. Heart regular rate and rhythm. No CVA tenderness  Urine numerous white blood cells high powered field      Assessment & Plan:  Impression urinary tract infection plan antibiotics prescribed. Symptom care discussed warning signs discussed

## 2017-06-07 ENCOUNTER — Encounter (HOSPITAL_COMMUNITY): Payer: Self-pay | Admitting: Emergency Medicine

## 2017-06-07 DIAGNOSIS — I10 Essential (primary) hypertension: Secondary | ICD-10-CM | POA: Insufficient documentation

## 2017-06-07 DIAGNOSIS — E119 Type 2 diabetes mellitus without complications: Secondary | ICD-10-CM | POA: Insufficient documentation

## 2017-06-07 DIAGNOSIS — Z79899 Other long term (current) drug therapy: Secondary | ICD-10-CM | POA: Diagnosis not present

## 2017-06-07 DIAGNOSIS — R0602 Shortness of breath: Secondary | ICD-10-CM | POA: Diagnosis present

## 2017-06-07 DIAGNOSIS — I2510 Atherosclerotic heart disease of native coronary artery without angina pectoris: Secondary | ICD-10-CM | POA: Diagnosis not present

## 2017-06-07 DIAGNOSIS — R079 Chest pain, unspecified: Secondary | ICD-10-CM | POA: Diagnosis not present

## 2017-06-07 DIAGNOSIS — R091 Pleurisy: Secondary | ICD-10-CM | POA: Insufficient documentation

## 2017-06-07 DIAGNOSIS — I509 Heart failure, unspecified: Secondary | ICD-10-CM | POA: Insufficient documentation

## 2017-06-07 DIAGNOSIS — Z7984 Long term (current) use of oral hypoglycemic drugs: Secondary | ICD-10-CM | POA: Diagnosis not present

## 2017-06-07 LAB — BASIC METABOLIC PANEL
ANION GAP: 8 (ref 5–15)
BUN: 19 mg/dL (ref 6–20)
CALCIUM: 9.2 mg/dL (ref 8.9–10.3)
CHLORIDE: 104 mmol/L (ref 101–111)
CO2: 27 mmol/L (ref 22–32)
Creatinine, Ser: 1.4 mg/dL — ABNORMAL HIGH (ref 0.44–1.00)
GFR calc non Af Amer: 35 mL/min — ABNORMAL LOW (ref >60)
GFR, EST AFRICAN AMERICAN: 41 mL/min — AB (ref >60)
GLUCOSE: 127 mg/dL — AB (ref 65–99)
POTASSIUM: 3.5 mmol/L (ref 3.5–5.1)
Sodium: 139 mmol/L (ref 135–145)

## 2017-06-07 LAB — CBC
HCT: 35.7 % — ABNORMAL LOW (ref 36.0–46.0)
HEMOGLOBIN: 11.6 g/dL — AB (ref 12.0–15.0)
MCH: 30.2 pg (ref 26.0–34.0)
MCHC: 32.5 g/dL (ref 30.0–36.0)
MCV: 93 fL (ref 78.0–100.0)
Platelets: 296 10*3/uL (ref 150–400)
RBC: 3.84 MIL/uL — ABNORMAL LOW (ref 3.87–5.11)
RDW: 15.6 % — ABNORMAL HIGH (ref 11.5–15.5)
WBC: 10.9 10*3/uL — ABNORMAL HIGH (ref 4.0–10.5)

## 2017-06-07 LAB — I-STAT TROPONIN, ED: TROPONIN I, POC: 0 ng/mL (ref 0.00–0.08)

## 2017-06-07 NOTE — ED Triage Notes (Signed)
Pt c/o biltateral 7/10 cp with sob, more painful on the left side, pt denies any nausea, vomiting or dizziness.

## 2017-06-08 ENCOUNTER — Emergency Department (HOSPITAL_COMMUNITY)
Admission: EM | Admit: 2017-06-08 | Discharge: 2017-06-08 | Disposition: A | Discharge status: Home / Self Care | Payer: Medicare Other | Attending: Emergency Medicine | Admitting: Emergency Medicine

## 2017-06-08 ENCOUNTER — Emergency Department (HOSPITAL_COMMUNITY): Payer: Medicare Other

## 2017-06-08 DIAGNOSIS — R091 Pleurisy: Secondary | ICD-10-CM | POA: Diagnosis not present

## 2017-06-08 DIAGNOSIS — R079 Chest pain, unspecified: Secondary | ICD-10-CM | POA: Diagnosis not present

## 2017-06-08 LAB — D-DIMER, QUANTITATIVE (NOT AT ARMC): D DIMER QUANT: 0.29 ug{FEU}/mL (ref 0.00–0.50)

## 2017-06-08 LAB — I-STAT TROPONIN, ED: TROPONIN I, POC: 0.02 ng/mL (ref 0.00–0.08)

## 2017-06-08 NOTE — ED Provider Notes (Signed)
[facility] DEPT Provider Note   CSN: [id number] Arrival date & time: 06/07/17  2226  By signing my name below, I, Ny'Kea Lewis, attest that this documentation has been prepared under the direction and in the presence of Ripley Fraise, MD. Electronically Signed: Lise Auer, ED Scribe. 06/08/17. 2:48 AM.   History   Chief Complaint Chief Complaint  Patient presents with  . Shortness of Breath  . Chest Pain   The history is provided by the patient. No language interpreter was used.    HPI HPI Comments: DAYLAH SAYAVONG is a 76 y.o. female with a PMHx of CAD, CHF, HLD, MI, and DM II who presents to the Emergency Department complaining of gradually worsening, left sided chest pain that began a few weeks ago but worsened tonight. She notes associated cough, shortness of breath, and nausea. Pt reports she was previously hospitalized and she had fluid removed from her chest and since then she has not been feeling well. Pt notes her pain is not similar to the pain she experienced with her MI's. No h/o PE/DVT. She notes her pain is exacerbated with deep breathing. Denies abdominal pain or any other acute associated symptoms at this time.   Past Medical History:  Diagnosis Date  . CAD (coronary artery disease)    a.  s/p STEMI s/p DES to LAD and RCA (12/2013)  . Chest pain 10/10/2014  . Chronic diastolic CHF (congestive heart failure) ([place])   . Hyperlipidemia   . Irritable bowel syndrome   . Myocardial infarction ([place])   . Pericarditis   . Type 2 diabetes mellitus [facility])     Patient Active Problem List   Diagnosis Date Noted  . 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 Primary osteoarthritis of both knees 02/20/2017  . Pain of left sacroiliac joint 02/20/2017  . Primary osteoarthritis of both hips 02/20/2017  . Primary osteoarthritis of both hands 02/20/2017  . Primary osteoarthritis of both feet 02/20/2017  . DDD (degenerative disc disease), lumbar 02/20/2017  . History of hypertension  02/20/2017  . History of coronary artery disease 02/20/2017  . History of pericarditis 02/20/2017  . History of CHF (congestive heart failure) 02/20/2017  . History of hypercholesterolemia 02/20/2017  . History of diabetes mellitus 02/20/2017  . History of high cholesterol 02/20/2017  . History of tachycardia 02/20/2017  . CAD in native artery 12/05/2015  . Arthritis 11/22/2015  . Pain in the chest   . Chest pain 08/06/2015  . Back pain 08/06/2015  . GERD (gastroesophageal reflux disease) 05/26/2015  . Chronic diastolic CHF (congestive heart failure) ([place])   . Pericarditis 10/10/2014  . Osteopenia 06/03/2014  . Sinus tachycardia 01/18/2014  . 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 12/17/2013    Class: Diagnosis of  . Ischemic cardiomyopathy 12/17/2013    Class: Diagnosis of  . STEMI 12/16/13- LAD DES 12/16/2013  . Chronic back pain 08/12/2013  . Hyperlipemia 04/19/2013  . Essential hypertension, benign 04/19/2013  . Diabetes type 2, controlled ([place]) 04/19/2013  . Chronic arthralgias of knees and hips 04/19/2013    Past Surgical History:  Procedure Laterality Date  . CORONARY ANGIOPLASTY WITH STENT PLACEMENT    . LEFT HEART CATHETERIZATION WITH CORONARY ANGIOGRAM N/A 12/16/2013   Procedure: LEFT HEART CATHETERIZATION WITH CORONARY ANGIOGRAM;  Surgeon: Troy Sine, MD;  Location: [facility] CATH LAB;  Service: Cardiovascular;  Laterality: N/A;  . LEFT HEART CATHETERIZATION WITH CORONARY ANGIOGRAM  12/17/2013   Procedure: LEFT HEART CATHETERIZATION WITH CORONARY ANGIOGRAM;  Surgeon: Troy Sine,  MD;  Location: [facility] CATH LAB;  Service: Cardiovascular;;  . PERCUTANEOUS CORONARY STENT INTERVENTION (PCI-S) N/A 12/17/2013   Procedure: PERCUTANEOUS CORONARY STENT INTERVENTION (PCI-S);  Surgeon: Troy Sine, MD;  Location: [facility] CATH LAB;  Service: Cardiovascular;  Laterality: N/A;    OB History    No data available     Home Medications    Prior to Admission medications   Medication Sig Start Date End  Date Taking? Authorizing Provider  allopurinol (ZYLOPRIM) 300 MG tablet Take 1.5 tablets (450 mg total) by mouth daily. Patient taking differently: Take 300 mg by mouth daily.  02/25/17 08/24/17  Panwala, Naitik, PA-C  ALPRAZolam (XANAX) 0.5 MG tablet TAKE ONE TABLET TWICE DAILY AS NEEDED Patient taking differently: Take 0.5 mg by mouth 2 (two) times daily as needed for anxiety.  12/27/16   Kathyrn Drown, MD  aspirin EC 81 MG EC tablet Take 1 tablet (81 mg total) by mouth daily. 12/20/13   Eileen Stanford, PA-C  atorvastatin (LIPITOR) 40 MG tablet TAKE 1 TABLET DAILY AT 6PM 04/25/17   Kathyrn Drown, MD  diphenoxylate-atropine (LOMOTIL) 2.5-0.025 MG tablet Take 1 tablet by mouth 4 (four) times daily as needed for diarrhea or loose stools. 07/25/16   Kathyrn Drown, MD  esomeprazole (NEXIUM) 40 MG capsule TAKE ONE CAPSULE EACH MORNING 05/06/17   Kathyrn Drown, MD  fluticasone (FLONASE) 50 MCG/ACT nasal spray Place 2 sprays into both nostrils at bedtime.    [provider]  fluticasone Asencion Islam) 50 MCG/ACT nasal spray USE 2 SPRAYS IN EACH NOSTRIL DAILY 04/08/17   Mikey Kirschner, MD  furosemide (LASIX) 20 MG tablet Take 1 tablet (20 mg total) by mouth daily. 04/28/17   Kathyrn Drown, MD  HYDROcodone-acetaminophen (NORCO/VICODIN) 5-325 MG tablet Take 1 tablet by mouth every 6 (six) hours as needed. Patient taking differently: Take 1 tablet by mouth every 6 (six) hours as needed for moderate pain.  03/27/17   Kathyrn Drown, MD  ketoconazole (NIZORAL) 2 % cream Apply 1 application topically 2 (two) times daily. Patient taking differently: Apply 1 application topically 2 (two) times daily as needed for irritation. Pt applies to feet. 08/29/16   Kathyrn Drown, MD  lisinopril (PRINIVIL,ZESTRIL) 2.5 MG tablet TAKE ONE (1) TABLET EACH DAY Patient taking differently: Take 2.5 mg by mouth daily.  12/27/16   Kathyrn Drown, MD  loratadine (CLARITIN) 10 MG tablet TAKE ONE (1) TABLET EACH DAY  03/06/17   Kathyrn Drown, MD  metFORMIN (GLUCOPHAGE) 500 MG tablet TAKE ONE TABLET TWICE A DAY WITH FOOD 04/25/17   Kathyrn Drown, MD  metoprolol succinate (TOPROL-XL) 100 MG 24 hr tablet TAKE 1/2 TABLET TWICE DAILY 04/25/17   Kathyrn Drown, MD  Multiple Vitamin (MULTIVITAMIN WITH MINERALS) TABS tablet Take 1 tablet by mouth daily.    [provider]  nitrofurantoin, macrocrystal-monohydrate, (MACROBID) 100 MG capsule One po twice daily for 7 days 06/02/17   Mikey Kirschner, MD  nitroGLYCERIN (NITROSTAT) 0.4 MG SL tablet Place 1 tablet (0.4 mg total) under the tongue every 5 (five) minutes x 3 doses as needed for chest pain. 06/02/17   Mikey Kirschner, MD  ondansetron (ZOFRAN) 8 MG tablet TAKE ONE TABLET EVERY 8 HOURS AS NEEDED 05/06/17   Kathyrn Drown, MD  ticagrelor (BRILINTA) 60 MG TABS tablet Take 1 tablet (60 mg total) by mouth 2 (two) times daily. 05/07/17   Troy Sine, MD  VENTOLIN HFA 108 (  90 Base) MCG/ACT inhaler USE 2 PUFFS EVERY 6 HOURS AS NEEDED 04/08/17   Kathyrn Drown, MD   Family History Family History  Problem Relation Age of Onset  . Diabetes Mother   . Diabetes Daughter   . Diabetes Son    Social History Social History  Substance Use Topics  . Smoking status: Never Smoker  . Smokeless tobacco: Never Used  . Alcohol use No   Allergies   Actos [pioglitazone]; Augmentin [amoxicillin-pot clavulanate]; Ceftin [cefuroxime axetil]; Celebrex [celecoxib]; Darvocet [propoxyphene n-acetaminophen]; Doxycycline; Erythromycin; Levofloxacin; Sulfa antibiotics; Vioxx [rofecoxib]; Zetia [ezetimibe]; Zithromax [azithromycin]; and Zocor [simvastatin]  Review of Systems Review of Systems  Constitutional: Negative for fever.  Cardiovascular: Positive for chest pain.  All other systems reviewed and are negative.  Physical Exam Updated Vital Signs BP (!) 145/69 (BP Location: Right Arm)   Pulse 83   Temp 97.7 F (36.5 C)   Resp 18   Ht 5\' 2"  (1.575 m)   Wt 144  lb (65.3 kg)   SpO2 99%   BMI 26.34 kg/m   Physical Exam CONSTITUTIONAL: Elderly, no distress HEAD: Normocephalic/atraumatic EYES: EOMI/PERRL ENMT: Mucous membranes moist NECK: supple no meningeal signs SPINE/BACK:entire spine nontender CV: S1/S2 noted, no murmurs/rubs/gallops noted LUNGS: Lungs are clear to auscultation bilaterally, no apparent distress ABDOMEN: soft, nontender, no rebound or guarding, bowel sounds noted throughout abdomen GU:no cva tenderness NEURO: Pt is awake/alert/appropriate, moves all extremitiesx4.  No facial droop.   EXTREMITIES: pulses normal/equal, full ROM, no calf tenderness or edema SKIN: warm, color normal PSYCH: no abnormalities of mood noted, alert and oriented to situation   ED Treatments / Results  DIAGNOSTIC STUDIES: Oxygen Saturation is 99% on RA, normal by my interpretation.   COORDINATION OF CARE: 2:01 AM-Discussed next steps with pt. Pt verbalized understanding and is agreeable with the plan.   Labs (all labs ordered are listed, but only abnormal results are displayed) Labs Reviewed  BASIC METABOLIC PANEL - Abnormal; Notable for the following:       Result Value   Glucose, Bld 127 (*)    Creatinine, Ser 1.40 (*)    GFR calc non Af Amer 35 (*)    GFR calc Af Amer 41 (*)    All other components within normal limits  CBC - Abnormal; Notable for the following:    WBC 10.9 (*)    RBC 3.84 (*)    Hemoglobin 11.6 (*)    HCT 35.7 (*)    RDW 15.6 (*)    All other components within normal limits  D-DIMER, QUANTITATIVE (NOT AT [facility])  I-STAT TROPONIN, ED  I-STAT TROPONIN, ED   EKG  EKG Interpretation  Date/Time:  Saturday June 07 2017 22:30:22 EDT Ventricular Rate:  80 PR Interval:  148 QRS Duration: 84 QT Interval:  386 QTC Calculation: 445 R Axis:   72 Text Interpretation:  Normal sinus rhythm Nonspecific ST and T wave abnormality Abnormal ECG No significant change since last tracing Confirmed by Ripley Fraise [phone]) on  06/08/2017 1:52:39 AM       Radiology Dg Chest 2 View  Result Date: 06/08/2017 CLINICAL DATA:  Dyspnea and bilateral chest pain, left worse than right. EXAM: CHEST  2 VIEW COMPARISON:  03/27/2017 FINDINGS: Mild hyperinflation. The lungs are clear. The pulmonary vasculature is normal. No pleural effusions. Coronary artery stents. Upper normal heart size, unchanged. Unremarkable hilar and mediastinal contours. IMPRESSION: Mild hyperinflation. Upper normal heart size. No consolidation or effusion. Electronically Signed   By: Shaune Pascal  Alroy Dust M.D.   On: 06/08/2017 00:09    Procedures Procedures (including critical care time)  Medications Ordered in ED Medications - No data to display   Initial Impression / Assessment and Plan / ED Course  I have reviewed the triage vital signs and the nursing notes.  Pertinent labs & imaging results that were available during my care of the patient were reviewed by me and considered in my medical decision making (see chart for details).     Pt well appearing She has had CP for weeks but recent worsening She reports it is pleuritic D-dimer negative, low suspicion for PE I doubt ACS given history/exam and no EKG changes CXR negative No hypoxia or tachycardia Will d/c home Pt requesting d/c She does have previous h/o pericarditis, which is possible but suspect mild case as she is well appearing/no distress She has cardiology f/u on 8/10 per patient  Will d/c home We discussed strict ER return precautions   Final Clinical Impressions(s) / ED Diagnoses   Final diagnoses:  Pleurisy    New Prescriptions New Prescriptions   No medications on file  I personally performed the services described in this documentation, which was scribed in my presence. The recorded information has been reviewed and is accurate.        Ripley Fraise, MD 06/08/17 [phone]

## 2017-06-20 ENCOUNTER — Emergency Department (HOSPITAL_COMMUNITY): Payer: No Typology Code available for payment source

## 2017-06-20 ENCOUNTER — Encounter (HOSPITAL_COMMUNITY): Payer: Self-pay

## 2017-06-20 ENCOUNTER — Ambulatory Visit: Payer: Medicare Other | Admitting: Cardiovascular Disease

## 2017-06-20 ENCOUNTER — Inpatient Hospital Stay (HOSPITAL_COMMUNITY)
Admission: EM | Admit: 2017-06-20 | Discharge: 2017-06-23 | DRG: 605 | Disposition: A | Discharge status: Home / Self Care | Payer: No Typology Code available for payment source | Attending: General Surgery | Admitting: General Surgery

## 2017-06-20 DIAGNOSIS — S1093XA Contusion of unspecified part of neck, initial encounter: Secondary | ICD-10-CM | POA: Diagnosis not present

## 2017-06-20 DIAGNOSIS — T07XXXA Unspecified multiple injuries, initial encounter: Secondary | ICD-10-CM | POA: Diagnosis not present

## 2017-06-20 DIAGNOSIS — R079 Chest pain, unspecified: Secondary | ICD-10-CM | POA: Diagnosis not present

## 2017-06-20 DIAGNOSIS — S8992XA Unspecified injury of left lower leg, initial encounter: Secondary | ICD-10-CM | POA: Diagnosis not present

## 2017-06-20 DIAGNOSIS — S8010XA Contusion of unspecified lower leg, initial encounter: Secondary | ICD-10-CM | POA: Diagnosis present

## 2017-06-20 DIAGNOSIS — S20219A Contusion of unspecified front wall of thorax, initial encounter: Secondary | ICD-10-CM | POA: Diagnosis present

## 2017-06-20 DIAGNOSIS — S40011A Contusion of right shoulder, initial encounter: Secondary | ICD-10-CM | POA: Diagnosis present

## 2017-06-20 DIAGNOSIS — I252 Old myocardial infarction: Secondary | ICD-10-CM | POA: Diagnosis not present

## 2017-06-20 DIAGNOSIS — Z9889 Other specified postprocedural states: Secondary | ICD-10-CM

## 2017-06-20 DIAGNOSIS — Z882 Allergy status to sulfonamides status: Secondary | ICD-10-CM | POA: Diagnosis not present

## 2017-06-20 DIAGNOSIS — S0990XA Unspecified injury of head, initial encounter: Secondary | ICD-10-CM | POA: Diagnosis not present

## 2017-06-20 DIAGNOSIS — S0083XA Contusion of other part of head, initial encounter: Secondary | ICD-10-CM | POA: Diagnosis not present

## 2017-06-20 DIAGNOSIS — Z955 Presence of coronary angioplasty implant and graft: Secondary | ICD-10-CM

## 2017-06-20 DIAGNOSIS — S301XXA Contusion of abdominal wall, initial encounter: Secondary | ICD-10-CM | POA: Diagnosis present

## 2017-06-20 DIAGNOSIS — E119 Type 2 diabetes mellitus without complications: Secondary | ICD-10-CM | POA: Diagnosis present

## 2017-06-20 DIAGNOSIS — S3991XA Unspecified injury of abdomen, initial encounter: Secondary | ICD-10-CM | POA: Diagnosis not present

## 2017-06-20 DIAGNOSIS — M25571 Pain in right ankle and joints of right foot: Secondary | ICD-10-CM | POA: Diagnosis present

## 2017-06-20 DIAGNOSIS — I2510 Atherosclerotic heart disease of native coronary artery without angina pectoris: Secondary | ICD-10-CM | POA: Diagnosis present

## 2017-06-20 DIAGNOSIS — Z833 Family history of diabetes mellitus: Secondary | ICD-10-CM

## 2017-06-20 DIAGNOSIS — R102 Pelvic and perineal pain: Secondary | ICD-10-CM | POA: Diagnosis not present

## 2017-06-20 DIAGNOSIS — S3993XA Unspecified injury of pelvis, initial encounter: Secondary | ICD-10-CM | POA: Diagnosis not present

## 2017-06-20 DIAGNOSIS — I5032 Chronic diastolic (congestive) heart failure: Secondary | ICD-10-CM | POA: Diagnosis present

## 2017-06-20 DIAGNOSIS — Z5309 Procedure and treatment not carried out because of other contraindication: Secondary | ICD-10-CM

## 2017-06-20 DIAGNOSIS — S20212A Contusion of left front wall of thorax, initial encounter: Secondary | ICD-10-CM | POA: Diagnosis not present

## 2017-06-20 DIAGNOSIS — Y92410 Unspecified street and highway as the place of occurrence of the external cause: Secondary | ICD-10-CM

## 2017-06-20 DIAGNOSIS — R0789 Other chest pain: Secondary | ICD-10-CM | POA: Diagnosis not present

## 2017-06-20 DIAGNOSIS — M25531 Pain in right wrist: Secondary | ICD-10-CM | POA: Diagnosis present

## 2017-06-20 DIAGNOSIS — Z888 Allergy status to other drugs, medicaments and biological substances status: Secondary | ICD-10-CM

## 2017-06-20 DIAGNOSIS — S36498A Other injury of other part of small intestine, initial encounter: Secondary | ICD-10-CM | POA: Diagnosis not present

## 2017-06-20 DIAGNOSIS — S8991XA Unspecified injury of right lower leg, initial encounter: Secondary | ICD-10-CM | POA: Diagnosis not present

## 2017-06-20 DIAGNOSIS — S40012A Contusion of left shoulder, initial encounter: Principal | ICD-10-CM | POA: Diagnosis present

## 2017-06-20 LAB — GLUCOSE, CAPILLARY: Glucose-Capillary: 110 mg/dL — ABNORMAL HIGH (ref 65–99)

## 2017-06-20 LAB — COMPREHENSIVE METABOLIC PANEL
ALBUMIN: 3.2 g/dL — AB (ref 3.5–5.0)
ALT: 13 U/L — AB (ref 14–54)
AST: 21 U/L (ref 15–41)
Alkaline Phosphatase: 149 U/L — ABNORMAL HIGH (ref 38–126)
Anion gap: 11 (ref 5–15)
BUN: 20 mg/dL (ref 6–20)
CHLORIDE: 104 mmol/L (ref 101–111)
CO2: 25 mmol/L (ref 22–32)
CREATININE: 1.27 mg/dL — AB (ref 0.44–1.00)
Calcium: 9.3 mg/dL (ref 8.9–10.3)
GFR calc Af Amer: 46 mL/min — ABNORMAL LOW (ref >60)
GFR, EST NON AFRICAN AMERICAN: 40 mL/min — AB (ref >60)
Glucose, Bld: 143 mg/dL — ABNORMAL HIGH (ref 65–99)
POTASSIUM: 3.6 mmol/L (ref 3.5–5.1)
SODIUM: 140 mmol/L (ref 135–145)
Total Bilirubin: 0.6 mg/dL (ref 0.3–1.2)
Total Protein: 6.2 g/dL — ABNORMAL LOW (ref 6.5–8.1)

## 2017-06-20 LAB — I-STAT CHEM 8, ED
BUN: 23 mg/dL — ABNORMAL HIGH (ref 6–20)
CHLORIDE: 100 mmol/L — AB (ref 101–111)
Calcium, Ion: 1.12 mmol/L — ABNORMAL LOW (ref 1.15–1.40)
Creatinine, Ser: 1.1 mg/dL — ABNORMAL HIGH (ref 0.44–1.00)
Glucose, Bld: 116 mg/dL — ABNORMAL HIGH (ref 65–99)
HEMATOCRIT: 29 % — AB (ref 36.0–46.0)
HEMOGLOBIN: 9.9 g/dL — AB (ref 12.0–15.0)
POTASSIUM: 4.3 mmol/L (ref 3.5–5.1)
SODIUM: 136 mmol/L (ref 135–145)
TCO2: 27 mmol/L (ref 0–100)

## 2017-06-20 LAB — URINALYSIS, ROUTINE W REFLEX MICROSCOPIC
BILIRUBIN URINE: NEGATIVE
Glucose, UA: NEGATIVE mg/dL
KETONES UR: NEGATIVE mg/dL
NITRITE: NEGATIVE
Protein, ur: NEGATIVE mg/dL
SPECIFIC GRAVITY, URINE: 1.014 (ref 1.005–1.030)
pH: 6 (ref 5.0–8.0)

## 2017-06-20 LAB — CBC
HEMATOCRIT: 32.2 % — AB (ref 36.0–46.0)
Hemoglobin: 10.6 g/dL — ABNORMAL LOW (ref 12.0–15.0)
MCH: 31.2 pg (ref 26.0–34.0)
MCHC: 32.9 g/dL (ref 30.0–36.0)
MCV: 94.7 fL (ref 78.0–100.0)
Platelets: 272 10*3/uL (ref 150–400)
RBC: 3.4 MIL/uL — ABNORMAL LOW (ref 3.87–5.11)
RDW: 15.9 % — AB (ref 11.5–15.5)
WBC: 13.3 10*3/uL — AB (ref 4.0–10.5)

## 2017-06-20 LAB — PROTIME-INR
INR: 0.96
Prothrombin Time: 12.7 seconds (ref 11.4–15.2)

## 2017-06-20 LAB — I-STAT CG4 LACTIC ACID, ED: LACTIC ACID, VENOUS: 2.86 mmol/L — AB (ref 0.5–1.9)

## 2017-06-20 LAB — ETHANOL: Alcohol, Ethyl (B): <5 mg/dL (ref <5)

## 2017-06-20 LAB — SAMPLE TO BLOOD BANK

## 2017-06-20 LAB — CDS SEROLOGY

## 2017-06-20 MED ORDER — ALBUTEROL SULFATE (2.5 MG/3ML) 0.083% IN NEBU
2.5000 mg | INHALATION_SOLUTION | Freq: Four times a day (QID) | RESPIRATORY_TRACT | Status: DC
  Administered 2017-06-20 – 2017-06-21 (×2): 2.5 mg via RESPIRATORY_TRACT
  Filled 2017-06-20 (×2): qty 3

## 2017-06-20 MED ORDER — METHOCARBAMOL 500 MG PO TABS
500.0000 mg | ORAL_TABLET | Freq: Four times a day (QID) | ORAL | Status: DC | PRN
  Administered 2017-06-20 – 2017-06-23 (×5): 500 mg via ORAL
  Filled 2017-06-20 (×5): qty 1

## 2017-06-20 MED ORDER — ATORVASTATIN CALCIUM 40 MG PO TABS
40.0000 mg | ORAL_TABLET | Freq: Every day | ORAL | Status: DC
  Administered 2017-06-21 – 2017-06-22 (×2): 40 mg via ORAL
  Filled 2017-06-20 (×2): qty 1

## 2017-06-20 MED ORDER — ASPIRIN EC 81 MG PO TBEC
81.0000 mg | DELAYED_RELEASE_TABLET | Freq: Every day | ORAL | Status: DC
  Administered 2017-06-21 – 2017-06-23 (×3): 81 mg via ORAL
  Filled 2017-06-20 (×3): qty 1

## 2017-06-20 MED ORDER — HYDROCODONE-ACETAMINOPHEN 5-325 MG PO TABS
1.0000 | ORAL_TABLET | ORAL | Status: DC | PRN
  Administered 2017-06-20 – 2017-06-22 (×5): 1 via ORAL
  Filled 2017-06-20 (×3): qty 1

## 2017-06-20 MED ORDER — FENTANYL CITRATE (PF) 100 MCG/2ML IJ SOLN
50.0000 ug | INTRAMUSCULAR | Status: DC | PRN
  Administered 2017-06-20: 50 ug via INTRAVENOUS
  Filled 2017-06-20: qty 2

## 2017-06-20 MED ORDER — KCL IN DEXTROSE-NACL 20-5-0.45 MEQ/L-%-% IV SOLN
INTRAVENOUS | Status: DC
  Administered 2017-06-20: 50 mL/h via INTRAVENOUS
  Administered 2017-06-21 – 2017-06-23 (×2): via INTRAVENOUS
  Filled 2017-06-20 (×4): qty 1000

## 2017-06-20 MED ORDER — ALPRAZOLAM 0.5 MG PO TABS
0.5000 mg | ORAL_TABLET | Freq: Two times a day (BID) | ORAL | Status: DC | PRN
  Administered 2017-06-20 – 2017-06-22 (×3): 0.5 mg via ORAL
  Filled 2017-06-20 (×3): qty 1

## 2017-06-20 MED ORDER — TETANUS-DIPHTH-ACELL PERTUSSIS 5-2.5-18.5 LF-MCG/0.5 IM SUSP
0.5000 mL | Freq: Once | INTRAMUSCULAR | Status: AC
  Administered 2017-06-20: 0.5 mL via INTRAMUSCULAR
  Filled 2017-06-20: qty 0.5

## 2017-06-20 MED ORDER — FLUTICASONE PROPIONATE 50 MCG/ACT NA SUSP
2.0000 | Freq: Every day | NASAL | Status: DC
  Administered 2017-06-20 – 2017-06-22 (×3): 2 via NASAL
  Filled 2017-06-20: qty 16

## 2017-06-20 MED ORDER — IOPAMIDOL (ISOVUE-300) INJECTION 61%
80.0000 mL | Freq: Once | INTRAVENOUS | Status: AC | PRN
  Administered 2017-06-20: 80 mL via INTRAVENOUS

## 2017-06-20 MED ORDER — TRAMADOL HCL 50 MG PO TABS
50.0000 mg | ORAL_TABLET | Freq: Four times a day (QID) | ORAL | Status: DC | PRN
  Administered 2017-06-21: 50 mg via ORAL
  Filled 2017-06-20: qty 1

## 2017-06-20 MED ORDER — PANTOPRAZOLE SODIUM 40 MG IV SOLR
40.0000 mg | Freq: Every day | INTRAVENOUS | Status: DC
Start: 2017-06-20 — End: 2017-06-22
  Administered 2017-06-20: 40 mg via INTRAVENOUS
  Filled 2017-06-20: qty 40

## 2017-06-20 MED ORDER — SODIUM CHLORIDE 0.9 % IV BOLUS (SEPSIS)
500.0000 mL | Freq: Once | INTRAVENOUS | Status: AC
  Administered 2017-06-20: 500 mL via INTRAVENOUS

## 2017-06-20 MED ORDER — FUROSEMIDE 20 MG PO TABS
20.0000 mg | ORAL_TABLET | Freq: Every day | ORAL | Status: DC
  Administered 2017-06-21 – 2017-06-23 (×3): 20 mg via ORAL
  Filled 2017-06-20 (×3): qty 1

## 2017-06-20 MED ORDER — PANTOPRAZOLE SODIUM 40 MG PO TBEC
40.0000 mg | DELAYED_RELEASE_TABLET | Freq: Every day | ORAL | Status: DC
  Administered 2017-06-21 – 2017-06-23 (×3): 40 mg via ORAL
  Filled 2017-06-20 (×3): qty 1

## 2017-06-20 MED ORDER — METOPROLOL SUCCINATE ER 100 MG PO TB24
100.0000 mg | ORAL_TABLET | Freq: Every day | ORAL | Status: DC
  Administered 2017-06-21 – 2017-06-23 (×3): 100 mg via ORAL
  Filled 2017-06-20 (×3): qty 1

## 2017-06-20 MED ORDER — NITROGLYCERIN 0.4 MG SL SUBL: 0.4000 mg | SUBLINGUAL_TABLET | SUBLINGUAL | Status: DC | PRN

## 2017-06-20 MED ORDER — ONDANSETRON 4 MG PO TBDP: 4.0000 mg | ORAL_TABLET | Freq: Four times a day (QID) | ORAL | Status: DC | PRN

## 2017-06-20 MED ORDER — INSULIN ASPART 100 UNIT/ML ~~LOC~~ SOLN
0.0000 [IU] | Freq: Three times a day (TID) | SUBCUTANEOUS | Status: DC
  Administered 2017-06-21 (×2): 2 [IU] via SUBCUTANEOUS
  Administered 2017-06-22: 3 [IU] via SUBCUTANEOUS

## 2017-06-20 MED ORDER — ALLOPURINOL 300 MG PO TABS
300.0000 mg | ORAL_TABLET | Freq: Every day | ORAL | Status: DC
  Administered 2017-06-21 – 2017-06-23 (×3): 300 mg via ORAL
  Filled 2017-06-20 (×3): qty 1

## 2017-06-20 MED ORDER — HYDROCODONE-ACETAMINOPHEN 5-325 MG PO TABS
2.0000 | ORAL_TABLET | ORAL | Status: DC | PRN
  Filled 2017-06-20 (×2): qty 2

## 2017-06-20 MED ORDER — LISINOPRIL 5 MG PO TABS
2.5000 mg | ORAL_TABLET | Freq: Every day | ORAL | Status: DC
  Administered 2017-06-21 – 2017-06-23 (×3): 2.5 mg via ORAL
  Filled 2017-06-20 (×3): qty 1

## 2017-06-20 MED ORDER — ONDANSETRON HCL 4 MG/2ML IJ SOLN: 4.0000 mg | Freq: Four times a day (QID) | INTRAMUSCULAR | Status: DC | PRN

## 2017-06-20 MED ORDER — INSULIN ASPART 100 UNIT/ML ~~LOC~~ SOLN: 0.0000 [IU] | Freq: Every day | SUBCUTANEOUS | Status: DC

## 2017-06-20 NOTE — ED Notes (Signed)
Patient in CT at this time.
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
## 2017-06-20 NOTE — Progress Notes (Signed)
Md on call patient is having muscle cramps in legs. New order received Arthor Captain LPN

## 2017-06-20 NOTE — ED Notes (Signed)
Purewik catheter placed.

## 2017-06-20 NOTE — ED Notes (Signed)
Patient offered something for pain, patient refused. Writer informed patient that if she needed something for pain to Biochemist, clinical know. Patient verbalized understanding.

## 2017-06-20 NOTE — ED Notes (Signed)
[facility] here for transport.  
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
## 2017-06-20 NOTE — ED Notes (Signed)
CRITICAL VALUE ALERT  Critical Value:  Potassium 6.4  Date & Time Notied:  06/20/2017 @ 1350  Provider Notified: Dr. Eulis Foster  Orders Received/Actions taken: EDP made aware
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
## 2017-06-20 NOTE — ED Notes (Signed)
Report given to [facility] 
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
## 2017-06-20 NOTE — ED Notes (Signed)
CRITICAL VALUE ALERT  Critical Value:  Lactic Acid 2.86  Date & Time Notied:  06/30/2017 @ 1350  Provider Notified: Dr. Eulis Foster made aware  Orders Received/Actions taken: MD made aware

## 2017-06-21 ENCOUNTER — Inpatient Hospital Stay (HOSPITAL_COMMUNITY): Payer: No Typology Code available for payment source

## 2017-06-21 LAB — CBC
HEMATOCRIT: 25.9 % — AB (ref 36.0–46.0)
HEMATOCRIT: 26.6 % — AB (ref 36.0–46.0)
HEMOGLOBIN: 8.8 g/dL — AB (ref 12.0–15.0)
Hemoglobin: 8.7 g/dL — ABNORMAL LOW (ref 12.0–15.0)
MCH: 31.1 pg (ref 26.0–34.0)
MCH: 29.8 pg (ref 26.0–34.0)
MCHC: 34 g/dL (ref 30.0–36.0)
MCHC: 32.7 g/dL (ref 30.0–36.0)
MCV: 91.5 fL (ref 78.0–100.0)
MCV: 91.1 fL (ref 78.0–100.0)
Platelets: 252 10*3/uL (ref 150–400)
Platelets: 241 10*3/uL (ref 150–400)
RBC: 2.83 MIL/uL — ABNORMAL LOW (ref 3.87–5.11)
RBC: 2.92 MIL/uL — AB (ref 3.87–5.11)
RDW: 16.1 % — ABNORMAL HIGH (ref 11.5–15.5)
RDW: 15.7 % — ABNORMAL HIGH (ref 11.5–15.5)
WBC: 8.6 10*3/uL (ref 4.0–10.5)
WBC: 6.5 10*3/uL (ref 4.0–10.5)

## 2017-06-21 LAB — BASIC METABOLIC PANEL
ANION GAP: 10 (ref 5–15)
BUN: 20 mg/dL (ref 6–20)
CHLORIDE: 101 mmol/L (ref 101–111)
CO2: 24 mmol/L (ref 22–32)
CREATININE: 1.32 mg/dL — AB (ref 0.44–1.00)
Calcium: 8.5 mg/dL — ABNORMAL LOW (ref 8.9–10.3)
GFR calc non Af Amer: 38 mL/min — ABNORMAL LOW (ref >60)
GFR, EST AFRICAN AMERICAN: 44 mL/min — AB (ref >60)
Glucose, Bld: 127 mg/dL — ABNORMAL HIGH (ref 65–99)
Potassium: 4.2 mmol/L (ref 3.5–5.1)
Sodium: 135 mmol/L (ref 135–145)

## 2017-06-21 LAB — GLUCOSE, CAPILLARY
GLUCOSE-CAPILLARY: 152 mg/dL — AB (ref 65–99)
Glucose-Capillary: 137 mg/dL — ABNORMAL HIGH (ref 65–99)
Glucose-Capillary: 130 mg/dL — ABNORMAL HIGH (ref 65–99)
Glucose-Capillary: 104 mg/dL — ABNORMAL HIGH (ref 65–99)

## 2017-06-21 MED ORDER — ALBUTEROL SULFATE (2.5 MG/3ML) 0.083% IN NEBU: 2.5000 mg | INHALATION_SOLUTION | Freq: Four times a day (QID) | RESPIRATORY_TRACT | Status: DC | PRN
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
## 2017-06-22 ENCOUNTER — Inpatient Hospital Stay (HOSPITAL_COMMUNITY): Payer: No Typology Code available for payment source

## 2017-06-22 LAB — GLUCOSE, CAPILLARY
GLUCOSE-CAPILLARY: 160 mg/dL — AB (ref 65–99)
GLUCOSE-CAPILLARY: 160 mg/dL — AB (ref 65–99)
Glucose-Capillary: 117 mg/dL — ABNORMAL HIGH (ref 65–99)
Glucose-Capillary: 107 mg/dL — ABNORMAL HIGH (ref 65–99)

## 2017-06-22 MED ORDER — SODIUM CHLORIDE 0.9 % IV BOLUS (SEPSIS)
250.0000 mL | Freq: Once | INTRAVENOUS | Status: AC
  Administered 2017-06-22: 250 mL via INTRAVENOUS
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
## 2017-06-22 NOTE — Progress Notes (Signed)
MD paged due to patient's blood pressure being 93/53. Awaiting a return call. Will continue to monitor.

## 2017-06-23 LAB — GLUCOSE, CAPILLARY
GLUCOSE-CAPILLARY: 128 mg/dL — AB (ref 65–99)
Glucose-Capillary: 126 mg/dL — ABNORMAL HIGH (ref 65–99)

## 2017-06-23 LAB — I-STAT CHEM 8, ED
BUN: 28 mg/dL — ABNORMAL HIGH (ref 6–20)
CREATININE: 1.3 mg/dL — AB (ref 0.44–1.00)
Calcium, Ion: 0.9 mmol/L — ABNORMAL LOW (ref 1.15–1.40)
Chloride: 105 mmol/L (ref 101–111)
Glucose, Bld: 145 mg/dL — ABNORMAL HIGH (ref 65–99)
HEMATOCRIT: 34 % — AB (ref 36.0–46.0)
HEMOGLOBIN: 11.6 g/dL — AB (ref 12.0–15.0)
POTASSIUM: 6.4 mmol/L — AB (ref 3.5–5.1)
SODIUM: 135 mmol/L (ref 135–145)
TCO2: 25 mmol/L (ref 0–100)

## 2017-06-23 MED ORDER — METHOCARBAMOL 500 MG PO TABS: 500.0000 mg | ORAL_TABLET | Freq: Three times a day (TID) | ORAL | 0 refills | Status: DC | PRN
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
## 2017-06-23 NOTE — Progress Notes (Signed)
Pt discharged home after working with physical therapy this afternoon. Pt has walker at home. Robaxin script given to patient. AVS reviewed with and signed by patient. All questions answered. Blood pressure (!) 119/47, pulse 83, temperature 97.8 F (36.6 C), temperature source Oral, resp. rate 18, height 5\' 4"  (1.626 m), weight 62.6 kg (138 lb), SpO2 97 %.
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
## 2017-06-24 ENCOUNTER — Telehealth: Payer: Self-pay | Admitting: Family Medicine

## 2017-06-24 NOTE — Consult Note (Signed)
           [facility] CM Primary Care Navigator  06/24/2017  Elizabeth Aguilar [birth year] [id number]   Went to seepatient at the bedsideearlier today to identify possible discharge needs but she was discharged per staff report. Patient was discharged home with home health services yesterday.  Patient has discharge instruction to see primary care provider in a week. Primary care provider's officeis listed as doing transition of care.    For questions, please contact:  Dannielle Huh, BSN, RN- [facility] Primary Care Navigator  Telephone: [phone] [place]

## 2017-06-24 NOTE — Telephone Encounter (Signed)
Transitional care-just d/c'd from hosp- rec keep ov for Friday-  Tell pt to bring all meds- identify if any issues-please document

## 2017-06-25 NOTE — Telephone Encounter (Signed)
Patient advised to keep her appointment Friday with Dr Nicki Reaper and bring all her meds to her visit. Patient verbalized understanding.

## 2017-06-27 ENCOUNTER — Ambulatory Visit (INDEPENDENT_AMBULATORY_CARE_PROVIDER_SITE_OTHER): Payer: Medicare Other | Admitting: Family Medicine

## 2017-06-27 VITALS — BP 108/70 | Ht 64.0 in | Wt 143.0 lb

## 2017-06-27 DIAGNOSIS — E279 Disorder of adrenal gland, unspecified: Secondary | ICD-10-CM | POA: Diagnosis not present

## 2017-06-27 DIAGNOSIS — I10 Essential (primary) hypertension: Secondary | ICD-10-CM

## 2017-06-27 DIAGNOSIS — M109 Gout, unspecified: Secondary | ICD-10-CM | POA: Diagnosis not present

## 2017-06-27 DIAGNOSIS — Z79891 Long term (current) use of opiate analgesic: Secondary | ICD-10-CM | POA: Diagnosis not present

## 2017-06-27 DIAGNOSIS — D649 Anemia, unspecified: Secondary | ICD-10-CM

## 2017-06-27 DIAGNOSIS — R071 Chest pain on breathing: Secondary | ICD-10-CM | POA: Diagnosis not present

## 2017-06-27 DIAGNOSIS — T148XXA Other injury of unspecified body region, initial encounter: Secondary | ICD-10-CM

## 2017-06-27 DIAGNOSIS — E119 Type 2 diabetes mellitus without complications: Secondary | ICD-10-CM | POA: Diagnosis not present

## 2017-06-27 DIAGNOSIS — E278 Other specified disorders of adrenal gland: Secondary | ICD-10-CM

## 2017-06-27 LAB — POCT GLYCOSYLATED HEMOGLOBIN (HGB A1C): HEMOGLOBIN A1C: 4.8

## 2017-06-27 LAB — POCT HEMOGLOBIN: HEMOGLOBIN: 11.2 g/dL — AB (ref 12.2–16.2)

## 2017-06-27 MED ORDER — HYDROCODONE-ACETAMINOPHEN 5-325 MG PO TABS: 1.0000 | ORAL_TABLET | Freq: Four times a day (QID) | ORAL | 0 refills | Status: DC | PRN
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
## 2017-06-30 ENCOUNTER — Encounter: Payer: Self-pay | Admitting: Student

## 2017-06-30 ENCOUNTER — Ambulatory Visit (INDEPENDENT_AMBULATORY_CARE_PROVIDER_SITE_OTHER): Payer: Medicare Other | Admitting: Student

## 2017-06-30 VITALS — BP 118/66 | HR 70 | Ht 63.0 in | Wt 144.0 lb

## 2017-06-30 DIAGNOSIS — I2510 Atherosclerotic heart disease of native coronary artery without angina pectoris: Secondary | ICD-10-CM

## 2017-06-30 DIAGNOSIS — I10 Essential (primary) hypertension: Secondary | ICD-10-CM | POA: Diagnosis not present

## 2017-06-30 DIAGNOSIS — I5042 Chronic combined systolic (congestive) and diastolic (congestive) heart failure: Secondary | ICD-10-CM | POA: Diagnosis not present

## 2017-06-30 DIAGNOSIS — S301XXD Contusion of abdominal wall, subsequent encounter: Secondary | ICD-10-CM

## 2017-06-30 DIAGNOSIS — E785 Hyperlipidemia, unspecified: Secondary | ICD-10-CM

## 2017-06-30 MED ORDER — METOPROLOL SUCCINATE ER 100 MG PO TB24: ORAL_TABLET | ORAL | 1 refills | Status: DC

## 2017-06-30 MED ORDER — FUROSEMIDE 20 MG PO TABS: 20.0000 mg | ORAL_TABLET | Freq: Every day | ORAL | 1 refills | Status: DC

## 2017-06-30 MED ORDER — ATORVASTATIN CALCIUM 40 MG PO TABS: ORAL_TABLET | ORAL | 1 refills | Status: DC

## 2017-06-30 MED ORDER — LISINOPRIL 2.5 MG PO TABS: 2.5000 mg | ORAL_TABLET | Freq: Every day | ORAL | 1 refills | Status: DC

## 2017-06-30 NOTE — Patient Instructions (Signed)
Medication Instructions:  Your physician recommends that you continue on your current medications as directed. Please refer to the Current Medication list given to you today.  If you need a refill on your cardiac medications before your next appointment, please call your pharmacy.  Follow-Up: Your physician wants you to follow-up in: 3 months with dr Claiborne Billings   Special Instructions: DO NOT RESUME BRILINTA   Thank you for choosing [facility] at [facility]!!

## 2017-07-03 LAB — TOXASSURE SELECT 13 (MW), URINE

## 2017-07-04 ENCOUNTER — Telehealth: Payer: Self-pay | Admitting: Family Medicine

## 2017-07-04 NOTE — Telephone Encounter (Signed)
Patient is requesting handicap placard be filled out. Please see form in yellow folder.

## 2017-07-06 ENCOUNTER — Other Ambulatory Visit: Payer: Self-pay | Admitting: Cardiovascular Disease

## 2017-07-06 ENCOUNTER — Other Ambulatory Visit: Payer: Self-pay | Admitting: Family Medicine

## 2017-07-07 ENCOUNTER — Other Ambulatory Visit: Payer: Self-pay | Admitting: Family Medicine

## 2017-07-07 NOTE — Telephone Encounter (Signed)
REFILL 

## 2017-07-07 NOTE — Telephone Encounter (Signed)
This was completed

## 2017-07-07 NOTE — Telephone Encounter (Signed)
May have this +3 refills-caution drowsiness
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
## 2017-07-29 ENCOUNTER — Ambulatory Visit: Payer: Medicare Other | Admitting: Rheumatology

## 2017-07-31 ENCOUNTER — Encounter: Payer: Self-pay | Admitting: Rheumatology

## 2017-07-31 ENCOUNTER — Ambulatory Visit (INDEPENDENT_AMBULATORY_CARE_PROVIDER_SITE_OTHER): Payer: Medicare Other | Admitting: Rheumatology

## 2017-07-31 VITALS — BP 124/72 | HR 78 | Resp 14 | Ht 63.0 in | Wt 149.0 lb

## 2017-07-31 DIAGNOSIS — M160 Bilateral primary osteoarthritis of hip: Secondary | ICD-10-CM

## 2017-07-31 DIAGNOSIS — M5136 Other intervertebral disc degeneration, lumbar region: Secondary | ICD-10-CM

## 2017-07-31 DIAGNOSIS — Z79899 Other long term (current) drug therapy: Secondary | ICD-10-CM | POA: Diagnosis not present

## 2017-07-31 DIAGNOSIS — Z8739 Personal history of other diseases of the musculoskeletal system and connective tissue: Secondary | ICD-10-CM | POA: Diagnosis not present

## 2017-07-31 DIAGNOSIS — M19041 Primary osteoarthritis, right hand: Secondary | ICD-10-CM

## 2017-07-31 DIAGNOSIS — Z8679 Personal history of other diseases of the circulatory system: Secondary | ICD-10-CM | POA: Diagnosis not present

## 2017-07-31 DIAGNOSIS — M109 Gout, unspecified: Secondary | ICD-10-CM

## 2017-07-31 DIAGNOSIS — M170 Bilateral primary osteoarthritis of knee: Secondary | ICD-10-CM | POA: Diagnosis not present

## 2017-07-31 DIAGNOSIS — Z8639 Personal history of other endocrine, nutritional and metabolic disease: Secondary | ICD-10-CM

## 2017-07-31 DIAGNOSIS — M19071 Primary osteoarthritis, right ankle and foot: Secondary | ICD-10-CM | POA: Diagnosis not present

## 2017-07-31 DIAGNOSIS — Z8719 Personal history of other diseases of the digestive system: Secondary | ICD-10-CM | POA: Diagnosis not present

## 2017-07-31 DIAGNOSIS — M19072 Primary osteoarthritis, left ankle and foot: Secondary | ICD-10-CM

## 2017-07-31 DIAGNOSIS — I5030 Unspecified diastolic (congestive) heart failure: Secondary | ICD-10-CM

## 2017-07-31 DIAGNOSIS — M51369 Other intervertebral disc degeneration, lumbar region without mention of lumbar back pain or lower extremity pain: Secondary | ICD-10-CM

## 2017-07-31 DIAGNOSIS — I2510 Atherosclerotic heart disease of native coronary artery without angina pectoris: Secondary | ICD-10-CM

## 2017-07-31 DIAGNOSIS — M19042 Primary osteoarthritis, left hand: Secondary | ICD-10-CM

## 2017-07-31 LAB — COMPLETE METABOLIC PANEL WITH GFR
AG Ratio: 1.4 (calc) (ref 1.0–2.5)
ALT: 11 U/L (ref 6–29)
AST: 14 U/L (ref 10–35)
Albumin: 3.9 g/dL (ref 3.6–5.1)
Alkaline phosphatase (APISO): 183 U/L — ABNORMAL HIGH (ref 33–130)
BILIRUBIN TOTAL: 0.3 mg/dL (ref 0.2–1.2)
BUN/Creatinine Ratio: 15 (calc) (ref 6–22)
BUN: 16 mg/dL (ref 7–25)
CHLORIDE: 105 mmol/L (ref 98–110)
CO2: 29 mmol/L (ref 20–32)
Calcium: 9.4 mg/dL (ref 8.6–10.4)
Creat: 1.07 mg/dL — ABNORMAL HIGH (ref 0.60–0.93)
GFR, EST AFRICAN AMERICAN: 58 mL/min/{1.73_m2} — AB (ref >=60)
GFR, Est Non African American: 50 mL/min/{1.73_m2} — ABNORMAL LOW (ref >=60)
GLUCOSE: 102 mg/dL — AB (ref 65–99)
Globulin: 2.7 g/dL (calc) (ref 1.9–3.7)
POTASSIUM: 4.1 mmol/L (ref 3.5–5.3)
Sodium: 142 mmol/L (ref 135–146)
TOTAL PROTEIN: 6.6 g/dL (ref 6.1–8.1)

## 2017-07-31 LAB — URIC ACID: URIC ACID, SERUM: 3.9 mg/dL (ref 2.5–7.0)

## 2017-07-31 LAB — CBC WITH DIFFERENTIAL/PLATELET
BASOS PCT: 0.5 %
Basophils Absolute: 48 cells/uL (ref 0–200)
EOS ABS: 250 {cells}/uL (ref 15–500)
Eosinophils Relative: 2.6 %
HCT: 33.2 % — ABNORMAL LOW (ref 35.0–45.0)
Hemoglobin: 11 g/dL — ABNORMAL LOW (ref 11.7–15.5)
Lymphs Abs: 2486 cells/uL (ref 850–3900)
MCH: 31.3 pg (ref 27.0–33.0)
MCHC: 33.1 g/dL (ref 32.0–36.0)
MCV: 94.3 fL (ref 80.0–100.0)
MONOS PCT: 6 %
MPV: 11.8 fL (ref 7.5–12.5)
Neutro Abs: 6240 cells/uL (ref 1500–7800)
Neutrophils Relative %: 65 %
Platelets: 325 10*3/uL (ref 140–400)
RBC: 3.52 10*6/uL — AB (ref 3.80–5.10)
RDW: 15.2 % — ABNORMAL HIGH (ref 11.0–15.0)
TOTAL LYMPHOCYTE: 25.9 %
WBC mixed population: 576 cells/uL (ref 200–950)
WBC: 9.6 10*3/uL (ref 3.8–10.8)

## 2017-08-01 ENCOUNTER — Telehealth: Payer: Self-pay | Admitting: Radiology

## 2017-08-01 NOTE — Telephone Encounter (Signed)
I have called patient to advise of her  labs

## 2017-08-01 NOTE — Progress Notes (Signed)
Please, ask Pt. To take VIt D if she is not taking. Will follow alk phos for now.

## 2017-08-01 NOTE — Telephone Encounter (Signed)
-----  Message from Bo Merino, MD sent at 08/01/2017  1:40 PM EDT ----- Please, ask Pt. To take VIt D if she is not taking. Will follow alk phos for now.

## 2017-08-05 ENCOUNTER — Other Ambulatory Visit: Payer: Self-pay | Admitting: Family Medicine

## 2017-08-06 ENCOUNTER — Telehealth: Payer: Self-pay | Admitting: Family Medicine

## 2017-08-06 MED ORDER — NITROFURANTOIN MONOHYD MACRO 100 MG PO CAPS: 100.0000 mg | ORAL_CAPSULE | Freq: Two times a day (BID) | ORAL | 0 refills | Status: DC

## 2017-08-06 NOTE — Telephone Encounter (Signed)
Prescription sent electronically to pharmacy. Patient notified. 

## 2017-08-06 NOTE — Telephone Encounter (Signed)
Ok numb 14 one bid seven d

## 2017-08-06 NOTE — Telephone Encounter (Signed)
Patient states she has another flare-up with UTI and wanting  Nitrofurantoin 100 mg called into the drug store in [place].

## 2017-08-06 NOTE — Telephone Encounter (Signed)
Last seen 05/2017 with UTI

## 2017-09-01 ENCOUNTER — Other Ambulatory Visit: Payer: Self-pay | Admitting: Cardiovascular Disease

## 2017-09-02 NOTE — Telephone Encounter (Signed)
REFILL 

## 2017-09-03 ENCOUNTER — Other Ambulatory Visit: Payer: Self-pay | Admitting: Family Medicine

## 2017-09-04 ENCOUNTER — Other Ambulatory Visit: Payer: Self-pay | Admitting: Family Medicine

## 2017-09-04 NOTE — Telephone Encounter (Signed)
There is +3 refills

## 2017-09-14 DIAGNOSIS — Z23 Encounter for immunization: Secondary | ICD-10-CM | POA: Diagnosis not present

## 2017-09-30 ENCOUNTER — Encounter: Payer: Self-pay | Admitting: Family Medicine

## 2017-09-30 ENCOUNTER — Ambulatory Visit (INDEPENDENT_AMBULATORY_CARE_PROVIDER_SITE_OTHER): Payer: Medicare Other | Admitting: Family Medicine

## 2017-09-30 VITALS — BP 124/72 | Ht 64.0 in | Wt 150.0 lb

## 2017-09-30 DIAGNOSIS — S0083XA Contusion of other part of head, initial encounter: Secondary | ICD-10-CM

## 2017-09-30 DIAGNOSIS — I10 Essential (primary) hypertension: Secondary | ICD-10-CM | POA: Diagnosis not present

## 2017-09-30 DIAGNOSIS — M549 Dorsalgia, unspecified: Secondary | ICD-10-CM

## 2017-09-30 DIAGNOSIS — M1990 Unspecified osteoarthritis, unspecified site: Secondary | ICD-10-CM | POA: Diagnosis not present

## 2017-09-30 DIAGNOSIS — M109 Gout, unspecified: Secondary | ICD-10-CM

## 2017-09-30 DIAGNOSIS — I2510 Atherosclerotic heart disease of native coronary artery without angina pectoris: Secondary | ICD-10-CM | POA: Diagnosis not present

## 2017-09-30 DIAGNOSIS — G8929 Other chronic pain: Secondary | ICD-10-CM | POA: Diagnosis not present

## 2017-09-30 DIAGNOSIS — E7849 Other hyperlipidemia: Secondary | ICD-10-CM | POA: Diagnosis not present

## 2017-09-30 DIAGNOSIS — M5489 Other dorsalgia: Secondary | ICD-10-CM

## 2017-09-30 MED ORDER — HYDROCODONE-ACETAMINOPHEN 5-325 MG PO TABS: 1.0000 | ORAL_TABLET | Freq: Four times a day (QID) | ORAL | 0 refills | Status: DC | PRN

## 2017-09-30 MED ORDER — ATORVASTATIN CALCIUM 40 MG PO TABS: ORAL_TABLET | ORAL | 1 refills | Status: DC

## 2017-09-30 NOTE — Progress Notes (Signed)
Subjective:     Patient ID: Elizabeth Aguilar, female   DOB: [birth year], 76 y.o.   MRN: [id number]  HPI This patient was seen today for chronic pain  The medication list was reviewed and updated.   -Compliance with medication: Good compliance  - Number patient states they take daily: Maximum 4/day  -when was the last dose patient took?  Earlier today  The patient was advised the importance of maintaining medication and not using illegal substances with these.  Refills needed: Does need refills  The patient was educated that we can provide 3 monthly scripts for their medication, it is their responsibility to follow the instructions.  Side effects or complications from medications: Denies side effects  Patient is aware that pain medications are meant to minimize the severity of the pain to allow their pain levels to improve to allow for better function. They are aware of that pain medications cannot totally remove their pain.  Due for UDT ( at least once per year) : Does this on a yearly basis not due today       Review of Systems  Constitutional: Negative for activity change and appetite change.  HENT: Negative for congestion.   Respiratory: Negative for cough.   Cardiovascular: Negative for chest pain.  Gastrointestinal: Negative for abdominal pain and vomiting.  Musculoskeletal: Positive for arthralgias and back pain.  Skin: Negative for color change.  Neurological: Negative for weakness.  Psychiatric/Behavioral: Negative for confusion.       Objective:   Physical Exam  Constitutional: She appears well-nourished. No distress.  HENT:  Head: Normocephalic.  Right Ear: External ear normal.  Left Ear: External ear normal.  Eyes: Right eye exhibits no discharge. Left eye exhibits no discharge.  Neck: No tracheal deviation present.  Cardiovascular: Normal rate, regular rhythm and normal heart sounds.  No murmur heard. Pulmonary/Chest: Effort normal and breath sounds  normal. No respiratory distress. She has no wheezes. She has no rales.  Musculoskeletal: She exhibits no edema.  Lymphadenopathy:    She has no cervical adenopathy.  Neurological: She is alert.  Psychiatric: Her behavior is normal.  Vitals reviewed.  25 minutes was spent with the patient. Greater than half the time was spent in discussion and answering questions and counseling regarding the issues that the patient came in for today.     Assessment:     Chronic pain Gout arthritis Hypertension Head contusion initial consult    Plan:    The patient was seen today as part of a comprehensive visit regarding pain control. Patient's compliance with the medication as well as discussion regarding effectiveness was completed. Prescriptions were written. Patient was advised to follow-up in 3 months. The patient was assessed for any signs of severe side effects. The patient was advised to take the medicine as directed and to report to [place] if any side effect issues.  The patient is not commenced that she has gout arthritis.  She does not want to go back to see the arthritis doctor at this time she wants to stop allopurinol she states that is causing side effects she wants to recheck her uric acid level in 3 months  Chronic back pain discomfort pain medicine as necessary  HTN good control continue current measures  Head contusion initial encounter no LOC no CAT scan indicated will gradually get better warning signs discussed

## 2017-10-03 ENCOUNTER — Other Ambulatory Visit: Payer: Self-pay | Admitting: Family Medicine

## 2017-10-14 ENCOUNTER — Encounter: Payer: Self-pay | Admitting: Cardiovascular Disease

## 2017-10-14 ENCOUNTER — Ambulatory Visit (INDEPENDENT_AMBULATORY_CARE_PROVIDER_SITE_OTHER): Payer: Medicare Other | Admitting: Cardiovascular Disease

## 2017-10-14 VITALS — BP 126/80 | HR 77 | Ht 64.0 in | Wt 149.0 lb

## 2017-10-14 DIAGNOSIS — I5042 Chronic combined systolic (congestive) and diastolic (congestive) heart failure: Secondary | ICD-10-CM

## 2017-10-14 DIAGNOSIS — K219 Gastro-esophageal reflux disease without esophagitis: Secondary | ICD-10-CM | POA: Diagnosis not present

## 2017-10-14 DIAGNOSIS — I2109 ST elevation (STEMI) myocardial infarction involving other coronary artery of anterior wall: Secondary | ICD-10-CM

## 2017-10-14 DIAGNOSIS — I10 Essential (primary) hypertension: Secondary | ICD-10-CM | POA: Diagnosis not present

## 2017-10-14 DIAGNOSIS — I2510 Atherosclerotic heart disease of native coronary artery without angina pectoris: Secondary | ICD-10-CM

## 2017-10-14 DIAGNOSIS — I2102 ST elevation (STEMI) myocardial infarction involving left anterior descending coronary artery: Secondary | ICD-10-CM

## 2017-10-14 DIAGNOSIS — E785 Hyperlipidemia, unspecified: Secondary | ICD-10-CM

## 2017-10-14 DIAGNOSIS — E1121 Type 2 diabetes mellitus with diabetic nephropathy: Secondary | ICD-10-CM | POA: Diagnosis not present
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
## 2017-10-14 NOTE — Patient Instructions (Signed)
Medication Instructions:  Your physician recommends that you continue on your current medications as directed. Please refer to the Current Medication list given to you today.  Follow-Up: Your physician wants you to follow-up in: 12 months with Dr. Kelly.  You will receive a reminder letter in the mail two months in advance. If you don't receive a letter, please call our office to schedule the follow-up appointment.   If you need a refill on your cardiac medications before your next appointment, please call your pharmacy.   

## 2017-10-16 ENCOUNTER — Encounter: Payer: Self-pay | Admitting: Cardiovascular Disease

## 2017-10-16 ENCOUNTER — Other Ambulatory Visit: Payer: Self-pay

## 2017-11-02 ENCOUNTER — Other Ambulatory Visit: Payer: Self-pay | Admitting: Family Medicine

## 2017-11-06 ENCOUNTER — Telehealth: Payer: Self-pay | Admitting: Family Medicine

## 2017-11-06 DIAGNOSIS — M109 Gout, unspecified: Secondary | ICD-10-CM

## 2017-11-06 DIAGNOSIS — M1990 Unspecified osteoarthritis, unspecified site: Secondary | ICD-10-CM

## 2017-11-06 NOTE — Telephone Encounter (Signed)
[facility] 12/27

## 2017-11-06 NOTE — Telephone Encounter (Signed)
Patient having pain from arthritis in hands and knees- had hydrocodone but can only take every 6 hours and wears off before then

## 2017-11-06 NOTE — Telephone Encounter (Signed)
Pt is requesting something to be called in for the arthritis in her hands and knees. Pt states that she has the pain medication but that she is not taking it every 3 to 4 hours. Please advise.

## 2017-11-06 NOTE — Telephone Encounter (Signed)
Several questions #1 any swelling redness within the joints?  #2 is patient taking Celebrex daily as her medicine list states?  #3 when the patient does take her hydrocodone does it seem to help for a while if so for how long?

## 2017-11-10 NOTE — Telephone Encounter (Signed)
Pt is aware of all and referral put in for a new rheumatologist.

## 2017-11-10 NOTE — Telephone Encounter (Signed)
There is no additional medications I can recommend for her.  Stay with her Celebrex daily and also hydrocodone up to 4 times a day.  She should keep her regular pain management visits with [place].  She should consider a visit with rheumatology if she would like for [place] to set her up with a different rheumatology group for her to let us know

## 2017-11-10 NOTE — Telephone Encounter (Signed)
Pt states yes her joints are red and swollen,yes she takes celebrex Q day as med list suggests. She takes the hydrocodone 6am,12noon,then before bed Q night. She state if going out she will not take any medication until she gets home and is staying there. She says the medication usually lasts around four hours.

## 2017-11-19 ENCOUNTER — Encounter: Payer: Self-pay | Admitting: Family Medicine

## 2017-11-27 ENCOUNTER — Encounter: Payer: Self-pay | Admitting: Family Medicine

## 2017-11-27 ENCOUNTER — Ambulatory Visit (INDEPENDENT_AMBULATORY_CARE_PROVIDER_SITE_OTHER): Payer: Medicare Other | Admitting: Family Medicine

## 2017-11-27 VITALS — Temp 97.7°F | Ht 64.0 in | Wt 152.8 lb

## 2017-11-27 DIAGNOSIS — J019 Acute sinusitis, unspecified: Secondary | ICD-10-CM

## 2017-11-27 DIAGNOSIS — J300 Vasomotor rhinitis: Secondary | ICD-10-CM

## 2017-11-27 MED ORDER — CLINDAMYCIN HCL 300 MG PO CAPS: 300.0000 mg | ORAL_CAPSULE | Freq: Three times a day (TID) | ORAL | 0 refills | Status: DC

## 2017-11-27 MED ORDER — IPRATROPIUM BROMIDE 0.06 % NA SOLN: 2.0000 | Freq: Four times a day (QID) | NASAL | 12 refills | Status: DC

## 2017-11-27 NOTE — Progress Notes (Signed)
   Subjective:    Patient ID: Elizabeth Aguilar, female    DOB: 1941-10-29, 77 y.o.   MRN: [id number]  Sinusitis  This is a new problem. The current episode started 1 to 4 weeks ago. The problem has been gradually worsening since onset. There has been no fever. Her pain is at a severity of 7/10. The pain is moderate. Associated symptoms include congestion, coughing, headaches, sinus pressure, sneezing and a sore throat. Pertinent negatives include no diaphoresis, ear pain or shortness of breath. Past treatments include nothing. The treatment provided no relief.      Review of Systems  Constitutional: Negative for activity change, diaphoresis and fever.  HENT: Positive for congestion, rhinorrhea, sinus pressure, sneezing and sore throat. Negative for ear pain.   Eyes: Negative for discharge.  Respiratory: Positive for cough. Negative for shortness of breath and wheezing.   Cardiovascular: Negative for chest pain.  Neurological: Positive for headaches.       Objective:   Physical Exam  Constitutional: She appears well-developed.  HENT:  Head: Normocephalic.  Right Ear: External ear normal.  Left Ear: External ear normal.  Nose: Nose normal.  Mouth/Throat: Oropharynx is clear and moist. No oropharyngeal exudate.  Eyes: Right eye exhibits no discharge. Left eye exhibits no discharge.  Neck: Neck supple. No tracheal deviation present.  Cardiovascular: Normal rate and normal heart sounds.  No murmur heard. Pulmonary/Chest: Effort normal and breath sounds normal. She has no wheezes. She has no rales.  Lymphadenopathy:    She has no cervical adenopathy.  Skin: Skin is warm and dry.  Nursing note and vitals reviewed.   Neurologically appears normal She does complain of clear runny nose which happens on a regular basis over the past several months could easily be related to vasomotor rhinitis Flonase does not help this she will try Atrovent nasal spray     Assessment & Plan:  Patient  was seen today for upper respiratory illness. It is felt that the patient is dealing with sinusitis. Antibiotics were prescribed today. Importance of compliance with medication was discussed. Symptoms should gradually resolve over the course of the next several days. If high fevers, progressive illness, difficulty breathing, worsening condition or failure for symptoms to improve over the next several days then the patient is to follow-up. If any emergent conditions the patient is to follow-up in the emergency department otherwise to follow-up in the office.   Patient relates occasional headaches.  She did have a fall a while back no loss of consciousness.  She is not vomiting with these headaches she is going to keep track of these headaches she will let us know how they are doing and she will discuss them further on her follow-up visit within the next month

## 2017-12-15 ENCOUNTER — Ambulatory Visit (INDEPENDENT_AMBULATORY_CARE_PROVIDER_SITE_OTHER): Payer: Medicare Other | Admitting: Family Medicine

## 2017-12-15 ENCOUNTER — Encounter: Payer: Self-pay | Admitting: Family Medicine

## 2017-12-15 VITALS — BP 132/86 | Temp 98.4°F | Ht 64.0 in | Wt 152.4 lb

## 2017-12-15 DIAGNOSIS — J019 Acute sinusitis, unspecified: Secondary | ICD-10-CM | POA: Diagnosis not present

## 2017-12-15 DIAGNOSIS — J4521 Mild intermittent asthma with (acute) exacerbation: Secondary | ICD-10-CM

## 2017-12-15 MED ORDER — PREDNISONE 20 MG PO TABS: ORAL_TABLET | ORAL | 0 refills | Status: DC

## 2017-12-15 MED ORDER — CEFDINIR 300 MG PO CAPS: 300.0000 mg | ORAL_CAPSULE | Freq: Two times a day (BID) | ORAL | 0 refills | Status: DC

## 2017-12-15 NOTE — Progress Notes (Signed)
   Subjective:    Patient ID: Elizabeth Aguilar, female    DOB: Feb 06, 1941, 77 y.o.   MRN: [id number]  Cough  This is a new problem. The current episode started in the past 7 days. Associated symptoms include headaches, myalgias, nasal congestion, a sore throat and wheezing. Treatments tried: tylenol.   pts sone felt bbad nd got ral sick,  And had sinux infxn  Pt felt bad last mid wk, got to feeling worse by thur  Very bad cough, productive at times  Sore throat , achey , . Worse lat e in the afternoon  Energy low  Got the senior flu shot, son had somewhat similar episode  Got th eeden drugs     Review of Systems  HENT: Positive for sore throat.   Respiratory: Positive for cough and wheezing.   Musculoskeletal: Positive for myalgias.  Neurological: Positive for headaches.       Objective:   Physical Exam Alert active mild malaise no acute distress HEENT some nasal congestion pharynx slight erythema lungs bilateral wheezes no tachypnea no inspiratory crackles intermittent bronchial cough       Assessment & Plan:  Impression post viral bronchitis with exacerbation prednisone taper medium dose, antibiotics prescribed.  Encouraged to use albuterol faithfully warning signs discussed

## 2017-12-18 ENCOUNTER — Other Ambulatory Visit: Payer: Self-pay

## 2017-12-18 ENCOUNTER — Emergency Department (HOSPITAL_COMMUNITY)
Admission: EM | Admit: 2017-12-18 | Discharge: 2017-12-19 | Disposition: A | Discharge status: Home / Self Care | Payer: Medicare Other | Attending: Emergency Medicine | Admitting: Emergency Medicine

## 2017-12-18 ENCOUNTER — Emergency Department (HOSPITAL_COMMUNITY): Payer: Medicare Other

## 2017-12-18 ENCOUNTER — Encounter (HOSPITAL_COMMUNITY): Payer: Self-pay | Admitting: Emergency Medicine

## 2017-12-18 ENCOUNTER — Telehealth: Payer: Self-pay | Admitting: Family Medicine

## 2017-12-18 DIAGNOSIS — Z955 Presence of coronary angioplasty implant and graft: Secondary | ICD-10-CM | POA: Insufficient documentation

## 2017-12-18 DIAGNOSIS — J40 Bronchitis, not specified as acute or chronic: Secondary | ICD-10-CM

## 2017-12-18 DIAGNOSIS — I5042 Chronic combined systolic (congestive) and diastolic (congestive) heart failure: Secondary | ICD-10-CM | POA: Insufficient documentation

## 2017-12-18 DIAGNOSIS — I110 Hypertensive heart disease with heart failure: Secondary | ICD-10-CM | POA: Diagnosis not present

## 2017-12-18 DIAGNOSIS — Z7982 Long term (current) use of aspirin: Secondary | ICD-10-CM | POA: Insufficient documentation

## 2017-12-18 DIAGNOSIS — E119 Type 2 diabetes mellitus without complications: Secondary | ICD-10-CM | POA: Diagnosis not present

## 2017-12-18 DIAGNOSIS — J111 Influenza due to unidentified influenza virus with other respiratory manifestations: Secondary | ICD-10-CM | POA: Diagnosis not present

## 2017-12-18 DIAGNOSIS — Z7984 Long term (current) use of oral hypoglycemic drugs: Secondary | ICD-10-CM | POA: Insufficient documentation

## 2017-12-18 DIAGNOSIS — I2510 Atherosclerotic heart disease of native coronary artery without angina pectoris: Secondary | ICD-10-CM | POA: Insufficient documentation

## 2017-12-18 DIAGNOSIS — R05 Cough: Secondary | ICD-10-CM | POA: Diagnosis not present

## 2017-12-18 MED ORDER — ALBUTEROL SULFATE (2.5 MG/3ML) 0.083% IN NEBU
2.5000 mg | INHALATION_SOLUTION | Freq: Once | RESPIRATORY_TRACT | Status: AC
  Administered 2017-12-18: 2.5 mg via RESPIRATORY_TRACT
  Filled 2017-12-18: qty 3

## 2017-12-18 MED ORDER — PREDNISONE 50 MG PO TABS
60.0000 mg | ORAL_TABLET | Freq: Once | ORAL | Status: AC
  Administered 2017-12-18: 60 mg via ORAL
  Filled 2017-12-18: qty 1

## 2017-12-18 MED ORDER — IPRATROPIUM-ALBUTEROL 0.5-2.5 (3) MG/3ML IN SOLN
3.0000 mL | Freq: Once | RESPIRATORY_TRACT | Status: AC
  Administered 2017-12-18: 3 mL via RESPIRATORY_TRACT
  Filled 2017-12-18: qty 3

## 2017-12-18 NOTE — Discharge Instructions (Signed)
Continue prednisone and cefdinir. Use inhaler as directed.  Return if any problems.
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
## 2017-12-18 NOTE — Telephone Encounter (Signed)
Left message to return call 

## 2017-12-18 NOTE — ED Triage Notes (Signed)
Pt also C/O cough that started about 2 weeks ago.

## 2017-12-18 NOTE — ED Provider Notes (Signed)
[facility] EMERGENCY DEPARTMENT Provider Note   CSN: [id number] Arrival date & time: 12/18/17  1931     History   Chief Complaint Chief Complaint  Patient presents with  . Cough    HPI Elizabeth Aguilar is a 77 y.o. female.  The history is provided by the patient. No language interpreter was used.  Cough  This is a new problem. The current episode started 2 days ago. The problem occurs constantly. The problem has been gradually worsening. The cough is productive of sputum. There has been no fever. Associated symptoms include wheezing. Pertinent negatives include no chest pain. She has tried nothing for the symptoms. The treatment provided no relief. She is not a smoker. Her past medical history is significant for asthma. Her past medical history does not include bronchitis.  Pt complains of shortness of breath.  Pt is using her inhaler.    Past Medical History:  Diagnosis Date  . CAD (coronary artery disease)    a. s/p STEMI in 2015 with DES to LAD and stgaed PCI with DES to RCA  . Chest pain 10/10/2014  . Chronic combined systolic and diastolic congestive heart failure ([place])    a. 03/2017: echo showing mildly reduced EF of 40-45% with Grade 1 DD and mild to moderate MR.   [name] Hyperlipidemia   . Irritable bowel syndrome   . Myocardial infarction ([place])   . Pericarditis   . Type 2 diabetes mellitus [facility])     Patient Active Problem List   Diagnosis Date Noted  . Abdominal wall contusion 06/20/2017  . 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 Primary osteoarthritis of both knees 02/20/2017  . Pain of left sacroiliac joint 02/20/2017  . Primary osteoarthritis of both hips 02/20/2017  . Primary osteoarthritis of both hands 02/20/2017  . Primary osteoarthritis of both feet 02/20/2017  . DDD (degenerative disc disease), lumbar 02/20/2017  . History of hypertension 02/20/2017  . History of coronary artery disease 02/20/2017  . History of pericarditis 02/20/2017  . History of CHF (congestive  heart failure) 02/20/2017  . History of hypercholesterolemia 02/20/2017  . History of diabetes mellitus 02/20/2017  . History of high cholesterol 02/20/2017  . History of tachycardia 02/20/2017  . CAD in native artery 12/05/2015  . Arthritis 11/22/2015  . Pain in the chest   . Chest pain 08/06/2015  . Back pain 08/06/2015  . GERD (gastroesophageal reflux disease) 05/26/2015  . Chronic combined systolic and diastolic heart failure ([place])   . Pericarditis 10/10/2014  . Osteopenia 06/03/2014  . Sinus tachycardia 01/18/2014  . 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 12/17/2013    Class: Diagnosis of  . Ischemic cardiomyopathy 12/17/2013    Class: Diagnosis of  . STEMI 12/16/13- LAD DES 12/16/2013  . Chronic back pain 08/12/2013  . Hyperlipemia 04/19/2013  . Essential hypertension, benign 04/19/2013  . Diabetes type 2, controlled ([place]) 04/19/2013  . Chronic arthralgias of knees and hips 04/19/2013    Past Surgical History:  Procedure Laterality Date  . CORONARY ANGIOPLASTY WITH STENT PLACEMENT    . LEFT HEART CATHETERIZATION WITH CORONARY ANGIOGRAM N/A 12/16/2013   Procedure: LEFT HEART CATHETERIZATION WITH CORONARY ANGIOGRAM;  Surgeon: Troy Sine, MD;  Location: [facility] CATH LAB;  Service: Cardiovascular;  Laterality: N/A;  . LEFT HEART CATHETERIZATION WITH CORONARY ANGIOGRAM  12/17/2013   Procedure: LEFT HEART CATHETERIZATION WITH CORONARY ANGIOGRAM;  Surgeon: Troy Sine, MD;  Location: [facility] CATH LAB;  Service: Cardiovascular;;  . PERCUTANEOUS CORONARY STENT INTERVENTION (PCI-S) N/A 12/17/2013  Procedure: PERCUTANEOUS CORONARY STENT INTERVENTION (PCI-S);  Surgeon: Troy Sine, MD;  Location: [facility] CATH LAB;  Service: Cardiovascular;  Laterality: N/A;    OB History    No data available       Home Medications    Prior to Admission medications   Medication Sig Start Date End Date Taking? Authorizing Provider  ALPRAZolam Duanne Moron) 0.5 MG tablet TAKE ONE TABLET TWICE DAILY AS NEEDED 09/04/17    Kathyrn Drown, MD  aspirin EC 81 MG EC tablet Take 1 tablet (81 mg total) by mouth daily. 12/20/13   Eileen Stanford, PA-C  atorvastatin (LIPITOR) 40 MG tablet TAKE 40 mg DAILY AT [facility] 09/30/17   Kathyrn Drown, MD  Calcium Carbonate-Vitamin D (CALTRATE 600+D PO) Take by mouth.    [provider]  cefdinir (OMNICEF) 300 MG capsule Take 1 capsule (300 mg total) by mouth 2 (two) times daily. 12/15/17   Mikey Kirschner, MD  celecoxib (CELEBREX) 200 MG capsule Take 200 mg by mouth daily.    [provider]  diphenoxylate-atropine (LOMOTIL) 2.5-0.025 MG tablet Take 1 tablet by mouth 4 (four) times daily as needed for diarrhea or loose stools. 07/25/16   Kathyrn Drown, MD  esomeprazole ([place]) 40 MG capsule TAKE ONE CAPSULE EACH MORNING Patient taking differently: TAKE 40MG  BY MOUTH EACH MORNING 05/06/17   Kathyrn Drown, MD  fluticasone [facility]) 50 MCG/ACT nasal spray USE 2 SPRAYS IN EACH NOSTRIL DAILY 08/05/17   Mikey Kirschner, MD  furosemide (LASIX) 20 MG tablet Take 1 tablet (20 mg total) by mouth daily. 06/30/17   Ahmed Prima, Fransisco Hertz, PA-C  HYDROcodone-acetaminophen (NORCO/VICODIN) 5-325 MG tablet Take 1 tablet by mouth every 6 (six) hours as needed. 09/30/17   Kathyrn Drown, MD  ipratropium (ATROVENT) 0.06 % nasal spray Place 2 sprays into both nostrils 4 (four) times daily. 11/27/17   Kathyrn Drown, MD  ketoconazole (NIZORAL) 2 % cream Apply 1 application topically 2 (two) times daily. 08/29/16   Kathyrn Drown, MD  lisinopril (PRINIVIL,ZESTRIL) 2.5 MG tablet Take 1 tablet (2.5 mg total) by mouth daily. 06/30/17   Strader, Fransisco Hertz, PA-C  loratadine (CLARITIN) 10 MG tablet TAKE ONE (1) TABLET EACH DAY 09/03/17   Kathyrn Drown, MD  metFORMIN (GLUCOPHAGE) 500 MG tablet TAKE ONE TABLET TWICE A DAY WITH FOOD 08/05/17   Kathyrn Drown, MD  metoprolol succinate (TOPROL-XL) 100 MG 24 hr tablet TAKE 100MG  BY MOUTH DAILY 06/30/17   Ahmed Prima, Fransisco Hertz, PA-C  Multiple Vitamin  (MULTIVITAMIN WITH MINERALS) TABS tablet Take 1 tablet by mouth daily.    [provider]  nitroGLYCERIN (NITROSTAT) 0.4 MG SL tablet Place 1 tablet (0.4 mg total) under the tongue every 5 (five) minutes x 3 doses as needed for chest pain. 06/02/17   Mikey Kirschner, MD  ondansetron (ZOFRAN) 8 MG tablet TAKE ONE TABLET EVERY 8 HOURS AS NEEDED 11/03/17   Kathyrn Drown, MD  predniSONE (DELTASONE) 20 MG tablet Two daily for four days, one daily for three days 12/15/17   Mikey Kirschner, MD  VENTOLIN HFA 108 [phone] [place]) MCG/ACT inhaler USE 2 PUFFS EVERY 6 HOURS AS NEEDED 10/03/17   Kathyrn Drown, MD    Family History Family History  Problem Relation Age of Onset  . Diabetes Mother   . Diabetes Daughter   . Diabetes Son     Social History Social History   Tobacco Use  . Smoking status: Never  Smoker  . Smokeless tobacco: Never Used  Substance Use Topics  . Alcohol use: No  . Drug use: No     Allergies   Actos [pioglitazone]; Augmentin [amoxicillin-pot clavulanate]; Ceftin [cefuroxime axetil]; Darvocet [propoxyphene n-acetaminophen]; Doxycycline; Erythromycin; Levofloxacin; Sulfa antibiotics; Vioxx [rofecoxib]; Zetia [ezetimibe]; Zithromax [azithromycin]; and Zocor [simvastatin]   Review of Systems Review of Systems  Respiratory: Positive for cough and wheezing.   Cardiovascular: Negative for chest pain.  All other systems reviewed and are negative.    Physical Exam Updated Vital Signs BP (!) 181/82 (BP Location: Left Arm)   Pulse 96   Temp 98.3 F (36.8 C) (Oral)   Resp 18   Ht 5\' 4"  (1.626 m)   Wt 68.5 kg (151 lb)   SpO2 98%   BMI 25.92 kg/m   Physical Exam  Constitutional: She appears well-developed and well-nourished. No distress.  HENT:  Head: Normocephalic and atraumatic.  Eyes: Conjunctivae are normal.  Neck: Neck supple.  Cardiovascular: Normal rate and regular rhythm.  No murmur heard. Pulmonary/Chest: No respiratory distress. She has wheezes.   Abdominal: Soft. There is no tenderness.  Musculoskeletal: She exhibits no edema.  Neurological: She is alert.  Skin: Skin is warm and dry.  Psychiatric: She has a normal mood and affect.  Nursing note and vitals reviewed.    ED Treatments / Results  Labs (all labs ordered are listed, but only abnormal results are displayed) Labs Reviewed - No data to display  EKG  EKG Interpretation None       Radiology Dg Chest 2 View  Result Date: 12/18/2017 CLINICAL DATA:  Cough for 2 weeks, diagnosed with the flu on Monday, no improvement in symptoms, history of coronary artery disease post MI and coronary stenting, pericarditis, heart failure, diabetes mellitus EXAM: CHEST  2 VIEW COMPARISON:  06/21/2017 FINDINGS: Borderline enlargement of cardiac silhouette with note of coronary stents. Mediastinal contours and pulmonary vascularity normal. Bronchitic changes with mild hyperinflation and biapical scarring. No acute infiltrate, pleural effusion or pneumothorax. Bones appear demineralized. IMPRESSION: Chronic bronchitic changes with hyperinflation and biapical scarring. No acute abnormalities. Electronically Signed   By: Lavonia Dana M.D.   On: 12/18/2017 20:44    Procedures Procedures (including critical care time)  Medications Ordered in ED Medications  predniSONE (DELTASONE) tablet 60 mg (not administered)  ipratropium-albuterol (DUONEB) 0.5-2.5 (3) MG/3ML nebulizer solution 3 mL (3 mLs Nebulization Given 12/18/17 2243)     Initial Impression / Assessment and Plan / ED Course  I have reviewed the triage vital signs and the nursing notes.  Pertinent labs & imaging results that were available during my care of the patient were reviewed by me and considered in my medical decision making (see chart for details).     MDM  Chest xray reviewed by me, changes consistent with bronchitis.  Pt given albuterol neb and prednisone 60 mg.  On reexam  Pt feels better,  Pt has continued wheezing.    Pt given 2nd albuterol treatment.  Pt feels better and wants to go home.  I advised continue omnicef and prednisone.   Final Clinical Impressions(s) / ED Diagnoses   Final diagnoses:  Bronchitis    ED Discharge Orders    None    An After Visit Summary was printed and given to the patient.    Fransico Meadow, Hershal Coria 12/18/17 2332    Daleen Bo, MD 12/19/17 [phone]

## 2017-12-18 NOTE — Telephone Encounter (Signed)
Patient is aware to go to the ED.

## 2017-12-18 NOTE — ED Triage Notes (Signed)
Per EMS pt states her primary care told her she hold the flu on Monday 12/15/17. Pt was told to come to the ER if symptoms did not resolve.

## 2017-12-18 NOTE — Telephone Encounter (Signed)
Patient seen Dr. Richardson Landry on 12/15/17 for rhinosinusitis.  She was prescribed cefdinir and prednisone.   She said she is now worse, with chest rattling, cough, trouble breathing, wheezing.  She does get relief off of her inhaler for about 2-3 hours. She said she is in the bed and is not getting out of it today.  She would like a different medication called in.  [facility]

## 2017-12-31 ENCOUNTER — Encounter: Payer: Self-pay | Admitting: Family Medicine

## 2017-12-31 ENCOUNTER — Ambulatory Visit (INDEPENDENT_AMBULATORY_CARE_PROVIDER_SITE_OTHER): Payer: Medicare Other | Admitting: Family Medicine

## 2017-12-31 VITALS — BP 118/78 | Ht 64.0 in | Wt 154.2 lb

## 2017-12-31 DIAGNOSIS — E1121 Type 2 diabetes mellitus with diabetic nephropathy: Secondary | ICD-10-CM

## 2017-12-31 DIAGNOSIS — I10 Essential (primary) hypertension: Secondary | ICD-10-CM

## 2017-12-31 DIAGNOSIS — G8929 Other chronic pain: Secondary | ICD-10-CM | POA: Diagnosis not present

## 2017-12-31 DIAGNOSIS — M5440 Lumbago with sciatica, unspecified side: Secondary | ICD-10-CM | POA: Diagnosis not present

## 2017-12-31 DIAGNOSIS — M109 Gout, unspecified: Secondary | ICD-10-CM | POA: Diagnosis not present

## 2017-12-31 DIAGNOSIS — E785 Hyperlipidemia, unspecified: Secondary | ICD-10-CM | POA: Diagnosis not present

## 2017-12-31 DIAGNOSIS — R0609 Other forms of dyspnea: Secondary | ICD-10-CM | POA: Diagnosis not present

## 2017-12-31 MED ORDER — HYDROCODONE-ACETAMINOPHEN 5-325 MG PO TABS: 1.0000 | ORAL_TABLET | Freq: Four times a day (QID) | ORAL | 0 refills | Status: DC | PRN

## 2017-12-31 MED ORDER — ALPRAZOLAM 0.5 MG PO TABS: ORAL_TABLET | ORAL | 5 refills | Status: DC

## 2017-12-31 NOTE — Progress Notes (Signed)
Subjective:    Patient ID: Elizabeth Aguilar, female    DOB: 08-05-1941, 77 y.o.   MRN: [id number]  HPI This patient was seen today for chronic pain  The medication list was reviewed and updated.   -Compliance with medication: yes  - Number patient states they take daily:4  -when was the last dose patient took? today  The patient was advised the importance of maintaining medication and not using illegal substances with these.  Here for refills and follow up  The patient was educated that we can provide 3 monthly scripts for their medication, it is their responsibility to follow the instructions.  Side effects or complications from medications: none  Patient is aware that pain medications are meant to minimize the severity of the pain to allow their pain levels to improve to allow for better function. They are aware of that pain medications cannot totally remove their pain.  Due for UDT ( at least once per year) : 06/2017  Patient here for follow-up regarding cholesterol.  Patient does try to maintain a reasonable diet.  Patient does take the medication on a regular basis.  Denies missing a dose.  The patient denies any obvious side effects.  Prior blood work results reviewed with the patient.  The patient is aware of his cholesterol goals and the need to keep it under good control to lessen the risk of disease.  The patient was seen today as part of a comprehensive diabetic check up.The patient relates medication compliance. No significant side effects to the medications. Denies any low glucose spells. Relates compliance with diet to a reasonable level. Patient does do labwork intermittently and understands the dangers of diabetes.  Patient for blood pressure check up. Patient relates compliance with meds. Todays BP reviewed with the patient. Patient denies issues with medication. Patient relates reasonable diet. Patient tries to minimize salt. Patient aware of BP  goals.       Review of Systems  Constitutional: Negative for activity change, fatigue and fever.  HENT: Negative for congestion.   Respiratory: Negative for cough, chest tightness and shortness of breath.   Cardiovascular: Negative for chest pain and leg swelling.  Gastrointestinal: Negative for abdominal pain.  Skin: Negative for color change.  Neurological: Negative for headaches.  Psychiatric/Behavioral: Negative for behavioral problems.       Objective:   Physical Exam  Constitutional: She appears well-developed and well-nourished. No distress.  HENT:  Head: Normocephalic and atraumatic.  Eyes: Right eye exhibits no discharge. Left eye exhibits no discharge.  Neck: No tracheal deviation present.  Cardiovascular: Normal rate, regular rhythm and normal heart sounds.  No murmur heard. Pulmonary/Chest: Effort normal and breath sounds normal. No respiratory distress. She has no wheezes. She has no rales.  Musculoskeletal: She exhibits no edema.  Lymphadenopathy:    She has no cervical adenopathy.  Neurological: She is alert. She exhibits normal muscle tone.  Skin: Skin is warm and dry. No erythema.  Psychiatric: Her behavior is normal.  Vitals reviewed.         Assessment & Plan:  Dyspnea-I believe that this is more likely related to deconditioning but she was exposed to a large amount of secondhand smoke for years so therefore pre-and post PFT would be reasonable to rule out COPD  Recent flu illness with bronchitis resolved  Heart disease stable  Blood pressure good control continue current medicines  Diabetes under good control with medication and with diet recheck lab work  Hyperlipidemia previous  labs reviewed new labs ordered continue medication  Chronic anxiety Xanax twice a day as necessary does not cause drowsiness for the patient she has been on it for years  Chronic pain The patient was seen today as part of a comprehensive visit regarding pain  control. Patient's compliance with the medication as well as discussion regarding effectiveness was completed. Prescriptions were written. Patient was advised to follow-up in 3 months. The patient was assessed for any signs of severe side effects. The patient was advised to take the medicine as directed and to report to [place] if any side effect issues.

## 2018-01-05 DIAGNOSIS — R319 Hematuria, unspecified: Secondary | ICD-10-CM | POA: Diagnosis not present

## 2018-01-05 DIAGNOSIS — N390 Urinary tract infection, site not specified: Secondary | ICD-10-CM | POA: Diagnosis not present

## 2018-01-05 DIAGNOSIS — R300 Dysuria: Secondary | ICD-10-CM | POA: Diagnosis not present

## 2018-01-15 DIAGNOSIS — Z6827 Body mass index (BMI) 27.0-27.9, adult: Secondary | ICD-10-CM | POA: Diagnosis not present

## 2018-01-15 DIAGNOSIS — R5383 Other fatigue: Secondary | ICD-10-CM | POA: Diagnosis not present

## 2018-01-15 DIAGNOSIS — M2550 Pain in unspecified joint: Secondary | ICD-10-CM | POA: Diagnosis not present

## 2018-01-15 DIAGNOSIS — E663 Overweight: Secondary | ICD-10-CM | POA: Diagnosis not present

## 2018-01-15 DIAGNOSIS — M150 Primary generalized (osteo)arthritis: Secondary | ICD-10-CM | POA: Diagnosis not present

## 2018-01-15 DIAGNOSIS — M545 Low back pain: Secondary | ICD-10-CM | POA: Diagnosis not present

## 2018-01-15 NOTE — Progress Notes (Deleted)
Office Visit Note  Patient: Elizabeth Aguilar             Date of Birth: [birth year]           MRN: [id number]             PCP: Kathyrn Drown, MD Referring: Kathyrn Drown, MD Visit Date: 01/29/2018 Occupation: @GUAROCC @    Subjective:  No chief complaint on file.   History of Present Illness: Elizabeth Aguilar is a 77 y.o. female ***   Activities of Daily Living:  Patient reports morning stiffness for *** {minute/hour:19697}.   Patient {ACTIONS;DENIES/REPORTS:21021675::"Denies"} nocturnal pain.  Difficulty dressing/grooming: {ACTIONS;DENIES/REPORTS:21021675::"Denies"} Difficulty climbing stairs: {ACTIONS;DENIES/REPORTS:21021675::"Denies"} Difficulty getting out of chair: {ACTIONS;DENIES/REPORTS:21021675::"Denies"} Difficulty using hands for taps, buttons, cutlery, and/or writing: {ACTIONS;DENIES/REPORTS:21021675::"Denies"}   No Rheumatology ROS completed.   PMFS History:  Patient Active Problem List   Diagnosis Date Noted  . Abdominal wall contusion 06/20/2017  . 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 Primary osteoarthritis of both knees 02/20/2017  . Pain of left sacroiliac joint 02/20/2017  . Primary osteoarthritis of both hips 02/20/2017  . Primary osteoarthritis of both hands 02/20/2017  . Primary osteoarthritis of both feet 02/20/2017  . DDD (degenerative disc disease), lumbar 02/20/2017  . History of hypertension 02/20/2017  . History of coronary artery disease 02/20/2017  . History of pericarditis 02/20/2017  . History of CHF (congestive heart failure) 02/20/2017  . History of hypercholesterolemia 02/20/2017  . History of diabetes mellitus 02/20/2017  . History of high cholesterol 02/20/2017  . History of tachycardia 02/20/2017  . CAD in native artery 12/05/2015  . Arthritis 11/22/2015  . Pain in the chest   . Chest pain 08/06/2015  . Back pain 08/06/2015  . GERD (gastroesophageal reflux disease) 05/26/2015  . Chronic combined systolic and diastolic heart failure  ([place])   . Pericarditis 10/10/2014  . Osteopenia 06/03/2014  . Sinus tachycardia 01/18/2014  . 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 12/17/2013    Class: Diagnosis of  . Ischemic cardiomyopathy 12/17/2013    Class: Diagnosis of  . STEMI 12/16/13- LAD DES 12/16/2013  . Chronic back pain 08/12/2013  . Hyperlipemia 04/19/2013  . Essential hypertension, benign 04/19/2013  . Diabetes type 2, controlled ([place]) 04/19/2013  . Chronic arthralgias of knees and hips 04/19/2013    Past Medical History:  Diagnosis Date  . CAD (coronary artery disease)    a. s/p STEMI in 2015 with DES to LAD and stgaed PCI with DES to RCA  . Chest pain 10/10/2014  . Chronic combined systolic and diastolic congestive heart failure ([place])    a. 03/2017: echo showing mildly reduced EF of 40-45% with Grade 1 DD and mild to moderate MR.   [name] Hyperlipidemia   . Irritable bowel syndrome   . Myocardial infarction ([place])   . Pericarditis   . Type 2 diabetes mellitus ([facility])     Family History  Problem Relation Age of Onset  . Diabetes Mother   . Diabetes Daughter   . Diabetes Son    Past Surgical History:  Procedure Laterality Date  . CORONARY ANGIOPLASTY WITH STENT PLACEMENT    . LEFT HEART CATHETERIZATION WITH CORONARY ANGIOGRAM N/A 12/16/2013   Procedure: LEFT HEART CATHETERIZATION WITH CORONARY ANGIOGRAM;  Surgeon: Troy Sine, MD;  Location: [facility] CATH LAB;  Service: Cardiovascular;  Laterality: N/A;  . LEFT HEART CATHETERIZATION WITH CORONARY ANGIOGRAM  12/17/2013   Procedure: LEFT HEART CATHETERIZATION WITH CORONARY ANGIOGRAM;  Surgeon: Troy Sine, MD;  Location: [facility]  CATH LAB;  Service: Cardiovascular;;  . PERCUTANEOUS CORONARY STENT INTERVENTION (PCI-S) N/A 12/17/2013   Procedure: PERCUTANEOUS CORONARY STENT INTERVENTION (PCI-S);  Surgeon: Troy Sine, MD;  Location: [facility] CATH LAB;  Service: Cardiovascular;  Laterality: N/A;   Social History   Social History Narrative  . Not on file     Objective: Vital Signs: There  were no vitals taken for this visit.   Physical Exam   Musculoskeletal Exam: ***  CDAI Exam: No CDAI exam completed.    Investigation: No additional findings. CBC Latest Ref Rng & Units 07/31/2017 06/27/2017 06/21/2017  WBC 3.8 - 10.8 Thousand/uL 9.6 - 6.5  Hemoglobin 11.7 - 15.5 g/dL 11.0(L) 11.2(A) 8.7(L)  Hematocrit 35.0 - 45.0 % 33.2(L) - 26.6(L)  Platelets 140 - 400 Thousand/uL 325 - 241   CMP Latest Ref Rng & Units 07/31/2017 06/21/2017 06/20/2017  Glucose 65 - 99 mg/dL 102(H) 127(H) 116(H)  BUN 7 - 25 mg/dL 16 20 23(H)  Creatinine 0.60 - 0.93 mg/dL 1.07(H) 1.32(H) 1.10(H)  Sodium 135 - 146 mmol/L 142 135 136  Potassium 3.5 - 5.3 mmol/L 4.1 4.2 4.3  Chloride 98 - 110 mmol/L 105 101 100(L)  CO2 20 - 32 mmol/L 29 24 -  Calcium 8.6 - 10.4 mg/dL 9.4 8.5(L) -  Total Protein 6.1 - 8.1 g/dL 6.6 - -  Total Bilirubin 0.2 - 1.2 mg/dL 0.3 - -  Alkaline Phos 38 - 126 U/L - - -  AST 10 - 35 U/L 14 - -  ALT 6 - 29 U/L 11 - -    Imaging: Dg Chest 2 View  Result Date: 12/18/2017 CLINICAL DATA:  Cough for 2 weeks, diagnosed with the flu on Monday, no improvement in symptoms, history of coronary artery disease post MI and coronary stenting, pericarditis, heart failure, diabetes mellitus EXAM: CHEST  2 VIEW COMPARISON:  06/21/2017 FINDINGS: Borderline enlargement of cardiac silhouette with note of coronary stents. Mediastinal contours and pulmonary vascularity normal. Bronchitic changes with mild hyperinflation and biapical scarring. No acute infiltrate, pleural effusion or pneumothorax. Bones appear demineralized. IMPRESSION: Chronic bronchitic changes with hyperinflation and biapical scarring. No acute abnormalities. Electronically Signed   By: Lavonia Dana M.D.   On: 12/18/2017 20:44    Speciality Comments: No specialty comments available.    Procedures:  No procedures performed Allergies: Actos [pioglitazone]; Augmentin [amoxicillin-pot clavulanate]; Ceftin [cefuroxime axetil]; Darvocet  [propoxyphene n-acetaminophen]; Doxycycline; Erythromycin; Levofloxacin; Sulfa antibiotics; Vioxx [rofecoxib]; Zetia [ezetimibe]; Zithromax [azithromycin]; and Zocor [simvastatin]   Assessment / Plan:     Visit Diagnoses: No diagnosis found.    Orders: No orders of the defined types were placed in this encounter.  No orders of the defined types were placed in this encounter.   Face-to-face time spent with patient was *** minutes. 50% of time was spent in counseling and coordination of care.  Follow-Up Instructions: No Follow-up on file.   Earnestine Mealing, CMA  Note - This record has been created using Editor, commissioning.  Chart creation errors have been sought, but may not always  have been located. Such creation errors do not reflect on  the standard of medical care.

## 2018-01-22 ENCOUNTER — Ambulatory Visit (HOSPITAL_COMMUNITY): Admission: RE | Admit: 2018-01-22 | Payer: Medicare Other | Source: Ambulatory Visit

## 2018-01-29 ENCOUNTER — Ambulatory Visit: Payer: Medicare Other | Admitting: Rheumatology

## 2018-01-30 ENCOUNTER — Other Ambulatory Visit: Payer: Self-pay | Admitting: Family Medicine

## 2018-02-04 ENCOUNTER — Telehealth: Payer: Self-pay | Admitting: Family Medicine
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
## 2018-02-04 NOTE — Telephone Encounter (Signed)
1.  Please gather information regarding her symptomatology with her stomach what type of symptoms that she having?  We may need to do ultrasound to look at the gallstone region.  A CAT scan would not be adequate.  #2 as for the adrenal gland radiology expert recommended a follow-up scan in 1 year which would be August-please inform the patient.  #3 after gathering the information please let the patient know I will review this information more than likely order ultrasound lab work and a follow-up office visit.

## 2018-02-04 NOTE — Telephone Encounter (Signed)
Patient has CT scan in 06/2017 for adrenal mass and it says repeat CT in 1 year and it is in the tickler file to be repeated 06/2018

## 2018-02-05 ENCOUNTER — Other Ambulatory Visit: Payer: Self-pay | Admitting: Family Medicine

## 2018-02-05 DIAGNOSIS — M461 Sacroiliitis, not elsewhere classified: Secondary | ICD-10-CM | POA: Diagnosis not present

## 2018-02-05 DIAGNOSIS — R1011 Right upper quadrant pain: Secondary | ICD-10-CM

## 2018-02-05 DIAGNOSIS — Z1589 Genetic susceptibility to other disease: Secondary | ICD-10-CM | POA: Diagnosis not present

## 2018-02-05 DIAGNOSIS — M150 Primary generalized (osteo)arthritis: Secondary | ICD-10-CM | POA: Diagnosis not present

## 2018-02-05 DIAGNOSIS — E663 Overweight: Secondary | ICD-10-CM | POA: Diagnosis not present

## 2018-02-05 DIAGNOSIS — M2550 Pain in unspecified joint: Secondary | ICD-10-CM | POA: Diagnosis not present

## 2018-02-05 DIAGNOSIS — Z6827 Body mass index (BMI) 27.0-27.9, adult: Secondary | ICD-10-CM | POA: Diagnosis not present

## 2018-02-05 DIAGNOSIS — M545 Low back pain: Secondary | ICD-10-CM | POA: Diagnosis not present

## 2018-02-05 NOTE — Telephone Encounter (Signed)
Left message to return call 

## 2018-02-05 NOTE — Telephone Encounter (Signed)
Pt returned call and stated that her stomach was "sour" , having stomach pains at time, nauseous. Is eating and drinking and using the bathroom OK. She states she is taking her meds as prescribed to help with her stomach/acid reflux. She said that the gallstone in her duct is to be checked every year. Nurse told patient about the follow up scan in August.

## 2018-02-05 NOTE — Telephone Encounter (Signed)
Left message to return call. Will put ultrasound order in when pt calls back so we can get a preferred date.

## 2018-02-05 NOTE — Telephone Encounter (Signed)
I recommend ultrasound of the right upper quadrant of the abdomen attention liver gallbladder/gallstone also patient has lab work which was ordered back in February she needs to do this fasting.  Plus set up a follow-up office visit anywhere from 3-7 days after the ultrasound and blood work are completed

## 2018-02-16 ENCOUNTER — Other Ambulatory Visit: Payer: Self-pay | Admitting: Family Medicine

## 2018-02-16 DIAGNOSIS — R1011 Right upper quadrant pain: Secondary | ICD-10-CM

## 2018-02-16 NOTE — Telephone Encounter (Signed)
She will look at her calendar and see when she can schedule an appt to follow up with Dr.Scott.

## 2018-02-16 NOTE — Telephone Encounter (Signed)
Patient is aware we have scheduled the appt for 02/24/2018 Npo after midnight the night before.

## 2018-02-16 NOTE — Telephone Encounter (Signed)
Pt returned a nurse's call

## 2018-02-16 NOTE — Telephone Encounter (Signed)
Patient is aware of all. 

## 2018-02-19 ENCOUNTER — Other Ambulatory Visit: Payer: Self-pay

## 2018-02-19 DIAGNOSIS — R1011 Right upper quadrant pain: Secondary | ICD-10-CM

## 2018-02-24 ENCOUNTER — Ambulatory Visit (HOSPITAL_COMMUNITY)
Admission: RE | Admit: 2018-02-24 | Discharge: 2018-02-24 | Disposition: A | Discharge status: Home / Self Care | Payer: Medicare Other | Source: Ambulatory Visit | Attending: Family Medicine | Admitting: Family Medicine

## 2018-02-24 DIAGNOSIS — R1011 Right upper quadrant pain: Secondary | ICD-10-CM | POA: Diagnosis not present

## 2018-02-24 DIAGNOSIS — K8020 Calculus of gallbladder without cholecystitis without obstruction: Secondary | ICD-10-CM | POA: Diagnosis not present

## 2018-02-24 DIAGNOSIS — E1121 Type 2 diabetes mellitus with diabetic nephropathy: Secondary | ICD-10-CM | POA: Diagnosis not present

## 2018-02-24 DIAGNOSIS — I10 Essential (primary) hypertension: Secondary | ICD-10-CM | POA: Diagnosis not present

## 2018-02-24 DIAGNOSIS — E785 Hyperlipidemia, unspecified: Secondary | ICD-10-CM | POA: Diagnosis not present

## 2018-02-24 DIAGNOSIS — G8929 Other chronic pain: Secondary | ICD-10-CM | POA: Diagnosis not present

## 2018-02-24 DIAGNOSIS — M5440 Lumbago with sciatica, unspecified side: Secondary | ICD-10-CM | POA: Diagnosis not present

## 2018-02-24 DIAGNOSIS — M109 Gout, unspecified: Secondary | ICD-10-CM | POA: Diagnosis not present

## 2018-02-25 LAB — HEPATIC FUNCTION PANEL
ALK PHOS: 155 IU/L — AB (ref 39–117)
ALT: 14 IU/L (ref 0–32)
AST: 12 IU/L (ref 0–40)
Albumin: 3.9 g/dL (ref 3.5–4.8)
Bilirubin Total: 0.3 mg/dL (ref 0.0–1.2)
Bilirubin, Direct: 0.11 mg/dL (ref 0.00–0.40)
TOTAL PROTEIN: 6.3 g/dL (ref 6.0–8.5)

## 2018-02-25 LAB — BASIC METABOLIC PANEL
BUN / CREAT RATIO: 18 (ref 12–28)
BUN: 23 mg/dL (ref 8–27)
CO2: 24 mmol/L (ref 20–29)
CREATININE: 1.26 mg/dL — AB (ref 0.57–1.00)
Calcium: 9.8 mg/dL (ref 8.7–10.3)
Chloride: 104 mmol/L (ref 96–106)
GFR, EST AFRICAN AMERICAN: 47 mL/min/{1.73_m2} — AB (ref >59)
GFR, EST NON AFRICAN AMERICAN: 41 mL/min/{1.73_m2} — AB (ref >59)
Glucose: 104 mg/dL — ABNORMAL HIGH (ref 65–99)
Potassium: 4.8 mmol/L (ref 3.5–5.2)
SODIUM: 144 mmol/L (ref 134–144)

## 2018-02-25 LAB — LIPID PANEL
Chol/HDL Ratio: 3.5 ratio (ref 0.0–4.4)
Cholesterol, Total: 164 mg/dL (ref 100–199)
HDL: 47 mg/dL (ref >39)
LDL Calculated: 90 mg/dL (ref 0–99)
Triglycerides: 136 mg/dL (ref 0–149)
VLDL CHOLESTEROL CAL: 27 mg/dL (ref 5–40)

## 2018-02-25 LAB — MICROALBUMIN / CREATININE URINE RATIO
Creatinine, Urine: 136.6 mg/dL
Microalb/Creat Ratio: 103.2 mg/g creat — ABNORMAL HIGH (ref 0.0–30.0)
Microalbumin, Urine: 141 ug/mL

## 2018-02-25 LAB — MAGNESIUM: MAGNESIUM: 1.6 mg/dL (ref 1.6–2.3)

## 2018-02-25 LAB — URIC ACID: Uric Acid: 7.8 mg/dL — ABNORMAL HIGH (ref 2.5–7.1)

## 2018-02-25 LAB — HEMOGLOBIN A1C
Est. average glucose Bld gHb Est-mCnc: 134 mg/dL
Hgb A1c MFr Bld: 6.3 % — ABNORMAL HIGH (ref 4.8–5.6)

## 2018-02-27 ENCOUNTER — Ambulatory Visit: Payer: Medicare Other | Admitting: Family Medicine

## 2018-03-01 ENCOUNTER — Other Ambulatory Visit: Payer: Self-pay | Admitting: Family Medicine

## 2018-03-05 ENCOUNTER — Encounter: Payer: Self-pay | Admitting: Family Medicine

## 2018-03-05 ENCOUNTER — Ambulatory Visit (INDEPENDENT_AMBULATORY_CARE_PROVIDER_SITE_OTHER): Payer: Medicare Other | Admitting: Family Medicine

## 2018-03-05 VITALS — BP 118/74 | Temp 98.5°F | Ht 64.0 in | Wt 150.0 lb

## 2018-03-05 DIAGNOSIS — I10 Essential (primary) hypertension: Secondary | ICD-10-CM

## 2018-03-05 DIAGNOSIS — K8020 Calculus of gallbladder without cholecystitis without obstruction: Secondary | ICD-10-CM | POA: Insufficient documentation

## 2018-03-05 DIAGNOSIS — E1121 Type 2 diabetes mellitus with diabetic nephropathy: Secondary | ICD-10-CM

## 2018-03-05 DIAGNOSIS — N289 Disorder of kidney and ureter, unspecified: Secondary | ICD-10-CM | POA: Diagnosis not present

## 2018-03-05 DIAGNOSIS — E7849 Other hyperlipidemia: Secondary | ICD-10-CM

## 2018-03-05 MED ORDER — METFORMIN HCL 500 MG PO TABS: ORAL_TABLET | ORAL | 1 refills | Status: DC

## 2018-03-05 MED ORDER — ATORVASTATIN CALCIUM 80 MG PO TABS: ORAL_TABLET | ORAL | 1 refills | Status: DC

## 2018-03-05 NOTE — Patient Instructions (Signed)
Diabetes Mellitus and Nutrition When you have diabetes (diabetes mellitus), it is very important to have healthy eating habits because your blood sugar (glucose) levels are greatly affected by what you eat and drink. Eating healthy foods in the appropriate amounts, at about the same times every day, can help you:  Control your blood glucose.  Lower your risk of heart disease.  Improve your blood pressure.  Reach or maintain a healthy weight.  Every person with diabetes is different, and each person has different needs for a meal plan. Your health care provider may recommend that you work with a diet and nutrition specialist (dietitian) to make a meal plan that is best for you. Your meal plan may vary depending on factors such as:  The calories you need.  The medicines you take.  Your weight.  Your blood glucose, blood pressure, and cholesterol levels.  Your activity level.  Other health conditions you have, such as heart or kidney disease.  How do carbohydrates affect me? Carbohydrates affect your blood glucose level more than any other type of food. Eating carbohydrates naturally increases the amount of glucose in your blood. Carbohydrate counting is a method for keeping track of how many carbohydrates you eat. Counting carbohydrates is important to keep your blood glucose at a healthy level, especially if you use insulin or take certain oral diabetes medicines. It is important to know how many carbohydrates you can safely have in each meal. This is different for every person. Your dietitian can help you calculate how many carbohydrates you should have at each meal and for snack. Foods that contain carbohydrates include:  Bread, cereal, rice, pasta, and crackers.  Potatoes and corn.  Peas, beans, and lentils.  Milk and yogurt.  Fruit and juice.  Desserts, such as cakes, cookies, ice cream, and candy.  How does alcohol affect me? Alcohol can cause a sudden decrease in blood  glucose (hypoglycemia), especially if you use insulin or take certain oral diabetes medicines. Hypoglycemia can be a life-threatening condition. Symptoms of hypoglycemia (sleepiness, dizziness, and confusion) are similar to symptoms of having too much alcohol. If your health care provider says that alcohol is safe for you, follow these guidelines:  Limit alcohol intake to no more than 1 drink per day for nonpregnant women and 2 drinks per day for men. One drink equals 12 oz of beer, 5 oz of wine, or 1 oz of hard liquor.  Do not drink on an empty stomach.  Keep yourself hydrated with water, diet soda, or unsweetened iced tea.  Keep in mind that regular soda, juice, and other mixers may contain a lot of sugar and must be counted as carbohydrates.  What are tips for following this plan? Reading food labels  Start by checking the serving size on the label. The amount of calories, carbohydrates, fats, and other nutrients listed on the label are based on one serving of the food. Many foods contain more than one serving per package.  Check the total grams (g) of carbohydrates in one serving. You can calculate the number of servings of carbohydrates in one serving by dividing the total carbohydrates by 15. For example, if a food has 30 g of total carbohydrates, it would be equal to 2 servings of carbohydrates.  Check the number of grams (g) of saturated and trans fats in one serving. Choose foods that have low or no amount of these fats.  Check the number of milligrams (mg) of sodium in one serving. Most people   should limit total sodium intake to less than 2,300 mg per day.  Always check the nutrition information of foods labeled as "low-fat" or "nonfat". These foods may be higher in added sugar or refined carbohydrates and should be avoided.  Talk to your dietitian to identify your daily goals for nutrients listed on the label. Shopping  Avoid buying canned, premade, or processed foods. These  foods tend to be high in fat, sodium, and added sugar.  Shop around the outside edge of the grocery store. This includes fresh fruits and vegetables, bulk grains, fresh meats, and fresh dairy. Cooking  Use low-heat cooking methods, such as baking, instead of high-heat cooking methods like deep frying.  Cook using healthy oils, such as olive, canola, or sunflower oil.  Avoid cooking with butter, cream, or high-fat meats. Meal planning  Eat meals and snacks regularly, preferably at the same times every day. Avoid going long periods of time without eating.  Eat foods high in fiber, such as fresh fruits, vegetables, beans, and whole grains. Talk to your dietitian about how many servings of carbohydrates you can eat at each meal.  Eat 4-6 ounces of lean protein each day, such as lean meat, chicken, fish, eggs, or tofu. 1 ounce is equal to 1 ounce of meat, chicken, or fish, 1 egg, or 1/4 cup of tofu.  Eat some foods each day that contain healthy fats, such as avocado, nuts, seeds, and fish. Lifestyle   Check your blood glucose regularly.  Exercise at least 30 minutes 5 or more days each week, or as told by your health care provider.  Take medicines as told by your health care provider.  Do not use any products that contain nicotine or tobacco, such as cigarettes and e-cigarettes. If you need help quitting, ask your health care provider.  Work with a counselor or diabetes educator to identify strategies to manage stress and any emotional and social challenges. What are some questions to ask my health care provider?  Do I need to meet with a diabetes educator?  Do I need to meet with a dietitian?  What number can I call if I have questions?  When are the best times to check my blood glucose? Where to find more information:  American Diabetes Association: diabetes.org/food-and-fitness/food  Academy of Nutrition and Dietetics:  www.eatright.org/resources/health/diseases-and-conditions/diabetes  National Institute of Diabetes and Digestive and Kidney Diseases (NIH): www.niddk.nih.gov/health-information/diabetes/overview/diet-eating-physical-activity Summary  A healthy meal plan will help you control your blood glucose and maintain a healthy lifestyle.  Working with a diet and nutrition specialist (dietitian) can help you make a meal plan that is best for you.  Keep in mind that carbohydrates and alcohol have immediate effects on your blood glucose levels. It is important to count carbohydrates and to use alcohol carefully. This information is not intended to replace advice given to you by your health care provider. Make sure you discuss any questions you have with your health care provider. Document Released: 07/25/2005 Document Revised: 12/02/2016 Document Reviewed: 12/02/2016 Elsevier Interactive Patient Education  2018 [employer] Inc.  

## 2018-03-05 NOTE — Progress Notes (Signed)
Subjective:    Patient ID: Elizabeth Aguilar, female    DOB: 06-23-1941, 77 y.o.   MRN: [id number]  HPIFollow up lab results.   Patient has history of heart disease Does take cholesterol medicine Bad cholesterol significantly elevated above threshold Patient understands the importance of getting this under better control Does not always watch closely how she eats  The patient was seen today as part of a comprehensive diabetic check up.The patient relates medication compliance. No significant side effects to the medications. Denies any low glucose spells. Relates compliance with diet to a reasonable level. Patient does do labwork intermittently and understands the dangers of diabetes.   Patient for blood pressure check up. Patient relates compliance with meds. Todays BP reviewed with the patient. Patient denies issues with medication. Patient relates reasonable diet. Patient tries to minimize salt. Patient aware of BP goals.  Patient here for follow-up regarding cholesterol.  Patient does try to maintain a reasonable diet.  Patient does take the medication on a regular basis.  Denies missing a dose.  The patient denies any obvious side effects.  Prior blood work results reviewed with the patient.  The patient is aware of his cholesterol goals and the need to keep it under good control to lessen the risk of disease.  Patient does have significant allergies takes her medicine regular basis overall under decent control  Patient with chronic pain and discomfort uses hydrocodone intermittently for arthritic pain  Patient was having abdominal pain this is resolved he did have an ultrasound which showed gallstones Recently saw a rheumatologist who put her on anti-inflammatory and she does have renal disease.  No appetite since 5 -6 days. Nausea sometimes. Fever last Friday.  Patient relates low-grade fever some nausea no abdominal pain no chest tightness pressure pain no drainage coughing wheezing  or difficulty breathing   Review of Systems  Constitutional: Negative for activity change, fatigue and fever.  HENT: Negative for congestion.   Respiratory: Negative for cough, chest tightness and shortness of breath.   Cardiovascular: Negative for chest pain and leg swelling.  Gastrointestinal: Negative for abdominal pain.  Skin: Negative for color change.  Neurological: Negative for headaches.  Psychiatric/Behavioral: Negative for behavioral problems.       Objective:   Physical Exam  Constitutional: She appears well-developed and well-nourished. No distress.  HENT:  Head: Normocephalic and atraumatic.  Eyes: Right eye exhibits no discharge. Left eye exhibits no discharge.  Neck: No tracheal deviation present.  Cardiovascular: Normal rate, regular rhythm and normal heart sounds.  No murmur heard. Pulmonary/Chest: Effort normal and breath sounds normal. No respiratory distress. She has no wheezes. She has no rales.  Musculoskeletal: She exhibits no edema.  Lymphadenopathy:    She has no cervical adenopathy.  Neurological: She is alert. She exhibits normal muscle tone.  Skin: Skin is warm and dry. No erythema.  Psychiatric: Her behavior is normal.  Vitals reviewed.  Results for orders placed or performed in visit on 12/31/17  Lipid panel  Result Value Ref Range   Cholesterol, Total 164 100 - 199 mg/dL   Triglycerides 136 0 - 149 mg/dL   HDL 47 >39 mg/dL   VLDL Cholesterol Cal 27 5 - 40 mg/dL   LDL Calculated 90 0 - 99 mg/dL   Chol/HDL Ratio 3.5 0.0 - 4.4 ratio  Basic metabolic panel  Result Value Ref Range   Glucose 104 (H) 65 - 99 mg/dL   BUN 23 8 - 27 mg/dL  Creatinine, Ser 1.26 (H) 0.57 - 1.00 mg/dL   GFR calc non Af Amer 41 (L) >59 mL/min/1.73   GFR calc Af Amer 47 (L) >59 mL/min/1.73   BUN/Creatinine Ratio 18 12 - 28   Sodium 144 134 - 144 mmol/L   Potassium 4.8 3.5 - 5.2 mmol/L   Chloride 104 96 - 106 mmol/L   CO2 24 20 - 29 mmol/L   Calcium 9.8 8.7 - 10.3  mg/dL  Hepatic function panel  Result Value Ref Range   Total Protein 6.3 6.0 - 8.5 g/dL   Albumin 3.9 3.5 - 4.8 g/dL   Bilirubin Total 0.3 0.0 - 1.2 mg/dL   Bilirubin, Direct 0.11 0.00 - 0.40 mg/dL   Alkaline Phosphatase 155 (H) 39 - 117 IU/L   AST 12 0 - 40 IU/L   ALT 14 0 - 32 IU/L  Magnesium  Result Value Ref Range   Magnesium 1.6 1.6 - 2.3 mg/dL  Hemoglobin A1c  Result Value Ref Range   Hgb A1c MFr Bld 6.3 (H) 4.8 - 5.6 %   Est. average glucose Bld gHb Est-mCnc 134 mg/dL  Microalbumin / creatinine urine ratio  Result Value Ref Range   Creatinine, Urine 136.6 Not Estab. mg/dL   Microalbumin, Urine 141.0 Not Estab. ug/mL   Microalb/Creat Ratio 103.2 (H) 0.0 - 30.0 mg/g creat  Uric acid  Result Value Ref Range   Uric Acid 7.8 (H) 2.5 - 7.1 mg/dL   Gallstone pathophysiology discussed       Assessment & Plan:  Recent viral illness should gradually go away on its own  Gallstone not causing any symptoms currently educated patient regarding what to watch for  Renal insufficiency stop NSAIDs.  Patient may use hydrocodone for pain Avoid excessive protein intake  Heart disease stable  Hyperlipidemia not at goal increase Lipitor  Renal function subpar reduce metformin to 1 tablet daily recheck labs again in several months- if kidney functions are worsening will stop metformin  Diabetes decent control watch diet recheck labs several follow-up 3 months   months  25 minutes was spent with the patient.  This statement verifies that 25 minutes was indeed spent with the patient. Greater than half the time was spent in discussion, counseling and answering questions  regarding the issues that the patient came in for today as reflected in the diagnosis (s) please refer to documentation for further details. Significant time spent discussing diet activity medications adjustments of medicines answering numerous questions and discussing renal insufficiency greater than half the time  spent in discussion

## 2018-03-17 DIAGNOSIS — H2513 Age-related nuclear cataract, bilateral: Secondary | ICD-10-CM | POA: Diagnosis not present

## 2018-03-17 DIAGNOSIS — E119 Type 2 diabetes mellitus without complications: Secondary | ICD-10-CM | POA: Diagnosis not present

## 2018-03-25 LAB — HM DIABETES EYE EXAM

## 2018-03-31 ENCOUNTER — Ambulatory Visit (INDEPENDENT_AMBULATORY_CARE_PROVIDER_SITE_OTHER): Payer: Medicare Other | Admitting: Family Medicine

## 2018-03-31 ENCOUNTER — Telehealth: Payer: Self-pay | Admitting: Family Medicine

## 2018-03-31 ENCOUNTER — Encounter: Payer: Self-pay | Admitting: Family Medicine

## 2018-03-31 VITALS — BP 118/68 | Ht 64.0 in | Wt 152.0 lb

## 2018-03-31 DIAGNOSIS — E7849 Other hyperlipidemia: Secondary | ICD-10-CM

## 2018-03-31 DIAGNOSIS — M5440 Lumbago with sciatica, unspecified side: Secondary | ICD-10-CM

## 2018-03-31 DIAGNOSIS — M791 Myalgia, unspecified site: Secondary | ICD-10-CM

## 2018-03-31 DIAGNOSIS — G8929 Other chronic pain: Secondary | ICD-10-CM

## 2018-03-31 MED ORDER — ATORVASTATIN CALCIUM 40 MG PO TABS: ORAL_TABLET | ORAL | 1 refills | Status: DC

## 2018-03-31 MED ORDER — HYDROCODONE-ACETAMINOPHEN 5-325 MG PO TABS: 1.0000 | ORAL_TABLET | Freq: Four times a day (QID) | ORAL | 0 refills | Status: DC | PRN

## 2018-03-31 NOTE — Telephone Encounter (Signed)
Patient was seen today and was told to call back with name of medication she might could try for her cholesterol.Ezetimive 10 mg tablets

## 2018-03-31 NOTE — Progress Notes (Signed)
   Subjective:    Patient ID: Elizabeth Aguilar, female    DOB: 07/16/1941, 77 y.o.   MRN: [id number]  HPI This patient was seen today for chronic pain  The medication list was reviewed and updated.   -Compliance with medication: yes  - Number patient states they take daily: up to four times a day as needed.   -when was the last dose patient took? yesterday  The patient was advised the importance of maintaining medication and not using illegal substances with these.  Here for refills and follow up  The patient was educated that we can provide 3 monthly scripts for their medication, it is their responsibility to follow the instructions.  Side effects or complications from medications: none  Patient is aware that pain medications are meant to minimize the severity of the pain to allow their pain levels to improve to allow for better function. They are aware of that pain medications cannot totally remove their pain.  Due for UDT ( at least once per year) : last one august 2018   Since increasing lipitor every bone in pt's body hurts.      Review of Systems  Constitutional: Negative for activity change and appetite change.  HENT: Negative for congestion.   Respiratory: Negative for cough.   Cardiovascular: Negative for chest pain.  Gastrointestinal: Negative for abdominal pain and vomiting.  Skin: Negative for color change.  Neurological: Negative for weakness.  Psychiatric/Behavioral: Negative for confusion.       Objective:   Physical Exam  Constitutional: She appears well-nourished. No distress.  HENT:  Head: Normocephalic.  Cardiovascular: Normal rate, regular rhythm and normal heart sounds.  No murmur heard. Pulmonary/Chest: Effort normal and breath sounds normal.  Musculoskeletal: She exhibits no edema.  Lymphadenopathy:    She has no cervical adenopathy.  Neurological: She is alert.  Psychiatric: Her behavior is normal.  Vitals reviewed.         Assessment  & Plan:  The patient was seen in followup for chronic pain. A review over at their current pain status was discussed. Discussion was held regarding the importance of compliance with medication as well as pain medication contract. Discussion with the patient regarding the medication as it pertains to allowing for blunting of pain levels as well as improvement of function was completed. Questions regarding the pain management were answered. Importance of regular followup visits was discussed. Patient was informed that medication may cause drowsiness and should not be combined  with other medications/alcohol or street drugs. Patient was cautioned that drowsiness may impair ability to operate heavy machinery/vehicles/dangerous activities and should take this into consideration.  Drug registry was checked unfortunately it was not functioning 3 prescriptions written  Patient relates significant myalgias related to the increased dose of the Lipitor therefore we will reduce it to 40 mg   She may mention of Zetia but previously it because caused cramping she will need to decide if she wants to try to get

## 2018-03-31 NOTE — Telephone Encounter (Signed)
Please let patient know that I did reduce her Lipitor.  I recommend 40 mg daily.  As for this medicine name of this medicine is Zetia-he generally does a very good job of lowering cholesterol.  It is used with the atorvastatin.  It should not cause any muscle pains.  It is not absorbed in the body the same way other medications are.  Years ago she tried this medicine and stated that it caused cramping.  These find out from the patient would she like to try this medication again?  In my opinion it is safe for her to use this medicine

## 2018-04-01 ENCOUNTER — Other Ambulatory Visit: Payer: Self-pay | Admitting: Family Medicine

## 2018-04-01 MED ORDER — EZETIMIBE 10 MG PO TABS: 10.0000 mg | ORAL_TABLET | Freq: Every day | ORAL | 5 refills | Status: DC

## 2018-04-01 NOTE — Telephone Encounter (Signed)
Patient states she would like to try it again.

## 2018-04-01 NOTE — Telephone Encounter (Signed)
Med sent in; pt notified

## 2018-04-01 NOTE — Progress Notes (Signed)
Addressed with the pt.See other note dated 04/01/2018.

## 2018-04-01 NOTE — Progress Notes (Signed)
Patient is aware 

## 2018-04-01 NOTE — Telephone Encounter (Signed)
zetia 10 mg, #30, 5 refills- this will flag as a allergy but in reality it is not an allergy it was a potential side effects she had it would be fine to try this medicine again

## 2018-04-08 ENCOUNTER — Telehealth: Payer: Self-pay | Admitting: Family Medicine

## 2018-04-08 DIAGNOSIS — E663 Overweight: Secondary | ICD-10-CM | POA: Diagnosis not present

## 2018-04-08 DIAGNOSIS — M461 Sacroiliitis, not elsewhere classified: Secondary | ICD-10-CM | POA: Diagnosis not present

## 2018-04-08 DIAGNOSIS — M150 Primary generalized (osteo)arthritis: Secondary | ICD-10-CM | POA: Diagnosis not present

## 2018-04-08 DIAGNOSIS — M2550 Pain in unspecified joint: Secondary | ICD-10-CM | POA: Diagnosis not present

## 2018-04-08 DIAGNOSIS — Z1589 Genetic susceptibility to other disease: Secondary | ICD-10-CM | POA: Diagnosis not present

## 2018-04-08 DIAGNOSIS — M1009 Idiopathic gout, multiple sites: Secondary | ICD-10-CM | POA: Diagnosis not present

## 2018-04-08 DIAGNOSIS — M545 Low back pain: Secondary | ICD-10-CM | POA: Diagnosis not present

## 2018-04-08 DIAGNOSIS — Z6827 Body mass index (BMI) 27.0-27.9, adult: Secondary | ICD-10-CM | POA: Diagnosis not present

## 2018-04-08 NOTE — Telephone Encounter (Signed)
I received a request for records from [facility] rheumatology.  This patient has voluminous amount of records both electronic and paper.  Erica-please clarify with and what they are desiring please send them necessary records but at the same time I do not feel we need to send every bit of records from the past multiple years

## 2018-04-14 ENCOUNTER — Encounter: Payer: Self-pay | Admitting: Family Medicine

## 2018-04-14 ENCOUNTER — Ambulatory Visit (INDEPENDENT_AMBULATORY_CARE_PROVIDER_SITE_OTHER): Payer: Medicare Other | Admitting: Family Medicine

## 2018-04-14 VITALS — BP 140/70 | Ht 64.0 in | Wt 153.0 lb

## 2018-04-14 DIAGNOSIS — N3000 Acute cystitis without hematuria: Secondary | ICD-10-CM

## 2018-04-14 DIAGNOSIS — R300 Dysuria: Secondary | ICD-10-CM

## 2018-04-14 LAB — POCT URINALYSIS DIPSTICK
Protein, UA: POSITIVE — AB
Spec Grav, UA: 1.01 (ref 1.010–1.025)
pH, UA: 5 (ref 5.0–8.0)

## 2018-04-14 MED ORDER — NITROFURANTOIN MONOHYD MACRO 100 MG PO CAPS: ORAL_CAPSULE | ORAL | 0 refills | Status: DC

## 2018-04-14 NOTE — Progress Notes (Signed)
   Subjective:    Patient ID: Elizabeth Aguilar, female    DOB: Sep 24, 1941, 77 y.o.   MRN: [id number]  HPI Patient is here today with complaints of a Uti.Patient reports pressure in abd and painful urination. This started on Saturday. She has not taken anything for this. incr frequencdy  Pos dysuria  Taking azo helps with the pain   No fever no holls      Review of Systems No headache, no major weight loss or weight gain, no chest pain no back pain abdominal pain no change in bowel habits complete ROS otherwise negative     Results for orders placed or performed in visit on 04/14/18  POCT urinalysis dipstick  Result Value Ref Range   Color, UA     Clarity, UA     Glucose, UA  Negative   Bilirubin, UA     Ketones, UA     Spec Grav, UA 1.010 1.010 - 1.025   Blood, UA Trace    pH, UA 5.0 5.0 - 8.0   Protein, UA Positive (A) Negative   Urobilinogen, UA  0.2 or 1.0 E.U./dL   Nitrite, UA Positve    Leukocytes, UA Large (3+) (A) Negative   Appearance     Odor      Objective:   Physical Exam  Alert vitals stable, NAD. Blood pressure good on repeat. HEENT normal. Lungs clear. Heart regular rate and rhythm.  No CVA tenderness 1 impression urinalysis numerous white blood cells per high-power field      Assessment & Plan:  1 impression urinary tract infection multiple questions answered.  Symptom care discussed.  Antibiotics prescribed.  Greater than 50% of this 15 minute face to face visit was spent in counseling and discussion and coordination of care regarding the above diagnosis/diagnosies  Urinary tract infection

## 2018-04-15 ENCOUNTER — Other Ambulatory Visit: Payer: Self-pay

## 2018-04-15 ENCOUNTER — Telehealth: Payer: Self-pay | Admitting: Family Medicine

## 2018-04-15 MED ORDER — IPRATROPIUM BROMIDE 0.06 % NA SOLN: 2.0000 | Freq: Four times a day (QID) | NASAL | 1 refills | Status: DC

## 2018-04-15 NOTE — Telephone Encounter (Signed)
It would be fine to do a 90-day supply with 1 additional refill

## 2018-04-15 NOTE — Telephone Encounter (Signed)
Prescription sent electronically to pharmacy. 

## 2018-04-15 NOTE — Addendum Note (Signed)
Addended by: Dairl Ponder on: 04/15/2018 04:27 PM   Modules accepted: Orders

## 2018-04-15 NOTE — Telephone Encounter (Signed)
Request for 90 day supply from pharmacy for Ipratropium 0.06% two sprays into both nostrils 4 times daily. Please advise. Last filled 03/01/2018

## 2018-04-17 LAB — SPECIMEN STATUS REPORT

## 2018-04-17 LAB — URINE CULTURE

## 2018-04-23 ENCOUNTER — Other Ambulatory Visit: Payer: Self-pay | Admitting: *Deleted

## 2018-04-23 ENCOUNTER — Telehealth: Payer: Self-pay | Admitting: Family Medicine

## 2018-04-23 MED ORDER — CEPHALEXIN 500 MG PO CAPS: 500.0000 mg | ORAL_CAPSULE | Freq: Four times a day (QID) | ORAL | 0 refills | Status: DC

## 2018-04-23 NOTE — Telephone Encounter (Signed)
Pt called back while I was with a patient. I called her back and left a message to return call

## 2018-04-23 NOTE — Telephone Encounter (Signed)
Patient said a nurse called her this week and was told she had e. Coli in her bladder.  She wanted to know if she needs to come back in and be rechecked or can Dr. Nicki Reaper call in more antibiotics?  She said she is having a lot of urinary frequency.  [facility]

## 2018-04-23 NOTE — Telephone Encounter (Signed)
Left message to return call 

## 2018-04-23 NOTE — Telephone Encounter (Signed)
It should be noted that this patient has had side effects or reactions to almost every single antibiotic category.  Based on what she is stating I think it is reasonable for her to try Keflex 500 mg 1 4 times daily for the next 5 days she should take this with a snack to lessen the chance of upset stomach please emphasized with the patient that there are limited choices of medicines that we could use based on her profile and that Keflex should be tolerable as long as she takes it with a snack-please notify us if any ongoing troubles, if she does not clear up with this she ought to do a follow-up office visit

## 2018-04-23 NOTE — Telephone Encounter (Signed)
Finished macrobid on June 9th. Pt is still having frequent urination and low abdominal pressure. No fever. No pain with urination, no blood in urine, no back pain.

## 2018-04-24 NOTE — Telephone Encounter (Signed)
Pt.notified

## 2018-04-30 ENCOUNTER — Other Ambulatory Visit: Payer: Self-pay | Admitting: Family Medicine

## 2018-05-15 ENCOUNTER — Ambulatory Visit (INDEPENDENT_AMBULATORY_CARE_PROVIDER_SITE_OTHER): Payer: Medicare Other | Admitting: Family Medicine

## 2018-05-15 ENCOUNTER — Encounter: Payer: Self-pay | Admitting: Family Medicine

## 2018-05-15 VITALS — BP 148/84 | Temp 98.7°F | Ht 64.0 in | Wt 153.0 lb

## 2018-05-15 DIAGNOSIS — R300 Dysuria: Secondary | ICD-10-CM

## 2018-05-15 DIAGNOSIS — N3000 Acute cystitis without hematuria: Secondary | ICD-10-CM | POA: Diagnosis not present

## 2018-05-15 LAB — POCT URINALYSIS DIPSTICK
Spec Grav, UA: <=1.005 — AB (ref 1.010–1.025)
pH, UA: 5 (ref 5.0–8.0)

## 2018-05-15 MED ORDER — NITROFURANTOIN MONOHYD MACRO 100 MG PO CAPS: 100.0000 mg | ORAL_CAPSULE | Freq: Two times a day (BID) | ORAL | 0 refills | Status: DC

## 2018-05-15 NOTE — Progress Notes (Signed)
   Subjective:    Patient ID: Elizabeth Aguilar, female    DOB: [birth year], 77 y.o.   MRN: [id number]   Dysuria    This is a new problem. Associated symptoms include frequency and urgency. Treatments tried: keflex, macrobid.   Results for orders placed or performed in visit on 05/15/18  POCT urinalysis dipstick  Result Value Ref Range   Color, UA     Clarity, UA     Glucose, UA  Negative   Bilirubin, UA     Ketones, UA     Spec Grav, UA <=1.005 (A) 1.010 - 1.025   Blood, UA     pH, UA 5.0 5.0 - 8.0   Protein, UA  Negative   Urobilinogen, UA  0.2 or 1.0 E.U./dL   Nitrite, UA +    Leukocytes, UA Moderate (2+) (A) Negative   Appearance     Odor     Patient returns for yet again potential urinary tract infection.  She notes frustration regarding this.  The recurrent nature.  No fever no chills no vomiting.  Took all her prior antibiotics  Review of Systems  Genitourinary: Positive for dysuria, frequency and urgency.       Objective:   Physical Exam  No true CVA tenderness  Urinalysis 6-8 white blood cells per high-power fieldAlert vitals stable, NAD. Blood pressure good on repeat. HEENT normal. Lungs clear. Heart regular rate and rhythm.       Assessment & Plan:  1 impression urinary tract infection antibiotics prescribed.  Symptom care discussed warning signs discussed culture done.  Recommend follow-up with Dr. Nicki Reaper for repeat urinalysis and culture and discussion following completion of medication

## 2018-05-17 LAB — URINE CULTURE

## 2018-05-17 LAB — SPECIMEN STATUS REPORT

## 2018-05-26 ENCOUNTER — Ambulatory Visit: Payer: Medicare Other | Admitting: Family Medicine

## 2018-06-02 ENCOUNTER — Encounter: Payer: Self-pay | Admitting: Family Medicine

## 2018-06-02 ENCOUNTER — Ambulatory Visit (INDEPENDENT_AMBULATORY_CARE_PROVIDER_SITE_OTHER): Payer: Medicare Other | Admitting: Family Medicine

## 2018-06-02 VITALS — BP 144/90 | Temp 97.9°F | Ht 64.0 in | Wt 157.0 lb

## 2018-06-02 DIAGNOSIS — E1121 Type 2 diabetes mellitus with diabetic nephropathy: Secondary | ICD-10-CM | POA: Diagnosis not present

## 2018-06-02 DIAGNOSIS — R300 Dysuria: Secondary | ICD-10-CM

## 2018-06-02 LAB — POCT URINALYSIS DIPSTICK
Leukocytes, UA: NEGATIVE
RBC UA: NEGATIVE
pH, UA: 6 (ref 5.0–8.0)

## 2018-06-02 NOTE — Patient Instructions (Addendum)
Urine looks good Please keep follow up appt in August  Ill send diabetic shoe info to [facility]

## 2018-06-02 NOTE — Addendum Note (Signed)
Addended by: Karle Barr on: 06/02/2018 05:03 PM   Modules accepted: Orders

## 2018-06-02 NOTE — Progress Notes (Signed)
   Subjective:    Patient ID: Elizabeth Aguilar, female    DOB: 03-02-1941, 77 y.o.   MRN: [id number]  HPI Patient is here today to recheck her urine.Patient states she is asymptomatic.  Recently treated UTI had some dysuria urinary frequency took antibiotics doing better now denies high fever chills sweats  Also has intermittent burning in the feet some tingling and numbness has diabetes.  Also has some foot deformity needs diabetic shoes Results for orders placed or performed in visit on 06/02/18  POCT urinalysis dipstick  Result Value Ref Range   Color, UA     Clarity, UA     Glucose, UA  Negative   Bilirubin, UA     Ketones, UA     Spec Grav, UA <=1.005 (A) 1.010 - 1.025   Blood, UA Negative    pH, UA 6.0 5.0 - 8.0   Protein, UA  Negative   Urobilinogen, UA  0.2 or 1.0 E.U./dL   Nitrite, UA     Leukocytes, UA Negative Negative   Appearance     Odor       Review of Systems  Constitutional: Negative for activity change and appetite change.  HENT: Negative for congestion and rhinorrhea.   Respiratory: Negative for cough and shortness of breath.   Cardiovascular: Negative for chest pain and leg swelling.  Gastrointestinal: Negative for abdominal pain, nausea and vomiting.  Genitourinary: Negative for difficulty urinating and dysuria.  Skin: Negative for color change.  Neurological: Negative for dizziness and weakness.  Psychiatric/Behavioral: Negative for agitation and confusion.       Objective:   Physical Exam  Constitutional: She appears well-developed.  HENT:  Head: Normocephalic.  Nose: Nose normal.  Neck: Neck supple.  Cardiovascular: Normal rate and normal heart sounds.  No murmur heard. Pulmonary/Chest: Effort normal and breath sounds normal. She has no wheezes.  Lymphadenopathy:    She has no cervical adenopathy.  Skin: Skin is warm and dry.  Nursing note and vitals reviewed.   Diabetic foot exam was completed and some minimal neuropathy in the feet along  with bunions and some calluses would benefit from diabetic shoes      Assessment & Plan:  Dysuria/uti resolved Send culture follow up in August  Patient to do lab work before visit in August Diabetes importance of healthy diet recommended along with taking the medication In addition to this diabetic shoes will be ordered through [employer]

## 2018-06-06 LAB — URINE CULTURE

## 2018-06-08 ENCOUNTER — Other Ambulatory Visit: Payer: Self-pay | Admitting: Family Medicine

## 2018-06-08 MED ORDER — CEPHALEXIN 250 MG PO CAPS: ORAL_CAPSULE | ORAL | 0 refills | Status: DC

## 2018-06-26 DIAGNOSIS — E7849 Other hyperlipidemia: Secondary | ICD-10-CM | POA: Diagnosis not present

## 2018-06-26 DIAGNOSIS — I10 Essential (primary) hypertension: Secondary | ICD-10-CM | POA: Diagnosis not present

## 2018-06-26 DIAGNOSIS — E1121 Type 2 diabetes mellitus with diabetic nephropathy: Secondary | ICD-10-CM | POA: Diagnosis not present

## 2018-06-27 LAB — BASIC METABOLIC PANEL
BUN / CREAT RATIO: 15 (ref 12–28)
BUN: 14 mg/dL (ref 8–27)
CO2: 25 mmol/L (ref 20–29)
CREATININE: 0.94 mg/dL (ref 0.57–1.00)
Calcium: 9.8 mg/dL (ref 8.7–10.3)
Chloride: 105 mmol/L (ref 96–106)
GFR, EST AFRICAN AMERICAN: 67 mL/min/{1.73_m2} (ref >59)
GFR, EST NON AFRICAN AMERICAN: 58 mL/min/{1.73_m2} — AB (ref >59)
Glucose: 105 mg/dL — ABNORMAL HIGH (ref 65–99)
Potassium: 4.8 mmol/L (ref 3.5–5.2)
SODIUM: 144 mmol/L (ref 134–144)

## 2018-06-27 LAB — HEMOGLOBIN A1C
Est. average glucose Bld gHb Est-mCnc: 134 mg/dL
Hgb A1c MFr Bld: 6.3 % — ABNORMAL HIGH (ref 4.8–5.6)

## 2018-06-27 LAB — LIPID PANEL
CHOLESTEROL TOTAL: 128 mg/dL (ref 100–199)
Chol/HDL Ratio: 2.5 ratio (ref 0.0–4.4)
HDL: 52 mg/dL (ref >39)
LDL Calculated: 62 mg/dL (ref 0–99)
Triglycerides: 71 mg/dL (ref 0–149)
VLDL CHOLESTEROL CAL: 14 mg/dL (ref 5–40)

## 2018-06-28 ENCOUNTER — Other Ambulatory Visit: Payer: Self-pay | Admitting: Family Medicine

## 2018-06-29 NOTE — Telephone Encounter (Signed)
Refill each x6 

## 2018-07-01 ENCOUNTER — Encounter: Payer: Self-pay | Admitting: Family Medicine

## 2018-07-01 ENCOUNTER — Ambulatory Visit (INDEPENDENT_AMBULATORY_CARE_PROVIDER_SITE_OTHER): Payer: Medicare Other | Admitting: Family Medicine

## 2018-07-01 VITALS — BP 138/76 | Ht 64.0 in | Wt 153.0 lb

## 2018-07-01 DIAGNOSIS — R300 Dysuria: Secondary | ICD-10-CM | POA: Diagnosis not present

## 2018-07-01 DIAGNOSIS — R935 Abnormal findings on diagnostic imaging of other abdominal regions, including retroperitoneum: Secondary | ICD-10-CM

## 2018-07-01 DIAGNOSIS — E7849 Other hyperlipidemia: Secondary | ICD-10-CM | POA: Diagnosis not present

## 2018-07-01 DIAGNOSIS — N390 Urinary tract infection, site not specified: Secondary | ICD-10-CM | POA: Diagnosis not present

## 2018-07-01 DIAGNOSIS — J3089 Other allergic rhinitis: Secondary | ICD-10-CM

## 2018-07-01 DIAGNOSIS — E1121 Type 2 diabetes mellitus with diabetic nephropathy: Secondary | ICD-10-CM | POA: Diagnosis not present

## 2018-07-01 DIAGNOSIS — I10 Essential (primary) hypertension: Secondary | ICD-10-CM

## 2018-07-01 DIAGNOSIS — E279 Disorder of adrenal gland, unspecified: Secondary | ICD-10-CM

## 2018-07-01 DIAGNOSIS — E278 Other specified disorders of adrenal gland: Secondary | ICD-10-CM

## 2018-07-01 LAB — POCT URINALYSIS DIPSTICK
Blood, UA: NEGATIVE
Nitrite, UA: POSITIVE
Spec Grav, UA: 1.025 (ref 1.010–1.025)
pH, UA: 5 (ref 5.0–8.0)

## 2018-07-01 MED ORDER — ALPRAZOLAM 0.5 MG PO TABS: ORAL_TABLET | ORAL | 5 refills | Status: DC

## 2018-07-01 MED ORDER — HYDROCODONE-ACETAMINOPHEN 5-325 MG PO TABS: 1.0000 | ORAL_TABLET | Freq: Four times a day (QID) | ORAL | 0 refills | Status: DC | PRN

## 2018-07-01 MED ORDER — CEPHALEXIN 500 MG PO CAPS: ORAL_CAPSULE | ORAL | 0 refills | Status: DC

## 2018-07-01 MED ORDER — AZELASTINE HCL 0.1 % NA SOLN: 2.0000 | Freq: Two times a day (BID) | NASAL | 12 refills | Status: DC

## 2018-07-01 NOTE — Addendum Note (Signed)
Addended by: Sallee Lange A on: 07/01/2018 11:15 PM   Modules accepted: Orders

## 2018-07-01 NOTE — Progress Notes (Addendum)
Subjective:    Patient ID: Elizabeth Aguilar, female    DOB: 1941-10-26, 77 y.o.   MRN: [id number]  HPI Patient is here today to follow up on her chronic health issues.Her last A1c was done 06/26/2018 at 6.3. She eats healthy and exercises and see a Rheumatologist and a Cardiologist.   This patient was seen today for chronic pain  The medication list was reviewed and updated.   -Compliance with medication: Patient is compliant  - Number patient states they take daily: Does take approximately 4/day  -when was the last dose patient took?  Earlier today  The patient was advised the importance of maintaining medication and not using illegal substances with these.  Here for refills and follow up  The patient was educated that we can provide 3 monthly scripts for their medication, it is their responsibility to follow the instructions.  Side effects or complications from medications: Denies any side effects  Patient is aware that pain medications are meant to minimize the severity of the pain to allow their pain levels to improve to allow for better function. They are aware of that pain medications cannot totally remove their pain.  Due for UDT ( at least once per year) : Later this year  Patient for blood pressure check up.  The patient does have hypertension.  The patient is on medication.  Patient relates compliance with meds. Todays BP reviewed with the patient. Patient denies issues with medication. Patient relates reasonable diet. Patient tries to minimize salt. Patient aware of BP goals.  Patient does have adrenal nodule is due for follow-up on CAT scan not having any abdominal pain     Also having some burning while urinating.Patient has had frequent UTIs taking medication currently finished about recently   Review of Systems  Constitutional: Negative for activity change, appetite change and fatigue.  HENT: Negative for congestion and rhinorrhea.   Respiratory: Negative for  cough and shortness of breath.   Cardiovascular: Negative for chest pain and leg swelling.  Gastrointestinal: Negative for abdominal pain and diarrhea.  Endocrine: Negative for polydipsia and polyphagia.  Skin: Negative for color change.  Neurological: Negative for dizziness and weakness.  Psychiatric/Behavioral: Negative for behavioral problems and confusion.   Results for orders placed or performed in visit on 07/01/18  POCT urinalysis dipstick  Result Value Ref Range   Color, UA     Clarity, UA     Glucose, UA     Bilirubin, UA     Ketones, UA     Spec Grav, UA 1.025 1.010 - 1.025   Blood, UA Negative    pH, UA 5.0 5.0 - 8.0   Protein, UA     Urobilinogen, UA     Nitrite, UA Positive    Leukocytes, UA Large (3+) (A) Negative   Appearance     Odor         Objective:   Physical Exam  Constitutional: She appears well-nourished. No distress.  HENT:  Head: Normocephalic and atraumatic.  Eyes: Right eye exhibits no discharge. Left eye exhibits no discharge.  Neck: No tracheal deviation present.  Cardiovascular: Normal rate, regular rhythm and normal heart sounds.  No murmur heard. Pulmonary/Chest: Effort normal and breath sounds normal. No respiratory distress.  Musculoskeletal: She exhibits no edema.  Lymphadenopathy:    She has no cervical adenopathy.  Neurological: She is alert. Coordination normal.  Skin: Skin is warm and dry.  Psychiatric: She has a normal mood and affect. Her  behavior is normal.  Vitals reviewed.  Diabetic foot exam was completed.  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  She also has some evidence of callus formation on both feet.  This puts her at risk of having increased pressure as well as ulcers she does in fact have diabetes and is under a comprehensive plan I believe she would benefit from diabetic shoes and inserts       Assessment & Plan:  Adrenal nodule follow-up CAT scan await the results Hyperlipidemia continue medication watch diet  closely Reflux good control continue current medication Blood pressure taking medicine watching diet continue medication Diabetes good control currently-taking medication tolerating well The patient was seen in followup for chronic pain. A review over at their current pain status was discussed. Drug registry was checked. Prescriptions were given. Discussion was held regarding the importance of compliance with medication as well as pain medication contract.  Time for questions regarding pain management plan occurred. Importance of regular followup visits was discussed. Patient was informed that medication may cause drowsiness and should not be combined  with other medications/alcohol or street drugs. Patient was cautioned that medication could cause drowsiness. If the patient feels medication is causing altered alertness then do not drive or operate dangerous equipment.  25 minutes was spent with the patient.  This statement verifies that 25 minutes was indeed spent with the patient.  More than 50% of this visit-total duration of the visit-was spent in counseling and coordination of care. The issues that the patient came in for today as reflected in the diagnosis (s) please refer to documentation for further details. UTI go ahead with Keflex again in the reculture the urine  Patient with allergic rhinitis issues she is interested in trying a different spray spray she has at home as well as loratadine do not help we will try Astelin  Diabetes under good control not on insulin may check once daily

## 2018-07-03 ENCOUNTER — Telehealth: Payer: Self-pay | Admitting: Family Medicine

## 2018-07-03 LAB — URINE CULTURE

## 2018-07-03 LAB — SPECIMEN STATUS REPORT

## 2018-07-03 NOTE — Telephone Encounter (Signed)
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

## 2018-07-05 ENCOUNTER — Encounter: Payer: Self-pay | Admitting: Family Medicine

## 2018-07-06 NOTE — Telephone Encounter (Signed)
Contacted patient and informed her that Medicare will pay to test once a day. Pt verbalized understanding. Scripts written out awaiting signature.

## 2018-07-20 ENCOUNTER — Ambulatory Visit (HOSPITAL_COMMUNITY)
Admission: RE | Admit: 2018-07-20 | Discharge: 2018-07-20 | Disposition: A | Discharge status: Home / Self Care | Payer: Medicare Other | Source: Ambulatory Visit | Attending: Family Medicine | Admitting: Family Medicine

## 2018-07-20 DIAGNOSIS — I700 Atherosclerosis of aorta: Secondary | ICD-10-CM | POA: Diagnosis not present

## 2018-07-20 DIAGNOSIS — K449 Diaphragmatic hernia without obstruction or gangrene: Secondary | ICD-10-CM | POA: Insufficient documentation

## 2018-07-20 DIAGNOSIS — E279 Disorder of adrenal gland, unspecified: Secondary | ICD-10-CM | POA: Insufficient documentation

## 2018-07-20 DIAGNOSIS — N8110 Cystocele, unspecified: Secondary | ICD-10-CM | POA: Diagnosis not present

## 2018-07-20 DIAGNOSIS — K8020 Calculus of gallbladder without cholecystitis without obstruction: Secondary | ICD-10-CM | POA: Diagnosis not present

## 2018-07-20 DIAGNOSIS — S3991XA Unspecified injury of abdomen, initial encounter: Secondary | ICD-10-CM | POA: Diagnosis not present

## 2018-07-20 MED ORDER — IOPAMIDOL (ISOVUE-300) INJECTION 61%
100.0000 mL | Freq: Once | INTRAVENOUS | Status: AC | PRN
  Administered 2018-07-20: 100 mL via INTRAVENOUS

## 2018-07-28 ENCOUNTER — Other Ambulatory Visit: Payer: Self-pay | Admitting: Family Medicine

## 2018-08-17 DIAGNOSIS — H25813 Combined forms of age-related cataract, bilateral: Secondary | ICD-10-CM | POA: Diagnosis not present

## 2018-08-17 DIAGNOSIS — E119 Type 2 diabetes mellitus without complications: Secondary | ICD-10-CM | POA: Diagnosis not present

## 2018-08-17 DIAGNOSIS — H25812 Combined forms of age-related cataract, left eye: Secondary | ICD-10-CM | POA: Diagnosis not present

## 2018-08-17 DIAGNOSIS — H35342 Macular cyst, hole, or pseudohole, left eye: Secondary | ICD-10-CM | POA: Diagnosis not present

## 2018-08-18 DIAGNOSIS — H43822 Vitreomacular adhesion, left eye: Secondary | ICD-10-CM | POA: Diagnosis not present

## 2018-08-18 DIAGNOSIS — H353112 Nonexudative age-related macular degeneration, right eye, intermediate dry stage: Secondary | ICD-10-CM | POA: Diagnosis not present

## 2018-08-18 DIAGNOSIS — H2513 Age-related nuclear cataract, bilateral: Secondary | ICD-10-CM | POA: Diagnosis not present

## 2018-08-18 DIAGNOSIS — H35342 Macular cyst, hole, or pseudohole, left eye: Secondary | ICD-10-CM | POA: Diagnosis not present

## 2018-08-27 ENCOUNTER — Other Ambulatory Visit: Payer: Self-pay | Admitting: Family Medicine

## 2018-09-11 NOTE — Patient Instructions (Signed)
Your procedure is scheduled on: 09/21/2018  Report to [facility] at  1100  AM.  Call this number if you have problems the morning of surgery: [phone]   Do not eat food or drink liquids :After Midnight.      Take these medicines the morning of surgery with A SIP OF WATER: Xanax ( if needed), nexium, hydrocodone ( if needed), lisinopril, metoprolol, zofran ( if needed).   Do not wear jewelry, make-up or nail polish.  Do not wear lotions, powders, or perfumes. You may wear deodorant.  Do not shave 48 hours prior to surgery.  Do not bring valuables to the hospital.  Contacts, dentures or bridgework may not be worn into surgery.  Leave suitcase in the car. After surgery it may be brought to your room.  For patients admitted to the hospital, checkout time is 11:00 AM the day of discharge.   Patients discharged the day of surgery will not be allowed to drive home.  :     Please read over the following fact sheets that you were given: Coughing and Deep Breathing, Surgical Site Infection Prevention, Anesthesia Post-op Instructions and Care and Recovery After Surgery    Cataract A cataract is a clouding of the lens of the eye. When a lens becomes cloudy, vision is reduced based on the degree and nature of the clouding. Many cataracts reduce vision to some degree. Some cataracts make people more near-sighted as they develop. Other cataracts increase glare. Cataracts that are ignored and become worse can sometimes look white. The white color can be seen through the pupil. CAUSES   Aging. However, cataracts may occur at any age, even in newborns.   Certain drugs.   Trauma to the eye.   Certain diseases such as diabetes.   Specific eye diseases such as chronic inflammation inside the eye or a sudden attack of a rare form of glaucoma.   Inherited or acquired medical problems.  SYMPTOMS   Gradual, progressive drop in vision in the affected eye.   Severe, rapid visual loss. This most often  happens when trauma is the cause.  DIAGNOSIS  To detect a cataract, an eye doctor examines the lens. Cataracts are best diagnosed with an exam of the eyes with the pupils enlarged (dilated) by drops.  TREATMENT  For an early cataract, vision may improve by using different eyeglasses or stronger lighting. If that does not help your vision, surgery is the only effective treatment. A cataract needs to be surgically removed when vision loss interferes with your everyday activities, such as driving, reading, or watching TV. A cataract may also have to be removed if it prevents examination or treatment of another eye problem. Surgery removes the cloudy lens and usually replaces it with a substitute lens (intraocular lens, IOL).  At a time when both you and your doctor agree, the cataract will be surgically removed. If you have cataracts in both eyes, only one is usually removed at a time. This allows the operated eye to heal and be out of danger from any possible problems after surgery (such as infection or poor wound healing). In rare cases, a cataract may be doing damage to your eye. In these cases, your caregiver may advise surgical removal right away. The vast majority of people who have cataract surgery have better vision afterward. HOME CARE INSTRUCTIONS  If you are not planning surgery, you may be asked to do the following:  Use different eyeglasses.   Use stronger or  brighter lighting.   Ask your eye doctor about reducing your medicine dose or changing medicines if it is thought that a medicine caused your cataract. Changing medicines does not make the cataract go away on its own.   Become familiar with your surroundings. Poor vision can lead to injury. Avoid bumping into things on the affected side. You are at a higher risk for tripping or falling.   Exercise extreme care when driving or operating machinery.   Wear sunglasses if you are sensitive to bright light or experiencing problems with  glare.  SEEK IMMEDIATE MEDICAL CARE IF:   You have a worsening or sudden vision loss.   You notice redness, swelling, or increasing pain in the eye.   You have a fever.  Document Released: 10/28/2005 Document Revised: 10/17/2011 Document Reviewed: 06/21/2011 [facility] Patient Information 2012 [place].PATIENT INSTRUCTIONS POST-ANESTHESIA  IMMEDIATELY FOLLOWING SURGERY:  Do not drive or operate machinery for the first twenty four hours after surgery.  Do not make any important decisions for twenty four hours after surgery or while taking narcotic pain medications or sedatives.  If you develop intractable nausea and vomiting or a severe headache please notify your doctor immediately.  FOLLOW-UP:  Please make an appointment with your surgeon as instructed. You do not need to follow up with anesthesia unless specifically instructed to do so.  WOUND CARE INSTRUCTIONS (if applicable):  Keep a dry clean dressing on the anesthesia/puncture wound site if there is drainage.  Once the wound has quit draining you may leave it open to air.  Generally you should leave the bandage intact for twenty four hours unless there is drainage.  If the epidural site drains for more than 36-48 hours please call the anesthesia department.  QUESTIONS?:  Please feel free to call your physician or the hospital operator if you have any questions, and they will be happy to assist you.

## 2018-09-12 DIAGNOSIS — Z23 Encounter for immunization: Secondary | ICD-10-CM | POA: Diagnosis not present

## 2018-09-17 ENCOUNTER — Encounter (HOSPITAL_COMMUNITY)
Admission: RE | Admit: 2018-09-17 | Discharge: 2018-09-17 | Disposition: A | Discharge status: Home / Self Care | Payer: Medicare Other | Source: Ambulatory Visit | Attending: Ophthalmology | Admitting: Ophthalmology

## 2018-09-17 ENCOUNTER — Encounter (HOSPITAL_COMMUNITY): Payer: Self-pay

## 2018-09-17 DIAGNOSIS — Z01812 Encounter for preprocedural laboratory examination: Secondary | ICD-10-CM | POA: Insufficient documentation

## 2018-09-17 LAB — HEMOGLOBIN A1C
Hgb A1c MFr Bld: 6.1 % — ABNORMAL HIGH (ref 4.8–5.6)
Mean Plasma Glucose: 128.37 mg/dL

## 2018-09-17 LAB — CBC WITH DIFFERENTIAL/PLATELET
Abs Immature Granulocytes: 0.02 10*3/uL (ref 0.00–0.07)
BASOS ABS: 0 10*3/uL (ref 0.0–0.1)
Basophils Relative: 1 %
EOS PCT: 2 %
Eosinophils Absolute: 0.2 10*3/uL (ref 0.0–0.5)
HEMATOCRIT: 39.4 % (ref 36.0–46.0)
HEMOGLOBIN: 12.2 g/dL (ref 12.0–15.0)
IMMATURE GRANULOCYTES: 0 %
LYMPHS ABS: 2.3 10*3/uL (ref 0.7–4.0)
LYMPHS PCT: 28 %
MCH: 27.9 pg (ref 26.0–34.0)
MCHC: 31 g/dL (ref 30.0–36.0)
MCV: 90 fL (ref 80.0–100.0)
MONOS PCT: 6 %
Monocytes Absolute: 0.5 10*3/uL (ref 0.1–1.0)
NRBC: 0 % (ref 0.0–0.2)
Neutro Abs: 5.2 10*3/uL (ref 1.7–7.7)
Neutrophils Relative %: 63 %
Platelets: 314 10*3/uL (ref 150–400)
RBC: 4.38 MIL/uL (ref 3.87–5.11)
RDW: 14.7 % (ref 11.5–15.5)
WBC: 8.3 10*3/uL (ref 4.0–10.5)

## 2018-09-17 LAB — GLUCOSE, CAPILLARY: GLUCOSE-CAPILLARY: 101 mg/dL — AB (ref 70–99)

## 2018-09-21 ENCOUNTER — Encounter (HOSPITAL_COMMUNITY)
Admission: RE | Disposition: A | Discharge status: Home / Self Care | Payer: Self-pay | Source: Ambulatory Visit | Attending: Ophthalmology

## 2018-09-21 ENCOUNTER — Ambulatory Visit (HOSPITAL_COMMUNITY): Payer: Medicare Other | Admitting: Anesthesiology

## 2018-09-21 ENCOUNTER — Telehealth: Payer: Self-pay | Admitting: Family Medicine

## 2018-09-21 ENCOUNTER — Encounter (HOSPITAL_COMMUNITY): Payer: Self-pay | Admitting: *Deleted

## 2018-09-21 ENCOUNTER — Ambulatory Visit (HOSPITAL_COMMUNITY)
Admission: RE | Admit: 2018-09-21 | Discharge: 2018-09-21 | Disposition: A | Discharge status: Home / Self Care | Payer: Medicare Other | Source: Ambulatory Visit | Attending: Ophthalmology | Admitting: Ophthalmology

## 2018-09-21 DIAGNOSIS — Z7982 Long term (current) use of aspirin: Secondary | ICD-10-CM | POA: Insufficient documentation

## 2018-09-21 DIAGNOSIS — I509 Heart failure, unspecified: Secondary | ICD-10-CM | POA: Insufficient documentation

## 2018-09-21 DIAGNOSIS — H25812 Combined forms of age-related cataract, left eye: Secondary | ICD-10-CM | POA: Insufficient documentation

## 2018-09-21 DIAGNOSIS — E119 Type 2 diabetes mellitus without complications: Secondary | ICD-10-CM | POA: Diagnosis not present

## 2018-09-21 DIAGNOSIS — Z79899 Other long term (current) drug therapy: Secondary | ICD-10-CM | POA: Insufficient documentation

## 2018-09-21 DIAGNOSIS — Z7984 Long term (current) use of oral hypoglycemic drugs: Secondary | ICD-10-CM | POA: Diagnosis not present

## 2018-09-21 DIAGNOSIS — I2510 Atherosclerotic heart disease of native coronary artery without angina pectoris: Secondary | ICD-10-CM | POA: Diagnosis not present

## 2018-09-21 DIAGNOSIS — I110 Hypertensive heart disease with heart failure: Secondary | ICD-10-CM | POA: Diagnosis not present

## 2018-09-21 DIAGNOSIS — I252 Old myocardial infarction: Secondary | ICD-10-CM | POA: Diagnosis not present

## 2018-09-21 DIAGNOSIS — K219 Gastro-esophageal reflux disease without esophagitis: Secondary | ICD-10-CM | POA: Diagnosis not present

## 2018-09-21 DIAGNOSIS — I10 Essential (primary) hypertension: Secondary | ICD-10-CM | POA: Diagnosis not present

## 2018-09-21 HISTORY — PX: CATARACT EXTRACTION W/PHACO: SHX586

## 2018-09-21 LAB — GLUCOSE, CAPILLARY: GLUCOSE-CAPILLARY: 88 mg/dL (ref 70–99)

## 2018-09-21 SURGERY — PHACOEMULSIFICATION, CATARACT, WITH IOL INSERTION
Anesthesia: Monitor Anesthesia Care | Site: Eye | Laterality: Left

## 2018-09-21 MED ORDER — EPINEPHRINE PF 1 MG/ML IJ SOLN
INTRAOCULAR | Status: DC | PRN
  Administered 2018-09-21: 500 mL

## 2018-09-21 MED ORDER — PROVISC 10 MG/ML IO SOLN
INTRAOCULAR | Status: DC | PRN
  Administered 2018-09-21: 0.85 mL via INTRAOCULAR

## 2018-09-21 MED ORDER — METFORMIN HCL 500 MG PO TABS: ORAL_TABLET | ORAL | 1 refills | Status: DC

## 2018-09-21 MED ORDER — BSS IO SOLN
INTRAOCULAR | Status: DC | PRN
  Administered 2018-09-21: 15 mL via INTRAOCULAR

## 2018-09-21 MED ORDER — LIDOCAINE HCL (PF) 1 % IJ SOLN
INTRAMUSCULAR | Status: DC | PRN
  Administered 2018-09-21: .4 mL

## 2018-09-21 MED ORDER — POVIDONE-IODINE 5 % OP SOLN
OPHTHALMIC | Status: DC | PRN
  Administered 2018-09-21: 1 via OPHTHALMIC

## 2018-09-21 MED ORDER — PHENYLEPHRINE HCL 2.5 % OP SOLN
1.0000 [drp] | OPHTHALMIC | Status: AC | PRN
  Administered 2018-09-21 (×3): 1 [drp] via OPHTHALMIC

## 2018-09-21 MED ORDER — MIDAZOLAM HCL 5 MG/5ML IJ SOLN
INTRAMUSCULAR | Status: DC | PRN
  Administered 2018-09-21: 0.5 mg via INTRAVENOUS

## 2018-09-21 MED ORDER — CYCLOPENTOLATE-PHENYLEPHRINE 0.2-1 % OP SOLN
1.0000 [drp] | OPHTHALMIC | Status: AC | PRN
  Administered 2018-09-21 (×3): 1 [drp] via OPHTHALMIC

## 2018-09-21 MED ORDER — LACTATED RINGERS IV SOLN
INTRAVENOUS | Status: DC
  Administered 2018-09-21: 12:00:00 via INTRAVENOUS

## 2018-09-21 MED ORDER — LIDOCAINE HCL 3.5 % OP GEL
1.0000 "application " | Freq: Once | OPHTHALMIC | Status: AC
  Administered 2018-09-21: 1 via OPHTHALMIC

## 2018-09-21 MED ORDER — TETRACAINE HCL 0.5 % OP SOLN
1.0000 [drp] | OPHTHALMIC | Status: AC | PRN
  Administered 2018-09-21 (×3): 1 [drp] via OPHTHALMIC

## 2018-09-21 MED ORDER — NEOMYCIN-POLYMYXIN-DEXAMETH 3.5-10000-0.1 OP SUSP
OPHTHALMIC | Status: DC | PRN
  Administered 2018-09-21: 2 [drp] via OPHTHALMIC

## 2018-09-21 SURGICAL SUPPLY — 12 items
CLOTH BEACON ORANGE TIMEOUT ST (SAFETY) ×2 IMPLANT
EYE SHIELD UNIVERSAL CLEAR (GAUZE/BANDAGES/DRESSINGS) ×2 IMPLANT
GLOVE BIOGEL PI IND STRL 7.0 (GLOVE) IMPLANT
GLOVE BIOGEL PI INDICATOR 7.0 (GLOVE) ×4
LENS ALC ACRYL/TECN (Ophthalmic Related) ×2 IMPLANT
NDL HYPO 18GX1.5 BLUNT FILL (NEEDLE) IMPLANT
NEEDLE HYPO 18GX1.5 BLUNT FILL (NEEDLE) ×3 IMPLANT
PAD ARMBOARD 7.5X6 YLW CONV (MISCELLANEOUS) ×2 IMPLANT
SYRINGE LUER LOK 1CC (MISCELLANEOUS) ×2 IMPLANT
TAPE SURG TRANSPORE 1 IN (GAUZE/BANDAGES/DRESSINGS) IMPLANT
TAPE SURGICAL TRANSPORE 1 IN (GAUZE/BANDAGES/DRESSINGS) ×2
WATER STERILE IRR 250ML POUR (IV SOLUTION) ×2 IMPLANT

## 2018-09-21 NOTE — Op Note (Signed)
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  Assistants: None  Anesthesia: Topical with MAC  Indications: Painless, progressive loss of vision with compromise of daily activities.  Surgery: Cataract Extraction with Intraocular lens Implant Left Eye  Discription: The patient had dilating drops and viscous lidocaine placed into the Left eye in the pre-op holding area. After transfer to the operating room, a time out was performed. The patient was then prepped and draped. Beginning with a 22m blade a paracentesis port was made at the surgeon's 2 o'clock position. The anterior chamber was then filled with 1% non-preserved lidocaine. This was followed by filling the anterior chamber with Provisc.  A 2.445mkeratome blade was used to make a clear corneal incision at the temporal limbus.  A bent cystatome needle was used to create a continuous tear capsulotomy. Hydrodissection was performed with balanced salt solution on a Fine canula. The lens nucleus was then removed using the phacoemulsification handpiece. Residual cortex was removed with the I&A handpiece. The anterior chamber and capsular bag were refilled with Provisc. A posterior chamber intraocular lens was placed into the capsular bag with it's injector. The implant was positioned with the Kuglan hook. The Provisc was then removed from the anterior chamber and capsular bag with the I&A handpiece. Stromal hydration of the main incision and paracentesis port was performed with BSS on a Fine canula. The wounds were tested for leak which was negative. The patient tolerated the procedure well. There were no operative complications. The patient was then transferred to the recovery room in stable condition.  Complications: None  Specimen: None  EBL: None  Prosthetic device: [employer] Technis, PCB00, power 19.5, SN  [id number]

## 2018-09-21 NOTE — Anesthesia Preprocedure Evaluation (Signed)
Anesthesia Evaluation    Airway Mallampati: II     Mouth opening: Limited Mouth Opening  Dental  (+) Upper Dentures   Pulmonary    breath sounds clear to auscultation       Cardiovascular hypertension, + CAD, + Past MI and +CHF   Rhythm:regular     Neuro/Psych    GI/Hepatic GERD  Medicated,  Endo/Other  diabetes, Type 2  Renal/GU CRF     Musculoskeletal   Abdominal   Peds  Hematology   Anesthesia Other Findings Cardiac stents. Limited physical activity   Reproductive/Obstetrics                             Anesthesia Physical Anesthesia Plan  ASA: IV  Anesthesia Plan: MAC   Post-op Pain Management:    Induction:   PONV Risk Score and Plan:   Airway Management Planned:   Additional Equipment:   Intra-op Plan:   Post-operative Plan:   Informed Consent:   Plan Discussed with: Anesthesiologist  Anesthesia Plan Comments:         Anesthesia Quick Evaluation

## 2018-09-21 NOTE — Telephone Encounter (Signed)
Pt last seen for diabetes 07/01/18. Her med list states to take metformin 500mg  one daily but pt states she increased herself to one bid because her sugar was high. States she told dr Nicki Reaper at her visit she increased to one bid. Sugar now with taking one bid is sometimes in the 150's to the 170's. Pt completely out of med and would like a 90 day supply #180.

## 2018-09-21 NOTE — Anesthesia Postprocedure Evaluation (Signed)
Anesthesia Post Note  Patient: Elizabeth Aguilar  Procedure(s) Performed: CATARACT EXTRACTION PHACO AND INTRAOCULAR LENS PLACEMENT LEFT EYE (Left Eye)  Patient location during evaluation: Short Stay Anesthesia Type: MAC Level of consciousness: awake and alert and oriented Pain management: pain level controlled Vital Signs Assessment: post-procedure vital signs reviewed and stable Respiratory status: spontaneous breathing Cardiovascular status: stable Postop Assessment: no apparent nausea or vomiting Anesthetic complications: no     Last Vitals:  Vitals:   09/21/18 1138  Pulse: 77  Resp: 18  Temp: 36.7 C  SpO2: 98%    Last Pain:  Vitals:   09/21/18 1138  TempSrc: Oral  PainSc: 0-No pain                 ADAMS, AMY A

## 2018-09-21 NOTE — H&P (Signed)
I have reviewed the H&P, the patient was re-examined, and I have identified no interval changes in medical condition and plan of care since the history and physical of record  

## 2018-09-21 NOTE — Telephone Encounter (Signed)
pts is requesting a refill on metFORMIN (GLUCOPHAGE) 500 MG tablet. Also please update directions. She is taking it twice a day and would like a 90 day supply. Pt is out of the medication. Please send to [place], [name]

## 2018-09-21 NOTE — Telephone Encounter (Signed)
Medication sent in with updated directions. Pt is aware.

## 2018-09-21 NOTE — Transfer of Care (Signed)
Immediate Anesthesia Transfer of Care Note  Patient: Elizabeth Aguilar  Procedure(s) Performed: CATARACT EXTRACTION PHACO AND INTRAOCULAR LENS PLACEMENT LEFT EYE (Left Eye)  Patient Location: Short Stay  Anesthesia Type:MAC  Level of Consciousness: awake, alert , oriented and patient cooperative  Airway & Oxygen Therapy: Patient Spontanous Breathing  Post-op Assessment: Report given to RN and Post -op Vital signs reviewed and stable  Post vital signs: Reviewed and stable  Last Vitals:  Vitals Value Taken Time  BP    Temp    Pulse    Resp    SpO2      Last Pain:  Vitals:   09/21/18 1138  TempSrc: Oral  PainSc: 0-No pain      Patients Stated Pain Goal: 5 ([id number])  Complications: No apparent anesthesia complications

## 2018-09-21 NOTE — Anesthesia Procedure Notes (Signed)
Procedure Name: MAC Date/Time: 09/21/2018 12:14 PM Performed by: Andree Elk Smith Potenza A, CRNA Pre-anesthesia Checklist: Patient identified, Emergency Drugs available, Suction available, Timeout performed and Patient being monitored Patient Re-evaluated:Patient Re-evaluated prior to induction Oxygen Delivery Method: Nasal Cannula

## 2018-09-21 NOTE — Telephone Encounter (Signed)
Ok six mo worth at new dose

## 2018-09-22 ENCOUNTER — Encounter (HOSPITAL_COMMUNITY): Payer: Self-pay | Admitting: Ophthalmology

## 2018-09-24 ENCOUNTER — Other Ambulatory Visit: Payer: Self-pay | Admitting: Family Medicine

## 2018-09-27 DIAGNOSIS — H2511 Age-related nuclear cataract, right eye: Secondary | ICD-10-CM | POA: Diagnosis not present

## 2018-10-01 ENCOUNTER — Encounter (HOSPITAL_COMMUNITY)
Admission: RE | Admit: 2018-10-01 | Discharge: 2018-10-01 | Disposition: A | Discharge status: Home / Self Care | Payer: Medicare Other | Source: Ambulatory Visit | Attending: Ophthalmology | Admitting: Ophthalmology

## 2018-10-01 ENCOUNTER — Ambulatory Visit (INDEPENDENT_AMBULATORY_CARE_PROVIDER_SITE_OTHER): Payer: Medicare Other | Admitting: Family Medicine

## 2018-10-01 VITALS — BP 132/82 | Ht 64.0 in | Wt 150.2 lb

## 2018-10-01 DIAGNOSIS — L989 Disorder of the skin and subcutaneous tissue, unspecified: Secondary | ICD-10-CM

## 2018-10-01 DIAGNOSIS — E1121 Type 2 diabetes mellitus with diabetic nephropathy: Secondary | ICD-10-CM

## 2018-10-01 DIAGNOSIS — E7849 Other hyperlipidemia: Secondary | ICD-10-CM | POA: Diagnosis not present

## 2018-10-01 DIAGNOSIS — I10 Essential (primary) hypertension: Secondary | ICD-10-CM

## 2018-10-01 MED ORDER — HYDROCODONE-ACETAMINOPHEN 5-325 MG PO TABS: ORAL_TABLET | ORAL | 0 refills | Status: DC

## 2018-10-01 MED ORDER — HYDROCODONE-ACETAMINOPHEN 5-325 MG PO TABS: 1.0000 | ORAL_TABLET | Freq: Four times a day (QID) | ORAL | 0 refills | Status: DC | PRN

## 2018-10-01 MED ORDER — ALPRAZOLAM 0.5 MG PO TABS: 0.5000 mg | ORAL_TABLET | Freq: Two times a day (BID) | ORAL | 5 refills | Status: DC | PRN

## 2018-10-01 NOTE — Progress Notes (Signed)
Subjective:    Patient ID: Elizabeth Aguilar, female    DOB: [birth year], 77 y.o.   MRN: [id number]  Diabetes  She presents for her follow-up diabetic visit. She has type 2 diabetes mellitus. Pertinent negatives for hypoglycemia include no confusion or dizziness. Pertinent negatives for diabetes include no chest pain, no fatigue, no polydipsia, no polyphagia and no weakness. Risk factors for coronary artery disease include diabetes mellitus, dyslipidemia, hypertension and post-menopausal. Current diabetic treatment includes oral agent (monotherapy). She is compliant with treatment all of the time. Her weight is stable. She is following a diabetic diet. She has not had a previous visit with a dietitian. She does not see a podiatrist.Eye exam is current.   HgbA1c 6.1 via preop blood work done at hospital 2 weeks ago This patient was seen today for chronic pain  The medication list was reviewed and updated.   -Compliance with medication: Relates compliance  - Number patient states they take daily: Takes anywhere between 2 and 4 daily  -when was the last dose patient took?  Earlier today  The patient was advised the importance of maintaining medication and not using illegal substances with these.  Here for refills and follow up  The patient was educated that we can provide 3 monthly scripts for their medication, it is their responsibility to follow the instructions.  Side effects or complications from medications: Denies side effects  Patient is aware that pain medications are meant to minimize the severity of the pain to allow their pain levels to improve to allow for better function. They are aware of that pain medications cannot totally remove their pain.  Due for UDT ( at least once per year) : On next visit  Skin lesion right lower leg present for the past few weeks  Patient does have ongoing trouble with reflux.  Takes medication on a regular basis.  Tries to minimize foods as best  they can.  They understand the importance of dietary compliance.  May also try to avoid eating a large meal close to bedtime.  Patient denies any dysphagia denies hematochezia.  States medicine does a good job keeping the problem under good control.  Without the medication may certainly have issues.They desire to continue taking their medication.  Patient here for follow-up regarding cholesterol.  The patient does have hyperlipidemia.  Patient does try to maintain a reasonable diet.  Patient does take the medication on a regular basis.  Denies missing a dose.  The patient denies any obvious side effects.  Prior blood work results reviewed with the patient.  The patient is aware of his cholesterol goals and the need to keep it under good control to lessen the risk of disease.    Patient for blood pressure check up.  The patient does have hypertension.  The patient is on medication.  Patient relates compliance with meds. Todays BP reviewed with the patient. Patient denies issues with medication. Patient relates reasonable diet. Patient tries to minimize salt. Patient aware of BP goals.   Review of Systems  Constitutional: Negative for activity change, appetite change and fatigue.  HENT: Negative for congestion and rhinorrhea.   Respiratory: Negative for cough and shortness of breath.   Cardiovascular: Negative for chest pain and leg swelling.  Gastrointestinal: Negative for abdominal pain and diarrhea.  Endocrine: Negative for polydipsia and polyphagia.  Skin: Negative for color change.  Neurological: Negative for dizziness and weakness.  Psychiatric/Behavioral: Negative for behavioral problems and confusion.  Objective:   Physical Exam  Constitutional: She appears well-nourished. No distress.  HENT:  Head: Normocephalic and atraumatic.  Eyes: Right eye exhibits no discharge. Left eye exhibits no discharge.  Neck: No tracheal deviation present.  Cardiovascular: Normal rate, regular  rhythm and normal heart sounds.  No murmur heard. Pulmonary/Chest: Effort normal and breath sounds normal. No respiratory distress.  Musculoskeletal: She exhibits no edema.  Lymphadenopathy:    She has no cervical adenopathy.  Neurological: She is alert. Coordination normal.  Skin: Skin is warm and dry.  Psychiatric: She has a normal mood and affect. Her behavior is normal.  Vitals reviewed.  Diabetic foot exam subjective numbness in her toes as well as some bunion changes and calluses.  Skin lesion noted on the right leg patient concerned about the possibility of psoriasis I believe it should be checked by dermatology to make sure it is not precancerous lesion     Assessment & Plan:  The patient was seen today as part of a comprehensive visit for diabetes. The importance of keeping her A1c at or below 7 was discussed.  Importance of regular physical activity was discussed.   The importance of adherence to medication as well as a controlled low starch/sugar diet was also discussed.  Standard follow-up visit recommended.  Also patient aware failure to keep diabetes under control increases the risk of complications.  HTN- Patient was seen today as part of a visit regarding hypertension. The importance of healthy diet and regular physical activity was discussed. The importance of compliance with medications discussed.  Ideal goal is to keep blood pressure low elevated levels certainly below 100/71 when possible.  The patient was counseled that keeping blood pressure under control lessen his risk of complications.  The importance of regular follow-ups was discussed with the patient.  Low-salt diet such as DASH recommended.  Regular physical activity was recommended as well.  Patient was advised to keep regular follow-ups.  The patient was seen in followup for chronic pain. A review over at their current pain status was discussed. Drug registry was checked. Prescriptions were  given. Discussion was held regarding the importance of compliance with medication as well as pain medication contract.  Time for questions regarding pain management plan occurred. Importance of regular followup visits was discussed. Patient was informed that medication may cause drowsiness and should not be combined  with other medications/alcohol or street drugs. Patient was cautioned that medication could cause drowsiness. If the patient feels medication is causing altered alertness then do not drive or operate dangerous equipment.  Scripts were sent in electronically drug registry was checked  The patient was seen today for GERD. Patient benefits from medication. Patient to continue medication. Keep all regular follow ups.  25 minutes was spent with the patient.  This statement verifies that 25 minutes was indeed spent with the patient.  More than 50% of this visit-total duration of the visit-was spent in counseling and coordination of care. The issues that the patient came in for today as reflected in the diagnosis (s) please refer to documentation for further details.  Previous labs reviewed with the patient additional labs ordered

## 2018-10-05 ENCOUNTER — Ambulatory Visit (HOSPITAL_COMMUNITY): Payer: Medicare Other | Admitting: Anesthesiology

## 2018-10-05 ENCOUNTER — Encounter (HOSPITAL_COMMUNITY): Payer: Self-pay

## 2018-10-05 ENCOUNTER — Encounter (HOSPITAL_COMMUNITY)
Admission: RE | Disposition: A | Discharge status: Home / Self Care | Payer: Self-pay | Source: Ambulatory Visit | Attending: Ophthalmology

## 2018-10-05 ENCOUNTER — Ambulatory Visit (HOSPITAL_COMMUNITY)
Admission: RE | Admit: 2018-10-05 | Discharge: 2018-10-05 | Disposition: A | Discharge status: Home / Self Care | Payer: Medicare Other | Source: Ambulatory Visit | Attending: Ophthalmology | Admitting: Ophthalmology

## 2018-10-05 DIAGNOSIS — N289 Disorder of kidney and ureter, unspecified: Secondary | ICD-10-CM | POA: Diagnosis not present

## 2018-10-05 DIAGNOSIS — H2511 Age-related nuclear cataract, right eye: Secondary | ICD-10-CM | POA: Insufficient documentation

## 2018-10-05 DIAGNOSIS — I2510 Atherosclerotic heart disease of native coronary artery without angina pectoris: Secondary | ICD-10-CM | POA: Insufficient documentation

## 2018-10-05 DIAGNOSIS — I252 Old myocardial infarction: Secondary | ICD-10-CM | POA: Insufficient documentation

## 2018-10-05 DIAGNOSIS — Z955 Presence of coronary angioplasty implant and graft: Secondary | ICD-10-CM | POA: Diagnosis not present

## 2018-10-05 DIAGNOSIS — E119 Type 2 diabetes mellitus without complications: Secondary | ICD-10-CM | POA: Diagnosis not present

## 2018-10-05 DIAGNOSIS — I10 Essential (primary) hypertension: Secondary | ICD-10-CM | POA: Diagnosis not present

## 2018-10-05 DIAGNOSIS — K219 Gastro-esophageal reflux disease without esophagitis: Secondary | ICD-10-CM | POA: Diagnosis not present

## 2018-10-05 DIAGNOSIS — I110 Hypertensive heart disease with heart failure: Secondary | ICD-10-CM | POA: Diagnosis not present

## 2018-10-05 HISTORY — PX: CATARACT EXTRACTION W/PHACO: SHX586

## 2018-10-05 LAB — GLUCOSE, CAPILLARY: Glucose-Capillary: 85 mg/dL (ref 70–99)

## 2018-10-05 SURGERY — PHACOEMULSIFICATION, CATARACT, WITH IOL INSERTION
Anesthesia: Monitor Anesthesia Care | Site: Eye | Laterality: Right

## 2018-10-05 MED ORDER — PROVISC 10 MG/ML IO SOLN
INTRAOCULAR | Status: DC | PRN
  Administered 2018-10-05: 0.85 mL via INTRAOCULAR

## 2018-10-05 MED ORDER — MIDAZOLAM HCL 2 MG/2ML IJ SOLN
INTRAMUSCULAR | Status: DC | PRN
  Administered 2018-10-05: .5 mg via INTRAVENOUS

## 2018-10-05 MED ORDER — LIDOCAINE HCL 3.5 % OP GEL
1.0000 "application " | Freq: Once | OPHTHALMIC | Status: AC
  Administered 2018-10-05: 1 via OPHTHALMIC

## 2018-10-05 MED ORDER — CYCLOPENTOLATE-PHENYLEPHRINE 0.2-1 % OP SOLN
1.0000 [drp] | OPHTHALMIC | Status: AC
  Administered 2018-10-05 (×3): 1 [drp] via OPHTHALMIC

## 2018-10-05 MED ORDER — LACTATED RINGERS IV SOLN
INTRAVENOUS | Status: DC
  Administered 2018-10-05: 09:00:00 via INTRAVENOUS

## 2018-10-05 MED ORDER — BSS IO SOLN
INTRAOCULAR | Status: DC | PRN
  Administered 2018-10-05: 15 mL

## 2018-10-05 MED ORDER — PHENYLEPHRINE HCL 2.5 % OP SOLN
1.0000 [drp] | OPHTHALMIC | Status: AC
  Administered 2018-10-05 (×3): 1 [drp] via OPHTHALMIC

## 2018-10-05 MED ORDER — NEOMYCIN-POLYMYXIN-DEXAMETH 3.5-10000-0.1 OP SUSP
OPHTHALMIC | Status: DC | PRN
  Administered 2018-10-05: 2 [drp] via OPHTHALMIC

## 2018-10-05 MED ORDER — TETRACAINE HCL 0.5 % OP SOLN
1.0000 [drp] | OPHTHALMIC | Status: AC
  Administered 2018-10-05 (×3): 1 [drp] via OPHTHALMIC

## 2018-10-05 MED ORDER — MIDAZOLAM HCL 2 MG/2ML IJ SOLN
INTRAMUSCULAR | Status: AC
  Filled 2018-10-05: qty 2

## 2018-10-05 MED ORDER — LIDOCAINE HCL (PF) 1 % IJ SOLN
INTRAMUSCULAR | Status: DC | PRN
  Administered 2018-10-05: .5 mL

## 2018-10-05 MED ORDER — POVIDONE-IODINE 5 % OP SOLN
OPHTHALMIC | Status: DC | PRN
  Administered 2018-10-05: 1 via OPHTHALMIC

## 2018-10-05 MED ORDER — EPINEPHRINE PF 1 MG/ML IJ SOLN
INTRAOCULAR | Status: DC | PRN
  Administered 2018-10-05: 500 mL

## 2018-10-05 SURGICAL SUPPLY — 10 items
CLOTH BEACON ORANGE TIMEOUT ST (SAFETY) ×2 IMPLANT
EYE SHIELD UNIVERSAL CLEAR (GAUZE/BANDAGES/DRESSINGS) ×2 IMPLANT
GLOVE BIOGEL PI IND STRL 6.5 (GLOVE) IMPLANT
GLOVE BIOGEL PI INDICATOR 6.5 (GLOVE) ×4
LENS ALC ACRYL/TECN (Ophthalmic Related) ×2 IMPLANT
PAD ARMBOARD 7.5X6 YLW CONV (MISCELLANEOUS) ×2 IMPLANT
SYRINGE LUER LOK 1CC (MISCELLANEOUS) ×2 IMPLANT
TAPE SURG TRANSPORE 1 IN (GAUZE/BANDAGES/DRESSINGS) IMPLANT
TAPE SURGICAL TRANSPORE 1 IN (GAUZE/BANDAGES/DRESSINGS) ×2
WATER STERILE IRR 250ML POUR (IV SOLUTION) ×2 IMPLANT

## 2018-10-05 NOTE — Anesthesia Preprocedure Evaluation (Signed)
Anesthesia Evaluation  Patient identified by MRN, date of birth, ID band Patient awake    Reviewed: Allergy & Precautions, H&P , NPO status , Patient's Chart, lab work & pertinent test results, reviewed documented beta blocker date and time   Airway Mallampati: II  TM Distance: >3 FB Neck ROM: full  Mouth opening: Limited Mouth Opening  Dental no notable dental hx. (+) Upper Dentures   Pulmonary neg pulmonary ROS,    Pulmonary exam normal breath sounds clear to auscultation       Cardiovascular Exercise Tolerance: Good hypertension, + CAD, + Past MI and +CHF  negative cardio ROS   Rhythm:regular Rate:Normal     Neuro/Psych negative neurological ROS  negative psych ROS   GI/Hepatic negative GI ROS, Neg liver ROS, GERD  Medicated,  Endo/Other  negative endocrine ROSdiabetes, Type 2  Renal/GU Renal disease  negative genitourinary   Musculoskeletal   Abdominal   Peds  Hematology negative hematology ROS (+)   Anesthesia Other Findings Cardiac stents. Limited physical activity   Reproductive/Obstetrics negative OB ROS                             Anesthesia Physical  Anesthesia Plan  ASA: IV  Anesthesia Plan: MAC   Post-op Pain Management:    Induction:   PONV Risk Score and Plan:   Airway Management Planned:   Additional Equipment:   Intra-op Plan:   Post-operative Plan:   Informed Consent: I have reviewed the patients History and Physical, chart, labs and discussed the procedure including the risks, benefits and alternatives for the proposed anesthesia with the patient or authorized representative who has indicated his/her understanding and acceptance.   Dental Advisory Given  Plan Discussed with: Anesthesiologist  Anesthesia Plan Comments:         Anesthesia Quick Evaluation

## 2018-10-05 NOTE — Anesthesia Procedure Notes (Signed)
Procedure Name: MAC Date/Time: 10/05/2018 9:33 AM Performed by: Vista Deck, CRNA Pre-anesthesia Checklist: Patient identified, Emergency Drugs available, Suction available, Timeout performed and Patient being monitored Patient Re-evaluated:Patient Re-evaluated prior to induction Oxygen Delivery Method: Nasal Cannula

## 2018-10-05 NOTE — H&P (Signed)
I have reviewed the H&P, the patient was re-examined, and I have identified no interval changes in medical condition and plan of care since the history and physical of record  

## 2018-10-05 NOTE — Anesthesia Postprocedure Evaluation (Signed)
Anesthesia Post Note  Patient: Elizabeth Aguilar  Procedure(s) Performed: CATARACT EXTRACTION PHACO AND INTRAOCULAR LENS PLACEMENT ([place]) (Right Eye)  Patient location during evaluation: Short Stay Anesthesia Type: MAC Level of consciousness: awake and alert and patient cooperative Pain management: pain level controlled Vital Signs Assessment: post-procedure vital signs reviewed and stable Respiratory status: spontaneous breathing Cardiovascular status: stable Postop Assessment: no apparent nausea or vomiting Anesthetic complications: no     Last Vitals:  Vitals:   10/05/18 0836  BP: (!) 177/86  Pulse: 79  Temp: 36.6 C  SpO2: 98%    Last Pain:  Vitals:   10/05/18 0836  TempSrc: Oral  PainSc: 0-No pain                 Catharina Pica

## 2018-10-05 NOTE — Op Note (Signed)
Date of Admission: 10/05/2018  Date of Surgery: 10/05/2018  Pre-Op Dx: Cataract Right  Eye  Post-Op Dx: Senile Nuclear Cataract  Right  Eye,  Dx Code H25.11  Surgeon: Tonny Branch, M.D.  Assistants: None  Anesthesia: Topical with MAC  Indications: Painless, progressive loss of vision with compromise of daily activities.  Surgery: Cataract Extraction with Intraocular lens Implant Right Eye  Discription: The patient had dilating drops and viscous lidocaine placed into the Right eye in the pre-op holding area. After transfer to the operating room, a time out was performed. The patient was then prepped and draped. Beginning with a 16m blade a paracentesis port was made at the surgeon's 2 o'clock position. The anterior chamber was then filled with 1% non-preserved lidocaine. This was followed by filling the anterior chamber with Provisc.  A 2.493mkeratome blade was used to make a clear corneal incision at the temporal limbus.  A bent cystatome needle was used to create a continuous tear capsulotomy. Hydrodissection was performed with balanced salt solution on a Fine canula. The lens nucleus was then removed using the phacoemulsification handpiece. Residual cortex was removed with the I&A handpiece. The anterior chamber and capsular bag were refilled with Provisc. A posterior chamber intraocular lens was placed into the capsular bag with it's injector. The implant was positioned with the Kuglan hook. The Provisc was then removed from the anterior chamber and capsular bag with the I&A handpiece. Stromal hydration of the main incision and paracentesis port was performed with BSS on a Fine canula. The wounds were tested for leak which was negative. The patient tolerated the procedure well. There were no operative complications. The patient was then transferred to the recovery room in stable condition.  Complications: None  Specimen: None  EBL: None  Prosthetic device: [employer] Technis, PCB00, power 22.0,  SN [id number]

## 2018-10-05 NOTE — Transfer of Care (Signed)
Immediate Anesthesia Transfer of Care Note  Patient: Elizabeth Aguilar  Procedure(s) Performed: CATARACT EXTRACTION PHACO AND INTRAOCULAR LENS PLACEMENT ([facility]) (Right Eye)  Patient Location: Short Stay  Anesthesia Type:MAC  Level of Consciousness: awake, alert  and patient cooperative  Airway & Oxygen Therapy: Patient Spontanous Breathing  Post-op Assessment: Report given to RN and Post -op Vital signs reviewed and stable  Post vital signs: Reviewed and stable  Last Vitals:  Vitals Value Taken Time  BP    Temp    Pulse    Resp    SpO2      Last Pain:  Vitals:   10/05/18 0836  TempSrc: Oral  PainSc: 0-No pain         Complications: No apparent anesthesia complications

## 2018-10-06 ENCOUNTER — Encounter (HOSPITAL_COMMUNITY): Payer: Self-pay | Admitting: Ophthalmology

## 2018-10-16 ENCOUNTER — Encounter: Payer: Self-pay | Admitting: Family Medicine

## 2018-10-25 ENCOUNTER — Other Ambulatory Visit: Payer: Self-pay | Admitting: Family Medicine

## 2018-11-16 ENCOUNTER — Ambulatory Visit: Payer: Medicare Other | Admitting: Cardiovascular Disease

## 2018-11-24 DIAGNOSIS — E663 Overweight: Secondary | ICD-10-CM | POA: Diagnosis not present

## 2018-11-24 DIAGNOSIS — M150 Primary generalized (osteo)arthritis: Secondary | ICD-10-CM | POA: Diagnosis not present

## 2018-11-24 DIAGNOSIS — M545 Low back pain: Secondary | ICD-10-CM | POA: Diagnosis not present

## 2018-11-24 DIAGNOSIS — Z1589 Genetic susceptibility to other disease: Secondary | ICD-10-CM | POA: Diagnosis not present

## 2018-11-24 DIAGNOSIS — M2550 Pain in unspecified joint: Secondary | ICD-10-CM | POA: Diagnosis not present

## 2018-11-24 DIAGNOSIS — M1009 Idiopathic gout, multiple sites: Secondary | ICD-10-CM | POA: Diagnosis not present

## 2018-11-24 DIAGNOSIS — Z6826 Body mass index (BMI) 26.0-26.9, adult: Secondary | ICD-10-CM | POA: Diagnosis not present

## 2018-12-09 ENCOUNTER — Other Ambulatory Visit: Payer: Self-pay | Admitting: Family Medicine

## 2018-12-10 ENCOUNTER — Ambulatory Visit (INDEPENDENT_AMBULATORY_CARE_PROVIDER_SITE_OTHER): Payer: Medicare Other | Admitting: Family Medicine

## 2018-12-10 VITALS — BP 138/92 | Temp 98.0°F | Ht 64.0 in | Wt 147.0 lb

## 2018-12-10 DIAGNOSIS — R300 Dysuria: Secondary | ICD-10-CM

## 2018-12-10 DIAGNOSIS — N3001 Acute cystitis with hematuria: Secondary | ICD-10-CM | POA: Diagnosis not present

## 2018-12-10 LAB — POCT URINALYSIS DIPSTICK
NITRITE UA: POSITIVE
SPEC GRAV UA: 1.015 (ref 1.010–1.025)
pH, UA: 6 (ref 5.0–8.0)

## 2018-12-10 MED ORDER — NITROFURANTOIN MONOHYD MACRO 100 MG PO CAPS: 100.0000 mg | ORAL_CAPSULE | Freq: Two times a day (BID) | ORAL | 0 refills | Status: AC

## 2018-12-10 NOTE — Progress Notes (Signed)
   Subjective:    Patient ID: Elizabeth Aguilar, female    DOB: 09-04-1941, 78 y.o.   MRN: [id number]  Urinary Tract Infection   This is a new problem. Episode onset: 5 days. Associated symptoms include chills, frequency and urgency. Pertinent negatives include no flank pain, hematuria, nausea or vomiting. Associated symptoms comments: dysuria. Treatments tried: azo.   No fever, but has chills. No N/V. No flank pain. No hematuria. Reports frequent UTIs last year. Thinking about going to see Dr. Alyson Ingles d/t continued issues with urgency and incontinence.     Review of Systems  Constitutional: Positive for chills. Negative for fever.  Gastrointestinal: Negative for abdominal pain, nausea and vomiting.  Genitourinary: Positive for dysuria, frequency and urgency. Negative for flank pain and hematuria.       Objective:   Physical Exam Vitals signs and nursing note reviewed.  Constitutional:      General: She is not in acute distress.    Appearance: She is well-developed.  HENT:     Head: Normocephalic and atraumatic.  Neck:     Musculoskeletal: Neck supple.  Cardiovascular:     Rate and Rhythm: Normal rate and regular rhythm.     Heart sounds: Normal heart sounds. No murmur.  Pulmonary:     Effort: Pulmonary effort is normal. No respiratory distress.     Breath sounds: Normal breath sounds.  Skin:    General: Skin is warm and dry.  Neurological:     Mental Status: She is alert and oriented to person, place, and time.    Results for orders placed or performed in visit on 12/10/18  POCT urinalysis dipstick  Result Value Ref Range   Color, UA     Clarity, UA     Glucose, UA     Bilirubin, UA ++    Ketones, UA     Spec Grav, UA 1.015 1.010 - 1.025   Blood, UA trace    pH, UA 6.0 5.0 - 8.0   Protein, UA     Urobilinogen, UA     Nitrite, UA positive    Leukocytes, UA 4+ (A) Negative   Appearance     Odor     Microscopic exam of urine: WBCs too numerous to count, rare RBC  noted      Assessment & Plan:  Acute cystitis with hematuria  Dysuria - Plan: POCT urinalysis dipstick, Urine Culture  Will send urine for culture. Treat UTI with macrobid x 5 days. Symptomatic care discussed. Warning signs discussed. F/u if symptoms worsen or fail to improve. Will notify of culture results.  Dr. Mickie Hillier was consulted on this case and is in agreement with the above treatment plan.

## 2018-12-12 LAB — URINE CULTURE

## 2018-12-24 ENCOUNTER — Other Ambulatory Visit: Payer: Self-pay | Admitting: Family Medicine

## 2018-12-24 ENCOUNTER — Telehealth: Payer: Self-pay | Admitting: Family Medicine

## 2018-12-24 ENCOUNTER — Other Ambulatory Visit: Payer: Self-pay | Admitting: *Deleted

## 2018-12-24 MED ORDER — CEFPROZIL 500 MG PO TABS: 500.0000 mg | ORAL_TABLET | Freq: Two times a day (BID) | ORAL | 0 refills | Status: DC

## 2018-12-24 MED ORDER — CEPHALEXIN 500 MG PO CAPS: 500.0000 mg | ORAL_CAPSULE | Freq: Four times a day (QID) | ORAL | 0 refills | Status: DC

## 2018-12-24 NOTE — Telephone Encounter (Signed)
No fever, no other symptoms. She did say she had gotten better,but then the symptoms cam back.

## 2018-12-24 NOTE — Telephone Encounter (Signed)
Please advise 

## 2018-12-24 NOTE — Telephone Encounter (Signed)
Med sent to pharm. Pt notified.  

## 2018-12-24 NOTE — Telephone Encounter (Signed)
Nurses, please call and get more info. Did her previous symptoms ever resolve? What kind of symptoms is she having now? Any fever, chills, N/V, flank pain, hematuria?  Thanks

## 2018-12-24 NOTE — Telephone Encounter (Signed)
Patient is ware and I called the front desk to cancel the appointment for tomorrow.

## 2018-12-24 NOTE — Telephone Encounter (Signed)
pts pharmacy calling in stating the cefPROZIL (CEFZIL) 500 MG tablet is not covered by pts ins and something different needs to be called in.

## 2018-12-24 NOTE — Telephone Encounter (Signed)
Symptoms did get better. Started having pain with urination this morning, no fever, no chills, no flank pain, no blood in urine, no N/V. Just burning with urination and frequency.

## 2018-12-24 NOTE — Telephone Encounter (Signed)
Okay. Please send in Keflex, 500 mg, 1 tablet 4 times daily x 7 days, instead.   Thanks.

## 2018-12-24 NOTE — Telephone Encounter (Signed)
Patient was here on 12/10/18 and saw Elizabeth Aguilar Comment for UTI.  All symptoms have come back now.  She said she didn't think she ever got over it completely.  Wanted to know if we would call in something.  I offered her an appt for today but she can't come today so I scheduled her for tomorrow afternoon but she said she had rather [place] call something in if possible.  [facility] [place].

## 2018-12-24 NOTE — Telephone Encounter (Signed)
Please advise. Thank you

## 2018-12-24 NOTE — Telephone Encounter (Signed)
Thanks for the update. Please let patient know that I sent in a different antibiotic for her to the pharmacy on file, she should start Cefzil, 1 tablet bid x 7 days. It looks like she has taken this in the past and done okay with it.   She can cancel appointment for tomorrow. However, if she develops fever, flank pain, blood in urine she should f/u, or if her symptoms fail to improve or come back again she needs a f/u ov.   Thanks!

## 2018-12-25 ENCOUNTER — Ambulatory Visit: Payer: Medicare Other | Admitting: Family Medicine

## 2018-12-27 ENCOUNTER — Other Ambulatory Visit: Payer: Self-pay | Admitting: Family Medicine

## 2019-01-06 DIAGNOSIS — E1121 Type 2 diabetes mellitus with diabetic nephropathy: Secondary | ICD-10-CM | POA: Diagnosis not present

## 2019-01-06 DIAGNOSIS — E7849 Other hyperlipidemia: Secondary | ICD-10-CM | POA: Diagnosis not present

## 2019-01-07 LAB — BASIC METABOLIC PANEL
BUN/Creatinine Ratio: 15 (ref 12–28)
BUN: 16 mg/dL (ref 8–27)
CALCIUM: 9.4 mg/dL (ref 8.7–10.3)
CHLORIDE: 104 mmol/L (ref 96–106)
CO2: 23 mmol/L (ref 20–29)
Creatinine, Ser: 1.05 mg/dL — ABNORMAL HIGH (ref 0.57–1.00)
GFR, EST AFRICAN AMERICAN: 59 mL/min/{1.73_m2} — AB (ref >59)
GFR, EST NON AFRICAN AMERICAN: 51 mL/min/{1.73_m2} — AB (ref >59)
Glucose: 92 mg/dL (ref 65–99)
Potassium: 4.7 mmol/L (ref 3.5–5.2)
Sodium: 144 mmol/L (ref 134–144)

## 2019-01-07 LAB — HEPATIC FUNCTION PANEL
ALT: 13 IU/L (ref 0–32)
AST: 14 IU/L (ref 0–40)
Albumin: 4 g/dL (ref 3.7–4.7)
Alkaline Phosphatase: 145 IU/L — ABNORMAL HIGH (ref 39–117)
BILIRUBIN TOTAL: 0.2 mg/dL (ref 0.0–1.2)
BILIRUBIN, DIRECT: 0.07 mg/dL (ref 0.00–0.40)
Total Protein: 6.3 g/dL (ref 6.0–8.5)

## 2019-01-07 LAB — MICROALBUMIN / CREATININE URINE RATIO
Creatinine, Urine: 154.5 mg/dL
Microalb/Creat Ratio: 10 mg/g creat (ref 0–29)
Microalbumin, Urine: 15.3 ug/mL

## 2019-01-07 LAB — HEMOGLOBIN A1C
Est. average glucose Bld gHb Est-mCnc: 128 mg/dL
HEMOGLOBIN A1C: 6.1 % — AB (ref 4.8–5.6)

## 2019-01-07 LAB — LIPID PANEL
Chol/HDL Ratio: 3 ratio (ref 0.0–4.4)
Cholesterol, Total: 177 mg/dL (ref 100–199)
HDL: 59 mg/dL (ref >39)
LDL Calculated: 101 mg/dL — ABNORMAL HIGH (ref 0–99)
Triglycerides: 86 mg/dL (ref 0–149)
VLDL CHOLESTEROL CAL: 17 mg/dL (ref 5–40)

## 2019-01-12 ENCOUNTER — Ambulatory Visit (INDEPENDENT_AMBULATORY_CARE_PROVIDER_SITE_OTHER): Payer: Medicare Other | Admitting: Family Medicine

## 2019-01-12 ENCOUNTER — Encounter: Payer: Self-pay | Admitting: Family Medicine

## 2019-01-12 VITALS — BP 122/86 | Ht 64.0 in | Wt 150.0 lb

## 2019-01-12 DIAGNOSIS — I10 Essential (primary) hypertension: Secondary | ICD-10-CM | POA: Diagnosis not present

## 2019-01-12 DIAGNOSIS — R300 Dysuria: Secondary | ICD-10-CM

## 2019-01-12 DIAGNOSIS — E7849 Other hyperlipidemia: Secondary | ICD-10-CM | POA: Diagnosis not present

## 2019-01-12 DIAGNOSIS — Z79891 Long term (current) use of opiate analgesic: Secondary | ICD-10-CM | POA: Diagnosis not present

## 2019-01-12 DIAGNOSIS — E1121 Type 2 diabetes mellitus with diabetic nephropathy: Secondary | ICD-10-CM

## 2019-01-12 LAB — POCT URINALYSIS DIPSTICK
Blood, UA: NEGATIVE
Spec Grav, UA: 1.02 (ref 1.010–1.025)
pH, UA: 5 (ref 5.0–8.0)

## 2019-01-12 MED ORDER — HYDROCODONE-ACETAMINOPHEN 5-325 MG PO TABS: ORAL_TABLET | ORAL | 0 refills | Status: DC

## 2019-01-12 MED ORDER — CEPHALEXIN 500 MG PO CAPS: 500.0000 mg | ORAL_CAPSULE | Freq: Four times a day (QID) | ORAL | 0 refills | Status: DC

## 2019-01-12 MED ORDER — HYDROCODONE-ACETAMINOPHEN 5-325 MG PO TABS: 1.0000 | ORAL_TABLET | Freq: Four times a day (QID) | ORAL | 0 refills | Status: DC | PRN

## 2019-01-12 NOTE — Progress Notes (Signed)
Subjective:    Patient ID: Elizabeth Aguilar, female    DOB: 03/15/1941, 78 y.o.   MRN: [id number]  HPI Patient is here today to follow up on her chronic health issues.Here for Pain management, and follow up on her Uti.  Diabetes: Last A1C 01/06/2019 @ 6.1 on Metformin 500 mg  One bid The patient was seen today as part of a comprehensive diabetic check up.the patient does have diabetes.  The patient follows here on a regular basis.  The patient relates medication compliance. No significant side effects to the medications. Denies any low glucose spells. Relates compliance with diet to a reasonable level. Patient does do labwork intermittently and understands the dangers of diabetes.   Hypertension:Lisinopril 2.5 mg once per day, Metoprolol 100 mg once per day Patient for blood pressure check up.  The patient does have hypertension.  The patient is on medication.  Patient relates compliance with meds. Todays BP reviewed with the patient. Patient denies issues with medication. Patient relates reasonable diet. Patient tries to minimize salt. Patient aware of BP goals.   Gerd:Nexium 40 mg once per day Patient does have ongoing trouble with reflux.  Takes medication on a regular basis.  Tries to minimize foods as best they can.  They understand the importance of dietary compliance.  May also try to avoid eating a large meal close to bedtime.  Patient denies any dysphagia denies hematochezia.  States medicine does a good job keeping the problem under good control.  Without the medication may certainly have issues.They desire to continue taking their medication.  Hyperlipidemia:Lipitor 40 mg once per day Patient here for follow-up regarding cholesterol.  The patient does have hyperlipidemia.  Patient does try to maintain a reasonable diet.  Patient does take the medication on a regular basis.  Denies missing a dose.  The patient denies any obvious side effects.  Prior blood work results reviewed with the  patient.  The patient is aware of his cholesterol goals and the need to keep it under good control to lessen the risk of disease.  UTI:No burning nor stinging,but having some back pain  This patient was seen today for chronic pain  The medication list was reviewed and updated.   -Compliance with medication: Hydrocodone 5-325 mg  - Number patient states they take daily: 2-3 per day  -when was the last dose patient took? Yesterday evening  The patient was advised the importance of maintaining medication and not using illegal substances with these.  Here for refills and follow up  The patient was educated that we can provide 3 monthly scripts for their medication, it is their responsibility to follow the instructions.  Side effects or complications from medications: None  Patient is aware that pain medications are meant to minimize the severity of the pain to allow their pain levels to improve to allow for better function. They are aware of that pain medications cannot totally remove their pain.  Due for UDT ( at least once per year) : 01/12/2019  Results for orders placed or performed in visit on 01/12/19  POCT urinalysis dipstick  Result Value Ref Range   Color, UA     Clarity, UA     Glucose, UA     Bilirubin, UA     Ketones, UA     Spec Grav, UA 1.020 1.010 - 1.025   Blood, UA Negative    pH, UA 5.0 5.0 - 8.0   Protein, UA     Urobilinogen,  UA     Nitrite, UA     Leukocytes, UA Trace (A) Negative   Appearance     Odor         Review of Systems  Constitutional: Negative for activity change, appetite change and fatigue.  HENT: Negative for congestion and rhinorrhea.   Respiratory: Negative for cough and shortness of breath.   Cardiovascular: Negative for chest pain and leg swelling.  Gastrointestinal: Negative for abdominal pain and diarrhea.  Endocrine: Negative for polydipsia and polyphagia.  Genitourinary: Positive for dysuria. Negative for flank pain and  frequency.  Skin: Negative for color change.  Neurological: Negative for dizziness and weakness.  Psychiatric/Behavioral: Negative for behavioral problems and confusion.       Objective:   Physical Exam Vitals signs reviewed.  Constitutional:      General: She is not in acute distress. HENT:     Head: Normocephalic and atraumatic.  Eyes:     General:        Right eye: No discharge.        Left eye: No discharge.  Neck:     Trachea: No tracheal deviation.  Cardiovascular:     Rate and Rhythm: Normal rate and regular rhythm.     Heart sounds: Normal heart sounds. No murmur.  Pulmonary:     Effort: Pulmonary effort is normal. No respiratory distress.     Breath sounds: Normal breath sounds.  Lymphadenopathy:     Cervical: No cervical adenopathy.  Skin:    General: Skin is warm and dry.  Neurological:     Mental Status: She is alert.     Coordination: Coordination normal.  Psychiatric:        Behavior: Behavior normal.           Assessment & Plan:  Under microscope urine does show some WBCs consistent with possible UTI it is fine to go ahead and do antibiotic Keflex for 5 days  The patient was seen in followup for chronic pain. A review over at their current pain status was discussed. Drug registry was checked. Prescriptions were given. Discussion was held regarding the importance of compliance with medication as well as pain medication contract.  Time for questions regarding pain management plan occurred. Importance of regular followup visits was discussed. Patient was informed that medication may cause drowsiness and should not be combined  with other medications/alcohol or street drugs. Patient was cautioned that medication could cause drowsiness. If the patient feels medication is causing altered alertness then do not drive or operate dangerous equipment.  3 scripts were sent into her pharmacy follow-up in 3 months  The patient was seen today as part of a  comprehensive visit for diabetes. The importance of keeping her A1c at or below 7 was discussed.  Importance of regular physical activity was discussed.   The importance of adherence to medication as well as a controlled low starch/sugar diet was also discussed.  Standard follow-up visit recommended.  Also patient aware failure to keep diabetes under control increases the risk of complications. Diabetes under good control continue current measures  The patient was seen today as part of an evaluation regarding hyperlipidemia.  Recent lab work has been reviewed with the patient as well as the goals for good cholesterol care.  In addition to this medications have been discussed the importance of compliance with diet and medications discussed as well.  Finally the patient is aware that poor control of cholesterol, noncompliance can dramatically increase the risk of  complications. The patient will keep regular office visits and the patient does agreed to periodic lab work. Patient states she is taking her cholesterol medicine she was encouraged to try to watch her diet LDL has gone up we will relook at this again in a few months   I would recommend bumping up the dose of the medicine if she is willing to do so  25 minutes was spent with the patient.  This statement verifies that 25 minutes was indeed spent with the patient.  More than 50% of this visit-total duration of the visit-was spent in counseling and coordination of care. The issues that the patient came in for today as reflected in the diagnosis (s) please refer to documentation for further details.

## 2019-01-13 ENCOUNTER — Other Ambulatory Visit: Payer: Self-pay | Admitting: *Deleted

## 2019-01-13 MED ORDER — ATORVASTATIN CALCIUM 80 MG PO TABS: ORAL_TABLET | ORAL | 1 refills | Status: DC

## 2019-01-13 NOTE — Progress Notes (Signed)
Pt states she is willing to go up to 80 mg. New dose sent to pharm. Pt states she will call back if problems with the higher dose.

## 2019-01-15 LAB — TOXASSURE SELECT 13 (MW), URINE

## 2019-01-15 LAB — SPECIMEN STATUS REPORT

## 2019-01-26 ENCOUNTER — Other Ambulatory Visit: Payer: Self-pay

## 2019-01-26 ENCOUNTER — Other Ambulatory Visit: Payer: Self-pay | Admitting: Family Medicine

## 2019-01-26 MED ORDER — ATORVASTATIN CALCIUM 40 MG PO TABS: ORAL_TABLET | ORAL | 1 refills | Status: DC

## 2019-01-26 NOTE — Telephone Encounter (Signed)
Refused the nasal spray Use a atorvastatin 40 mg 1 daily may have 90 with 3 refills

## 2019-02-02 ENCOUNTER — Ambulatory Visit (INDEPENDENT_AMBULATORY_CARE_PROVIDER_SITE_OTHER): Payer: Medicare Other | Admitting: Family Medicine

## 2019-02-02 ENCOUNTER — Other Ambulatory Visit: Payer: Self-pay

## 2019-02-02 ENCOUNTER — Telehealth: Payer: Self-pay | Admitting: Family Medicine

## 2019-02-02 DIAGNOSIS — R300 Dysuria: Secondary | ICD-10-CM

## 2019-02-02 MED ORDER — CEPHALEXIN 500 MG PO CAPS: 500.0000 mg | ORAL_CAPSULE | Freq: Four times a day (QID) | ORAL | 0 refills | Status: DC

## 2019-02-02 NOTE — Telephone Encounter (Signed)
Patient would like to know if she could have medication called in for UTI, offered different times for patient to come in office for an appt and patient states she unable to come in office appt. Advise.    Pharmacy:  [facility] [place]

## 2019-02-02 NOTE — Telephone Encounter (Signed)
Spoke with patient. Pt states she is having back pain on the left side. Pt states she is not having trouble urinating, no fever. Pt has dysuria in early march and is not sure if UTI has went to kidneys. Started last night. Pt states she is unable to come in at the times given due to other appts. Pt informed that providers like to see patient in office to place them on correct med.  Please advise. Thank you.

## 2019-02-02 NOTE — Telephone Encounter (Signed)
Front-please add this patient has a phone visit for this afternoon UTI

## 2019-02-03 NOTE — Progress Notes (Signed)
Patient with significant lower abdominal pain discomfort present past few days she also relates some dysuria and also some flank pain she denies fever chills sweats nausea vomiting diarrhea she does have history of UTI she is able to take in liquids and solids her bowels are moving on a regular basis  10 minutes spent with patient via phone call.  Videography technology was not available Telephone visit was done per protocol the equivalent of 240[address]  Based on the history I recommend antibiotic this was sent and patient was encouraged to follow-up if progressive troubles warning signs were discussed

## 2019-02-05 ENCOUNTER — Telehealth: Payer: Self-pay

## 2019-02-05 NOTE — Telephone Encounter (Signed)
   Cardiac Questionnaire:    Since your last visit or hospitalization:    1. Have you been having new or worsening chest pain? No   2. Have you been having new or worsening shortness of breath? No 3. Have you been having new or worsening leg swelling, wt gain, or increase in abdominal girth (pants fitting more tightly)? No   4. Have you had any passing out spells? No             Primary Cardiologist: Dr.Kelly  Patient contacted.  History reviewed.  No symptoms to suggest any unstable cardiac conditions.  Based on discussion, with current pandemic situation, we will be postponing this appointment for [facility] with a plan for f/u in 12 wks or sooner if feasible/necessary.  If symptoms change, she has been instructed to contact our office.   Routing to C19 CANCEL pool for tracking (P CV DIV CV19 CANCEL - reason for visit "other.") and assigning priority (1 = 4-6 wks, 2 = 6-12 wks, 3 = >12 wks).   Priority 3  Ena Dawley, LPN  8/67/6720 94:70 AM         .

## 2019-02-08 ENCOUNTER — Telehealth: Payer: Self-pay

## 2019-02-08 NOTE — Telephone Encounter (Signed)
Called patient, LVM advising of option for virtual visit for tomorrow. Patient given call back number.

## 2019-02-08 NOTE — Addendum Note (Signed)
Addended by: Sallee Lange A on: 02/08/2019 02:56 PM   Modules accepted: Level of Service

## 2019-02-08 NOTE — Telephone Encounter (Signed)
Called patient, she denied doing any virtual visits at this time and would like to have a call back when schedule becomes available.

## 2019-02-08 NOTE — Telephone Encounter (Signed)
Patient returning call.

## 2019-02-09 ENCOUNTER — Ambulatory Visit: Payer: Medicare Other | Admitting: Cardiovascular Disease

## 2019-02-25 ENCOUNTER — Other Ambulatory Visit: Payer: Self-pay | Admitting: Family Medicine

## 2019-03-23 ENCOUNTER — Other Ambulatory Visit: Payer: Self-pay | Admitting: *Deleted

## 2019-03-23 ENCOUNTER — Telehealth: Payer: Self-pay | Admitting: *Deleted

## 2019-03-23 MED ORDER — PANTOPRAZOLE SODIUM 40 MG PO TBEC: 40.0000 mg | DELAYED_RELEASE_TABLET | Freq: Every day | ORAL | 3 refills | Status: DC

## 2019-03-23 NOTE — Telephone Encounter (Signed)
Discussed with pt. Pt verbalized understanding and protonix sent to pharm with a note to cancel nexium

## 2019-03-23 NOTE — Telephone Encounter (Signed)
Fax from [place] [facility]. Current medication esomeprazole mag 40mg  not covered. Requesting to change to omeprazole capsules 40mg  or pantoprazole tablets 40mg . See form in dr scott's folder.

## 2019-03-23 NOTE — Telephone Encounter (Signed)
Importantly let the patient know that her medicine is not covered any longer - eomeprazole  Pantoprazole 40 mg is covered I recommend pantoprazole 40 mg take 1 daily, #90, 3 refills

## 2019-03-24 ENCOUNTER — Other Ambulatory Visit: Payer: Self-pay | Admitting: Family Medicine

## 2019-03-24 NOTE — Telephone Encounter (Signed)
Contacted patient and patient states that she is now taking Atorvastatin 40 mg daily

## 2019-03-28 ENCOUNTER — Other Ambulatory Visit: Payer: Self-pay | Admitting: Family Medicine

## 2019-03-30 ENCOUNTER — Encounter: Payer: Self-pay | Admitting: Family Medicine

## 2019-03-30 ENCOUNTER — Ambulatory Visit (INDEPENDENT_AMBULATORY_CARE_PROVIDER_SITE_OTHER): Payer: Medicare Other | Admitting: Family Medicine

## 2019-03-30 ENCOUNTER — Other Ambulatory Visit: Payer: Self-pay

## 2019-03-30 DIAGNOSIS — J019 Acute sinusitis, unspecified: Secondary | ICD-10-CM

## 2019-03-30 MED ORDER — CEFDINIR 300 MG PO CAPS: 300.0000 mg | ORAL_CAPSULE | Freq: Two times a day (BID) | ORAL | 0 refills | Status: DC

## 2019-03-30 NOTE — Progress Notes (Signed)
   Subjective:  Audio  Patient ID: Elizabeth Aguilar, female    DOB: Jun 22, 1941, 78 y.o.   MRN: [id number] Phone only Sinusitis  This is a new problem. Episode onset: one week. Associated symptoms include congestion, ear pain, headaches, a hoarse voice and sinus pressure. Treatments tried: flonase.  pollen too challemges Sores in nose for 3 days.   Virtual Visit via Telephone Note  I connected with Elizabeth Aguilar on 03/30/19 at  3:00 PM EDT by telephone and verified that I am speaking with the correct person using two identifiers.  Location: Patient: home Provider: office   I discussed the limitations, risks, security and privacy concerns of performing an evaluation and management service by telephone and the availability of in person appointments. I also discussed with the patient that there may be a patient responsible charge related to this service. The patient expressed understanding and agreed to proceed.   History of Present Illness:    Observations/Objective:   Assessment and Plan:   Follow Up Instructions:    I discussed the assessment and treatment plan with the patient. The patient was provided an opportunity to ask questions and all were answered. The patient agreed with the plan and demonstrated an understanding of the instructions.   The patient was advised to call back or seek an in-person evaluation if the symptoms worsen or if the condition fails to improve as anticipated.  I provide18 minutes of non-face-to-face time during this encounter.       Review of Systems  HENT: Positive for congestion, ear pain, hoarse voice and sinus pressure.   Neurological: Positive for headaches.       Objective:   Physical Exam   virtual     Assessment & Plan:  Impression rhinosinusitis likely post viral, discussed with patient. plan antibiotics prescribed. Questions answered. Symptomatic care discussed. warning signs discussed. WSL

## 2019-04-12 ENCOUNTER — Ambulatory Visit (INDEPENDENT_AMBULATORY_CARE_PROVIDER_SITE_OTHER): Payer: Medicare Other | Admitting: Family Medicine

## 2019-04-12 ENCOUNTER — Other Ambulatory Visit: Payer: Self-pay

## 2019-04-12 DIAGNOSIS — J019 Acute sinusitis, unspecified: Secondary | ICD-10-CM

## 2019-04-12 DIAGNOSIS — J301 Allergic rhinitis due to pollen: Secondary | ICD-10-CM | POA: Diagnosis not present

## 2019-04-12 MED ORDER — CEPHALEXIN 500 MG PO CAPS: 500.0000 mg | ORAL_CAPSULE | Freq: Four times a day (QID) | ORAL | 0 refills | Status: DC

## 2019-04-12 MED ORDER — AZELASTINE HCL 0.1 % NA SOLN: 2.0000 | Freq: Two times a day (BID) | NASAL | 12 refills | Status: DC

## 2019-04-12 NOTE — Progress Notes (Signed)
   Subjective:    Patient ID: Elizabeth Aguilar, female    DOB: 04-09-1941, 78 y.o.   MRN: [id number]  Sinusitis  This is a new problem. Episode onset: 2 weeks ago. Associated symptoms include congestion, coughing, headaches and sinus pressure. Pertinent negatives include no ear pain or shortness of breath. Treatments tried: cefdinir.  Head congestion drainage coughing sinus pressure denies wheezing difficulty breathing denies vomiting diarrhea or sweats or chills PMH benign Virtual Visit via Telephone Note  I connected with Elizabeth Aguilar on 04/12/19 at  2:30 PM EDT by telephone and verified that I am speaking with the correct person using two identifiers.  Location: Patient: home Provider: office   I discussed the limitations, risks, security and privacy concerns of performing an evaluation and management service by telephone and the availability of in person appointments. I also discussed with the patient that there may be a patient responsible charge related to this service. The patient expressed understanding and agreed to proceed.   History of Present Illness:    Observations/Objective:   Assessment and Plan:   Follow Up Instructions:    I discussed the assessment and treatment plan with the patient. The patient was provided an opportunity to ask questions and all were answered. The patient agreed with the plan and demonstrated an understanding of the instructions.   The patient was advised to call back or seek an in-person evaluation if the symptoms worsen or if the condition fails to improve as anticipated.  I provided 15 minutes of non-face-to-face time during this encounter.      Review of Systems  Constitutional: Negative for activity change and fever.  HENT: Positive for congestion, rhinorrhea and sinus pressure. Negative for ear pain.   Eyes: Negative for discharge.  Respiratory: Positive for cough. Negative for shortness of breath and wheezing.   Cardiovascular:  Negative for chest pain.  Neurological: Positive for headaches.       Objective:   Physical Exam  Today's visit was via telephone Physical exam was not possible for this visit       Assessment & Plan:  Probable sinusitis with allergies medications recommended antibiotics prescribed warning signs discussed follow-up if progressive troubles or if worse

## 2019-04-14 ENCOUNTER — Other Ambulatory Visit: Payer: Self-pay

## 2019-04-14 ENCOUNTER — Ambulatory Visit (INDEPENDENT_AMBULATORY_CARE_PROVIDER_SITE_OTHER): Payer: Medicare Other | Admitting: Family Medicine

## 2019-04-14 DIAGNOSIS — M5440 Lumbago with sciatica, unspecified side: Secondary | ICD-10-CM | POA: Diagnosis not present

## 2019-04-14 DIAGNOSIS — G8929 Other chronic pain: Secondary | ICD-10-CM

## 2019-04-14 DIAGNOSIS — I5042 Chronic combined systolic (congestive) and diastolic (congestive) heart failure: Secondary | ICD-10-CM

## 2019-04-14 DIAGNOSIS — I10 Essential (primary) hypertension: Secondary | ICD-10-CM | POA: Diagnosis not present

## 2019-04-14 DIAGNOSIS — E1121 Type 2 diabetes mellitus with diabetic nephropathy: Secondary | ICD-10-CM | POA: Diagnosis not present

## 2019-04-14 MED ORDER — ALPRAZOLAM 0.5 MG PO TABS: 0.5000 mg | ORAL_TABLET | Freq: Two times a day (BID) | ORAL | 5 refills | Status: DC | PRN

## 2019-04-14 MED ORDER — METFORMIN HCL 500 MG PO TABS: ORAL_TABLET | ORAL | 1 refills | Status: DC

## 2019-04-14 NOTE — Progress Notes (Signed)
Subjective:    Patient ID: Elizabeth Aguilar, female    DOB: 14-Jul-1941, 78 y.o.   MRN: [id number] Phone only no video available HPI This patient was seen today for chronic pain. Takes for pain in her joints  The medication list was reviewed and updated.   -Compliance with medication: 2-3 a day  - Number patient states they take daily: 3 occas 4 a day  -when was the last dose patient took? Last night  The patient was advised the importance of maintaining medication and not using illegal substances with these.  Here for refills and follow up  The patient was educated that we can provide 3 monthly scripts for their medication, it is their responsibility to follow the instructions.  Side effects or complications from medications: none  Patient is aware that pain medications are meant to minimize the severity of the pain to allow their pain levels to improve to allow for better function. They are aware of that pain medications cannot totally remove their pain.  Due for UDT ( at least once per year) : last one 01/12/19  Virtual Visit via Telephone Note  I connected with Elizabeth Aguilar on 04/14/19 at  3:00 PM EDT by telephone and verified that I am speaking with the correct person using two identifiers.  Location: Patient: home Provider: office   I discussed the limitations, risks, security and privacy concerns of performing an evaluation and management service by telephone and the availability of in person appointments. I also discussed with the patient that there may be a patient responsible charge related to this service. The patient expressed understanding and agreed to proceed.   History of Present Illness:    Observations/Objective:   Assessment and Plan:   Follow Up Instructions:    I discussed the assessment and treatment plan with the patient. The patient was provided an opportunity to ask questions and all were answered. The patient agreed with the plan and  demonstrated an understanding of the instructions.   The patient was advised to call back or seek an in-person evaluation if the symptoms worsen or if the condition fails to improve as anticipated.  I provided 15 minutes of non-face-to-face time during this encounter.         Review of Systems  Constitutional: Negative for activity change, appetite change and fatigue.  HENT: Negative for congestion and rhinorrhea.   Respiratory: Negative for cough and shortness of breath.   Cardiovascular: Negative for chest pain and leg swelling.  Gastrointestinal: Negative for abdominal pain and diarrhea.  Endocrine: Negative for polydipsia and polyphagia.  Skin: Negative for color change.  Neurological: Negative for dizziness and weakness.  Psychiatric/Behavioral: Negative for behavioral problems and confusion.       Objective:   Physical Exam  Today's visit was via telephone Physical exam was not possible for this visit       Assessment & Plan:  The patient was seen in followup for chronic pain. A review over at their current pain status was discussed. Drug registry was checked. Prescriptions were given. Discussion was held regarding the importance of compliance with medication as well as pain medication contract.  Time for questions regarding pain management plan occurred. Importance of regular followup visits was discussed. Patient was informed that medication may cause drowsiness and should not be combined  with other medications/alcohol or street drugs. Patient was cautioned that medication could cause drowsiness. If the patient feels medication is causing altered alertness then do not drive  or operate dangerous equipment.   HTN- Patient was seen today as part of a visit regarding hypertension. The importance of healthy diet and regular physical activity was discussed. The importance of compliance with medications discussed.  Ideal goal is to keep blood pressure low elevated  levels certainly below 485/92 when possible.  The patient was counseled that keeping blood pressure under control lessen his risk of complications.  The importance of regular follow-ups was discussed with the patient.  Low-salt diet such as DASH recommended.  Regular physical activity was recommended as well.  Patient was advised to keep regular follow-ups.  The patient was seen today as part of an evaluation regarding hyperlipidemia.  Recent lab work has been reviewed with the patient as well as the goals for good cholesterol care.  In addition to this medications have been discussed the importance of compliance with diet and medications discussed as well.  Finally the patient is aware that poor control of cholesterol, noncompliance can dramatically increase the risk of complications. The patient will keep regular office visits and the patient does agreed to periodic lab work.  Patient will do lab work with a face-to-face office visit in September

## 2019-04-15 ENCOUNTER — Telehealth: Payer: Self-pay | Admitting: Family Medicine

## 2019-04-15 DIAGNOSIS — E7849 Other hyperlipidemia: Secondary | ICD-10-CM

## 2019-04-15 DIAGNOSIS — E1121 Type 2 diabetes mellitus with diabetic nephropathy: Secondary | ICD-10-CM

## 2019-04-15 DIAGNOSIS — I10 Essential (primary) hypertension: Secondary | ICD-10-CM

## 2019-04-15 NOTE — Telephone Encounter (Signed)
Blood work ordered in Epic. Patient notified. 

## 2019-04-15 NOTE — Telephone Encounter (Signed)
Lipid, met 7, A1c HTN hyperlipidemia diabetes

## 2019-04-15 NOTE — Telephone Encounter (Signed)
Pt needs lab orders per Dr. Nicki Reaper for her September appointment  Appt is scheduled - please order labs and call pt when done

## 2019-04-15 NOTE — Telephone Encounter (Signed)
Last labs 12/2018: Lipid, Liver, Met 7, HgbA1c, Urine microprotien

## 2019-04-19 ENCOUNTER — Telehealth: Payer: Self-pay | Admitting: Family Medicine

## 2019-04-19 ENCOUNTER — Other Ambulatory Visit: Payer: Self-pay | Admitting: *Deleted

## 2019-04-19 MED ORDER — CEPHALEXIN 500 MG PO CAPS: 500.0000 mg | ORAL_CAPSULE | Freq: Three times a day (TID) | ORAL | 0 refills | Status: DC

## 2019-04-19 NOTE — Telephone Encounter (Signed)
Pt calling checking status. She is in a lot of pain and is going out of town tomorrow and needs something called in today.

## 2019-04-19 NOTE — Telephone Encounter (Signed)
Med sent to pharm. Pt notified.  

## 2019-04-19 NOTE — Telephone Encounter (Signed)
Please advise. Thank you

## 2019-04-19 NOTE — Telephone Encounter (Signed)
Keflex 500 tid ten d 

## 2019-04-19 NOTE — Telephone Encounter (Signed)
Patient has  Abscess tooth and wanting antibiotic called into  The drug store in [place].

## 2019-04-27 ENCOUNTER — Other Ambulatory Visit: Payer: Self-pay | Admitting: Family Medicine

## 2019-04-28 NOTE — Telephone Encounter (Signed)
Pt is calling wanting to know why her pain medication and xanax hasnt been sent to the pharmacy.

## 2019-04-28 NOTE — Telephone Encounter (Signed)
Please clarify with patient make sure she gets all of her medicine through the drugstore Also find out from the drugstore do they have any of her pain medicine prescriptions on file? If not I will need to send in 3 Please let me know what you find out thank you

## 2019-04-29 NOTE — Telephone Encounter (Signed)
Duplicates I already filled this

## 2019-04-29 NOTE — Telephone Encounter (Signed)
Pt gets all of her meds from the drug store. Called pharm and he states he called here yesterday and told us he did not have refills on her xanax or her pain meds.

## 2019-05-07 ENCOUNTER — Telehealth: Payer: Self-pay | Admitting: Cardiovascular Disease

## 2019-05-07 NOTE — Telephone Encounter (Signed)

## 2019-05-10 ENCOUNTER — Ambulatory Visit: Payer: Medicare Other | Admitting: Cardiovascular Disease

## 2019-05-17 ENCOUNTER — Other Ambulatory Visit: Payer: Self-pay

## 2019-05-17 ENCOUNTER — Ambulatory Visit (INDEPENDENT_AMBULATORY_CARE_PROVIDER_SITE_OTHER): Payer: Medicare Other | Admitting: Family Medicine

## 2019-05-17 DIAGNOSIS — N3000 Acute cystitis without hematuria: Secondary | ICD-10-CM

## 2019-05-17 MED ORDER — CEPHALEXIN 500 MG PO CAPS: 500.0000 mg | ORAL_CAPSULE | Freq: Four times a day (QID) | ORAL | 0 refills | Status: DC
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
## 2019-05-17 NOTE — Progress Notes (Signed)
   Subjective:    Patient ID: Elizabeth Aguilar, female    DOB: [birth year], 78 y.o.   MRN: [id number] Telephone only Urinary Tract Infection  This is a new problem. Episode onset: Friday. Associated symptoms include flank pain and frequency. Pertinent negatives include no nausea or vomiting. Associated symptoms comments: Painful urination . Treatments tried: AZO. The treatment provided no relief.   Virtual Visit via Video Note  I connected with Elizabeth Aguilar on 05/17/19 at  4:10 PM EDT by a video enabled telemedicine application and verified that I am speaking with the correct person using two identifiers.  Location: Patient: home Provider: office   I discussed the limitations of evaluation and management by telemedicine and the availability of in person appointments. The patient expressed understanding and agreed to proceed.  History of Present Illness:    Observations/Objective:   Assessment and Plan:   Follow Up Instructions:    I discussed the assessment and treatment plan with the patient. The patient was provided an opportunity to ask questions and all were answered. The patient agreed with the plan and demonstrated an understanding of the instructions.   The patient was advised to call back or seek an in-person evaluation if the symptoms worsen or if the condition fails to improve as anticipated.  I provided 10 minutes of non-face-to-face time during this encounter.   Vicente Males, LPN    Review of Systems  Constitutional: Negative for activity change and appetite change.  HENT: Negative for congestion and rhinorrhea.   Respiratory: Negative for cough and shortness of breath.   Cardiovascular: Negative for chest pain and leg swelling.  Gastrointestinal: Negative for abdominal pain, nausea and vomiting.  Genitourinary: Positive for dysuria, flank pain and frequency.  Skin: Negative for color change.  Neurological: Negative for dizziness and weakness.   Psychiatric/Behavioral: Negative for agitation and confusion.  Upon further questioning its lower back pain bilateral unlikely to be her kidneys     Objective:   Physical Exam Today's visit was via telephone Physical exam was not possible for this visit        Assessment & Plan:  Patient with UTI symptoms Consistent with urinary tract Keflex 4 times daily for 7 days If progressive troubles or worse to follow-up immediately follow-up sooner if any problems

## 2019-05-18 ENCOUNTER — Ambulatory Visit: Payer: Medicare Other | Admitting: Adult Health

## 2019-05-26 ENCOUNTER — Other Ambulatory Visit: Payer: Self-pay | Admitting: Family Medicine

## 2019-05-27 ENCOUNTER — Other Ambulatory Visit: Payer: Self-pay | Admitting: Family Medicine

## 2019-05-27 MED ORDER — HYDROCODONE-ACETAMINOPHEN 5-325 MG PO TABS: ORAL_TABLET | ORAL | 0 refills | Status: DC

## 2019-05-27 NOTE — Telephone Encounter (Signed)
Please inform patient I sent in 3 scripts regarding her pain medicine so she should be covered for the next 3 months this prescription is now a duplicate and can be handled accordingly

## 2019-05-27 NOTE — Telephone Encounter (Signed)
Left message to return call 

## 2019-05-27 NOTE — Telephone Encounter (Signed)
Pt returned call and verbalized understanding  
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
## 2019-05-31 ENCOUNTER — Telehealth: Payer: Self-pay | Admitting: Adult Health

## 2019-05-31 ENCOUNTER — Encounter: Payer: Self-pay | Admitting: Adult Health

## 2019-05-31 ENCOUNTER — Ambulatory Visit (INDEPENDENT_AMBULATORY_CARE_PROVIDER_SITE_OTHER): Payer: Medicare Other | Admitting: Adult Health

## 2019-05-31 ENCOUNTER — Other Ambulatory Visit: Payer: Self-pay

## 2019-05-31 VITALS — BP 158/76 | HR 83 | Ht 63.0 in | Wt 149.0 lb

## 2019-05-31 DIAGNOSIS — I519 Heart disease, unspecified: Secondary | ICD-10-CM | POA: Diagnosis not present

## 2019-05-31 DIAGNOSIS — Z79899 Other long term (current) drug therapy: Secondary | ICD-10-CM | POA: Diagnosis not present

## 2019-05-31 DIAGNOSIS — E7800 Pure hypercholesterolemia, unspecified: Secondary | ICD-10-CM | POA: Diagnosis not present

## 2019-05-31 DIAGNOSIS — I10 Essential (primary) hypertension: Secondary | ICD-10-CM | POA: Diagnosis not present

## 2019-05-31 DIAGNOSIS — R0789 Other chest pain: Secondary | ICD-10-CM | POA: Diagnosis not present

## 2019-05-31 DIAGNOSIS — R0602 Shortness of breath: Secondary | ICD-10-CM | POA: Diagnosis not present

## 2019-05-31 MED ORDER — LISINOPRIL 5 MG PO TABS: 5.0000 mg | ORAL_TABLET | Freq: Every day | ORAL | 3 refills | Status: DC

## 2019-05-31 NOTE — Telephone Encounter (Signed)
I called pt to confirm her appt for 05-31-19.         COVID-19 Pre-Screening Questions:   In the past 7 to 10 days have you had a cough,  shortness of breath, headache, congestion, fever (100 or greater) body aches, chills, sore throat, or sudden loss of taste or sense of smell? no  Have you been around anyone with known Covid 19.  Have you been around anyone who is awaiting Covid 19 test results in the past 7 to 10 days? no  Have you been around anyone who has been exposed to Covid 19, or has mentioned symptoms of Covid 19 within the past 7 to 10 days? no  If you have any concerns/questions about symptoms patients report during screening (either on the phone or at threshold). Contact the provider seeing the patient or DOD for further guidance.  If neither are available contact a member of the leadership team.

## 2019-05-31 NOTE — Patient Instructions (Addendum)
Medication Instructions:  INCREASE- Lisinopril 5 mg daily TAKE- Metoprolol Succinate at bedtime  If you need a refill on your cardiac medications before your next appointment, please call your pharmacy.  Labwork: BMP and Magnesium HERE IN OUR OFFICE AT [facility] You will NOT need to fast   Take the provided lab slips with you to the lab for your blood draw.   When you have your labs (blood work) drawn today and your tests are completely normal, you will receive your results only by MyChart Message (if you have MyChart) -OR-  A paper copy in the mail.  If you have any lab test that is abnormal or we need to change your treatment, we will call you to review these results.  Testing/Procedures: Your physician has requested that you have an echocardiogram. Echocardiography is a painless test that uses sound waves to create images of your heart. It provides your doctor with information about the size and shape of your heart and how well your heart's chambers and valves are working. This procedure takes approximately one hour. There are no restrictions for this procedure.  A chest x-ray takes a picture of the organs and structures inside the chest, including the heart, lungs, and blood vessels. This test can show several things, including, whether the heart is enlarges; whether fluid is building up in the lungs; and whether pacemaker / defibrillator leads are still in place.   Follow-Up: You will need a follow up appointment in 3 months.  Please call our office 2 months in advance to schedule this appointment.  You may see Dr Claiborne Billings or one of the following Advanced Practice Providers on your designated Care Team: [name], [place] . Fabian Sharp, PA-C     At [facility], you and your health needs are our priority.  As part of our continuing mission to provide you with exceptional heart care, we have created designated Provider Care Teams.  These Care Teams include your primary Cardiologist (physician)  and Advanced Practice Providers (APPs -  Physician Assistants and Nurse Practitioners) who all work together to provide you with the care you need, when you need it.  Thank you for choosing [facility] at [facility]!!

## 2019-06-02 ENCOUNTER — Ambulatory Visit (HOSPITAL_COMMUNITY)
Admission: RE | Admit: 2019-06-02 | Discharge: 2019-06-02 | Disposition: A | Discharge status: Home / Self Care | Payer: Medicare Other | Source: Ambulatory Visit | Attending: Adult Health | Admitting: Adult Health

## 2019-06-02 ENCOUNTER — Other Ambulatory Visit: Payer: Self-pay

## 2019-06-02 DIAGNOSIS — I10 Essential (primary) hypertension: Secondary | ICD-10-CM

## 2019-06-02 DIAGNOSIS — I519 Heart disease, unspecified: Secondary | ICD-10-CM | POA: Diagnosis not present

## 2019-06-02 DIAGNOSIS — R0789 Other chest pain: Secondary | ICD-10-CM | POA: Insufficient documentation

## 2019-06-02 NOTE — Progress Notes (Signed)
*  PRELIMINARY RESULTS* Echocardiogram 2D Echocardiogram has been performed.  Samuel Germany 06/02/2019, 12:36 PM

## 2019-06-04 ENCOUNTER — Telehealth: Payer: Self-pay | Admitting: Adult Health

## 2019-06-04 DIAGNOSIS — R0602 Shortness of breath: Secondary | ICD-10-CM

## 2019-06-04 DIAGNOSIS — R0789 Other chest pain: Secondary | ICD-10-CM

## 2019-06-04 NOTE — Telephone Encounter (Signed)
Notes recorded by Lendon Colonel, NP on 06/02/2019 at 5:14 PM EDT  Echocardiogram results are reviewed. Patient's heart pumping function is normal, compared to prior echocardiogram with reduced heart function she is now normalized. This is good news.. She has some mild stiffening on relaxation of her heart muscle. Her aortic valve has some mild crusting but there is no evidence of stenosis or stiffening.  Patient called w/echo results. CXR ordered for [facility] per patient request instead of testing location in [place]

## 2019-06-04 NOTE — Telephone Encounter (Signed)
New Message   Patient is returning call in reference to echocardiogram results. She states that she was also suppose to have a chest xray but has not had it yet.

## 2019-06-30 DIAGNOSIS — I10 Essential (primary) hypertension: Secondary | ICD-10-CM | POA: Diagnosis not present

## 2019-06-30 DIAGNOSIS — Z79899 Other long term (current) drug therapy: Secondary | ICD-10-CM | POA: Diagnosis not present

## 2019-06-30 DIAGNOSIS — E1121 Type 2 diabetes mellitus with diabetic nephropathy: Secondary | ICD-10-CM | POA: Diagnosis not present

## 2019-06-30 DIAGNOSIS — E7849 Other hyperlipidemia: Secondary | ICD-10-CM | POA: Diagnosis not present

## 2019-07-01 ENCOUNTER — Telehealth: Payer: Self-pay | Admitting: *Deleted

## 2019-07-01 LAB — BASIC METABOLIC PANEL
BUN/Creatinine Ratio: 18 (ref 12–28)
BUN: 18 mg/dL (ref 8–27)
CO2: 24 mmol/L (ref 20–29)
Calcium: 9.8 mg/dL (ref 8.7–10.3)
Chloride: 102 mmol/L (ref 96–106)
Creatinine, Ser: 0.98 mg/dL (ref 0.57–1.00)
GFR calc Af Amer: 63 mL/min/{1.73_m2} (ref >59)
GFR calc non Af Amer: 55 mL/min/{1.73_m2} — ABNORMAL LOW (ref >59)
Glucose: 98 mg/dL (ref 65–99)
Potassium: 5 mmol/L (ref 3.5–5.2)
Sodium: 142 mmol/L (ref 134–144)

## 2019-07-01 LAB — LIPID PANEL
Chol/HDL Ratio: 3.7 ratio (ref 0.0–4.4)
Cholesterol, Total: 187 mg/dL (ref 100–199)
HDL: 51 mg/dL (ref >39)
LDL Calculated: 102 mg/dL — ABNORMAL HIGH (ref 0–99)
Triglycerides: 168 mg/dL — ABNORMAL HIGH (ref 0–149)
VLDL Cholesterol Cal: 34 mg/dL (ref 5–40)

## 2019-07-01 LAB — HEMOGLOBIN A1C
Est. average glucose Bld gHb Est-mCnc: 134 mg/dL
Hgb A1c MFr Bld: 6.3 % — ABNORMAL HIGH (ref 4.8–5.6)

## 2019-07-01 LAB — MAGNESIUM: Magnesium: 1.6 mg/dL (ref 1.6–2.3)

## 2019-07-01 MED ORDER — MAGNESIUM OXIDE 400 MG PO CAPS: 400.0000 mg | ORAL_CAPSULE | Freq: Every day | ORAL | 3 refills | Status: DC

## 2019-07-01 NOTE — Telephone Encounter (Signed)
Pt aware of her blood work, Mag Ox 400 mg send into pt pharmacy

## 2019-07-01 NOTE — Telephone Encounter (Signed)
-----   Message from Lendon Colonel, NP sent at 07/01/2019  7:34 AM EDT ----- Magnesium is low normal. Will need to have higher levels. Begin magnesium 400 mg daily, please

## 2019-07-02 ENCOUNTER — Other Ambulatory Visit: Payer: Self-pay

## 2019-07-02 ENCOUNTER — Ambulatory Visit (HOSPITAL_COMMUNITY)
Admission: RE | Admit: 2019-07-02 | Discharge: 2019-07-02 | Disposition: A | Discharge status: Home / Self Care | Payer: Medicare Other | Source: Ambulatory Visit | Attending: Adult Health | Admitting: Adult Health

## 2019-07-02 DIAGNOSIS — R0789 Other chest pain: Secondary | ICD-10-CM | POA: Diagnosis not present

## 2019-07-02 DIAGNOSIS — R0602 Shortness of breath: Secondary | ICD-10-CM | POA: Insufficient documentation

## 2019-07-03 ENCOUNTER — Encounter: Payer: Self-pay | Admitting: Family Medicine

## 2019-07-26 ENCOUNTER — Other Ambulatory Visit (HOSPITAL_COMMUNITY)
Admission: RE | Admit: 2019-07-26 | Discharge: 2019-07-26 | Disposition: A | Discharge status: Home / Self Care | Payer: Medicare Other | Source: Ambulatory Visit | Attending: Family Medicine | Admitting: Family Medicine

## 2019-07-26 ENCOUNTER — Telehealth: Payer: Self-pay | Admitting: *Deleted

## 2019-07-26 ENCOUNTER — Other Ambulatory Visit: Payer: Self-pay | Admitting: *Deleted

## 2019-07-26 ENCOUNTER — Other Ambulatory Visit: Payer: Self-pay | Admitting: Family Medicine

## 2019-07-26 ENCOUNTER — Ambulatory Visit (INDEPENDENT_AMBULATORY_CARE_PROVIDER_SITE_OTHER): Payer: Medicare Other | Admitting: Family Medicine

## 2019-07-26 ENCOUNTER — Other Ambulatory Visit: Payer: Self-pay

## 2019-07-26 ENCOUNTER — Encounter: Payer: Self-pay | Admitting: Family Medicine

## 2019-07-26 ENCOUNTER — Ambulatory Visit (HOSPITAL_COMMUNITY)
Admission: RE | Admit: 2019-07-26 | Discharge: 2019-07-26 | Disposition: A | Discharge status: Home / Self Care | Payer: Medicare Other | Source: Ambulatory Visit | Attending: Family Medicine | Admitting: Family Medicine

## 2019-07-26 VITALS — BP 132/84 | Temp 97.7°F | Ht 63.0 in

## 2019-07-26 DIAGNOSIS — M25561 Pain in right knee: Secondary | ICD-10-CM | POA: Diagnosis not present

## 2019-07-26 DIAGNOSIS — M25551 Pain in right hip: Secondary | ICD-10-CM | POA: Diagnosis not present

## 2019-07-26 DIAGNOSIS — I10 Essential (primary) hypertension: Secondary | ICD-10-CM | POA: Diagnosis not present

## 2019-07-26 DIAGNOSIS — G8929 Other chronic pain: Secondary | ICD-10-CM | POA: Diagnosis not present

## 2019-07-26 DIAGNOSIS — R1011 Right upper quadrant pain: Secondary | ICD-10-CM | POA: Diagnosis not present

## 2019-07-26 DIAGNOSIS — E279 Disorder of adrenal gland, unspecified: Secondary | ICD-10-CM | POA: Diagnosis not present

## 2019-07-26 DIAGNOSIS — E278 Other specified disorders of adrenal gland: Secondary | ICD-10-CM

## 2019-07-26 DIAGNOSIS — M25552 Pain in left hip: Secondary | ICD-10-CM

## 2019-07-26 DIAGNOSIS — R634 Abnormal weight loss: Secondary | ICD-10-CM | POA: Diagnosis not present

## 2019-07-26 DIAGNOSIS — M25562 Pain in left knee: Secondary | ICD-10-CM

## 2019-07-26 DIAGNOSIS — K5900 Constipation, unspecified: Secondary | ICD-10-CM

## 2019-07-26 DIAGNOSIS — K8020 Calculus of gallbladder without cholecystitis without obstruction: Secondary | ICD-10-CM

## 2019-07-26 LAB — HEPATIC FUNCTION PANEL
ALT: 16 U/L (ref 0–44)
AST: 17 U/L (ref 15–41)
Albumin: 3.8 g/dL (ref 3.5–5.0)
Alkaline Phosphatase: 133 U/L — ABNORMAL HIGH (ref 38–126)
Bilirubin, Direct: <0.1 mg/dL (ref 0.0–0.2)
Total Bilirubin: 0.5 mg/dL (ref 0.3–1.2)
Total Protein: 7.3 g/dL (ref 6.5–8.1)

## 2019-07-26 LAB — CBC WITH DIFFERENTIAL/PLATELET
Abs Immature Granulocytes: 0.02 10*3/uL (ref 0.00–0.07)
Basophils Absolute: 0 10*3/uL (ref 0.0–0.1)
Basophils Relative: 0 %
Eosinophils Absolute: 0.1 10*3/uL (ref 0.0–0.5)
Eosinophils Relative: 1 %
HCT: 41 % (ref 36.0–46.0)
Hemoglobin: 12.8 g/dL (ref 12.0–15.0)
Immature Granulocytes: 0 %
Lymphocytes Relative: 27 %
Lymphs Abs: 2.5 10*3/uL (ref 0.7–4.0)
MCH: 28.8 pg (ref 26.0–34.0)
MCHC: 31.2 g/dL (ref 30.0–36.0)
MCV: 92.3 fL (ref 80.0–100.0)
Monocytes Absolute: 0.6 10*3/uL (ref 0.1–1.0)
Monocytes Relative: 6 %
Neutro Abs: 6.1 10*3/uL (ref 1.7–7.7)
Neutrophils Relative %: 66 %
Platelets: 343 10*3/uL (ref 150–400)
RBC: 4.44 MIL/uL (ref 3.87–5.11)
RDW: 14.2 % (ref 11.5–15.5)
WBC: 9.2 10*3/uL (ref 4.0–10.5)
nRBC: 0 % (ref 0.0–0.2)

## 2019-07-26 LAB — LIPASE, BLOOD: Lipase: 42 U/L (ref 11–51)

## 2019-07-26 MED ORDER — HYDROCODONE-ACETAMINOPHEN 5-325 MG PO TABS: ORAL_TABLET | ORAL | 0 refills | Status: DC

## 2019-07-26 NOTE — Progress Notes (Signed)
Subjective:    Patient ID: Elizabeth Aguilar, female    DOB: 1941/03/14, 78 y.o.   MRN: [id number]  Diabetes She presents for her follow-up diabetic visit. She has type 2 diabetes mellitus. Pertinent negatives for hypoglycemia include no confusion, dizziness or pallor. Pertinent negatives for diabetes include no chest pain, no fatigue, no polydipsia, no polyphagia and no weakness. Compliance with diabetes treatment: metformin. Home blood sugar record trend: pt states numbers have been good. Eye exam is current.    Pt states when she eats she has abdominal pain.  Patient relates right upper quadrant abdominal pain is very intense been going on the past couple days she does have a history of gallstone.  She denies any high fever chills sweats wheezing difficulty breathing.  She does relate flank pain abdominal pain some nausea no vomiting  Wants to follow up on yearly scan for growth on kidney.  She has had an adrenal gland this actually had a stable appearance on last scan but at the same time though there is concerned that it could grow plus also her weight has gone down over the past 12 months but only by a few pounds she states she gets intermittent abdominal pain and intermittent constipation issues we may need to do a CT scan because of this  Review of Systems  Constitutional: Negative for activity change, appetite change and fatigue.  HENT: Negative for congestion and rhinorrhea.   Respiratory: Negative for cough and shortness of breath.   Cardiovascular: Negative for chest pain and leg swelling.  Gastrointestinal: Positive for abdominal pain, constipation and nausea. Negative for abdominal distention, anal bleeding, blood in stool, diarrhea and vomiting.  Endocrine: Negative for polydipsia and polyphagia.  Skin: Negative for color change, pallor and rash.  Neurological: Negative for dizziness and weakness.  Psychiatric/Behavioral: Negative for behavioral problems and confusion.        Objective:   Physical Exam Vitals signs reviewed.  Constitutional:      General: She is not in acute distress. HENT:     Head: Normocephalic and atraumatic.  Eyes:     General:        Right eye: No discharge.        Left eye: No discharge.  Neck:     Trachea: No tracheal deviation.  Cardiovascular:     Rate and Rhythm: Normal rate and regular rhythm.     Heart sounds: Normal heart sounds. No murmur.  Pulmonary:     Effort: Pulmonary effort is normal. No respiratory distress.     Breath sounds: Normal breath sounds.  Abdominal:     General: Abdomen is flat.     Tenderness: There is abdominal tenderness. There is no right CVA tenderness, guarding or rebound.     Hernia: No hernia is present.  Lymphadenopathy:     Cervical: No cervical adenopathy.  Skin:    General: Skin is warm and dry.  Neurological:     Mental Status: She is alert.     Coordination: Coordination normal.  Psychiatric:        Behavior: Behavior normal.     This in the right upper quadrant      Assessment & Plan:  Hyperlipidemia most recent lab work in August showed LDL moderately elevated patient encouraged a healthy eating regular physical activity  1. RUQ pain She does have tenderness right upper quadrant she has history of gallstone we need to do ultrasound to see if there is acute cholecystitis and also do  lab work this is stat work Ultrasound overall look good except for the gallstone but no sign of active cholecystitis lab work looks good unable to reach the patient but message left on her phone we will call her again tomorrow - US Abdomen Limited RUQ - Lipase - CBC with Differential/Platelet - Hepatic function panel  2. Adrenal nodule (HCC) Adrenal nodule was felt to be stable and benign but with her weight loss constipation ongoing pain we will do CT scan of the abdomen await the results  3. Weight loss With slight weight loss patient is concerned about this if the lab work in the  gallbladder issue does not reveal further trouble we will recommend further looking into this by doing a CT scan of the abdomen and possible GI referral  4. Constipation, unspecified constipation type OTC measures for fiber and water may use MiraLAX as needed  5. Essential hypertension, benign Blood pressure under good control continue current measures continue medication watch salt stay active  6. 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  This statement verifies that 25 minutes was indeed spent with the patient.  More than 50% of this visit-total duration of the visit-was spent in counseling and coordination of care. The issues that the patient came in for today as reflected in the diagnosis (s) please refer to documentation for further details.

## 2019-07-26 NOTE — Telephone Encounter (Signed)
Called the drug store again since they did not return call and pharm states she is taking nexium 40mg  one daily. protonix taken off of med list.

## 2019-07-27 ENCOUNTER — Telehealth: Payer: Self-pay | Admitting: Family Medicine

## 2019-07-27 ENCOUNTER — Ambulatory Visit (HOSPITAL_COMMUNITY): Payer: Medicare Other

## 2019-07-27 NOTE — Addendum Note (Signed)
Addended by: Vicente Males on: 07/27/2019 04:06 PM   Modules accepted: Orders

## 2019-07-27 NOTE — Telephone Encounter (Signed)
Patient notified of results- see result notes

## 2019-07-27 NOTE — Telephone Encounter (Signed)
Pt returned call  Probable Dr. Nicki Reaper calling with pt's U/S results  Please advise & call pt

## 2019-07-27 NOTE — Telephone Encounter (Signed)
Please see result note for her ultrasound and lab work thank you-please notify patient

## 2019-07-27 NOTE — Telephone Encounter (Signed)
Sounds good thank you

## 2019-07-30 ENCOUNTER — Telehealth: Payer: Self-pay | Admitting: Family Medicine

## 2019-07-30 NOTE — Telephone Encounter (Signed)
[facility] - need to give patient appointment information for her CT scan  It is scheduled at [facility] for Wednesday, 08/11/2019, need to arrive 3:45pm, nothing to eat/drink 4 hours prior to scan   She will need to pick up the oral contrast from [facility], can go anytime but will need to day before (will come with drinking instructions)   If she needs to reschedule, she may call Centralized Scheduling [phone]

## 2019-08-04 DIAGNOSIS — Z23 Encounter for immunization: Secondary | ICD-10-CM | POA: Diagnosis not present

## 2019-08-04 NOTE — Telephone Encounter (Signed)
Burt Knack, Marko Plume, [place] E  Yes, she can have the water based contrast but she has to come 2 hours prior to her appointment time to drink it. This particular oral contrast can not be taken home like the white stuff that they pick up days before the exam. Just add to appointment notes that she will be coming early to drink clear contrast and we will know what that means.  Previous Messages  ----- Message -----  From: Cyndie Chime  Sent: 08/04/2019  9:44 AM EDT  To: Sammuel Bailiff   Question  This pt is scheduled for CT abd/pelv with contrast on 08/11/2019 - when I called pt to give her the instructions to pick up the oral contrast she states she can't drink the oral contrast because it makes her very sick  States that one time before someone brought her something to drink in a cup while she was waiting for her scan, states it was a clear liquid   Can you clarify this for me so I can inform the patient?   Thanks!

## 2019-08-04 NOTE — Telephone Encounter (Signed)
Called & notified pt to arrive 1:45pm to drink the clear contrast for her CT scan Noted in appt notes as well  Pt verbalized understanding

## 2019-08-04 NOTE — Telephone Encounter (Signed)
Pt states she's unable to take oral contrast due to it makes her very sick States before she was given a clear liquid to drink while she waited for her scan Sent message to CT for clarification

## 2019-08-11 ENCOUNTER — Other Ambulatory Visit: Payer: Self-pay

## 2019-08-11 ENCOUNTER — Ambulatory Visit (HOSPITAL_COMMUNITY)
Admission: RE | Admit: 2019-08-11 | Discharge: 2019-08-11 | Disposition: A | Discharge status: Home / Self Care | Payer: Medicare Other | Source: Ambulatory Visit | Attending: Family Medicine | Admitting: Family Medicine

## 2019-08-11 DIAGNOSIS — R634 Abnormal weight loss: Secondary | ICD-10-CM | POA: Diagnosis not present

## 2019-08-11 DIAGNOSIS — R1011 Right upper quadrant pain: Secondary | ICD-10-CM | POA: Diagnosis not present

## 2019-08-11 DIAGNOSIS — K449 Diaphragmatic hernia without obstruction or gangrene: Secondary | ICD-10-CM | POA: Diagnosis not present

## 2019-08-11 DIAGNOSIS — K8020 Calculus of gallbladder without cholecystitis without obstruction: Secondary | ICD-10-CM | POA: Diagnosis not present

## 2019-08-11 MED ORDER — IOHEXOL 300 MG/ML  SOLN
100.0000 mL | Freq: Once | INTRAMUSCULAR | Status: AC | PRN
  Administered 2019-08-11: 100 mL via INTRAVENOUS

## 2019-08-23 ENCOUNTER — Telehealth: Payer: Self-pay | Admitting: Family Medicine

## 2019-08-23 MED ORDER — ESOMEPRAZOLE MAGNESIUM 40 MG PO CPDR: DELAYED_RELEASE_CAPSULE | ORAL | 1 refills | Status: DC

## 2019-08-23 NOTE — Telephone Encounter (Signed)
Pt returned call and was told that med was sent to pharmacy. Pt verbalized understanding.

## 2019-08-23 NOTE — Telephone Encounter (Signed)
Patient called in and I told Nexium was called into the pharmacy and she understood.

## 2019-08-23 NOTE — Telephone Encounter (Signed)
From my point of view it is fine for the patient to be on Nexium Stop Protonix Nexium 40 mg daily #30 with 5 refills It is possible insurance company may only pay for [employer]

## 2019-08-23 NOTE — Telephone Encounter (Signed)
Protonix is not helping, up all night with burning from acid.  Patient would rather go back to the Nexium.   [facility]

## 2019-08-23 NOTE — Telephone Encounter (Signed)
Nexium sent to pharmacy. Left message to return call

## 2019-08-23 NOTE — Telephone Encounter (Signed)
Please advise. Thank you

## 2019-08-26 DIAGNOSIS — M2550 Pain in unspecified joint: Secondary | ICD-10-CM | POA: Diagnosis not present

## 2019-08-26 DIAGNOSIS — M150 Primary generalized (osteo)arthritis: Secondary | ICD-10-CM | POA: Diagnosis not present

## 2019-08-26 DIAGNOSIS — M545 Low back pain: Secondary | ICD-10-CM | POA: Diagnosis not present

## 2019-08-26 DIAGNOSIS — Z1589 Genetic susceptibility to other disease: Secondary | ICD-10-CM | POA: Diagnosis not present

## 2019-08-26 DIAGNOSIS — M461 Sacroiliitis, not elsewhere classified: Secondary | ICD-10-CM | POA: Diagnosis not present

## 2019-09-03 ENCOUNTER — Encounter: Payer: Self-pay | Admitting: Cardiovascular Disease

## 2019-09-03 ENCOUNTER — Other Ambulatory Visit: Payer: Self-pay

## 2019-09-03 ENCOUNTER — Ambulatory Visit (INDEPENDENT_AMBULATORY_CARE_PROVIDER_SITE_OTHER): Payer: Medicare Other | Admitting: Cardiovascular Disease

## 2019-09-03 DIAGNOSIS — K219 Gastro-esophageal reflux disease without esophagitis: Secondary | ICD-10-CM

## 2019-09-03 DIAGNOSIS — I2102 ST elevation (STEMI) myocardial infarction involving left anterior descending coronary artery: Secondary | ICD-10-CM

## 2019-09-03 DIAGNOSIS — Z9861 Coronary angioplasty status: Secondary | ICD-10-CM

## 2019-09-03 DIAGNOSIS — E785 Hyperlipidemia, unspecified: Secondary | ICD-10-CM

## 2019-09-03 DIAGNOSIS — E1121 Type 2 diabetes mellitus with diabetic nephropathy: Secondary | ICD-10-CM | POA: Diagnosis not present

## 2019-09-03 DIAGNOSIS — I255 Ischemic cardiomyopathy: Secondary | ICD-10-CM

## 2019-09-03 DIAGNOSIS — I2510 Atherosclerotic heart disease of native coronary artery without angina pectoris: Secondary | ICD-10-CM | POA: Diagnosis not present

## 2019-09-03 DIAGNOSIS — I10 Essential (primary) hypertension: Secondary | ICD-10-CM

## 2019-09-03 MED ORDER — ATORVASTATIN CALCIUM 40 MG PO TABS: ORAL_TABLET | ORAL | 3 refills | Status: DC

## 2019-09-03 MED ORDER — EZETIMIBE 10 MG PO TABS: 10.0000 mg | ORAL_TABLET | Freq: Every day | ORAL | 3 refills | Status: DC
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
## 2019-09-03 NOTE — Patient Instructions (Signed)
Medication Instructions:  START ZETIA 10MG  AFTER DINNER AND BEFORE BEDTIME If you need a refill on your cardiac medications before your next appointment, please call your pharmacy.  Follow-Up: IN 12 months Please call our office 2 months in advance, AUG 2021 to schedule this OCT 2021 appointment. In Person You may see Shelva Majestic, MD or one of the following Advanced Practice Providers on your designated Care Team:  Rosaria Ferries, PA-C Jory Sims, DNP, ANP Cadence Kathlen Mody, NP.    At [facility], you and your health needs are our priority.  As part of our continuing mission to provide you with exceptional heart care, we have created designated Provider Care Teams.  These Care Teams include your primary Cardiologist (physician) and Advanced Practice Providers (APPs -  Physician Assistants and Nurse Practitioners) who all work together to provide you with the care you need, when you need it.  Thank you for choosing [facility]!!

## 2019-09-08 ENCOUNTER — Other Ambulatory Visit: Payer: Self-pay | Admitting: Family Medicine

## 2019-09-08 ENCOUNTER — Telehealth: Payer: Self-pay | Admitting: Family Medicine

## 2019-09-08 MED ORDER — PENICILLIN V POTASSIUM 500 MG PO TABS: 500.0000 mg | ORAL_TABLET | Freq: Four times a day (QID) | ORAL | 0 refills | Status: DC

## 2019-09-08 NOTE — Telephone Encounter (Signed)
Patient stated that she is not allergic to penicillin and has taken it before. Prescription sent electronically to pharmacy. Patient aware.

## 2019-09-08 NOTE — Telephone Encounter (Signed)
This patient lists 14 different allergies many of them are medications To the best of my ability patient is not allergic to penicillin but please double check with patient If patient allergic to penicillin please let me know Otherwise Penicillin VK 500 mg 1 pill taken 4 times daily for the next 7 days

## 2019-09-08 NOTE — Telephone Encounter (Signed)
Patient wanting some penisulin called in for abscess tooth. She has been trying to get appointment at dentist to get it pulled[facility] [place]

## 2019-10-22 ENCOUNTER — Encounter: Payer: Self-pay | Admitting: Nurse Practitioner

## 2019-10-22 ENCOUNTER — Ambulatory Visit (INDEPENDENT_AMBULATORY_CARE_PROVIDER_SITE_OTHER): Payer: Medicare Other | Admitting: Nurse Practitioner

## 2019-10-22 ENCOUNTER — Other Ambulatory Visit: Payer: Self-pay

## 2019-10-22 DIAGNOSIS — B9689 Other specified bacterial agents as the cause of diseases classified elsewhere: Secondary | ICD-10-CM | POA: Diagnosis not present

## 2019-10-22 DIAGNOSIS — J019 Acute sinusitis, unspecified: Secondary | ICD-10-CM

## 2019-10-22 MED ORDER — CEPHALEXIN 500 MG PO CAPS: 500.0000 mg | ORAL_CAPSULE | Freq: Four times a day (QID) | ORAL | 0 refills | Status: DC

## 2019-10-22 NOTE — Progress Notes (Signed)
  Phone visit Subjective:    Patient ID: Elizabeth Aguilar, female    DOB: 08/24/1941, 78 y.o.   MRN: [id number]  Sinusitis This is a new problem. Episode onset: 3 days. Associated symptoms include congestion and sinus pressure. Past treatments include nothing.   Virtual Visit via Telephone Note  I connected with Elizabeth Aguilar on 10/22/19 at  3:20 PM EST by telephone and verified that I am speaking with the correct person using two identifiers.  Location: Patient: home Provider: office   I discussed the limitations, risks, security and privacy concerns of performing an evaluation and management service by telephone and the availability of in person appointments. I also discussed with the patient that there may be a patient responsible charge related to this service. The patient expressed understanding and agreed to proceed.   History of Present Illness: Presents for complaints of sinus symptoms that began 2 days ago.  Pain in the facial area above the eyes and along the cheeks.  Had a runny nose yesterday, none today.  Yellow mucus at times.  Postnasal drainage.  No fever ear pain sore throat or cough.  Facial pain is worse with bending.  Is not currently taking her loratadine or her Flonase.   Observations/Objective: Today's visit was via telephone Physical exam was not possible for this visit   Assessment and Plan: Acute bacterial rhinosinusitis  Meds ordered this encounter  Medications  . cephALEXin (KEFLEX) 500 MG capsule    Sig: Take 1 capsule (500 mg total) by mouth 4 (four) times daily.    Dispense:  40 capsule    Refill:  0    Order Specific Question:   Supervising Provider    Answer:   Sallee Lange A [[id number]]     Follow Up Instructions: Restart Flonase and loratadine as directed.  Call back in 7 to 10 days if no improvement, sooner if worse.   I discussed the assessment and treatment plan with the patient. The patient was provided an opportunity to ask questions and all  were answered. The patient agreed with the plan and demonstrated an understanding of the instructions.   The patient was advised to call back or seek an in-person evaluation if the symptoms worsen or if the condition fails to improve as anticipated.  I provided 15 minutes of non-face-to-face time during this encounter.        Review of Systems  HENT: Positive for congestion and sinus pressure.        Objective:   Physical Exam        Assessment & Plan:

## 2019-10-24 ENCOUNTER — Other Ambulatory Visit: Payer: Self-pay | Admitting: Family Medicine

## 2019-10-25 ENCOUNTER — Other Ambulatory Visit: Payer: Self-pay

## 2019-10-25 ENCOUNTER — Ambulatory Visit (INDEPENDENT_AMBULATORY_CARE_PROVIDER_SITE_OTHER): Payer: Medicare Other | Admitting: Family Medicine

## 2019-10-25 DIAGNOSIS — G8929 Other chronic pain: Secondary | ICD-10-CM

## 2019-10-25 DIAGNOSIS — M549 Dorsalgia, unspecified: Secondary | ICD-10-CM

## 2019-10-25 DIAGNOSIS — E1121 Type 2 diabetes mellitus with diabetic nephropathy: Secondary | ICD-10-CM

## 2019-10-25 DIAGNOSIS — I10 Essential (primary) hypertension: Secondary | ICD-10-CM

## 2019-10-25 DIAGNOSIS — N289 Disorder of kidney and ureter, unspecified: Secondary | ICD-10-CM | POA: Diagnosis not present

## 2019-10-25 DIAGNOSIS — E7849 Other hyperlipidemia: Secondary | ICD-10-CM | POA: Diagnosis not present

## 2019-10-25 MED ORDER — METOPROLOL SUCCINATE ER 100 MG PO TB24: ORAL_TABLET | ORAL | 1 refills | Status: DC

## 2019-10-25 MED ORDER — HYDROCODONE-ACETAMINOPHEN 5-325 MG PO TABS: ORAL_TABLET | ORAL | 0 refills | Status: DC

## 2019-10-25 MED ORDER — ONDANSETRON HCL 8 MG PO TABS: ORAL_TABLET | ORAL | 5 refills | Status: DC

## 2019-10-25 MED ORDER — METFORMIN HCL 500 MG PO TABS: ORAL_TABLET | ORAL | 1 refills | Status: DC

## 2019-10-25 MED ORDER — ALPRAZOLAM 0.5 MG PO TABS: ORAL_TABLET | ORAL | 5 refills | Status: DC

## 2019-10-25 NOTE — Progress Notes (Signed)
Subjective:    Patient ID: Elizabeth Aguilar, female    DOB: May 09, 1941, 78 y.o.   MRN: [id number]  Diabetes She presents for her follow-up diabetic visit. She has type 2 diabetes mellitus. Pertinent negatives for hypoglycemia include no confusion or dizziness. Pertinent negatives for diabetes include no chest pain, no fatigue, no polydipsia, no polyphagia and no weakness. Risk factors for coronary artery disease include dyslipidemia, diabetes mellitus, hypertension and post-menopausal. Current diabetic treatment includes oral agent (monotherapy). She is compliant with treatment all of the time. She is following a diabetic diet.   Patient recently was given 80 mg of Lipitor when she states she is on 40 mg I encouraged her to talk with her cardiologist regarding this  She does relate compliance with her blood pressure medicine watches salt in her diet Fall Risk  01/12/2019 12/31/2017 10/16/2017 09/06/2016 03/08/2016  Falls in the past year? 0 No Yes No No  Comment - - Emmi Telephone Survey: data to providers prior to load - -  Number falls in past yr: 0 - 1 - -  Comment - - Emmi Telephone Survey Actual Response = 1 - -  Injury with Fall? 0 - Yes - -  Follow up Falls evaluation completed - - - -    She is trying to protect herself against Covid  She does have chronic pain she takes her medicine up to 4 times a day she denies abusing it denies side effects states the pain medicine does help her function better  She also has arthritis that she follows with a specialist   Review of Systems  Constitutional: Negative for activity change, appetite change and fatigue.  HENT: Negative for congestion and rhinorrhea.   Respiratory: Negative for cough and shortness of breath.   Cardiovascular: Negative for chest pain and leg swelling.  Gastrointestinal: Negative for abdominal pain and diarrhea.  Endocrine: Negative for polydipsia and polyphagia.  Skin: Negative for color change.  Neurological: Negative  for dizziness and weakness.  Psychiatric/Behavioral: Negative for behavioral problems and confusion.   Virtual Visit via Video Note  I connected with Elizabeth Aguilar on 10/25/19 at  1:10 PM EST by a video enabled telemedicine application and verified that I am speaking with the correct person using two identifiers.  Location: Patient: home Provider: office   I discussed the limitations of evaluation and management by telemedicine and the availability of in person appointments. The patient expressed understanding and agreed to proceed.  History of Present Illness:    Observations/Objective:   Assessment and Plan:   Follow Up Instructions:    I discussed the assessment and treatment plan with the patient. The patient was provided an opportunity to ask questions and all were answered. The patient agreed with the plan and demonstrated an understanding of the instructions.   The patient was advised to call back or seek an in-person evaluation if the symptoms worsen or if the condition fails to improve as anticipated.  I provided 23 minutes of non-face-to-face time during this encounter.        Objective:   Physical Exam  Today's visit was via telephone Physical exam was not possible for this visit       Assessment & Plan:  1. Controlled type 2 diabetes mellitus with diabetic nephropathy, without long-term current use of insulin ([place]) Diabetes under decent control continue current measures  2. Renal insufficiency Renal insufficiency continue current measures watch diet stay well-hydrated  3. Other hyperlipidemia Hyperlipidemia no need to  do lab work currently healthy diet recommended  4. Other chronic back pain Chronic back pain 3 prescriptions for hydrocodone were sent in drug registry was checked see above  5. Essential hypertension, benign Blood pressure decent control continue current measures  Follow-up within 6 months sooner problems

## 2019-12-14 ENCOUNTER — Other Ambulatory Visit: Payer: Self-pay

## 2019-12-14 ENCOUNTER — Ambulatory Visit (INDEPENDENT_AMBULATORY_CARE_PROVIDER_SITE_OTHER): Payer: Medicare Other | Admitting: Family Medicine

## 2019-12-14 DIAGNOSIS — G8929 Other chronic pain: Secondary | ICD-10-CM

## 2019-12-14 DIAGNOSIS — K219 Gastro-esophageal reflux disease without esophagitis: Secondary | ICD-10-CM

## 2019-12-14 DIAGNOSIS — E1121 Type 2 diabetes mellitus with diabetic nephropathy: Secondary | ICD-10-CM

## 2019-12-14 DIAGNOSIS — I10 Essential (primary) hypertension: Secondary | ICD-10-CM | POA: Diagnosis not present

## 2019-12-14 DIAGNOSIS — N289 Disorder of kidney and ureter, unspecified: Secondary | ICD-10-CM

## 2019-12-14 DIAGNOSIS — M1990 Unspecified osteoarthritis, unspecified site: Secondary | ICD-10-CM

## 2019-12-14 DIAGNOSIS — R5383 Other fatigue: Secondary | ICD-10-CM | POA: Diagnosis not present

## 2019-12-14 DIAGNOSIS — M5440 Lumbago with sciatica, unspecified side: Secondary | ICD-10-CM | POA: Diagnosis not present

## 2019-12-14 LAB — POCT GLYCOSYLATED HEMOGLOBIN (HGB A1C): Hemoglobin A1C: 5.2 % (ref 4.0–5.6)

## 2019-12-14 MED ORDER — FUROSEMIDE 20 MG PO TABS: ORAL_TABLET | ORAL | 1 refills | Status: DC

## 2019-12-14 NOTE — Progress Notes (Signed)
Subjective:    Patient ID: Elizabeth Aguilar, female    DOB: [birth year], 79 y.o.   MRN: [id number] Face-to-face evaluation was completed today HPI Pt here for face to face visit. Pt states she is needing a face to face in order to have diabetic supplies refilled(lancets and strip). Lancets and strips are to go to [facility]. No issues or concerns. Patient has type 2 diabetes.  Takes her medication on a regular basis.  States she watches her diet closely.  Her energy level has not been too great lately but she denies any major setbacks denies rectal bleeding denies difficulty breathing chest tightness pressure pain.  She has not been able to check her sugars recently because she ran out of strips.  She does have chronic pain and discomfort with her back as well as arthritis and she is good about taking her medications denies any problems She also has hyperlipidemia she does take her medicines on a regular basis Controlled type 2 diabetes mellitus with diabetic nephropathy, without long-term current use of insulin ([place]) - Plan: POCT HgB A1C  Essential hypertension, benign  Gastroesophageal reflux disease without esophagitis  Arthritis  Renal insufficiency  Chronic midline low back pain with sciatica, sciatica laterality unspecified    Review of Systems  Constitutional: Positive for fatigue. Negative for activity change and appetite change.  HENT: Negative for congestion and rhinorrhea.   Respiratory: Negative for cough and shortness of breath.   Cardiovascular: Negative for chest pain and leg swelling.  Gastrointestinal: Negative for abdominal pain and diarrhea.  Endocrine: Negative for polydipsia and polyphagia.  Skin: Negative for color change.  Neurological: Negative for dizziness and weakness.  Psychiatric/Behavioral: Negative for behavioral problems and confusion.       Objective:   Physical Exam Vitals reviewed.  Constitutional:      General: She is not in acute  distress. HENT:     Head: Normocephalic and atraumatic.  Eyes:     General:        Right eye: No discharge.        Left eye: No discharge.  Neck:     Trachea: No tracheal deviation.  Cardiovascular:     Rate and Rhythm: Normal rate and regular rhythm.     Heart sounds: Normal heart sounds. No murmur.  Pulmonary:     Effort: Pulmonary effort is normal. No respiratory distress.     Breath sounds: Normal breath sounds.  Lymphadenopathy:     Cervical: No cervical adenopathy.  Skin:    General: Skin is warm and dry.  Neurological:     Mental Status: She is alert.     Coordination: Coordination normal.  Psychiatric:        Behavior: Behavior normal.           Assessment & Plan:  1. Controlled type 2 diabetes mellitus with diabetic nephropathy, without long-term current use of insulin ([facility]) Diabetes actually under better control currently A1c is 5.2 I encourage her to check her sugars no more than 1 time per day on a regular basis - POCT HgB A1C  2. Essential hypertension, benign Blood pressure has been under good control she is to continue to watch diet avoid excessive salt  3. Gastroesophageal reflux disease without esophagitis Reflux under good control takes her medicine regular basis has no setbacks or problems recently  4. Arthritis Her arthritis plagues her on a daily basis mainly in her arms hips lower back and hands patient does use ibuprofen occasionally.  Also uses Tylenol denies any bleeding issues  5. Renal insufficiency Patient is due for blood work this spring she would prefer not to do the blood work currently during this [place] cases regionally  6. Chronic midline low back pain with sciatica, sciatica laterality unspecified Patient with low back pain stretching exercises continue pain medicine she already has refills The patient will also do a lipid profile previous lab reviewed with patient you lab order Also do liver function because of  medications previous lab reviewed Kidney function and CBC previous labs reviewed await the results of these new labs On intention plans

## 2019-12-17 ENCOUNTER — Telehealth: Payer: Self-pay | Admitting: Family Medicine

## 2019-12-17 MED ORDER — MECLIZINE HCL 25 MG PO TABS: ORAL_TABLET | ORAL | 0 refills | Status: DC

## 2019-12-17 NOTE — Telephone Encounter (Signed)
Meclizine 25 mg 1 3 times daily as needed dizziness, #21, caution drowsiness  Glad she is getting her Covid vaccine

## 2019-12-17 NOTE — Telephone Encounter (Signed)
Please advise. Thank you

## 2019-12-17 NOTE — Telephone Encounter (Signed)
Pt states she needs something called in for vertigo. She is very swimmy headed. She is going to get covid shot in [place] today.   [facility] [place] [place]

## 2019-12-17 NOTE — Telephone Encounter (Signed)
Medication sent in and pt is aware  

## 2019-12-27 ENCOUNTER — Telehealth: Payer: Self-pay | Admitting: Family Medicine

## 2019-12-27 NOTE — Telephone Encounter (Signed)
Pt contacted and verbalized understanding. Pt transferred up front to set up in office visit this week

## 2019-12-27 NOTE — Telephone Encounter (Signed)
Pt states that since her last visit with Dr. Nicki Reaper - "my head ain't right"  Pt states she feels faint when she lays down, when the water touches her head in the shower, or when standing from sitting & dizzy  Pt states she's monitoring her BP at home & her numbers are fine  Please advise & call pt     [facility]

## 2019-12-27 NOTE — Telephone Encounter (Signed)
Pt contacted. Pt states that when she gets up she feels swimmy headed/dizzy and like she is going to fall face forward. When pt lies down at night and when in shower, she feels the same way. Pt states that is she lays on the right side of face it feels like she is going to pass out. Has been nauseated. Dull headache that goes away in about an hour. Going on for 2 weeks. Pt thought she had vertigo. Back pain in lower area on left side that moves to right side. Pt states she believes the back pain is arthritis. Please advise. Thank you

## 2019-12-27 NOTE — Telephone Encounter (Signed)
So it is virtually impossible to know for certain what is causing this via a phone visit I would recommend an office visit and that way we can check her blood pressure lying sitting standing this can be done at her convenience sometime this week

## 2019-12-30 ENCOUNTER — Ambulatory Visit: Payer: Medicare Other | Admitting: Family Medicine

## 2020-01-05 ENCOUNTER — Ambulatory Visit (INDEPENDENT_AMBULATORY_CARE_PROVIDER_SITE_OTHER): Payer: Medicare Other | Admitting: Family Medicine

## 2020-01-05 ENCOUNTER — Other Ambulatory Visit: Payer: Self-pay

## 2020-01-05 ENCOUNTER — Encounter: Payer: Self-pay | Admitting: Family Medicine

## 2020-01-05 VITALS — BP 134/78 | Temp 95.4°F | Wt 157.4 lb

## 2020-01-05 DIAGNOSIS — I10 Essential (primary) hypertension: Secondary | ICD-10-CM | POA: Diagnosis not present

## 2020-01-05 DIAGNOSIS — I951 Orthostatic hypotension: Secondary | ICD-10-CM | POA: Diagnosis not present

## 2020-01-05 DIAGNOSIS — H9193 Unspecified hearing loss, bilateral: Secondary | ICD-10-CM

## 2020-01-05 DIAGNOSIS — R5383 Other fatigue: Secondary | ICD-10-CM | POA: Diagnosis not present

## 2020-01-05 DIAGNOSIS — N289 Disorder of kidney and ureter, unspecified: Secondary | ICD-10-CM | POA: Diagnosis not present

## 2020-01-05 DIAGNOSIS — E1121 Type 2 diabetes mellitus with diabetic nephropathy: Secondary | ICD-10-CM | POA: Diagnosis not present

## 2020-01-05 MED ORDER — LISINOPRIL 2.5 MG PO TABS: 2.5000 mg | ORAL_TABLET | Freq: Every day | ORAL | 1 refills | Status: DC

## 2020-01-05 MED ORDER — METOPROLOL SUCCINATE ER 50 MG PO TB24: ORAL_TABLET | ORAL | 1 refills | Status: DC

## 2020-01-05 NOTE — Progress Notes (Signed)
   Subjective:    Patient ID: Elizabeth Aguilar, female    DOB: [birth year], 79 y.o.   MRN: [id number]  Hypertension This is a chronic problem. (Dizziness, swimmy headed. ) There are no compliance problems.    Pt states she has become swimmy headed and has fell a few times due to swimmy headedness. Pt states that when she gets up fast she begins to feel dizzy.  Pt fell yesterday while hanging out clothes. Knees, back, arm sore. Pt states that when she had vertigo she was prescribed some med and that did help her. Pt states that when she takes a shower and leans head back, she becomes dizzy.     Review of Systems Patient relates dizziness when she stands up weakness fatigue tiredness she denies muscle aches or discomfort has not gotten her labs done denies nausea vomiting diarrhea fever chills sweats    Objective:   Physical Exam  Lungs clear heart regular no crackles no murmurs pulses normal extremities no edema skin warm dry Patient does complain of decreased hearing but her eardrums appear normal referral to ENT for further evaluation     Assessment & Plan:  Patient never went to get her lab work from the other week given her symptoms of fatigue tiredness dizziness and orthostatic hypotension it is important for her to do her lab work she states she will get it drawn today hopefully  Orthostatic hypotension I believe this is related to her medication.  I recommend reducing her metoprolol from 100 mg bring now down to 50 mg in addition to this reduce the lisinopril from 5 mg to 2.5 mg  Patient does have pain medication from previous prescription she will continue that and follow-up in April  Diabetes under decent control she will do the lab work that was recommended

## 2020-01-06 LAB — CBC WITH DIFFERENTIAL/PLATELET
Basophils Absolute: 0 10*3/uL (ref 0.0–0.2)
Basos: 0 %
EOS (ABSOLUTE): 0.1 10*3/uL (ref 0.0–0.4)
Eos: 2 %
Hematocrit: 37.9 % (ref 34.0–46.6)
Hemoglobin: 12.7 g/dL (ref 11.1–15.9)
Immature Grans (Abs): 0 10*3/uL (ref 0.0–0.1)
Immature Granulocytes: 0 %
Lymphocytes Absolute: 2.9 10*3/uL (ref 0.7–3.1)
Lymphs: 35 %
MCH: 29.3 pg (ref 26.6–33.0)
MCHC: 33.5 g/dL (ref 31.5–35.7)
MCV: 88 fL (ref 79–97)
Monocytes Absolute: 0.5 10*3/uL (ref 0.1–0.9)
Monocytes: 6 %
Neutrophils Absolute: 4.7 10*3/uL (ref 1.4–7.0)
Neutrophils: 57 %
Platelets: 336 10*3/uL (ref 150–450)
RBC: 4.33 x10E6/uL (ref 3.77–5.28)
RDW: 13.9 % (ref 11.7–15.4)
WBC: 8.3 10*3/uL (ref 3.4–10.8)

## 2020-01-06 LAB — HEPATIC FUNCTION PANEL
ALT: 18 IU/L (ref 0–32)
AST: 20 IU/L (ref 0–40)
Albumin: 4.1 g/dL (ref 3.7–4.7)
Alkaline Phosphatase: 157 IU/L — ABNORMAL HIGH (ref 39–117)
Bilirubin Total: <0.2 mg/dL (ref 0.0–1.2)
Bilirubin, Direct: 0.06 mg/dL (ref 0.00–0.40)
Total Protein: 6.5 g/dL (ref 6.0–8.5)

## 2020-01-06 LAB — BASIC METABOLIC PANEL
BUN/Creatinine Ratio: 17 (ref 12–28)
BUN: 18 mg/dL (ref 8–27)
CO2: 25 mmol/L (ref 20–29)
Calcium: 9.8 mg/dL (ref 8.7–10.3)
Chloride: 101 mmol/L (ref 96–106)
Creatinine, Ser: 1.08 mg/dL — ABNORMAL HIGH (ref 0.57–1.00)
GFR calc Af Amer: 56 mL/min/{1.73_m2} — ABNORMAL LOW (ref >59)
GFR calc non Af Amer: 49 mL/min/{1.73_m2} — ABNORMAL LOW (ref >59)
Glucose: 101 mg/dL — ABNORMAL HIGH (ref 65–99)
Potassium: 4.3 mmol/L (ref 3.5–5.2)
Sodium: 143 mmol/L (ref 134–144)

## 2020-01-06 LAB — LIPID PANEL
Chol/HDL Ratio: 3.1 ratio (ref 0.0–4.4)
Cholesterol, Total: 183 mg/dL (ref 100–199)
HDL: 60 mg/dL (ref >39)
LDL Chol Calc (NIH): 100 mg/dL — ABNORMAL HIGH (ref 0–99)
Triglycerides: 132 mg/dL (ref 0–149)
VLDL Cholesterol Cal: 23 mg/dL (ref 5–40)

## 2020-01-08 ENCOUNTER — Encounter: Payer: Self-pay | Admitting: Family Medicine

## 2020-01-11 NOTE — Addendum Note (Signed)
Addended by: Dairl Ponder on: 01/11/2020 04:09 PM   Modules accepted: Orders

## 2020-01-18 ENCOUNTER — Encounter: Payer: Self-pay | Admitting: Family Medicine

## 2020-01-21 DIAGNOSIS — H9113 Presbycusis, bilateral: Secondary | ICD-10-CM | POA: Insufficient documentation

## 2020-01-21 DIAGNOSIS — H903 Sensorineural hearing loss, bilateral: Secondary | ICD-10-CM | POA: Diagnosis not present

## 2020-01-21 DIAGNOSIS — H8111 Benign paroxysmal vertigo, right ear: Secondary | ICD-10-CM | POA: Insufficient documentation

## 2020-01-24 DIAGNOSIS — R42 Dizziness and giddiness: Secondary | ICD-10-CM | POA: Diagnosis not present

## 2020-02-02 ENCOUNTER — Ambulatory Visit (INDEPENDENT_AMBULATORY_CARE_PROVIDER_SITE_OTHER): Payer: Medicare Other | Admitting: Family Medicine

## 2020-02-02 ENCOUNTER — Other Ambulatory Visit: Payer: Self-pay

## 2020-02-02 ENCOUNTER — Encounter: Payer: Self-pay | Admitting: Family Medicine

## 2020-02-02 VITALS — BP 128/80 | Temp 97.5°F | Ht 65.0 in | Wt 160.0 lb

## 2020-02-02 DIAGNOSIS — G8929 Other chronic pain: Secondary | ICD-10-CM | POA: Diagnosis not present

## 2020-02-02 DIAGNOSIS — M549 Dorsalgia, unspecified: Secondary | ICD-10-CM | POA: Diagnosis not present

## 2020-02-02 DIAGNOSIS — I10 Essential (primary) hypertension: Secondary | ICD-10-CM | POA: Diagnosis not present

## 2020-02-02 MED ORDER — HYDROCODONE-ACETAMINOPHEN 5-325 MG PO TABS: ORAL_TABLET | ORAL | 0 refills | Status: DC

## 2020-02-02 NOTE — Patient Instructions (Addendum)
Stop lisinopril  Recheck here in 3 months  If dizziness not doing better in 1 month please call to recheck sooner

## 2020-02-02 NOTE — Progress Notes (Signed)
Subjective:    Patient ID: Elizabeth Aguilar, female    DOB: 09-06-1941, 79 y.o.   MRN: [id number]  HPIFollow up on dizziness. Pt states she saw ENT. States dizziness is some better but still having it. bp med was decreased at last visit 4 weeks ago.  The ENT did a test on her which pointed away from inner ear  This patient was seen today for chronic pain  The medication list was reviewed and updated.   -Compliance with medication: She relates compliance with medicine drug registry was checked  - Number patient states they take daily: 3 or 4 daily  -when was the last dose patient took?  No problems recently.  Took 1 earlier today states pain medicine makes her pain bearable and therefore she is able to function better denies drowsiness  The patient was advised the importance of maintaining medication and not using illegal substances with these.  Here for refills and follow up  The patient was educated that we can provide 3 monthly scripts for their medication, it is their responsibility to follow the instructions.  Side effects or complications from medications: Denies side effects currently  Patient is aware that pain medications are meant to minimize the severity of the pain to allow their pain levels to improve to allow for better function. They are aware of that pain medications cannot totally remove their pain.  Due for UDT ( at least once per year) : Will need 1 later this year        Review of Systems  Constitutional: Negative for activity change, appetite change and fatigue.  HENT: Negative for congestion and rhinorrhea.   Respiratory: Negative for cough and shortness of breath.   Cardiovascular: Negative for chest pain and leg swelling.  Gastrointestinal: Negative for abdominal pain and diarrhea.  Endocrine: Negative for polydipsia and polyphagia.  Skin: Negative for color change.  Neurological: Positive for dizziness. Negative for weakness.  Psychiatric/Behavioral:  Negative for behavioral problems and confusion.       Objective:   Physical Exam Vitals reviewed.  Constitutional:      General: She is not in acute distress. HENT:     Head: Normocephalic and atraumatic.  Eyes:     General:        Right eye: No discharge.        Left eye: No discharge.  Neck:     Trachea: No tracheal deviation.  Cardiovascular:     Rate and Rhythm: Normal rate and regular rhythm.     Heart sounds: Normal heart sounds. No murmur.  Pulmonary:     Effort: Pulmonary effort is normal. No respiratory distress.     Breath sounds: Normal breath sounds.  Lymphadenopathy:     Cervical: No cervical adenopathy.  Skin:    General: Skin is warm and dry.  Neurological:     Mental Status: She is alert.     Coordination: Coordination normal.  Psychiatric:        Behavior: Behavior normal.    bp 118 /70 sitting  106/60 standing  Fall Risk  01/05/2020 10/25/2019 01/12/2019 12/31/2017 10/16/2017  Falls in the past year? 1 0 0 No Yes  Comment - - - - Emmi Telephone Survey: data to providers prior to load  Number falls in past yr: 1 - 0 - 1  Comment - - - - Emmi Telephone Survey Actual Response = 1  Injury with Fall? 1 - 0 - Yes  Risk for fall due to :  Impaired balance/gait - - - -  Follow up Education provided Falls evaluation completed Falls evaluation completed - -        Assessment & Plan:  1. Essential hypertension, benign Blood pressure under good control continue current measures.  I do not feel that is causing her dizziness currently.  2. Other chronic back pain Chronic low back pain refills on pain medications were given  The patient was seen in followup for chronic pain. A review over at their current pain status was discussed. Drug registry was checked. Prescriptions were given.  Regular follow-up recommended. Discussion was held regarding the importance of compliance with medication as well as pain medication contract.  Patient was informed that  medication may cause drowsiness and should not be combined  with other medications/alcohol or street drugs. If the patient feels medication is causing altered alertness then do not drive or operate dangerous equipment.

## 2020-03-01 ENCOUNTER — Telehealth: Payer: Self-pay | Admitting: Family Medicine

## 2020-03-01 MED ORDER — CLINDAMYCIN HCL 300 MG PO CAPS: ORAL_CAPSULE | ORAL | 0 refills | Status: DC

## 2020-03-01 NOTE — Telephone Encounter (Signed)
Left message to return call 

## 2020-03-01 NOTE — Telephone Encounter (Signed)
Medication sent to pharmacy and pt is aware 

## 2020-03-01 NOTE — Telephone Encounter (Signed)
Patient says she has 2 abscessed teeth and wanted to know if we would call in an antibiotic.  [facility] [place]

## 2020-03-01 NOTE — Telephone Encounter (Signed)
Pt states she does not have a dentist at the times. Pt states mouth pain started earlier this week. Please advise. Thank you.

## 2020-03-01 NOTE — Telephone Encounter (Signed)
Clindamycin 300 one tid for 7 days See dentist (she will need to call around)

## 2020-03-02 DIAGNOSIS — E663 Overweight: Secondary | ICD-10-CM | POA: Diagnosis not present

## 2020-03-02 DIAGNOSIS — Z6829 Body mass index (BMI) 29.0-29.9, adult: Secondary | ICD-10-CM | POA: Diagnosis not present

## 2020-03-02 DIAGNOSIS — M2550 Pain in unspecified joint: Secondary | ICD-10-CM | POA: Diagnosis not present

## 2020-03-02 DIAGNOSIS — M545 Low back pain: Secondary | ICD-10-CM | POA: Diagnosis not present

## 2020-03-02 DIAGNOSIS — Z1589 Genetic susceptibility to other disease: Secondary | ICD-10-CM | POA: Diagnosis not present

## 2020-03-02 DIAGNOSIS — R42 Dizziness and giddiness: Secondary | ICD-10-CM | POA: Diagnosis not present

## 2020-03-02 DIAGNOSIS — M150 Primary generalized (osteo)arthritis: Secondary | ICD-10-CM | POA: Diagnosis not present

## 2020-03-02 DIAGNOSIS — M461 Sacroiliitis, not elsewhere classified: Secondary | ICD-10-CM | POA: Diagnosis not present

## 2020-03-20 ENCOUNTER — Other Ambulatory Visit: Payer: Self-pay | Admitting: Family Medicine

## 2020-03-28 NOTE — Telephone Encounter (Signed)
Please sign encounter 

## 2020-04-19 ENCOUNTER — Other Ambulatory Visit: Payer: Self-pay | Admitting: Family Medicine

## 2020-04-19 NOTE — Telephone Encounter (Signed)
May have 5 refills on each

## 2020-04-29 LAB — BASIC METABOLIC PANEL
BUN/Creatinine Ratio: 13 (ref 12–28)
BUN: 13 mg/dL (ref 8–27)
CO2: 26 mmol/L (ref 20–29)
Calcium: 9.8 mg/dL (ref 8.7–10.3)
Chloride: 103 mmol/L (ref 96–106)
Creatinine, Ser: 1.04 mg/dL — ABNORMAL HIGH (ref 0.57–1.00)
GFR calc Af Amer: 59 mL/min/{1.73_m2} — ABNORMAL LOW (ref >59)
GFR calc non Af Amer: 51 mL/min/{1.73_m2} — ABNORMAL LOW (ref >59)
Glucose: 109 mg/dL — ABNORMAL HIGH (ref 65–99)
Potassium: 4.9 mmol/L (ref 3.5–5.2)
Sodium: 143 mmol/L (ref 134–144)

## 2020-04-29 LAB — MICROALBUMIN / CREATININE URINE RATIO
Creatinine, Urine: 48.2 mg/dL
Microalb/Creat Ratio: 7 mg/g creat (ref 0–29)
Microalbumin, Urine: 3.5 ug/mL

## 2020-05-04 ENCOUNTER — Encounter: Payer: Self-pay | Admitting: Family Medicine

## 2020-05-04 ENCOUNTER — Other Ambulatory Visit: Payer: Self-pay

## 2020-05-04 ENCOUNTER — Ambulatory Visit (INDEPENDENT_AMBULATORY_CARE_PROVIDER_SITE_OTHER): Payer: Medicare Other | Admitting: Family Medicine

## 2020-05-04 VITALS — BP 130/86 | Temp 97.6°F | Ht 65.0 in | Wt 158.6 lb

## 2020-05-04 DIAGNOSIS — Z79891 Long term (current) use of opiate analgesic: Secondary | ICD-10-CM | POA: Diagnosis not present

## 2020-05-04 DIAGNOSIS — I10 Essential (primary) hypertension: Secondary | ICD-10-CM

## 2020-05-04 DIAGNOSIS — G8929 Other chronic pain: Secondary | ICD-10-CM | POA: Diagnosis not present

## 2020-05-04 DIAGNOSIS — M1990 Unspecified osteoarthritis, unspecified site: Secondary | ICD-10-CM

## 2020-05-04 MED ORDER — HYDROCODONE-ACETAMINOPHEN 5-325 MG PO TABS: ORAL_TABLET | ORAL | 0 refills | Status: DC

## 2020-05-04 NOTE — Progress Notes (Signed)
Subjective:    Patient ID: Elizabeth Aguilar, female    DOB: [birth year], 79 y.o.   MRN: [id number]  HPI  This patient was seen today for chronic pain This patient has chronic arthritis she also has HLA positive sacroiliitis Her pain medicine does allow her to function better without it she states she would have a very difficult time She never received adequate benefit from anti-inflammatories.  She does not abuse the medicine and then denies feeling drugged with the medicine.  Drug registry is checked urine drug screen has been done on a regular basis The medication list was reviewed and updated.   -Compliance with medication: yes  - Number patient states they take daily: 4 a day unless driving  -when was the last dose patient took? today  The patient was advised the importance of maintaining medication and not using illegal substances with these.  Here for refills and follow up  The patient was educated that we can provide 3 monthly scripts for their medication, it is their responsibility to follow the instructions.  Side effects or complications from medications: none  Patient is aware that pain medications are meant to minimize the severity of the pain to allow their pain levels to improve to allow for better function. They are aware of that pain medications cannot totally remove their pain.  Due for UDT ( at least once per year) : today       Review of Systems  Constitutional: Negative for activity change, appetite change and fatigue.  HENT: Negative for congestion and rhinorrhea.   Respiratory: Negative for cough and shortness of breath.   Cardiovascular: Negative for chest pain and leg swelling.  Gastrointestinal: Negative for abdominal pain and diarrhea.  Endocrine: Negative for polydipsia and polyphagia.  Musculoskeletal: Positive for arthralgias and back pain.  Skin: Negative for color change.  Neurological: Negative for dizziness and weakness.   Psychiatric/Behavioral: Negative for behavioral problems and confusion.       Objective:   Physical Exam Vitals reviewed.  Constitutional:      General: She is not in acute distress. HENT:     Head: Normocephalic and atraumatic.  Eyes:     General:        Right eye: No discharge.        Left eye: No discharge.  Neck:     Trachea: No tracheal deviation.  Cardiovascular:     Rate and Rhythm: Normal rate and regular rhythm.     Heart sounds: Normal heart sounds. No murmur heard.   Pulmonary:     Effort: Pulmonary effort is normal. No respiratory distress.     Breath sounds: Normal breath sounds.  Lymphadenopathy:     Cervical: No cervical adenopathy.  Skin:    General: Skin is warm and dry.  Neurological:     Mental Status: She is alert.     Coordination: Coordination normal.  Psychiatric:        Behavior: Behavior normal.   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      Assessment & Plan:  Chronic low back pain along with neuropathy issues in her feet as well as preulcerative calluses would benefit from diabetic shoes will send prescription and copy of documentation to [facility]  Diabetes decent control dietary measures continue medication watch diet closely stay active  Chronic low back pain along with chronic arthritis benefits from her  pain medicine allows her to stay more functional she denies abusing it drug registry checked 3 scripts were given  The patient states that the rheumatologist told her that she needed an EpiPen when I read over the note it pointed more towards the probability of a Humira pen the patient is adamant that it was not Humira.  Therefore we will connect with rheumatology and try to figure this out.  Will send to rheumatology letter.  Follow-up 3 months

## 2020-05-11 ENCOUNTER — Encounter: Payer: Self-pay | Admitting: Family Medicine

## 2020-05-11 LAB — TOXASSURE SELECT 13 (MW), URINE

## 2020-05-18 ENCOUNTER — Other Ambulatory Visit: Payer: Self-pay | Admitting: Cardiovascular Disease

## 2020-05-18 ENCOUNTER — Other Ambulatory Visit: Payer: Self-pay | Admitting: Adult Health

## 2020-06-27 DIAGNOSIS — R300 Dysuria: Secondary | ICD-10-CM | POA: Diagnosis not present

## 2020-06-27 DIAGNOSIS — R8290 Unspecified abnormal findings in urine: Secondary | ICD-10-CM | POA: Diagnosis not present

## 2020-06-27 DIAGNOSIS — N3000 Acute cystitis without hematuria: Secondary | ICD-10-CM | POA: Diagnosis not present

## 2020-06-28 ENCOUNTER — Telehealth: Payer: Self-pay | Admitting: Family Medicine

## 2020-06-28 NOTE — Telephone Encounter (Signed)
Pt's daughter calling stating pt went to urgent care yesterday and BP was 190/91. She is wanting her to have a follow up first available is 8/31 is that ok or should pt be seen sooner.

## 2020-06-28 NOTE — Telephone Encounter (Signed)
Patient seen in Urgent care and treated for UTI-- patient checks bp at home and reports 168/84 this morning but that it goes up and down.

## 2020-06-28 NOTE — Telephone Encounter (Signed)
May have tomorrow morning at 930 with myself or in the afternoon with Santiago Glad

## 2020-06-29 ENCOUNTER — Other Ambulatory Visit: Payer: Self-pay

## 2020-06-29 ENCOUNTER — Ambulatory Visit (INDEPENDENT_AMBULATORY_CARE_PROVIDER_SITE_OTHER): Payer: Medicare Other | Admitting: Family Medicine

## 2020-06-29 ENCOUNTER — Telehealth: Payer: Self-pay | Admitting: Family Medicine

## 2020-06-29 ENCOUNTER — Encounter: Payer: Self-pay | Admitting: Family Medicine

## 2020-06-29 VITALS — BP 160/98 | HR 104 | Temp 97.2°F | Wt 158.8 lb

## 2020-06-29 DIAGNOSIS — I10 Essential (primary) hypertension: Secondary | ICD-10-CM

## 2020-06-29 MED ORDER — AMLODIPINE BESYLATE 2.5 MG PO TABS
2.5000 mg | ORAL_TABLET | Freq: Every day | ORAL | 1 refills | Status: DC
Start: 2020-06-29 — End: 2020-08-03

## 2020-06-29 NOTE — Telephone Encounter (Signed)
Did you mean to type a message not sure. Nothing came through on message

## 2020-06-29 NOTE — Telephone Encounter (Signed)
Discussed with pt. Pt verbalized understanding.  °

## 2020-06-29 NOTE — Patient Instructions (Addendum)
I am going to discuss your case with Dr. Nicki Reaper today.  When I rechecked your blood pressure today it was 136/84 Continue to monitor your blood pressure while sitting in a chair comfortably. The nurse or myself will call you later today with the plan of care for you.     Managing Your Hypertension Hypertension is commonly called high blood pressure. This is when the force of your blood pressing against the walls of your arteries is too strong. Arteries are blood vessels that carry blood from your heart throughout your body. Hypertension forces the heart to work harder to pump blood, and may cause the arteries to become narrow or stiff. Having untreated or uncontrolled hypertension can cause heart attack, stroke, kidney disease, and other problems. What are blood pressure readings? A blood pressure reading consists of a higher number over a lower number. Ideally, your blood pressure should be below 120/80. The first ("top") number is called the systolic pressure. It is a measure of the pressure in your arteries as your heart beats. The second ("bottom") number is called the diastolic pressure. It is a measure of the pressure in your arteries as the heart relaxes. What does my blood pressure reading mean? Blood pressure is classified into four stages. Based on your blood pressure reading, your health care provider may use the following stages to determine what type of treatment you need, if any. Systolic pressure and diastolic pressure are measured in a unit called mm Hg. Normal  Systolic pressure: below 542.  Diastolic pressure: below 80. Elevated  Systolic pressure: 706-237.  Diastolic pressure: below 80. Hypertension stage 1  Systolic pressure: 628-315.  Diastolic pressure: 17-61. Hypertension stage 2  Systolic pressure: 607 or above.  Diastolic pressure: 90 or above. What health risks are associated with hypertension? Managing your hypertension is an important responsibility.  Uncontrolled hypertension can lead to:  A heart attack.  A stroke.  A weakened blood vessel (aneurysm).  Heart failure.  Kidney damage.  Eye damage.  Metabolic syndrome.  Memory and concentration problems. What changes can I make to manage my hypertension? Hypertension can be managed by making lifestyle changes and possibly by taking medicines. Your health care provider will help you make a plan to bring your blood pressure within a normal range. Eating and drinking   Eat a diet that is high in fiber and potassium, and low in salt (sodium), added sugar, and fat. An example eating plan is called the DASH (Dietary Approaches to Stop Hypertension) diet. To eat this way: ? Eat plenty of fresh fruits and vegetables. Try to fill half of your plate at each meal with fruits and vegetables. ? Eat whole grains, such as whole wheat pasta, brown rice, or whole grain bread. Fill about one quarter of your plate with whole grains. ? Eat low-fat diary products. ? Avoid fatty cuts of meat, processed or cured meats, and poultry with skin. Fill about one quarter of your plate with lean proteins such as fish, chicken without skin, beans, eggs, and tofu. ? Avoid premade and processed foods. These tend to be higher in sodium, added sugar, and fat.  Reduce your daily sodium intake. Most people with hypertension should eat less than 1,500 mg of sodium a day.  Limit alcohol intake to no more than 1 drink a day for nonpregnant women and 2 drinks a day for men. One drink equals 12 oz of beer, 5 oz of wine, or 1 oz of hard liquor. Lifestyle  Work with  your health care provider to maintain a healthy body weight, or to lose weight. Ask what an ideal weight is for you.  Get at least 30 minutes of exercise that causes your heart to beat faster (aerobic exercise) most days of the week. Activities may include walking, swimming, or biking.  Include exercise to strengthen your muscles (resistance exercise), such  as weight lifting, as part of your weekly exercise routine. Try to do these types of exercises for 30 minutes at least 3 days a week.  Do not use any products that contain nicotine or tobacco, such as cigarettes and e-cigarettes. If you need help quitting, ask your health care provider.  Control any long-term (chronic) conditions you have, such as high cholesterol or diabetes. Monitoring  Monitor your blood pressure at home as told by your health care provider. Your personal target blood pressure may vary depending on your medical conditions, your age, and other factors.  Have your blood pressure checked regularly, as often as told by your health care provider. Working with your health care provider  Review all the medicines you take with your health care provider because there may be side effects or interactions.  Talk with your health care provider about your diet, exercise habits, and other lifestyle factors that may be contributing to hypertension.  Visit your health care provider regularly. Your health care provider can help you create and adjust your plan for managing hypertension. Will I need medicine to control my blood pressure? Your health care provider may prescribe medicine if lifestyle changes are not enough to get your blood pressure under control, and if:  Your systolic blood pressure is 130 or higher.  Your diastolic blood pressure is 80 or higher. Take medicines only as told by your health care provider. Follow the directions carefully. Blood pressure medicines must be taken as prescribed. The medicine does not work as well when you skip doses. Skipping doses also puts you at risk for problems. Contact a health care provider if:  You think you are having a reaction to medicines you have taken.  You have repeated (recurrent) headaches.  You feel dizzy.  You have swelling in your ankles.  You have trouble with your vision. Get help right away if:  You develop a  severe headache or confusion.  You have unusual weakness or numbness, or you feel faint.  You have severe pain in your chest or abdomen.  You vomit repeatedly.  You have trouble breathing. Summary  Hypertension is when the force of blood pumping through your arteries is too strong. If this condition is not controlled, it may put you at risk for serious complications.  Your personal target blood pressure may vary depending on your medical conditions, your age, and other factors. For most people, a normal blood pressure is less than 120/80.  Hypertension is managed by lifestyle changes, medicines, or both. Lifestyle changes include weight loss, eating a healthy, low-sodium diet, exercising more, and limiting alcohol. This information is not intended to replace advice given to you by your health care provider. Make sure you discuss any questions you have with your health care provider. Document Revised: 02/19/2019 Document Reviewed: 09/25/2016 Elsevier Patient Education  [place].

## 2020-06-29 NOTE — Progress Notes (Signed)
Patient ID: Elizabeth Aguilar, female    DOB: 1941/04/01, 79 y.o.   MRN: [id number]   Chief Complaint  Patient presents with  . Hypertension   Subjective:    HPI Patient comes in today with concerns of hypertension.  Patient was seen at urgent care yesterday for UTI -currently taking Macrobid and bp was 190/91.  Blood pressures since aren't quite as high.   Medical History Matalie has a past medical history of CAD (coronary artery disease), Chest pain (10/10/2014), Chronic combined systolic and diastolic congestive heart failure ([place]), Hyperlipidemia, Irritable bowel syndrome, Myocardial infarction ([place]), Pericarditis, and Type 2 diabetes mellitus ([place]).   Outpatient Encounter Medications as of 06/29/2020  Medication Sig  . ALPRAZolam (XANAX) 0.5 MG tablet TAKE ONE TABLET TWICE DAILY AS NEEDED  . aspirin EC 81 MG EC tablet Take 1 tablet (81 mg total) by mouth daily.  [name] atorvastatin (LIPITOR) 40 MG tablet TAKE 1 TABLET DAILY AT 6PM  . esomeprazole (NEXIUM) 40 MG capsule TAKE ONE CAPSULE EACH MORNING  . fluticasone (FLONASE) 50 MCG/ACT nasal spray USE 2 SPRAYS IN EACH NOSTRIL DAILY  . furosemide (LASIX) 20 MG tablet TAKE ONE (1) TABLET EACH DAY  . HYDROcodone-acetaminophen (NORCO/VICODIN) 5-325 MG tablet 1 qid prn  . HYDROcodone-acetaminophen (NORCO/VICODIN) 5-325 MG tablet Take one tablet qid prn pain  . HYDROcodone-acetaminophen (NORCO/VICODIN) 5-325 MG tablet Take one tablet qid prn pain  . ketoconazole (NIZORAL) 2 % cream Apply 1 application topically 2 (two) times daily.  [name] loratadine (CLARITIN) 10 MG tablet TAKE ONE (1) TABLET EACH DAY  . Magnesium Oxide 400 MG CAPS Take 1 capsule (400 mg total) by mouth daily.  . metFORMIN (GLUCOPHAGE) 500 MG tablet TAKE ONE TABLET BY MOUTH TWICE DAILY  . metoprolol succinate (TOPROL-XL) 50 MG 24 hr tablet TAKE 1 TABLET DAILY WITH FOOD  . Multiple Vitamin (MULTIVITAMIN WITH MINERALS) TABS tablet Take 1 tablet by mouth daily.  . nitrofurantoin,  macrocrystal-monohydrate, (MACROBID) 100 MG capsule Take 100 mg by mouth 2 (two) times daily.  . nitroGLYCERIN (NITROSTAT) 0.4 MG SL tablet Place 1 tablet (0.4 mg total) under the tongue every 5 (five) minutes x 3 doses as needed for chest pain.  [name] ondansetron (ZOFRAN) 8 MG tablet TAKE ONE TABLET EVERY 8 HOURS AS NEEDED   No facility-administered encounter medications on file as of 06/29/2020.     Review of Systems  Constitutional: Negative.   HENT: Negative.   Cardiovascular: Negative.        Reports ankle swelling- not swollen today in office.  Gastrointestinal: Negative.   Genitourinary:       Diagnosed with [facility] 2 [facility] ago [facility]  Musculoskeletal: Positive for arthralgias, gait problem and joint swelling.  Skin: Negative.   Allergic/Immunologic: Negative.   Neurological: Positive for headaches.       Headache x 1- took Tylenol and resolved. No vision changes or fever.  Hematological: Negative.   Psychiatric/Behavioral: Negative.      Vitals BP (!) 160/98   Pulse (!) 104   Temp (!) 97.2 F (36.2 C)   Wt 158 lb 12.8 oz (72 kg)   SpO2 96%   BMI 26.43 kg/m   Objective:   Physical Exam Vitals and nursing note reviewed.  Constitutional:      Appearance: Normal appearance.  Cardiovascular:     Rate and Rhythm: Normal rate and regular rhythm.     Pulses: Normal pulses.     Heart sounds: Normal heart sounds.  Pulmonary:  Effort: Pulmonary effort is normal.     Breath sounds: Normal breath sounds.  Skin:    General: Skin is warm and dry.  Neurological:     Mental Status: She is alert and oriented to person, place, and time.  Psychiatric:        Mood and Affect: Mood normal.        Behavior: Behavior normal.      Assessment and Plan   1. Elevated blood pressure reading with diagnosis of hypertension   Ravina presents today with concerns of elevated blood pressure when she presented to UC for UTI 2 days ago. She has been taking her blood pressure at home:  150/75-91; 154/84-91; 159/77-93.  Discussed sodium intake with patient, she reports that she does most of her cooking at home and does not add salt to her food.  I rechecked blood pressure: 136/84.  Medication reviewed with her: she is taking half of her lasix dose due to excessive trips to the bathroom. Has hx of heart failure and MI in past. Last EF 55-60% July 2020 per echo.   She will continue to monitor her blood pressure at home and bring those readings and her blood pressure cuff in the office when she follows up in September with Dr. Wolfgang Phoenix. After discussion with Dr. Wolfgang Phoenix she will be sent amlodipine 2.5 mg by mouth daily for her blood pressure.   Instructions have been given that this could cause puffiness in her ankles and it is not an unexpected finding.   Patient wanted Dr. Wolfgang Phoenix to order prednisone today, he does not feel this is appropriate at this time for her knee pain. The message nurse phoned patient today and she verbalized understanding of these instructions. Patient already has a follow-up appointment with Dr. Wolfgang Phoenix in September, and will return sooner if anything changes.  Pecolia Ades, NP

## 2020-06-29 NOTE — Telephone Encounter (Signed)
Nurses,  I saw Elizabeth Aguilar today and she is aware that I was going to consult with Dr. Nicki Reaper at lunch.  1) He agrees with me to start Amlodipine 2.5 mg (low dose) daily for better blood pressure control. 2) It is possible for this medication to cause "puffiness" in the lower extremities (this is not an unexpected thing, however, everyone doesn't get this). 3) On her next visit: please bring all medications in their bottles and her blood pressure cuff from home so we can see how accurate it is with what we are getting in the office 4) Dr. Nicki Reaper does not think it is appropriate to prescribe prednisone at this time for her knees. 5) Continue to keep blood pressure log at home.  She already has a follow-up appointment in September (per patient).  Thanks, Santiago Glad

## 2020-07-18 ENCOUNTER — Other Ambulatory Visit: Payer: Self-pay | Admitting: Family Medicine

## 2020-08-01 ENCOUNTER — Ambulatory Visit (INDEPENDENT_AMBULATORY_CARE_PROVIDER_SITE_OTHER): Payer: Medicare Other | Admitting: Family Medicine

## 2020-08-01 ENCOUNTER — Other Ambulatory Visit: Payer: Self-pay

## 2020-08-01 ENCOUNTER — Encounter: Payer: Self-pay | Admitting: Family Medicine

## 2020-08-01 VITALS — BP 132/84 | Temp 97.5°F | Ht 65.0 in | Wt 158.6 lb

## 2020-08-01 DIAGNOSIS — Z23 Encounter for immunization: Secondary | ICD-10-CM | POA: Diagnosis not present

## 2020-08-01 DIAGNOSIS — E1169 Type 2 diabetes mellitus with other specified complication: Secondary | ICD-10-CM

## 2020-08-01 DIAGNOSIS — I10 Essential (primary) hypertension: Secondary | ICD-10-CM

## 2020-08-01 DIAGNOSIS — G8929 Other chronic pain: Secondary | ICD-10-CM | POA: Diagnosis not present

## 2020-08-01 DIAGNOSIS — G4700 Insomnia, unspecified: Secondary | ICD-10-CM

## 2020-08-01 DIAGNOSIS — F411 Generalized anxiety disorder: Secondary | ICD-10-CM | POA: Diagnosis not present

## 2020-08-01 DIAGNOSIS — E785 Hyperlipidemia, unspecified: Secondary | ICD-10-CM

## 2020-08-01 DIAGNOSIS — N182 Chronic kidney disease, stage 2 (mild): Secondary | ICD-10-CM | POA: Diagnosis not present

## 2020-08-01 DIAGNOSIS — E1122 Type 2 diabetes mellitus with diabetic chronic kidney disease: Secondary | ICD-10-CM | POA: Diagnosis not present

## 2020-08-01 MED ORDER — HYDROCODONE-ACETAMINOPHEN 5-325 MG PO TABS: ORAL_TABLET | ORAL | 0 refills | Status: DC

## 2020-08-01 MED ORDER — HYDROCODONE-ACETAMINOPHEN 5-325 MG PO TABS
ORAL_TABLET | ORAL | 0 refills | Status: DC
Start: 2020-08-01 — End: 2020-11-14

## 2020-08-01 MED ORDER — ALPRAZOLAM 0.5 MG PO TABS: ORAL_TABLET | ORAL | 3 refills | Status: DC

## 2020-08-01 NOTE — Progress Notes (Signed)
Subjective:    Patient ID: Elizabeth Aguilar, female    DOB: Aug 12, 1941, 79 y.o.   MRN: [id number]  HPI  This patient was seen today for chronic pain  The medication list was reviewed and updated.   -Compliance with medication: YES  - Number patient states they take daily: 4 a day but doesn't take it when she has to drive  -when was the last dose patient took? Yesterday because she had to drive today  The patient was advised the importance of maintaining medication and not using illegal substances with these.  Here for refills and follow up  The patient was educated that we can provide 3 monthly scripts for their medication, it is their responsibility to follow the instructions.  Side effects or complications from medications: none  Patient is aware that pain medications are meant to minimize the severity of the pain to allow their pain levels to improve to allow for better function. They are aware of that pain medications cannot totally remove their pain.  Due for UDT ( at least once per year) : completed 04/2020   Fall Risk  08/01/2020 01/05/2020 10/25/2019 01/12/2019 12/31/2017  Falls in the past year? 1 1 0 0 No  Comment - - - - -  Number falls in past yr: 0 1 - 0 -  Comment - - - - -  Injury with Fall? 0 1 - 0 -  Risk for fall due to : History of fall(s) Impaired balance/gait - - -  Follow up Falls evaluation completed;Education provided Education provided Falls evaluation completed Falls evaluation completed -         Review of Systems  Constitutional: Negative for activity change, appetite change and fatigue.  HENT: Negative for congestion and rhinorrhea.   Respiratory: Negative for cough and shortness of breath.   Cardiovascular: Negative for chest pain and leg swelling.  Gastrointestinal: Negative for abdominal pain and diarrhea.  Endocrine: Negative for polydipsia and polyphagia.  Musculoskeletal: Positive for back pain.  Skin: Negative for color change.   Neurological: Negative for dizziness and weakness.  Psychiatric/Behavioral: Negative for behavioral problems and confusion.       Objective:   Physical Exam Vitals reviewed.  Constitutional:      General: She is not in acute distress. HENT:     Head: Normocephalic and atraumatic.  Eyes:     General:        Right eye: No discharge.        Left eye: No discharge.  Neck:     Trachea: No tracheal deviation.  Cardiovascular:     Rate and Rhythm: Normal rate and regular rhythm.     Heart sounds: Normal heart sounds. No murmur heard.   Pulmonary:     Effort: Pulmonary effort is normal. No respiratory distress.     Breath sounds: Normal breath sounds.  Lymphadenopathy:     Cervical: No cervical adenopathy.  Skin:    General: Skin is warm and dry.  Neurological:     Mental Status: She is alert.     Coordination: Coordination normal.  Psychiatric:        Behavior: Behavior normal.           Assessment & Plan:  1. Essential hypertension Blood pressure good control continue current measures  2. Type 2 diabetes mellitus with stage 2 chronic kidney disease, without long-term current use of insulin ([place]) Has diabetes.  Very important to keep diet under control.  Continue current medications.  Monitor sugars periodically.  A1c previously okay recheck it again by December  3. Encounter for chronic pain management The patient was seen in followup for chronic pain. A review over at their current pain status was discussed. Drug registry was checked. Prescriptions were given.  Regular follow-up recommended. Discussion was held regarding the importance of compliance with medication as well as pain medication contract.  Patient was informed that medication may cause drowsiness and should not be combined  with other medications/alcohol or street drugs. If the patient feels medication is causing altered alertness then do not drive or operate dangerous equipment.  Drug registry  checked 3 prescription sent in  4. Hyperlipidemia associated with type 2 diabetes mellitus (HCC) Check cholesterol profile by the end of this year watch diet continue medication  5. GAD (generalized anxiety disorder) For patient safety I recommend that she reduce her Xanax to half a tablet during the day when she states that she is use live one evening time for years and would like to continue this she states she does not take pain medicine at bedtime  6. Insomnia, unspecified type Continue current medication as per above in future visits we will work on reducing this down to 1/2 tablet  7. Need for vaccination Flu shot - Flu Vaccine QUAD High Dose(Fluad)

## 2020-08-02 NOTE — Addendum Note (Signed)
Addended by: Dairl Ponder on: 08/02/2020 09:20 AM   Modules accepted: Orders

## 2020-08-03 ENCOUNTER — Other Ambulatory Visit: Payer: Self-pay | Admitting: Family Medicine

## 2020-08-16 ENCOUNTER — Other Ambulatory Visit: Payer: Self-pay | Admitting: Family Medicine

## 2020-08-31 DIAGNOSIS — Z6828 Body mass index (BMI) 28.0-28.9, adult: Secondary | ICD-10-CM | POA: Diagnosis not present

## 2020-08-31 DIAGNOSIS — E663 Overweight: Secondary | ICD-10-CM | POA: Diagnosis not present

## 2020-08-31 DIAGNOSIS — M545 Low back pain, unspecified: Secondary | ICD-10-CM | POA: Diagnosis not present

## 2020-08-31 DIAGNOSIS — M150 Primary generalized (osteo)arthritis: Secondary | ICD-10-CM | POA: Diagnosis not present

## 2020-08-31 DIAGNOSIS — M2550 Pain in unspecified joint: Secondary | ICD-10-CM | POA: Diagnosis not present

## 2020-08-31 DIAGNOSIS — M461 Sacroiliitis, not elsewhere classified: Secondary | ICD-10-CM | POA: Diagnosis not present

## 2020-08-31 DIAGNOSIS — Z1589 Genetic susceptibility to other disease: Secondary | ICD-10-CM | POA: Diagnosis not present

## 2020-09-04 DIAGNOSIS — Z961 Presence of intraocular lens: Secondary | ICD-10-CM | POA: Diagnosis not present

## 2020-09-04 DIAGNOSIS — H35342 Macular cyst, hole, or pseudohole, left eye: Secondary | ICD-10-CM | POA: Diagnosis not present

## 2020-09-04 DIAGNOSIS — H524 Presbyopia: Secondary | ICD-10-CM | POA: Diagnosis not present

## 2020-09-04 DIAGNOSIS — E119 Type 2 diabetes mellitus without complications: Secondary | ICD-10-CM | POA: Diagnosis not present

## 2020-09-04 DIAGNOSIS — H5203 Hypermetropia, bilateral: Secondary | ICD-10-CM | POA: Diagnosis not present

## 2020-09-04 DIAGNOSIS — H52223 Regular astigmatism, bilateral: Secondary | ICD-10-CM | POA: Diagnosis not present

## 2020-09-08 DIAGNOSIS — Z23 Encounter for immunization: Secondary | ICD-10-CM | POA: Diagnosis not present

## 2020-09-15 ENCOUNTER — Other Ambulatory Visit: Payer: Self-pay | Admitting: Family Medicine

## 2020-10-16 ENCOUNTER — Other Ambulatory Visit: Payer: Self-pay | Admitting: Family Medicine

## 2020-11-06 DIAGNOSIS — E785 Hyperlipidemia, unspecified: Secondary | ICD-10-CM | POA: Diagnosis not present

## 2020-11-06 DIAGNOSIS — N182 Chronic kidney disease, stage 2 (mild): Secondary | ICD-10-CM | POA: Diagnosis not present

## 2020-11-06 DIAGNOSIS — E1169 Type 2 diabetes mellitus with other specified complication: Secondary | ICD-10-CM | POA: Diagnosis not present

## 2020-11-06 DIAGNOSIS — E1122 Type 2 diabetes mellitus with diabetic chronic kidney disease: Secondary | ICD-10-CM | POA: Diagnosis not present

## 2020-11-07 LAB — BASIC METABOLIC PANEL
BUN/Creatinine Ratio: 16 (ref 12–28)
BUN: 16 mg/dL (ref 8–27)
CO2: 26 mmol/L (ref 20–29)
Calcium: 9.6 mg/dL (ref 8.7–10.3)
Chloride: 104 mmol/L (ref 96–106)
Creatinine, Ser: 0.97 mg/dL (ref 0.57–1.00)
GFR calc Af Amer: 64 mL/min/{1.73_m2} (ref >59)
GFR calc non Af Amer: 55 mL/min/{1.73_m2} — ABNORMAL LOW (ref >59)
Glucose: 125 mg/dL — ABNORMAL HIGH (ref 65–99)
Potassium: 4.7 mmol/L (ref 3.5–5.2)
Sodium: 145 mmol/L — ABNORMAL HIGH (ref 134–144)

## 2020-11-07 LAB — LIPID PANEL
Chol/HDL Ratio: 3.3 ratio (ref 0.0–4.4)
Cholesterol, Total: 193 mg/dL (ref 100–199)
HDL: 58 mg/dL (ref >39)
LDL Chol Calc (NIH): 115 mg/dL — ABNORMAL HIGH (ref 0–99)
Triglycerides: 113 mg/dL (ref 0–149)
VLDL Cholesterol Cal: 20 mg/dL (ref 5–40)

## 2020-11-07 LAB — HEMOGLOBIN A1C
Est. average glucose Bld gHb Est-mCnc: 157 mg/dL
Hgb A1c MFr Bld: 7.1 % — ABNORMAL HIGH (ref 4.8–5.6)

## 2020-11-14 ENCOUNTER — Ambulatory Visit (INDEPENDENT_AMBULATORY_CARE_PROVIDER_SITE_OTHER): Payer: Medicare Other | Admitting: Family Medicine

## 2020-11-14 ENCOUNTER — Encounter: Payer: Self-pay | Admitting: Family Medicine

## 2020-11-14 ENCOUNTER — Other Ambulatory Visit: Payer: Self-pay | Admitting: Family Medicine

## 2020-11-14 ENCOUNTER — Other Ambulatory Visit: Payer: Self-pay

## 2020-11-14 VITALS — BP 128/76 | HR 103 | Temp 95.1°F | Wt 163.8 lb

## 2020-11-14 DIAGNOSIS — E1122 Type 2 diabetes mellitus with diabetic chronic kidney disease: Secondary | ICD-10-CM | POA: Diagnosis not present

## 2020-11-14 DIAGNOSIS — E1169 Type 2 diabetes mellitus with other specified complication: Secondary | ICD-10-CM

## 2020-11-14 DIAGNOSIS — M79673 Pain in unspecified foot: Secondary | ICD-10-CM

## 2020-11-14 DIAGNOSIS — Z79899 Other long term (current) drug therapy: Secondary | ICD-10-CM

## 2020-11-14 DIAGNOSIS — N182 Chronic kidney disease, stage 2 (mild): Secondary | ICD-10-CM

## 2020-11-14 DIAGNOSIS — I10 Essential (primary) hypertension: Secondary | ICD-10-CM

## 2020-11-14 DIAGNOSIS — E785 Hyperlipidemia, unspecified: Secondary | ICD-10-CM

## 2020-11-14 DIAGNOSIS — G8929 Other chronic pain: Secondary | ICD-10-CM

## 2020-11-14 MED ORDER — HYDROCODONE-ACETAMINOPHEN 5-325 MG PO TABS: ORAL_TABLET | ORAL | 0 refills | Status: DC

## 2020-11-14 MED ORDER — HYDROCODONE-ACETAMINOPHEN 5-325 MG PO TABS
ORAL_TABLET | ORAL | 0 refills | Status: DC
Start: 2020-11-14 — End: 2021-02-12

## 2020-11-14 NOTE — Patient Instructions (Addendum)
Results for orders placed or performed in visit on 08/01/20  Lipid panel  Result Value Ref Range   Cholesterol, Total 193 100 - 199 mg/dL   Triglycerides 696 0 - 149 mg/dL   HDL 58 >29 mg/dL   VLDL Cholesterol Cal 20 5 - 40 mg/dL   LDL Chol Calc (NIH) 528 (H) 0 - 99 mg/dL   Chol/HDL Ratio 3.3 0.0 - 4.4 ratio  Basic metabolic panel  Result Value Ref Range   Glucose 125 (H) 65 - 99 mg/dL   BUN 16 8 - 27 mg/dL   Creatinine, Ser 4.13 0.57 - 1.00 mg/dL   GFR calc non Af Amer 55 (L) >59 mL/min/1.73   GFR calc Af Amer 64 >59 mL/min/1.73   BUN/Creatinine Ratio 16 12 - 28   Sodium 145 (H) 134 - 144 mmol/L   Potassium 4.7 3.5 - 5.2 mmol/L   Chloride 104 96 - 106 mmol/L   CO2 26 20 - 29 mmol/L   Calcium 9.6 8.7 - 10.3 mg/dL  Hemoglobin K4M  Result Value Ref Range   Hgb A1c MFr Bld 7.1 (H) 4.8 - 5.6 %   Est. average glucose Bld gHb Est-mCnc 157 mg/dL   We will be setting you up an appointment with podiatry in [facility] Dr. Alona Bene Please do your lab work before your follow-up visit in 3 months Please take your Metformin twice daily If you feel you are having any trouble please let me know Refills of your medications will be sent in later this afternoon If you have not done the booster of your Covid vaccine please do so ASAP

## 2020-11-14 NOTE — Progress Notes (Signed)
Subjective:    Patient ID: Elizabeth Aguilar, female    DOB: [birth year], 80 y.o.   MRN: [id number]  HPI This patient was seen today for chronic pain  The medication list was reviewed and updated.   -Compliance with medication: Hydrocodone 5-325 mg  - Number patient states they take daily: 1-3  -when was the last dose patient took? Last night   The patient was advised the importance of maintaining medication and not using illegal substances with these.  Here for refills and follow up  The patient was educated that we can provide 3 monthly scripts for their medication, it is their responsibility to follow the instructions.  Side effects or complications from medications: none  Patient is aware that pain medications are meant to minimize the severity of the pain to allow their pain levels to improve to allow for better function. They are aware of that pain medications cannot totally remove their pain.  Due for UDT ( at least once per year) : completed in June 2021  Scale of 1 to 10 ( 1 is least 10 is most) Your pain level without the medicine: 9 Your pain level with medication 3  Scale 1 to 10 ( 1-helps very little, 10 helps very well) How well does your pain medication reduce your pain so you can function better through out the day? 10    Pain of foot, unspecified laterality - Plan: Ambulatory referral to Podiatry  Essential hypertension - Plan: Hemoglobin A1c, Basic Metabolic Panel (BMET), Hepatic function panel  Type 2 diabetes mellitus with stage 2 chronic kidney disease, without long-term current use of insulin (HCC) - Plan: Hemoglobin A1c, Basic Metabolic Panel (BMET), Hepatic function panel  Hyperlipidemia associated with type 2 diabetes mellitus (HCC) - Plan: Hemoglobin A1c, Basic Metabolic Panel (BMET), Hepatic function panel  High risk medication use - Plan: Hemoglobin A1c, Basic Metabolic Panel (BMET), Hepatic function panel  Patient does take her cholesterol  medicine states she did not tolerate the higher dose labs were reviewed with her Diabetes fair control but she is only doing 1 Metformin a day she does relate that she is willing to go up and around your   Review of Systems  Constitutional: Negative for activity change and appetite change.  HENT: Negative for congestion and rhinorrhea.   Respiratory: Negative for cough and shortness of breath.   Cardiovascular: Negative for chest pain and leg swelling.  Gastrointestinal: Negative for abdominal pain, nausea and vomiting.  Musculoskeletal: Positive for back pain. Negative for arthralgias.  Skin: Negative for color change.  Neurological: Negative for dizziness and weakness.  Psychiatric/Behavioral: Negative for agitation and confusion.       Objective:   Physical Exam Vitals reviewed.  Constitutional:      General: She is not in acute distress.    Appearance: She is well-nourished.  HENT:     Head: Normocephalic.  Cardiovascular:     Rate and Rhythm: Normal rate and regular rhythm.     Heart sounds: Normal heart sounds. No murmur heard.   Pulmonary:     Effort: Pulmonary effort is normal.     Breath sounds: Normal breath sounds.  Musculoskeletal:        General: No edema.  Lymphadenopathy:     Cervical: No cervical adenopathy.  Neurological:     Mental Status: She is alert.  Psychiatric:        Behavior: Behavior normal.           Assessment &  Plan:  1. Pain of foot, unspecified laterality Referral to podiatry.  Burning in the foot more than likely related into diabetes but patient does have very thick toenails that may need tending to - Ambulatory referral to Podiatry  2. Essential hypertension Blood pressure good control continue current measures - Hemoglobin 123456 - Basic Metabolic Panel (BMET) - Hepatic function panel  3. Type 2 diabetes mellitus with stage 2 chronic kidney disease, without long-term current use of insulin (HCC) Diabetes reasonable control  patient will go up on the Metformin we will do a follow-up 123456 metabolic 7 in 3 months to make sure she is getting along with the increased dose of the Metformin - Hemoglobin 123456 - Basic Metabolic Panel (BMET) - Hepatic function panel  4. Hyperlipidemia associated with type 2 diabetes mellitus ([place]) Patient does not tolerate higher dose of statin continue current measures - Hemoglobin 123456 - Basic Metabolic Panel (BMET) - Hepatic function panel  5. High risk medication use Patient is concerned about the possibility underlying liver disease but her liver enzymes look good and ultrasound look good I believe that the patient needs to follow a healthy diet continue on her medications  She has a paper at home that states we gave her that says she has liver disease and she will bring that to [place] for our review - Hemoglobin 123456 - Basic Metabolic Panel (BMET) - Hepatic function panel  6. Encounter for chronic pain management The patient was seen in followup for chronic pain. A review over at their current pain status was discussed. Drug registry was checked. Prescriptions were given.  Regular follow-up recommended. Discussion was held regarding the importance of compliance with medication as well as pain medication contract.  Patient was informed that medication may cause drowsiness and should not be combined  with other medications/alcohol or street drugs. If the patient feels medication is causing altered alertness then do not drive or operate dangerous equipment.  Patient does take her pain medicines on a regular basis denies any problems with it 3 prescriptions were sent in  History of depression with anxiety overall doing well she has been on Xanax for years we have tried to taper it down its been very difficult to do so on the current dose she is doing she denies any problems with it we will continue as is  We will see the patient back in 3 months

## 2020-12-14 ENCOUNTER — Other Ambulatory Visit: Payer: Self-pay | Admitting: Family Medicine

## 2020-12-14 NOTE — Telephone Encounter (Signed)
11/14/20 was last check up

## 2021-01-02 ENCOUNTER — Telehealth: Payer: Self-pay | Admitting: Family Medicine

## 2021-01-02 NOTE — Telephone Encounter (Signed)
Patient states she had 2 little spots on her arm that itched like crazy- came up 3 days ago - the itching not as bad today

## 2021-01-02 NOTE — Telephone Encounter (Signed)
Patient has rash on arm and wanting some type of cream called into [facility]

## 2021-01-02 NOTE — Telephone Encounter (Signed)
Recommend triamcinolone cream, 15 g, apply thin amount twice daily for the next 10 days if ongoing troubles follow-up

## 2021-01-03 MED ORDER — TRIAMCINOLONE ACETONIDE 0.1 % EX CREA: TOPICAL_CREAM | CUTANEOUS | 0 refills | Status: DC

## 2021-01-03 NOTE — Telephone Encounter (Signed)
Prescription sent electronically to pharmacy. Patient notified. 

## 2021-01-11 ENCOUNTER — Other Ambulatory Visit: Payer: Self-pay | Admitting: Family Medicine

## 2021-01-23 DIAGNOSIS — E1169 Type 2 diabetes mellitus with other specified complication: Secondary | ICD-10-CM | POA: Diagnosis not present

## 2021-01-23 DIAGNOSIS — E1122 Type 2 diabetes mellitus with diabetic chronic kidney disease: Secondary | ICD-10-CM | POA: Diagnosis not present

## 2021-01-23 DIAGNOSIS — N182 Chronic kidney disease, stage 2 (mild): Secondary | ICD-10-CM | POA: Diagnosis not present

## 2021-01-23 DIAGNOSIS — Z79899 Other long term (current) drug therapy: Secondary | ICD-10-CM | POA: Diagnosis not present

## 2021-01-23 DIAGNOSIS — I10 Essential (primary) hypertension: Secondary | ICD-10-CM | POA: Diagnosis not present

## 2021-01-23 DIAGNOSIS — E785 Hyperlipidemia, unspecified: Secondary | ICD-10-CM | POA: Diagnosis not present

## 2021-01-24 LAB — BASIC METABOLIC PANEL
BUN/Creatinine Ratio: 15 (ref 12–28)
BUN: 16 mg/dL (ref 8–27)
CO2: 24 mmol/L (ref 20–29)
Calcium: 9.9 mg/dL (ref 8.7–10.3)
Chloride: 102 mmol/L (ref 96–106)
Creatinine, Ser: 1.05 mg/dL — ABNORMAL HIGH (ref 0.57–1.00)
Glucose: 127 mg/dL — ABNORMAL HIGH (ref 65–99)
Potassium: 4.6 mmol/L (ref 3.5–5.2)
Sodium: 142 mmol/L (ref 134–144)
eGFR: 54 mL/min/{1.73_m2} — ABNORMAL LOW (ref >59)

## 2021-01-24 LAB — HEMOGLOBIN A1C
Est. average glucose Bld gHb Est-mCnc: 157 mg/dL
Hgb A1c MFr Bld: 7.1 % — ABNORMAL HIGH (ref 4.8–5.6)

## 2021-01-24 LAB — HEPATIC FUNCTION PANEL
ALT: 14 IU/L (ref 0–32)
AST: 14 IU/L (ref 0–40)
Albumin: 4.2 g/dL (ref 3.7–4.7)
Alkaline Phosphatase: 217 IU/L — ABNORMAL HIGH (ref 44–121)
Bilirubin Total: 0.4 mg/dL (ref 0.0–1.2)
Bilirubin, Direct: <0.1 mg/dL (ref 0.00–0.40)
Total Protein: 6.9 g/dL (ref 6.0–8.5)

## 2021-01-25 ENCOUNTER — Encounter: Payer: Self-pay | Admitting: Family Medicine

## 2021-01-29 ENCOUNTER — Other Ambulatory Visit: Payer: Self-pay | Admitting: Family Medicine

## 2021-02-12 ENCOUNTER — Other Ambulatory Visit: Payer: Self-pay | Admitting: Cardiovascular Disease

## 2021-02-12 ENCOUNTER — Other Ambulatory Visit: Payer: Self-pay | Admitting: Family Medicine

## 2021-02-12 ENCOUNTER — Ambulatory Visit (INDEPENDENT_AMBULATORY_CARE_PROVIDER_SITE_OTHER): Payer: Medicare Other | Admitting: Family Medicine

## 2021-02-12 ENCOUNTER — Other Ambulatory Visit: Payer: Self-pay

## 2021-02-12 ENCOUNTER — Encounter: Payer: Self-pay | Admitting: Family Medicine

## 2021-02-12 DIAGNOSIS — I10 Essential (primary) hypertension: Secondary | ICD-10-CM

## 2021-02-12 DIAGNOSIS — E1122 Type 2 diabetes mellitus with diabetic chronic kidney disease: Secondary | ICD-10-CM | POA: Diagnosis not present

## 2021-02-12 DIAGNOSIS — R748 Abnormal levels of other serum enzymes: Secondary | ICD-10-CM

## 2021-02-12 DIAGNOSIS — G8929 Other chronic pain: Secondary | ICD-10-CM

## 2021-02-12 DIAGNOSIS — E1142 Type 2 diabetes mellitus with diabetic polyneuropathy: Secondary | ICD-10-CM

## 2021-02-12 DIAGNOSIS — G4700 Insomnia, unspecified: Secondary | ICD-10-CM

## 2021-02-12 DIAGNOSIS — M549 Dorsalgia, unspecified: Secondary | ICD-10-CM | POA: Diagnosis not present

## 2021-02-12 DIAGNOSIS — N182 Chronic kidney disease, stage 2 (mild): Secondary | ICD-10-CM | POA: Diagnosis not present

## 2021-02-12 MED ORDER — FLUTICASONE PROPIONATE 50 MCG/ACT NA SUSP: 2.0000 | Freq: Every day | NASAL | 12 refills | Status: DC

## 2021-02-12 MED ORDER — HYDROCODONE-ACETAMINOPHEN 5-325 MG PO TABS: ORAL_TABLET | ORAL | 0 refills | Status: DC

## 2021-02-12 MED ORDER — AMLODIPINE BESYLATE 2.5 MG PO TABS
ORAL_TABLET | ORAL | 5 refills | Status: DC
Start: 2021-02-12 — End: 2021-10-30

## 2021-02-12 MED ORDER — METOPROLOL SUCCINATE ER 50 MG PO TB24: 50.0000 mg | ORAL_TABLET | Freq: Every day | ORAL | 1 refills | Status: DC

## 2021-02-12 MED ORDER — DULOXETINE HCL 20 MG PO CPEP: ORAL_CAPSULE | ORAL | 5 refills | Status: DC

## 2021-02-12 NOTE — Progress Notes (Signed)
Subjective:    Patient ID: Elizabeth Aguilar, female    DOB: 06-13-1941, 80 y.o.   MRN: [id number]  Diabetes She presents for her follow-up diabetic visit. She has type 2 diabetes mellitus. There are no hypoglycemic associated symptoms. Pertinent negatives for hypoglycemia include no confusion or dizziness. There are no diabetic associated symptoms. Pertinent negatives for diabetes include no chest pain and no weakness. There are no hypoglycemic complications. There are no diabetic complications. Diabetic current diet: pt did stop drinking soda. She does not see a podiatrist (Pt states Dr.Joyce in [place] would not take Medicaid/Medicare).Eye exam is current.  pt states feet burn at night when she takes her shoes off.   This patient was seen today for chronic pain  The medication list was reviewed and updated.  Location of Pain for which the patient has been treated with regarding narcotics: back/hip/knee  Onset of this pain: a long time ago    -Compliance with medication: Hydrocodone 5-325 mg   - Number patient states they take daily: 2-4  -when was the last dose patient took? Last night   The patient was advised the importance of maintaining medication and not using illegal substances with these.  Here for refills and follow up  The patient was educated that we can provide 3 monthly scripts for their medication, it is their responsibility to follow the instructions.  Side effects or complications from medications: none  Patient is aware that pain medications are meant to minimize the severity of the pain to allow their pain levels to improve to allow for better function. They are aware of that pain medications cannot totally remove their pain.  Due for UDT ( at least once per year) : completed July 2021  Scale of 1 to 10 ( 1 is least 10 is most) Your pain level without the medicine: 9 Your pain level with medication: 5  Scale 1 to 10 ( 1-helps very little, 10 helps very well) How  well does your pain medication reduce your pain so you can function better through out the day? 10  Quality of the pain: aching pain   Persistence of the pain: constant   Modifying factors: Patient states medication helps also sitting down when it bothers her helps  Elevated alkaline phosphatase level - Plan: Gamma GT  Essential hypertension  Type 2 diabetes mellitus with stage 2 chronic kidney disease, without long-term current use of insulin (HCC)  Insomnia, unspecified type  Other chronic back pain  Diabetic peripheral neuropathy (HCC)    She does state her sugars been doing okay denies any low spells Takes her medicine for blood pressure tries to stay active Does have difficult time sleeping typically takes Xanax for that and feels very anxious during the day we have encouraged her to try to limit the use during the day She also has significant peripheral neuropathy due to diabetes worse in the right foot than the left foot     Review of Systems  Constitutional: Negative for activity change and appetite change.  HENT: Negative for congestion and rhinorrhea.   Respiratory: Negative for cough and shortness of breath.   Cardiovascular: Negative for chest pain and leg swelling.  Gastrointestinal: Negative for abdominal pain, nausea and vomiting.  Skin: Negative for color change.  Neurological: Negative for dizziness and weakness.  Psychiatric/Behavioral: Negative for agitation and confusion.       Objective:   Physical Exam Vitals reviewed.  Constitutional:      General: She is  not in acute distress. HENT:     Head: Normocephalic.  Cardiovascular:     Rate and Rhythm: Normal rate and regular rhythm.     Heart sounds: Normal heart sounds. No murmur heard.   Pulmonary:     Effort: Pulmonary effort is normal.     Breath sounds: Normal breath sounds.  Lymphadenopathy:     Cervical: No cervical adenopathy.  Neurological:     Mental Status: She is alert.   Psychiatric:        Behavior: Behavior normal.           Assessment & Plan:  1. Elevated alkaline phosphatase level GGT is ordered because of elevated alkaline phosphatase.  Based upon this may or may not need to do liver ultrasound possibly CT/MRI possible bone work-up.  Alkaline phosphatase trending upward - Gamma GT  2. Essential hypertension Blood pressure good control continue current measures  3. Type 2 diabetes mellitus with stage 2 chronic kidney disease, without long-term current use of insulin (HCC) Diabetes good control watch portions continue current measures  4. Insomnia, unspecified type May uses Xanax at nighttime she denies it causing any issues.  Minimize use during the day  5. Other chronic back pain The patient was seen in followup for chronic pain. A review over at their current pain status was discussed. Drug registry was checked. Prescriptions were given.  Regular follow-up recommended. Discussion was held regarding the importance of compliance with medication as well as pain medication contract.  Patient was informed that medication may cause drowsiness and should not be combined  with other medications/alcohol or street drugs. If the patient feels medication is causing altered alertness then do not drive or operate dangerous equipment.  Drug registry checked 3 prescriptions were written Follow-up in approximately 3 months  6. Diabetic peripheral neuropathy (HCC) We will try low-dose Cymbalta to see if this helps patient will do a follow-up with [place] 3 months to see how things are going

## 2021-02-16 DIAGNOSIS — R748 Abnormal levels of other serum enzymes: Secondary | ICD-10-CM | POA: Diagnosis not present

## 2021-02-17 LAB — GAMMA GT: GGT: 26 IU/L (ref 0–60)

## 2021-02-19 NOTE — Addendum Note (Signed)
Addended by: Dairl Ponder on: 02/19/2021 10:59 AM   Modules accepted: Orders

## 2021-04-10 ENCOUNTER — Other Ambulatory Visit: Payer: Self-pay

## 2021-04-10 ENCOUNTER — Other Ambulatory Visit: Payer: Self-pay | Admitting: Family Medicine

## 2021-04-10 ENCOUNTER — Ambulatory Visit (INDEPENDENT_AMBULATORY_CARE_PROVIDER_SITE_OTHER): Payer: Medicare Other

## 2021-04-10 ENCOUNTER — Telehealth: Payer: Self-pay | Admitting: Family Medicine

## 2021-04-10 DIAGNOSIS — Z0000 Encounter for general adult medical examination without abnormal findings: Secondary | ICD-10-CM | POA: Diagnosis not present

## 2021-04-10 MED ORDER — CEFDINIR 300 MG PO CAPS: 300.0000 mg | ORAL_CAPSULE | Freq: Two times a day (BID) | ORAL | 0 refills | Status: DC

## 2021-04-10 NOTE — Telephone Encounter (Signed)
Check up on 02/12/21

## 2021-04-10 NOTE — Progress Notes (Addendum)
Subjective:   Elizabeth Aguilar is a 80 y.o. female who presents for an Initial Medicare Annual Wellness Visit.  I connected with Elizabeth Aguilar today by telephone and verified that I am speaking with the correct person using two identifiers. Location patient: home Location provider: work Persons participating in the virtual visit: patient, provider.   I discussed the limitations, risks, security and privacy concerns of performing an evaluation and management service by telephone and the availability of in person appointments. I also discussed with the patient that there may be a patient responsible charge related to this service. The patient expressed understanding and verbally consented to this telephonic visit.    Interactive audio and video telecommunications were attempted between this provider and patient, however failed, due to patient having technical difficulties OR patient did not have access to video capability.  We continued and completed visit with audio only.      Review of Systems    N/A  Cardiac Risk Factors include: advanced age (>92men, >17 women);diabetes mellitus;dyslipidemia;hypertension     Objective:    Today's Vitals   There is no height or weight on file to calculate BMI.  Advanced Directives 04/10/2021 10/05/2018 09/21/2018 09/17/2018 12/18/2017 06/21/2017 06/20/2017  Does Patient Have a Medical Advance Directive? No No No No No No No  Would patient like information on creating a medical advance directive? No - Patient declined No - Patient declined No - Patient declined No - Patient declined No - Patient declined No - Patient declined -  Pre-existing out of facility DNR order (yellow form or pink MOST form) - - - - - - -    Current Medications (verified) Outpatient Encounter Medications as of 04/10/2021  Medication Sig  . ALPRAZolam (XANAX) 0.5 MG tablet TAKE 1/2 TABLET DURING THE DAY & TAKE 1 TABLET AT BEDTIME AS NEEDED  . amLODipine (NORVASC) 2.5 MG tablet TAKE  ONE (1) TABLET EACH DAY  . aspirin EC 81 MG EC tablet Take 1 tablet (81 mg total) by mouth daily.  [name] atorvastatin (LIPITOR) 40 MG tablet TAKE 1 TABLET DAILY AT 6PM  . DULoxetine (CYMBALTA) 20 MG capsule Take one tablet po daily  . esomeprazole (NEXIUM) 40 MG capsule TAKE ONE CAPSULE EACH MORNING  . fluticasone (FLONASE) 50 MCG/ACT nasal spray Place 2 sprays into both nostrils daily.  [name] HYDROcodone-acetaminophen (NORCO/VICODIN) 5-325 MG tablet 1 qid prn  . HYDROcodone-acetaminophen (NORCO/VICODIN) 5-325 MG tablet Take one tablet qid prn pain  . HYDROcodone-acetaminophen (NORCO/VICODIN) 5-325 MG tablet Take one tablet qid prn pain  . loratadine (CLARITIN) 10 MG tablet TAKE ONE (1) TABLET EACH DAY  . Magnesium Oxide 400 MG CAPS Take 1 capsule (400 mg total) by mouth daily.  . metFORMIN (GLUCOPHAGE) 500 MG tablet TAKE ONE TABLET BY MOUTH TWICE DAILY  . metoprolol succinate (TOPROL-XL) 50 MG 24 hr tablet Take 1 tablet (50 mg total) by mouth daily. with food  . Multiple Vitamin (MULTIVITAMIN WITH MINERALS) TABS tablet Take 1 tablet by mouth daily.  . ondansetron (ZOFRAN) 8 MG tablet TAKE ONE TABLET EVERY 8 HOURS AS NEEDED  . furosemide (LASIX) 20 MG tablet TAKE ONE (1) TABLET EACH DAY (Patient not taking: Reported on 04/10/2021)  . ketoconazole (NIZORAL) 2 % cream Apply 1 application topically 2 (two) times daily. (Patient not taking: Reported on 04/10/2021)  . nitroGLYCERIN (NITROSTAT) 0.4 MG SL tablet Place 1 tablet (0.4 mg total) under the tongue every 5 (five) minutes x 3 doses as needed for chest pain. (Patient  not taking: Reported on 04/10/2021)  . triamcinolone (KENALOG) 0.1 % Apply small amount twice a day for 10 days (Patient not taking: Reported on 04/10/2021)   No facility-administered encounter medications on file as of 04/10/2021.    Allergies (verified) Augmentin [amoxicillin-pot clavulanate], Ceftin [cefuroxime axetil], Darvocet [propoxyphene n-acetaminophen], Doxycycline, Erythromycin,  Levofloxacin, Sulfa antibiotics, Vioxx [rofecoxib], Zetia [ezetimibe], Zithromax [azithromycin], and Zocor [simvastatin]   History: Past Medical History:  Diagnosis Date  . CAD (coronary artery disease)    a. s/p STEMI in 2015 with DES to LAD and stgaed PCI with DES to RCA  . Chest pain 10/10/2014  . Chronic combined systolic and diastolic congestive heart failure ([place])    a. 03/2017: echo showing mildly reduced EF of 40-45% with Grade 1 DD and mild to moderate MR.   [name] Hyperlipidemia   . Irritable bowel syndrome   . Myocardial infarction ([place])   . Pericarditis   . Type 2 diabetes mellitus ([place])    Past Surgical History:  Procedure Laterality Date  . CATARACT EXTRACTION W/PHACO Left 09/21/2018   Procedure: CATARACT EXTRACTION PHACO AND INTRAOCULAR LENS PLACEMENT LEFT EYE;  Surgeon: Tonny Branch, MD;  Location: [facility];  Service: Ophthalmology;  Laterality: Left;  CDE: 14.50  . CATARACT EXTRACTION W/PHACO Right 10/05/2018   Procedure: CATARACT EXTRACTION PHACO AND INTRAOCULAR LENS PLACEMENT (IOC);  Surgeon: Tonny Branch, MD;  Location: [facility];  Service: Ophthalmology;  Laterality: Right;  CDE: 11.48  . CORONARY ANGIOPLASTY WITH STENT PLACEMENT    . LEFT HEART CATHETERIZATION WITH CORONARY ANGIOGRAM N/A 12/16/2013   Procedure: LEFT HEART CATHETERIZATION WITH CORONARY ANGIOGRAM;  Surgeon: Troy Sine, MD;  Location: [facility] CATH LAB;  Service: Cardiovascular;  Laterality: N/A;  . LEFT HEART CATHETERIZATION WITH CORONARY ANGIOGRAM  12/17/2013   Procedure: LEFT HEART CATHETERIZATION WITH CORONARY ANGIOGRAM;  Surgeon: Troy Sine, MD;  Location: [facility] CATH LAB;  Service: Cardiovascular;;  . PERCUTANEOUS CORONARY STENT INTERVENTION (PCI-S) N/A 12/17/2013   Procedure: PERCUTANEOUS CORONARY STENT INTERVENTION (PCI-S);  Surgeon: Troy Sine, MD;  Location: [facility] CATH LAB;  Service: Cardiovascular;  Laterality: N/A;   Family History  Problem Relation Age of Onset  . Diabetes Mother   . Diabetes Daughter    . Diabetes Son    Social History   Socioeconomic History  . Marital status: Widowed    Spouse name: Not on file  . Number of children: Not on file  . Years of education: Not on file  . Highest education level: Not on file  Occupational History  . Not on file  Tobacco Use  . Smoking status: Never Smoker  . Smokeless tobacco: Never Used  Substance and Sexual Activity  . Alcohol use: No  . Drug use: No  . Sexual activity: Not on file  Other Topics Concern  . Not on file  Social History Narrative  . Not on file   Social Determinants of Health   Financial Resource Strain: Low Risk   . Difficulty of Paying Living Expenses: Not hard at all  Food Insecurity: No Food Insecurity  . Worried About Charity fundraiser in the Last Year: Never true  . Ran Out of Food in the Last Year: Never true  Transportation Needs: No Transportation Needs  . Lack of Transportation (Medical): No  . Lack of Transportation (Non-Medical): No  Physical Activity: Inactive  . Days of Exercise per Week: 0 days  . Minutes of Exercise per Session: 0 min  Stress: No Stress Concern Present  .  Feeling of Stress : Not at all  Social Connections: Socially Isolated  . Frequency of Communication with Friends and Family: More than three times a week  . Frequency of Social Gatherings with Friends and Family: More than three times a week  . Attends Religious Services: Never  . Active Member of Clubs or Organizations: No  . Attends Archivist Meetings: Never  . Marital Status: Widowed    Tobacco Counseling Counseling given: Not Answered   Clinical Intake:  Pre-visit preparation completed: Yes  Pain : 0-10 Pain Type: Acute pain Pain Location: Face (sinus pain) Pain Descriptors / Indicators: Aching Pain Onset: In the past 7 days Pain Frequency: Intermittent Pain Relieving Factors: Tylenol  Pain Relieving Factors: Tylenol  Nutritional Risks: None Diabetes: Yes CBG done?: No Did pt. bring  in CBG monitor from home?: No  How often do you need to have someone help you when you read instructions, pamphlets, or other written materials from your doctor or pharmacy?: 1 - Never  Diabetic?Yes  Nutrition Risk Assessment:  Has the patient had any N/V/D within the last 2 months?  No  Does the patient have any non-healing wounds?  No  Has the patient had any unintentional weight loss or weight gain?  No   Diabetes:  Is the patient diabetic?  Yes  If diabetic, was a CBG obtained today?  No  Did the patient bring in their glucometer from home?  No  How often do you monitor your CBG's? Patient states has not checked once per week.   Financial Strains and Diabetes Management:  Are you having any financial strains with the device, your supplies or your medication? No .  Does the patient want to be seen by Chronic Care Management for management of their diabetes?  No  Would the patient like to be referred to a Nutritionist or for Diabetic Management?  No   Diabetic Exams:  Diabetic Eye Exam: Completed 12/27/2020 Diabetic Foot Exam: Completed 05/04/2020  Interpreter Needed?: No  Information entered by :: [place] of Daily Living In your present state of health, do you have any difficulty performing the following activities: 04/10/2021  Hearing? Y  Comment Patient states has hearing aids  Vision? N  Difficulty concentrating or making decisions? N  Walking or climbing stairs? N  Dressing or bathing? N  Doing errands, shopping? N  Preparing Food and eating ? N  Using the Toilet? N  In the past six months, have you accidently leaked urine? N  Do you have problems with loss of bowel control? N  Managing your Medications? N  Managing your Finances? N  Housekeeping or managing your Housekeeping? N  Some recent data might be hidden    Patient Care Team: Kathyrn Drown, MD as PCP - General (Family Medicine)  Indicate any recent Medical Services you may have  received from other than [facility] providers in the past year (date may be approximate).     Assessment:   This is a routine wellness examination for [facility].  Hearing/Vision screen  Hearing Screening   125Hz  250Hz  500Hz  1000Hz  2000Hz  3000Hz  4000Hz  6000Hz  8000Hz   Right ear:           Left ear:           Vision Screening Comments: Patient states got eyes examined once per year. Currently wears glasses for reading   Dietary issues and exercise activities discussed: Current Exercise Habits: The patient does not participate in regular exercise at  present, Exercise limited by: None identified  Goals Addressed            This Visit's Progress   . Exercise 3x per week (30 min per time)      . Prevent falls        Depression Screen PHQ 2/9 Scores 04/10/2021 11/14/2020 01/12/2019 12/31/2017 09/30/2017 09/06/2016 03/08/2016  PHQ - 2 Score 0 0 0 0 0 0 0  PHQ- 9 Score - - - - 0 - -    Fall Risk Fall Risk  04/10/2021 08/01/2020 01/05/2020 10/25/2019 01/12/2019  Falls in the past year? 1 1 1  0 0  Comment - - - - -  Number falls in past yr: 0 0 1 - 0  Comment - - - - -  Injury with Fall? 0 0 1 - 0  Risk for fall due to : No Fall Risks History of fall(s) Impaired balance/gait - -  Follow up Falls evaluation completed;Falls prevention discussed Falls evaluation completed;Education provided Education provided Falls evaluation completed Falls evaluation completed    FALL RISK PREVENTION PERTAINING TO THE HOME:  Any stairs in or around the home? No  If so, are there any without handrails? No  Home free of loose throw rugs in walkways, pet beds, electrical cords, etc? Yes  Adequate lighting in your home to reduce risk of falls? Yes   ASSISTIVE DEVICES UTILIZED TO PREVENT FALLS:  Life alert? No  Use of a cane, walker or w/c? No  Grab bars in the bathroom? No  Shower chair or bench in shower? Yes  Elevated toilet seat or a handicapped toilet? Yes     Cognitive Function:         Immunizations Immunization History  Administered Date(s) Administered  . Fluad Quad(high Dose 65+) 08/01/2020  . H1N1 10/05/2008  . Influenza Split 08/12/2013  . Influenza,inj,Quad PF,6+ Mos 10/04/2014, 08/22/2015, 09/06/2016  . Influenza-Unspecified Oct 21, 1941, 08/11/2012, 09/30/2017, 08/11/2018, 09/26/2019  . PFIZER(Purple Top)SARS-COV-2 Vaccination 12/13/2019, 01/03/2020  . Pneumococcal Conjugate-13 10/04/2014  . Pneumococcal Polysaccharide-23 07/31/2007  . Tdap 06/20/2017    TDAP status: Up to date  Flu Vaccine status: Up to date  Pneumococcal vaccine status: Up to date  Covid-19 vaccine status: Completed vaccines  Qualifies for Shingles Vaccine? Yes   Zostavax completed No   Shingrix Completed?: No.    Education has been provided regarding the importance of this vaccine. Patient has been advised to call insurance company to determine out of pocket expense if they have not yet received this vaccine. Advised may also receive vaccine at local pharmacy or Health Dept. Verbalized acceptance and understanding.  Screening Tests Health Maintenance  Topic Date Due  . Zoster Vaccines- Shingrix (1 of 2) Never done  . COVID-19 Vaccine (3 - Booster for Pfizer series) 06/01/2020  . URINE MICROALBUMIN  04/28/2021  . FOOT EXAM  05/04/2021  . INFLUENZA VACCINE  06/11/2021  . HEMOGLOBIN A1C  07/26/2021  . OPHTHALMOLOGY EXAM  12/27/2021  . TETANUS/TDAP  06/21/2027  . DEXA SCAN  Completed  . PNA vac Low Risk Adult  Completed  . HPV VACCINES  Aged Out    Health Maintenance  Health Maintenance Due  Topic Date Due  . Zoster Vaccines- Shingrix (1 of 2) Never done  . COVID-19 Vaccine (3 - Booster for Pfizer series) 06/01/2020  . URINE MICROALBUMIN  04/28/2021    Colorectal cancer screening: No longer required.   Mammogram status: Ordered 04/10/2021. Pt provided with contact info and advised to call to schedule  appt.   Bone Density status: Completed 01/06/2017. Results reflect:  Bone density results: OSTEOPENIA. Repeat every 5 years.  Lung Cancer Screening: (Low Dose CT Chest recommended if Age 44-80 years, 30 pack-year currently smoking OR have quit w/in 15years.) does not qualify.   Lung Cancer Screening Referral: N/A   Additional Screening:  Hepatitis C Screening: does not qualify;   Vision Screening: Recommended annual ophthalmology exams for early detection of glaucoma and other disorders of the eye. Is the patient up to date with their annual eye exam?  Yes  Who is the provider or what is the name of the office in which the patient attends annual eye exams? [facility] in [place] If pt is not established with a provider, would they like to be referred to a provider to establish care? No .   Dental Screening: Recommended annual dental exams for proper oral hygiene  Community Resource Referral / Chronic Care Management: CRR required this visit?  No   CCM required this visit?  No      Plan:     I have personally reviewed and noted the following in the patient's chart:   . Medical and social history . Use of alcohol, tobacco or illicit drugs  . Current medications and supplements including opioid prescriptions. Patient is currently taking opioid prescriptions. Information provided to patient regarding non-opioid alternatives. Patient advised to discuss non-opioid treatment plan with their provider. . Functional ability and status . Nutritional status . Physical activity . Advanced directives . List of other physicians . Hospitalizations, surgeries, and ER visits in previous 12 months . Vitals . Screenings to include cognitive, depression, and falls . Referrals and appointments  In addition, I have reviewed and discussed with patient certain preventive protocols, quality metrics, and best practice recommendations. A written personalized care plan for preventive services as well as general preventive health recommendations were provided to  patient.     Ofilia Neas, LPN   05/11/4102   Nurse Notes: None

## 2021-04-10 NOTE — Patient Instructions (Signed)
Elizabeth Aguilar , Thank you for taking time to come for your Medicare Wellness Visit. I appreciate your ongoing commitment to your health goals. Please review the following plan we discussed and let me know if I can assist you in the future.   Screening recommendations/referrals: Colonoscopy: No longer required  Mammogram: Patient declined  Bone Density: Patient declined  Recommended yearly ophthalmology/optometry visit for glaucoma screening and checkup Recommended yearly dental visit for hygiene and checkup  Vaccinations: Influenza vaccine: Up to date, next due fall 2022  Pneumococcal vaccine: Completed series  Tdap vaccine: Up to date, next due 06/21/2027 Shingles vaccine: Patient declined     Advanced directives: Advance directive discussed with you today. Even though you declined this today please call our office should you change your mind and we can give you the proper paperwork for you to fill out.   Conditions/risks identified: None   Next appointment: 04/16/2022 @ 1:40 PM with [place] 65 Years and Older, Female Preventive care refers to lifestyle choices and visits with your health care provider that can promote health and wellness. What does preventive care include?  A yearly physical exam. This is also called an annual well check.  Dental exams once or twice a year.  Routine eye exams. Ask your health care provider how often you should have your eyes checked.  Personal lifestyle choices, including:  Daily care of your teeth and gums.  Regular physical activity.  Eating a healthy diet.  Avoiding tobacco and drug use.  Limiting alcohol use.  Practicing safe sex.  Taking low-dose aspirin every day.  Taking vitamin and mineral supplements as recommended by your health care provider. What happens during an annual well check? The services and screenings done by your health care provider during your annual well check will depend on  your age, overall health, lifestyle risk factors, and family history of disease. Counseling  Your health care provider may ask you questions about your:  Alcohol use.  Tobacco use.  Drug use.  Emotional well-being.  Home and relationship well-being.  Sexual activity.  Eating habits.  History of falls.  Memory and ability to understand (cognition).  Work and work Statistician.  Reproductive health. Screening  You may have the following tests or measurements:  Height, weight, and BMI.  Blood pressure.  Lipid and cholesterol levels. These may be checked every 5 years, or more frequently if you are over 49 years old.  Skin check.  Lung cancer screening. You may have this screening every year starting at age 71 if you have a 30-pack-year history of smoking and currently smoke or have quit within the past 15 years.  Fecal occult blood test (FOBT) of the stool. You may have this test every year starting at age 64.  Flexible sigmoidoscopy or colonoscopy. You may have a sigmoidoscopy every 5 years or a colonoscopy every 10 years starting at age 47.  Hepatitis C blood test.  Hepatitis B blood test.  Sexually transmitted disease (STD) testing.  Diabetes screening. This is done by checking your blood sugar (glucose) after you have not eaten for a while (fasting). You may have this done every 1-3 years.  Bone density scan. This is done to screen for osteoporosis. You may have this done starting at age 1.  Mammogram. This may be done every 1-2 years. Talk to your health care provider about how often you should have regular mammograms. Talk with your health care provider about your test  results, treatment options, and if necessary, the need for more tests. Vaccines  Your health care provider may recommend certain vaccines, such as:  Influenza vaccine. This is recommended every year.  Tetanus, diphtheria, and acellular pertussis (Tdap, Td) vaccine. You may need a Td booster  every 10 years.  Zoster vaccine. You may need this after age 27.  Pneumococcal 13-valent conjugate (PCV13) vaccine. One dose is recommended after age 81.  Pneumococcal polysaccharide (PPSV23) vaccine. One dose is recommended after age 30. Talk to your health care provider about which screenings and vaccines you need and how often you need them. This information is not intended to replace advice given to you by your health care provider. Make sure you discuss any questions you have with your health care provider. Document Released: 11/24/2015 Document Revised: 07/17/2016 Document Reviewed: 08/29/2015 Elsevier Interactive Patient Education  2017 [employer] Prevention in the Home Falls can cause injuries. They can happen to people of all ages. There are many things you can do to make your home safe and to help prevent falls. What can I do on the outside of my home?  Regularly fix the edges of walkways and driveways and fix any cracks.  Remove anything that might make you trip as you walk through a door, such as a raised step or threshold.  Trim any bushes or trees on the path to your home.  Use bright outdoor lighting.  Clear any walking paths of anything that might make someone trip, such as rocks or tools.  Regularly check to see if handrails are loose or broken. Make sure that both sides of any steps have handrails.  Any raised decks and porches should have guardrails on the edges.  Have any leaves, snow, or ice cleared regularly.  Use sand or salt on walking paths during winter.  Clean up any spills in your garage right away. This includes oil or grease spills. What can I do in the bathroom?  Use night lights.  Install grab bars by the toilet and in the tub and shower. Do not use towel bars as grab bars.  Use non-skid mats or decals in the tub or shower.  If you need to sit down in the shower, use a plastic, non-slip stool.  Keep the floor dry. Clean up any  water that spills on the floor as soon as it happens.  Remove soap buildup in the tub or shower regularly.  Attach bath mats securely with double-sided non-slip rug tape.  Do not have throw rugs and other things on the floor that can make you trip. What can I do in the bedroom?  Use night lights.  Make sure that you have a light by your bed that is easy to reach.  Do not use any sheets or blankets that are too big for your bed. They should not hang down onto the floor.  Have a firm chair that has side arms. You can use this for support while you get dressed.  Do not have throw rugs and other things on the floor that can make you trip. What can I do in the kitchen?  Clean up any spills right away.  Avoid walking on wet floors.  Keep items that you use a lot in easy-to-reach places.  If you need to reach something above you, use a strong step stool that has a grab bar.  Keep electrical cords out of the way.  Do not use floor polish or wax that makes  floors slippery. If you must use wax, use non-skid floor wax.  Do not have throw rugs and other things on the floor that can make you trip. What can I do with my stairs?  Do not leave any items on the stairs.  Make sure that there are handrails on both sides of the stairs and use them. Fix handrails that are broken or loose. Make sure that handrails are as long as the stairways.  Check any carpeting to make sure that it is firmly attached to the stairs. Fix any carpet that is loose or worn.  Avoid having throw rugs at the top or bottom of the stairs. If you do have throw rugs, attach them to the floor with carpet tape.  Make sure that you have a light switch at the top of the stairs and the bottom of the stairs. If you do not have them, ask someone to add them for you. What else can I do to help prevent falls?  Wear shoes that:  Do not have high heels.  Have rubber bottoms.  Are comfortable and fit you well.  Are closed  at the toe. Do not wear sandals.  If you use a stepladder:  Make sure that it is fully opened. Do not climb a closed stepladder.  Make sure that both sides of the stepladder are locked into place.  Ask someone to hold it for you, if possible.  Clearly mark and make sure that you can see:  Any grab bars or handrails.  First and last steps.  Where the edge of each step is.  Use tools that help you move around (mobility aids) if they are needed. These include:  Canes.  Walkers.  Scooters.  Crutches.  Turn on the lights when you go into a dark area. Replace any light bulbs as soon as they burn out.  Set up your furniture so you have a clear path. Avoid moving your furniture around.  If any of your floors are uneven, fix them.  If there are any pets around you, be aware of where they are.  Review your medicines with your doctor. Some medicines can make you feel dizzy. This can increase your chance of falling. Ask your doctor what other things that you can do to help prevent falls. This information is not intended to replace advice given to you by your health care provider. Make sure you discuss any questions you have with your health care provider. Document Released: 08/24/2009 Document Revised: 04/04/2016 Document Reviewed: 12/02/2014 Elsevier Interactive Patient Education  2017 [employer].

## 2021-04-10 NOTE — Telephone Encounter (Signed)
Nurses-May send in Obetz 300 mg twice daily for 7 days she is not allergic to this medicine she should take it with a snack to lessen the chance of upset stomach she should be encouraged to take the medication because she has so many antibiotic sensitivities if it is difficult to find a medicine that she does not have a potential sensitivity to

## 2021-04-10 NOTE — Telephone Encounter (Signed)
During Medicare wellness visit, patient c/o sinus pressure in her face and cheeks with post nasal drip x1 week. Patient would like to know if you would be willing to call her in an antibiotic. Please advise?

## 2021-04-10 NOTE — Telephone Encounter (Signed)
Prescription sent electronically to pharmacy.  Patient notified and verbalized understanding. 

## 2021-04-13 ENCOUNTER — Other Ambulatory Visit: Payer: Self-pay | Admitting: Family Medicine

## 2021-04-13 NOTE — Telephone Encounter (Signed)
Last pain management 4/4. Has upcoming appt in july

## 2021-05-01 DIAGNOSIS — M461 Sacroiliitis, not elsewhere classified: Secondary | ICD-10-CM | POA: Diagnosis not present

## 2021-05-01 DIAGNOSIS — Z1589 Genetic susceptibility to other disease: Secondary | ICD-10-CM | POA: Diagnosis not present

## 2021-05-01 DIAGNOSIS — M150 Primary generalized (osteo)arthritis: Secondary | ICD-10-CM | POA: Diagnosis not present

## 2021-05-01 DIAGNOSIS — M2550 Pain in unspecified joint: Secondary | ICD-10-CM | POA: Diagnosis not present

## 2021-05-01 DIAGNOSIS — Z6828 Body mass index (BMI) 28.0-28.9, adult: Secondary | ICD-10-CM | POA: Diagnosis not present

## 2021-05-01 DIAGNOSIS — E663 Overweight: Secondary | ICD-10-CM | POA: Diagnosis not present

## 2021-05-01 DIAGNOSIS — M545 Low back pain, unspecified: Secondary | ICD-10-CM | POA: Diagnosis not present

## 2021-05-04 DIAGNOSIS — R748 Abnormal levels of other serum enzymes: Secondary | ICD-10-CM | POA: Diagnosis not present

## 2021-05-05 LAB — HEPATIC FUNCTION PANEL
ALT: 14 IU/L (ref 0–32)
AST: 15 IU/L (ref 0–40)
Albumin: 4.1 g/dL (ref 3.7–4.7)
Alkaline Phosphatase: 160 IU/L — ABNORMAL HIGH (ref 44–121)
Bilirubin Total: 0.4 mg/dL (ref 0.0–1.2)
Bilirubin, Direct: 0.15 mg/dL (ref 0.00–0.40)
Total Protein: 6.2 g/dL (ref 6.0–8.5)

## 2021-05-07 DIAGNOSIS — N3001 Acute cystitis with hematuria: Secondary | ICD-10-CM | POA: Diagnosis not present

## 2021-05-07 DIAGNOSIS — R300 Dysuria: Secondary | ICD-10-CM | POA: Diagnosis not present

## 2021-05-11 ENCOUNTER — Other Ambulatory Visit: Payer: Self-pay | Admitting: Family Medicine

## 2021-05-11 ENCOUNTER — Ambulatory Visit (INDEPENDENT_AMBULATORY_CARE_PROVIDER_SITE_OTHER): Payer: Medicare Other | Admitting: Family Medicine

## 2021-05-11 ENCOUNTER — Other Ambulatory Visit: Payer: Self-pay

## 2021-05-11 VITALS — BP 122/70 | HR 87 | Temp 97.4°F | Ht 65.0 in | Wt 157.0 lb

## 2021-05-11 DIAGNOSIS — I129 Hypertensive chronic kidney disease with stage 1 through stage 4 chronic kidney disease, or unspecified chronic kidney disease: Secondary | ICD-10-CM | POA: Diagnosis not present

## 2021-05-11 DIAGNOSIS — N182 Chronic kidney disease, stage 2 (mild): Secondary | ICD-10-CM | POA: Diagnosis not present

## 2021-05-11 DIAGNOSIS — E1122 Type 2 diabetes mellitus with diabetic chronic kidney disease: Secondary | ICD-10-CM | POA: Diagnosis not present

## 2021-05-11 DIAGNOSIS — E1142 Type 2 diabetes mellitus with diabetic polyneuropathy: Secondary | ICD-10-CM | POA: Diagnosis not present

## 2021-05-11 DIAGNOSIS — E1169 Type 2 diabetes mellitus with other specified complication: Secondary | ICD-10-CM

## 2021-05-11 DIAGNOSIS — I10 Essential (primary) hypertension: Secondary | ICD-10-CM

## 2021-05-11 DIAGNOSIS — E785 Hyperlipidemia, unspecified: Secondary | ICD-10-CM

## 2021-05-11 MED ORDER — GABAPENTIN 100 MG PO CAPS: 100.0000 mg | ORAL_CAPSULE | Freq: Every day | ORAL | 5 refills | Status: DC

## 2021-05-11 MED ORDER — HYDROCODONE-ACETAMINOPHEN 5-325 MG PO TABS: ORAL_TABLET | ORAL | 0 refills | Status: DC

## 2021-05-11 MED ORDER — ALPRAZOLAM 0.5 MG PO TABS: ORAL_TABLET | ORAL | 5 refills | Status: DC

## 2021-05-11 NOTE — Progress Notes (Signed)
   Subjective:    Patient ID: Elizabeth Aguilar, female    DOB: 18-May-1941, 80 y.o.   MRN: [id number]  HPI This patient was seen today for chronic pain  The medication list was reviewed and updated.  Location of Pain for which the patient has been treated with regarding narcotics: back  Onset of this pain: chronic back pain, knees, feet   -Compliance with medication: daily  - Number patient states they take daily: 2 to 4 tabs daily  -when was the last dose patient took? Last night  The patient was advised the importance of maintaining medication and not using illegal substances with these.  Here for refills and follow up  The patient was educated that we can provide 3 monthly scripts for their medication, it is their responsibility to follow the instructions.  Side effects or complications from medications: none  Patient is aware that pain medications are meant to minimize the severity of the pain to allow their pain levels to improve to allow for better function. They are aware of that pain medications cannot totally remove their pain.  Due for UDT ( at least once per year) : 05/04/2020  Scale of 1 to 10 ( 1 is least 10 is most) Your pain level without the medicine: 8-9 Your pain level with medication 0  Scale 1 to 10 ( 1-helps very little, 10 helps very well) How well does your pain medication reduce your pain so you can function better through out the day? helps  Quality of the pain: aches  Persistence of the pain: daily  Modifying factors: rest      Bilateral foot pain not taking dulaxetine due to constipations  Grief-lost son age 35  Review of Systems     Objective:   Physical Exam  General-in no acute distress Eyes-no discharge Lungs-respiratory rate normal, CTA CV-no murmurs,RRR Extremities skin warm dry no edema Neuro grossly normal Behavior normal, alert       Assessment & Plan:  1. Primary hypertension Blood pressure good control continue  current measures watch diet - Lipid panel - Hepatic function panel  2. Type 2 diabetes mellitus with stage 2 chronic kidney disease, without long-term current use of insulin (HCC) Previous A1c look good she will check this before her next visit she will continue medication watch starches in her diet and stay active - Hemoglobin A1c - Hepatic function panel - Microalbumin / creatinine urine ratio  3. Diabetic peripheral neuropathy ([place]) She is having significant trouble we will try gabapentin hopefully this will be helpful to 100 mg each evening if it causes drowsiness or disorientation stop medicine right away follow-up in 3 months  4. Hyperlipidemia associated with type 2 diabetes mellitus ([place]) Needs to check lab before next visit healthy diet recommended recent liver function look good alkaline phosphatase is coming down - Lipid panel - Hepatic function panel  The patient was seen in followup for chronic pain. A review over at their current pain status was discussed. Drug registry was checked. Prescriptions were given.  Regular follow-up recommended. Discussion was held regarding the importance of compliance with medication as well as pain medication contract.  Patient was informed that medication may cause drowsiness and should not be combined  with other medications/alcohol or street drugs. If the patient feels medication is causing altered alertness then do not drive or operate dangerous equipment. Drug registry checked Patient does benefit from the medicine Medication was sent in

## 2021-06-25 ENCOUNTER — Telehealth: Payer: Self-pay | Admitting: Family Medicine

## 2021-06-25 MED ORDER — CEPHALEXIN 500 MG PO CAPS: ORAL_CAPSULE | ORAL | 0 refills | Status: DC

## 2021-06-25 NOTE — Telephone Encounter (Signed)
Pt contacted. Antibiotic sent to pharmacy. Pt has appt with dentist in [place] on 07/05/21. Pt verbalized understanding

## 2021-06-25 NOTE — Telephone Encounter (Signed)
Please advise. Thank you

## 2021-06-25 NOTE — Telephone Encounter (Signed)
Patient requesting an antibiotic for an abscess tooth. She is in the process of getting them pooled however she doesn't have a dentist that will due to her insurance.  CB# [phone]

## 2021-06-25 NOTE — Telephone Encounter (Signed)
It is important for her to get in with a dentist  Patient should be aware that she has many medications listed as nausea and vomiting including all the antibiotics.  Would be advisable for her to be on an antibiotic So, In the meantime Keflex 500 mg 1 taken 3 times daily for 7 days (I do not feel that she is allergic to Keflex-in the past she had nausea and vomiting not hives) Take with a snack if any ongoing troubles follow-up

## 2021-07-04 ENCOUNTER — Telehealth: Payer: Self-pay | Admitting: Cardiovascular Disease

## 2021-07-04 NOTE — Telephone Encounter (Signed)
Patient states tomorrow, 07/04/21, she will consult with a dentist to discuss having 5-6 teeth extracted. She states the dentist office hasn't requested clearance yet, but when she sees them tomorrow she will inquire. She has concerns with undergoing anesthesia.  She last saw Dr. Claiborne Billings on 09/03/2019--I scheduled her for 08/20/21 with Jory Sims, NP.

## 2021-07-10 ENCOUNTER — Other Ambulatory Visit: Payer: Self-pay | Admitting: Family Medicine

## 2021-08-07 DIAGNOSIS — I10 Essential (primary) hypertension: Secondary | ICD-10-CM | POA: Diagnosis not present

## 2021-08-07 DIAGNOSIS — E785 Hyperlipidemia, unspecified: Secondary | ICD-10-CM | POA: Diagnosis not present

## 2021-08-07 DIAGNOSIS — N182 Chronic kidney disease, stage 2 (mild): Secondary | ICD-10-CM | POA: Diagnosis not present

## 2021-08-07 DIAGNOSIS — E1122 Type 2 diabetes mellitus with diabetic chronic kidney disease: Secondary | ICD-10-CM | POA: Diagnosis not present

## 2021-08-07 DIAGNOSIS — E1169 Type 2 diabetes mellitus with other specified complication: Secondary | ICD-10-CM | POA: Diagnosis not present

## 2021-08-08 LAB — HEPATIC FUNCTION PANEL
ALT: 18 IU/L (ref 0–32)
AST: 16 IU/L (ref 0–40)
Albumin: 4 g/dL (ref 3.6–4.6)
Alkaline Phosphatase: 147 IU/L — ABNORMAL HIGH (ref 44–121)
Bilirubin Total: 0.2 mg/dL (ref 0.0–1.2)
Bilirubin, Direct: <0.1 mg/dL (ref 0.00–0.40)
Total Protein: 6.4 g/dL (ref 6.0–8.5)

## 2021-08-08 LAB — MICROALBUMIN / CREATININE URINE RATIO
Creatinine, Urine: 75.3 mg/dL
Microalb/Creat Ratio: 9 mg/g creat (ref 0–29)
Microalbumin, Urine: 7 ug/mL

## 2021-08-08 LAB — LIPID PANEL
Chol/HDL Ratio: 3.1 ratio (ref 0.0–4.4)
Cholesterol, Total: 172 mg/dL (ref 100–199)
HDL: 55 mg/dL (ref >39)
LDL Chol Calc (NIH): 98 mg/dL (ref 0–99)
Triglycerides: 104 mg/dL (ref 0–149)
VLDL Cholesterol Cal: 19 mg/dL (ref 5–40)

## 2021-08-08 LAB — HEMOGLOBIN A1C
Est. average glucose Bld gHb Est-mCnc: 151 mg/dL
Hgb A1c MFr Bld: 6.9 % — ABNORMAL HIGH (ref 4.8–5.6)

## 2021-08-13 ENCOUNTER — Ambulatory Visit (INDEPENDENT_AMBULATORY_CARE_PROVIDER_SITE_OTHER): Payer: Medicare Other | Admitting: Family Medicine

## 2021-08-13 ENCOUNTER — Other Ambulatory Visit: Payer: Self-pay

## 2021-08-13 VITALS — BP 130/74 | HR 89 | Temp 97.2°F | Ht 65.0 in | Wt 158.0 lb

## 2021-08-13 DIAGNOSIS — E1142 Type 2 diabetes mellitus with diabetic polyneuropathy: Secondary | ICD-10-CM | POA: Diagnosis not present

## 2021-08-13 DIAGNOSIS — I10 Essential (primary) hypertension: Secondary | ICD-10-CM | POA: Diagnosis not present

## 2021-08-13 DIAGNOSIS — E785 Hyperlipidemia, unspecified: Secondary | ICD-10-CM

## 2021-08-13 DIAGNOSIS — E1169 Type 2 diabetes mellitus with other specified complication: Secondary | ICD-10-CM | POA: Diagnosis not present

## 2021-08-13 MED ORDER — ALPRAZOLAM 0.5 MG PO TABS: ORAL_TABLET | ORAL | 5 refills | Status: DC

## 2021-08-13 MED ORDER — HYDROCODONE-ACETAMINOPHEN 5-325 MG PO TABS: ORAL_TABLET | ORAL | 0 refills | Status: DC

## 2021-08-13 MED ORDER — EZETIMIBE 10 MG PO TABS: 10.0000 mg | ORAL_TABLET | Freq: Every day | ORAL | 3 refills | Status: DC

## 2021-08-13 MED ORDER — FLUTICASONE PROPIONATE 50 MCG/ACT NA SUSP: 2.0000 | Freq: Every day | NASAL | 12 refills | Status: DC

## 2021-08-13 NOTE — Progress Notes (Signed)
Subjective:    Patient ID: Elizabeth Aguilar, female    DOB: May 02, 1941, 80 y.o.   MRN: [id number]  Hypertension  Diabetes  Lab results Results for orders placed or performed in visit on 05/11/21  Hemoglobin A1c  Result Value Ref Range   Hgb A1c MFr Bld 6.9 (H) 4.8 - 5.6 %   Est. average glucose Bld gHb Est-mCnc 151 mg/dL  Lipid panel  Result Value Ref Range   Cholesterol, Total 172 100 - 199 mg/dL   Triglycerides 104 0 - 149 mg/dL   HDL 55 >39 mg/dL   VLDL Cholesterol Cal 19 5 - 40 mg/dL   LDL Chol Calc (NIH) 98 0 - 99 mg/dL   Chol/HDL Ratio 3.1 0.0 - 4.4 ratio  Hepatic function panel  Result Value Ref Range   Total Protein 6.4 6.0 - 8.5 g/dL   Albumin 4.0 3.6 - 4.6 g/dL   Bilirubin Total 0.2 0.0 - 1.2 mg/dL   Bilirubin, Direct <0.10 0.00 - 0.40 mg/dL   Alkaline Phosphatase 147 (H) 44 - 121 IU/L   AST 16 0 - 40 IU/L   ALT 18 0 - 32 IU/L  Microalbumin / creatinine urine ratio  Result Value Ref Range   Creatinine, Urine 75.3 Not Estab. mg/dL   Microalbumin, Urine 7.0 Not Estab. ug/mL   Microalb/Creat Ratio 9 0 - 29 mg/g creat   Patient for blood pressure check up.  The patient does have hypertension.    Medication compliance-relates compliance with medicine  Blood pressure control recently-does not check it outside office  Dietary compliance-minimizes salt in the diet stays active  The patient was seen today as part of a comprehensive diabetic check up. Patient has diabetes  Compliance-relates good compliance with diet minimizes sugar intake Low sugars-denies any low sugar spells Dietary effort-good dietary effort Foot exam and ophthalmology exam requirements were reviewed    Pain management  This patient was seen today for chronic pain  The medication list was reviewed and updated.  Location of Pain for which the patient has been treated with regarding narcotics: lowe back pain  Onset of this pain: chronic   -Compliance with medication: yes  - Number  patient states they take daily: 1 to 3   -when was the last dose patient took? Last night - not while driving  The patient was advised the importance of maintaining medication and not using illegal substances with these.  Here for refills and follow up  The patient was educated that we can provide 3 monthly scripts for their medication, it is their responsibility to follow the instructions.  Side effects or complications from medications: no  Patient is aware that pain medications are meant to minimize the severity of the pain to allow their pain levels to improve to allow for better function. They are aware of that pain medications cannot totally remove their pain.  Due for UDT ( at least once per year) : 6/21  Scale of 1 to 10 ( 1 is least 10 is most) Your pain level without the medicine: 9 Your pain level with medication 4  Scale 1 to 10 ( 1-helps very little, 10 helps very well) How well does your pain medication reduce your pain so you can function better through out the day? 5  Quality of the pain: achy  Persistence of the pain: with activity  Modifying factors: sitting  We did discuss her pain medicine she states she does not need as much is what she  used to we will cut it down to just 3/day she has a desire once she gets her teeth fixed to potentially stay off of the pain medicine we would work with her on this  As for her and Xanax she only uses this at nighttime but occasionally takes it during the day denies drowsiness with the medicine uses it at night for sleep uses it during the day to help with anxiousness     Review of Systems     Objective:   Physical Exam General-in no acute distress Eyes-no discharge Lungs-respiratory rate normal, CTA CV-no murmurs,RRR Extremities skin warm dry no edema Neuro grossly normal Behavior normal, alert  Patient also has neuropathic issues in the feet but nothing severe       Assessment & Plan:  Significant  cavities She will see dentist this week and discuss what type of procedure they are going to him to do plus also what type of anesthesia and they will have to do she will let us know  Blood pressure decent control continue current measures  Diabetes better control continue current measures  Hyperlipidemia patient states she cannot tolerate higher dose of Lipitor therefore add Zetia 1 daily.  Pain management Reduce pain medicine and no more than 3/day 3 scripts given drug registry checked The patient was seen in followup for chronic pain. A review over at their current pain status was discussed. Drug registry was checked. Prescriptions were given.  Regular follow-up recommended. Discussion was held regarding the importance of compliance with medication as well as pain medication contract.  Patient was informed that medication may cause drowsiness and should not be combined  with other medications/alcohol or street drugs. If the patient feels medication is causing altered alertness then do not drive or operate dangerous equipment.  Insomnia Xanax at nighttime only  Anxiety continue current measures use of Xanax during the day was discussed

## 2021-08-14 ENCOUNTER — Other Ambulatory Visit: Payer: Self-pay | Admitting: Family Medicine

## 2021-08-14 NOTE — Telephone Encounter (Signed)
Patient was seen yesterday for pain management and was pre-screened at check-in window told us had no symptoms but calling today stating she has sinus infection,sore throat and coughing wanting something called into the drug store in [place]

## 2021-08-15 ENCOUNTER — Telehealth: Payer: Self-pay | Admitting: Family Medicine

## 2021-08-15 ENCOUNTER — Other Ambulatory Visit: Payer: Self-pay | Admitting: Family Medicine

## 2021-08-15 MED ORDER — CEFDINIR 300 MG PO CAPS: 300.0000 mg | ORAL_CAPSULE | Freq: Two times a day (BID) | ORAL | 0 refills | Status: AC

## 2021-08-15 NOTE — Telephone Encounter (Signed)
Nurses Please let patient know that I sent in a prescription for Omnicef 1 twice daily for 1 week Let her also be aware that this antibiotic should help her  She should also be aware that her side effect profile and allergy list covers every single class of antibiotics but according to the records she has taken this medicine before and tolerated it.  If any concerning symptoms allergy symptoms stop medicine take Benadryl go to ER or call us if less urgent issues

## 2021-08-15 NOTE — Telephone Encounter (Signed)
Patient states she has had 2 negative covid tests but her grandbaby has RSV and she has been around her so she is sure that is what it is but needs antibiotic called in. See allergies- Please advise

## 2021-08-15 NOTE — Telephone Encounter (Signed)
Nurses More likely that this is a viral illness Patient does get frequent sinus infections I do recommend that she does at home COVID test if positive notify us We will cover for the possibility of a sinus infection with amoxicillin 500 mg 1 taken 3 times daily for 7 days If ongoing trouble will need to be seen here or urgent care

## 2021-08-16 NOTE — Telephone Encounter (Signed)
Patient advised per Dr Nicki Reaper:  Dr Nicki Reaper sent in a prescription for [facility] 1 twice daily for 1 week Let her also be aware that this antibiotic should help her   She should also be aware that her side effect profile and allergy list covers every single class of antibiotics but according to the records she has taken this medicine before and tolerated it.  If any concerning symptoms allergy symptoms stop medicine take Benadryl go to ER or call us if less urgent issues  Patient verbalized understanding.
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
## 2021-08-20 ENCOUNTER — Encounter: Payer: Self-pay | Admitting: Adult Health

## 2021-08-20 ENCOUNTER — Other Ambulatory Visit: Payer: Self-pay

## 2021-08-20 ENCOUNTER — Ambulatory Visit (INDEPENDENT_AMBULATORY_CARE_PROVIDER_SITE_OTHER): Payer: Medicare Other | Admitting: Adult Health

## 2021-08-20 VITALS — BP 146/84 | HR 76 | Ht 63.0 in | Wt 157.0 lb

## 2021-08-20 DIAGNOSIS — I2510 Atherosclerotic heart disease of native coronary artery without angina pectoris: Secondary | ICD-10-CM | POA: Diagnosis not present

## 2021-08-20 DIAGNOSIS — I10 Essential (primary) hypertension: Secondary | ICD-10-CM | POA: Diagnosis not present

## 2021-08-20 DIAGNOSIS — E7800 Pure hypercholesterolemia, unspecified: Secondary | ICD-10-CM | POA: Diagnosis not present

## 2021-08-20 DIAGNOSIS — Z01818 Encounter for other preprocedural examination: Secondary | ICD-10-CM

## 2021-08-20 NOTE — Patient Instructions (Signed)
Medication Instructions:  No Changes *If you need a refill on your cardiac medications before your next appointment, please call your pharmacy*   Lab Work: No Labs If you have labs (blood work) drawn today and your tests are completely normal, you will receive your results only by: [place] (if you have MyChart) OR A paper copy in the mail If you have any lab test that is abnormal or we need to change your treatment, we will call you to review the results.   Testing/Procedures: No Testing   Follow-Up: At [facility], you and your health needs are our priority.  As part of our continuing mission to provide you with exceptional heart care, we have created designated Provider Care Teams.  These Care Teams include your primary Cardiologist (physician) and Advanced Practice Providers (APPs -  Physician Assistants and Nurse Practitioners) who all work together to provide you with the care you need, when you need it.  We recommend signing up for the patient portal called "MyChart".  Sign up information is provided on this After Visit Summary.  MyChart is used to connect with patients for Virtual Visits (Telemedicine).  Patients are able to view lab/test results, encounter notes, upcoming appointments, etc.  Non-urgent messages can be sent to your provider as well.   To learn more about what you can do with MyChart, go to [email or web address].    Your next appointment:   1 year(s)  The format for your next appointment:   In Person  Provider:   Shelva Majestic, MD

## 2021-09-03 ENCOUNTER — Other Ambulatory Visit: Payer: Self-pay

## 2021-09-03 ENCOUNTER — Ambulatory Visit (INDEPENDENT_AMBULATORY_CARE_PROVIDER_SITE_OTHER): Payer: Medicare Other | Admitting: Family Medicine

## 2021-09-03 ENCOUNTER — Encounter: Payer: Self-pay | Admitting: Family Medicine

## 2021-09-03 VITALS — BP 126/84 | HR 84 | Temp 98.2°F | Ht 63.0 in | Wt 156.8 lb

## 2021-09-03 DIAGNOSIS — I2510 Atherosclerotic heart disease of native coronary artery without angina pectoris: Secondary | ICD-10-CM | POA: Diagnosis not present

## 2021-09-03 DIAGNOSIS — J988 Other specified respiratory disorders: Secondary | ICD-10-CM | POA: Diagnosis not present

## 2021-09-03 MED ORDER — AMOXICILLIN 875 MG PO TABS: 875.0000 mg | ORAL_TABLET | Freq: Two times a day (BID) | ORAL | 0 refills | Status: AC

## 2021-09-03 NOTE — Patient Instructions (Signed)
This is likely just a virus. I do not think this is sinusitis.  If you fail to improve, you can start the amoxicillin.  Take   Dr. Lacinda Axon

## 2021-09-03 NOTE — Progress Notes (Signed)
Subjective:  Patient ID: Elizabeth Aguilar, female    DOB: 02-13-1941  Age: 80 y.o. MRN: [id number]  CC: Chief Complaint  Patient presents with   Cough    Patient states she recently had RSV and still had cough and rattling in chest but last night started feeling better with sinus symptoms and sore throat- Patient thinks she needs another antibiotic for sinus infection. Patient states the omnicef she just finished did not help- Covid Negative Patient states she has 9 teeth being pulled next week and needs to get better    HPI:  80 year old female presents with respiratory symptoms.  Patient has recently had RSV.  She states her cough and "rattling" in her chest has improved.  However, she developed sore throat and rhinorrhea this morning.  She states that she believes she has a sinus infection.  She states that she has upcoming dental extractions.  She believes that she needs an antibiotic.  She denies fever.  No shortness of breath.  No relieving factors.  Patient Active Problem List   Diagnosis Date Noted   Respiratory infection 09/03/2021   Hyperlipidemia associated with type 2 diabetes mellitus ([place]) 05/11/2021   Diabetic peripheral neuropathy ([place]) 02/12/2021   Encounter for chronic pain management 05/04/2020   Renal insufficiency 03/05/2018   Calculus of gallbladder without cholecystitis without obstruction 03/05/2018   Gouty arthropathy 02/20/2017   Primary osteoarthritis of both knees 02/20/2017   Pain of left sacroiliac joint 02/20/2017   Primary osteoarthritis of both hips 02/20/2017   Primary osteoarthritis of both hands 02/20/2017   Primary osteoarthritis of both feet 02/20/2017   DDD (degenerative disc disease), lumbar 02/20/2017   History of coronary artery disease 02/20/2017   History of CHF (congestive heart failure) 02/20/2017   CAD in native artery 12/05/2015   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   Pain in the chest    Chest pain 08/06/2015   Back pain  08/06/2015   GERD (gastroesophageal reflux disease) 05/26/2015   Chronic combined systolic and diastolic heart failure ([place])    Pericarditis 10/10/2014   Osteopenia 06/03/2014   Sinus tachycardia 01/18/2014   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 12/17/2013    Class: Diagnosis of   Ischemic cardiomyopathy 12/17/2013    Class: Diagnosis of   STEMI 12/16/13- LAD DES 12/16/2013   Chronic back pain 08/12/2013   Hyperlipemia 04/19/2013   HTN (hypertension) 04/19/2013   Type 2 diabetes mellitus with stage 2 chronic kidney disease, without long-term current use of insulin ([place]) 04/19/2013   Chronic arthralgias of knees and hips 04/19/2013    Social Hx   Social History   Socioeconomic History   Marital status: Widowed    Spouse name: Not on file   Number of children: Not on file   Years of education: Not on file   Highest education level: Not on file  Occupational History   Not on file  Tobacco Use   Smoking status: Never   Smokeless tobacco: Never  Substance and Sexual Activity   Alcohol use: No   Drug use: No   Sexual activity: Not on file  Other Topics Concern   Not on file  Social History Narrative   Not on file   Social Determinants of Health   Financial Resource Strain: Low Risk    Difficulty of Paying Living Expenses: Not hard at all  Food Insecurity: No Food Insecurity   Worried About [place] in the Last Year: Never true   Ran Out  of Food in the Last Year: Never true  Transportation Needs: No Transportation Needs   Lack of Transportation (Medical): No   Lack of Transportation (Non-Medical): No  Physical Activity: Inactive   Days of Exercise per Week: 0 days   Minutes of Exercise per Session: 0 min  Stress: No Stress Concern Present   Feeling of Stress : Not at all  Social Connections: Socially Isolated   Frequency of Communication with Friends and Family: More than three times a week   Frequency of Social Gatherings with Friends and Family: More than three  times a week   Attends Religious Services: Never   Marine scientist or Organizations: No   Attends Archivist Meetings: Never   Marital Status: Widowed    Review of Systems  Constitutional:  Negative for fever.  HENT:  Positive for rhinorrhea and sore throat.     Objective:  BP 126/84   Pulse 84   Temp 98.2 F (36.8 C) (Oral)   Ht '5\' 3"'  (1.6 m)   Wt 156 lb 12.8 oz (71.1 kg)   SpO2 98%   BMI 27.78 kg/m   BP/Weight 09/03/2021 08/20/2021 95/0/9326  Systolic BP 712 458 099  Diastolic BP 84 84 74  Wt. (Lbs) 156.8 157 158  BMI 27.78 27.81 26.29    Physical Exam Constitutional:      General: She is not in acute distress.    Appearance: Normal appearance. She is not ill-appearing.  HENT:     Head: Normocephalic and atraumatic.     Right Ear: Tympanic membrane normal.     Left Ear: Tympanic membrane normal.     Mouth/Throat:     Comments: Mild oropharyngeal erythema. Eyes:     General:        Right eye: No discharge.        Left eye: No discharge.     Conjunctiva/sclera: Conjunctivae normal.  Cardiovascular:     Rate and Rhythm: Normal rate and regular rhythm.  Pulmonary:     Effort: Pulmonary effort is normal.     Breath sounds: Normal breath sounds. No wheezing or rales.  Neurological:     Mental Status: She is alert.    Lab Results  Component Value Date   WBC 8.3 01/05/2020   HGB 12.7 01/05/2020   HCT 37.9 01/05/2020   PLT 336 01/05/2020   GLUCOSE 127 (H) 01/23/2021   CHOL 172 08/07/2021   TRIG 104 08/07/2021   HDL 55 08/07/2021   LDLCALC 98 08/07/2021   ALT 18 08/07/2021   AST 16 08/07/2021   NA 142 01/23/2021   K 4.6 01/23/2021   CL 102 01/23/2021   CREATININE 1.05 (H) 01/23/2021   BUN 16 01/23/2021   CO2 24 01/23/2021   TSH 1.790 02/21/2015   INR 0.96 06/20/2017   HGBA1C 6.9 (H) 08/07/2021   MICROALBUR 0.50 04/19/2013     Assessment & Plan:   Problem List Items Addressed This Visit       Respiratory   Respiratory  infection - Primary    I advised the patient that I did not feel that this was sinusitis. She has mild symptoms with a benign exam. Advised supportive care. Advised that she may start the Amoxicillin if she fails to improve (Wait and See). Patient states that she can take Amoxicillin.       Meds ordered this encounter  Medications   amoxicillin (AMOXIL) 875 MG tablet    Sig: Take 1 tablet (875  mg total) by mouth 2 (two) times daily for 7 days.    Dispense:  14 tablet    Refill:  0    Follow-up:  PRN  Rosita

## 2021-09-03 NOTE — Assessment & Plan Note (Signed)
I advised the patient that I did not feel that this was sinusitis. She has mild symptoms with a benign exam. Advised supportive care. Advised that she may start the Amoxicillin if she fails to improve (Wait and See). Patient states that she can take Amoxicillin.

## 2021-09-12 ENCOUNTER — Other Ambulatory Visit: Payer: Self-pay | Admitting: Family Medicine

## 2021-10-10 ENCOUNTER — Other Ambulatory Visit: Payer: Self-pay | Admitting: Family Medicine

## 2021-10-11 DIAGNOSIS — N3001 Acute cystitis with hematuria: Secondary | ICD-10-CM | POA: Diagnosis not present

## 2021-10-11 DIAGNOSIS — R350 Frequency of micturition: Secondary | ICD-10-CM | POA: Diagnosis not present

## 2021-10-29 ENCOUNTER — Other Ambulatory Visit: Payer: Self-pay | Admitting: Family Medicine

## 2021-11-13 ENCOUNTER — Ambulatory Visit (INDEPENDENT_AMBULATORY_CARE_PROVIDER_SITE_OTHER): Payer: Medicare Other | Admitting: Family Medicine

## 2021-11-13 ENCOUNTER — Telehealth: Payer: Self-pay | Admitting: Family Medicine

## 2021-11-13 ENCOUNTER — Ambulatory Visit (HOSPITAL_COMMUNITY): Payer: Medicare Other

## 2021-11-13 ENCOUNTER — Other Ambulatory Visit: Payer: Self-pay

## 2021-11-13 VITALS — BP 136/64 | HR 79 | Temp 97.7°F | Ht 63.0 in | Wt 154.6 lb

## 2021-11-13 DIAGNOSIS — I10 Essential (primary) hypertension: Secondary | ICD-10-CM

## 2021-11-13 DIAGNOSIS — G8929 Other chronic pain: Secondary | ICD-10-CM | POA: Diagnosis not present

## 2021-11-13 DIAGNOSIS — E1169 Type 2 diabetes mellitus with other specified complication: Secondary | ICD-10-CM | POA: Diagnosis not present

## 2021-11-13 DIAGNOSIS — E785 Hyperlipidemia, unspecified: Secondary | ICD-10-CM

## 2021-11-13 DIAGNOSIS — M8589 Other specified disorders of bone density and structure, multiple sites: Secondary | ICD-10-CM

## 2021-11-13 DIAGNOSIS — E1142 Type 2 diabetes mellitus with diabetic polyneuropathy: Secondary | ICD-10-CM

## 2021-11-13 DIAGNOSIS — F411 Generalized anxiety disorder: Secondary | ICD-10-CM

## 2021-11-13 DIAGNOSIS — M549 Dorsalgia, unspecified: Secondary | ICD-10-CM | POA: Diagnosis not present

## 2021-11-13 MED ORDER — HYDROCODONE-ACETAMINOPHEN 5-325 MG PO TABS: ORAL_TABLET | ORAL | 0 refills | Status: DC

## 2021-11-13 MED ORDER — ESOMEPRAZOLE MAGNESIUM 40 MG PO CPDR
DELAYED_RELEASE_CAPSULE | ORAL | 3 refills | Status: DC
Start: 2021-11-13 — End: 2022-10-10

## 2021-11-13 MED ORDER — KETOCONAZOLE 2 % EX CREA: 1.0000 "application " | TOPICAL_CREAM | Freq: Two times a day (BID) | CUTANEOUS | 4 refills | Status: DC

## 2021-11-13 MED ORDER — METOPROLOL SUCCINATE ER 50 MG PO TB24: 50.0000 mg | ORAL_TABLET | Freq: Every day | ORAL | 1 refills | Status: DC

## 2021-11-13 MED ORDER — METFORMIN HCL 500 MG PO TABS: 500.0000 mg | ORAL_TABLET | Freq: Two times a day (BID) | ORAL | 1 refills | Status: DC

## 2021-11-13 MED ORDER — ALPRAZOLAM 0.5 MG PO TABS: ORAL_TABLET | ORAL | 5 refills | Status: DC

## 2021-11-13 NOTE — Telephone Encounter (Signed)
We are sending it in, I request a little bit of patience on the patient's part.  There is a process that I follow.  I give the nurses orders regarding the medicine, the nurses file this medicine, then I sign off on the medicine, it is not always instantaneous-but it the order will be to the pharmacy by suppertime I appreciate any patience she can give

## 2021-11-13 NOTE — Telephone Encounter (Signed)
Pt just left the office and went to her pharmacy. She was upset that Dr Nicki Reaper didn't call in her pain medication. She requests a call back [phone]

## 2021-11-13 NOTE — Progress Notes (Signed)
Subjective:    Patient ID: Elizabeth Aguilar, female    DOB: 30-Sep-1941, 81 y.o.   MRN: [id number]  HPI 3 month follow up on medications  Patient for blood pressure check up.  The patient does have hypertension.    Medication compliance-relates good compliance  Blood pressure control recently-on today's visit good  Dietary compliance-tries minimize salt  The patient was seen today as part of a comprehensive diabetic check up. Patient has diabetes  Compliance-good compliance Low sugars-no low sugars Dietary effort-tries to minimize sugars in the diet Foot exam and ophthalmology exam requirements were reviewed   This patient was seen today for chronic pain  The medication list was reviewed and updated.  Location of Pain for which the patient has been treated with regarding narcotics: Neck pain back pain knee pain  Onset of this pain: Years ago   -Compliance with medication: Good  - Number patient states they take daily: Takes 2 or 3/day  -when was the last dose patient took?   The patient was advised the importance of maintaining medication and not using illegal substances with these.  Here for refills and follow up  The patient was educated that we can provide 3 monthly scripts for their medication, it is their responsibility to follow the instructions.  Side effects or complications from medications: Denies side effects  Patient is aware that pain medications are meant to minimize the severity of the pain to allow their pain levels to improve to allow for better function. They are aware of that pain medications cannot totally remove their pain.  Due for UDT ( at least once per year) : Due today  Scale of 1 to 10 ( 1 is least 10 is most) Your pain level without the medicine: 7 or 8 Your pain level with medication 4 or 5  Scale 1 to 10 ( 1-helps very little, 10 helps very well) How well does your pain medication reduce your pain so you can function better through  out the day?  7  Quality of the pain: Throbbing aching stiffness discomfort  Persistence of the pain: Present daily  Modifying factors: Worse with activity       Review of Systems     Objective:   Physical Exam  General-in no acute distress Eyes-no discharge Lungs-respiratory rate normal, CTA CV-no murmurs,RRR Extremities skin warm dry no edema Neuro grossly normal Behavior normal, alert       Assessment & Plan:   1. Primary hypertension Blood pressure good control continue current measures  2. Diabetic peripheral neuropathy (HCC) Numbness in the feet important keep A1c under control diabetes under control healthy eating continue medication  3. Hyperlipidemia associated with type 2 diabetes mellitus ([place]) Continue medication watch diet stay active  4. Osteopenia of multiple sites Bone density recommended.  Patient would like to do this in March - DG Bone Density  5. Other chronic back pain Uses her pain medicine see below  6. Encounter for chronic pain management The patient was seen in followup for chronic pain. A review over at their current pain status was discussed. Drug registry was checked. Prescriptions were given.  Regular follow-up recommended. Discussion was held regarding the importance of compliance with medication as well as pain medication contract.  Patient was informed that medication may cause drowsiness and should not be combined  with other medications/alcohol or street drugs. If the patient feels medication is causing altered alertness then do not drive or operate dangerous equipment.  Should  be noted that the patient appears to be meeting appropriate use of opioids and response.  Evidenced by improved function and decent pain control without significant side effects and no evidence of overt aberrancy issues.  Upon discussion with the patient today they understand that opioid therapy is optional and they feel that the pain has been refractory  to reasonable conservative measures and is significant and affecting quality of life enough to warrant ongoing therapy and wishes to continue opioids.  Refills were provided. 3 prescriptions were sent in - ToxASSURE Select 13 (MW), Urine  7. GAD (generalized anxiety disorder) We will discuss this with patient on her next visit that we will need to start cutting back even further than what we have in the past  Follow-up in 3 months

## 2021-11-14 LAB — MED LIST OPTION NOT SELECTED

## 2021-11-16 LAB — SPECIMEN STATUS REPORT

## 2021-11-16 LAB — TOXASSURE SELECT 13 (MW), URINE

## 2021-12-06 DIAGNOSIS — R350 Frequency of micturition: Secondary | ICD-10-CM | POA: Diagnosis not present

## 2021-12-06 DIAGNOSIS — N3001 Acute cystitis with hematuria: Secondary | ICD-10-CM | POA: Diagnosis not present

## 2021-12-27 ENCOUNTER — Telehealth: Payer: Self-pay | Admitting: Cardiovascular Disease

## 2021-12-27 NOTE — Telephone Encounter (Signed)
Pt c/o of Chest Pain: STAT if CP now or developed within 24 hours  1. Are you having CP right now? Not sure, not with her right now  2. Are you experiencing any other symptoms (ex. SOB, nausea, vomiting, sweating)? Had SOB  3. How long have you been experiencing CP? Last night and this morning  4. Is your CP continuous or coming and going? Coming and going  5. Have you taken Nitroglycerin? No, took aspirin   Patient's daughter Neoma Laming states the patient started having chest pain last night and had it again this morning. She says she is not currently with the patient so she is not sure if she is having the symptoms now. She says to call the patient back at: [phone]         ?

## 2021-12-27 NOTE — Telephone Encounter (Signed)
Patient stated that she has chest pain that is like a dull ache that is continuous. Patient stated this has been going on for 3 to 5 weeks off and on. Patient also complaining of SOB that comes and goes. Patient stated it keeps getting worse each time. Encouraged patient to go to the ED to get evaluated. Patient verbalized understanding.

## 2021-12-28 NOTE — Telephone Encounter (Signed)
Agree with recommendation

## 2022-01-07 DIAGNOSIS — N3001 Acute cystitis with hematuria: Secondary | ICD-10-CM | POA: Diagnosis not present

## 2022-01-07 DIAGNOSIS — R300 Dysuria: Secondary | ICD-10-CM | POA: Diagnosis not present

## 2022-01-12 DIAGNOSIS — R509 Fever, unspecified: Secondary | ICD-10-CM | POA: Diagnosis not present

## 2022-01-12 DIAGNOSIS — J029 Acute pharyngitis, unspecified: Secondary | ICD-10-CM | POA: Diagnosis not present

## 2022-01-14 ENCOUNTER — Encounter: Payer: Self-pay | Admitting: Nurse Practitioner

## 2022-01-14 ENCOUNTER — Other Ambulatory Visit: Payer: Self-pay

## 2022-01-14 ENCOUNTER — Other Ambulatory Visit (HOSPITAL_COMMUNITY): Payer: Medicare Other

## 2022-01-14 ENCOUNTER — Ambulatory Visit (INDEPENDENT_AMBULATORY_CARE_PROVIDER_SITE_OTHER): Payer: Medicare Other | Admitting: Nurse Practitioner

## 2022-01-14 VITALS — BP 172/74 | HR 109 | Temp 98.8°F | Ht 63.0 in | Wt 153.0 lb

## 2022-01-14 DIAGNOSIS — I10 Essential (primary) hypertension: Secondary | ICD-10-CM

## 2022-01-14 DIAGNOSIS — J189 Pneumonia, unspecified organism: Secondary | ICD-10-CM | POA: Diagnosis not present

## 2022-01-14 DIAGNOSIS — R9431 Abnormal electrocardiogram [ECG] [EKG]: Secondary | ICD-10-CM | POA: Diagnosis not present

## 2022-01-14 MED ORDER — AMOXICILLIN 500 MG PO CAPS: 1000.0000 mg | ORAL_CAPSULE | Freq: Three times a day (TID) | ORAL | 0 refills | Status: AC

## 2022-01-14 MED ORDER — ALBUTEROL SULFATE HFA 108 (90 BASE) MCG/ACT IN AERS: 2.0000 | INHALATION_SPRAY | Freq: Four times a day (QID) | RESPIRATORY_TRACT | 2 refills | Status: DC | PRN

## 2022-01-14 NOTE — Progress Notes (Signed)
? ?Subjective:  ? ? Patient ID: Elizabeth Aguilar, female    DOB: [birth year], 81 y.o.   MRN: [id number] ? ?HPI ? ?Patient was seen in urgent care 2 days ago for sore throat, fever, runny nose and chest congestion.  Patient was tested for COVID, flu, RSV all were negative.   ? ?Patient presents today with continued symptoms of runny nose and chest congestion.  Patient and states that her chest is rattling.  Patient also admits to feeling chills and coughing up yellow mucus.  Patient denies any chest pain or difficulty breathing or wheezing. ? ?Patient states that she notices that her blood pressure has been elevated lately.  And that her heart has been fluttering.  Patient denies any chest pain denies any shortness of breath.   ? ?Patient has significant cardiac history.  Cardiac history includes  CAD, MI, ischemic cardiomyopathy, pericarditis, systolic and diastolic heart failure. ? ? ?Review of Systems  ?Constitutional:  Positive for chills and fever.  ?HENT:  Positive for congestion, rhinorrhea and sore throat.   ?Respiratory:  Positive for cough.   ?All other systems reviewed and are negative. ? ?   ?Objective:  ? Physical Exam ?Constitutional:   ?   General: She is not in acute distress. ?   Appearance: Normal appearance. She is normal weight. She is not ill-appearing or toxic-appearing.  ?HENT:  ?   Ears:  ?   Comments: Wearing bilateral hearing aids ?   Nose: Congestion and rhinorrhea present.  ?   Mouth/Throat:  ?   Mouth: Mucous membranes are moist.  ?   Pharynx: No oropharyngeal exudate or posterior oropharyngeal erythema.  ?Cardiovascular:  ?   Rate and Rhythm: Tachycardia present. Rhythm irregular.  ?   Pulses: Normal pulses.  ?   Heart sounds: Murmur heard.  ?   Comments: Skipped heartbeats noted during auscultation of heart sounds ?Pulmonary:  ?   Effort: Pulmonary effort is normal.  ?   Breath sounds: Examination of the right-upper field reveals wheezing and rhonchi. Examination of the left-upper field  reveals wheezing and rhonchi. Examination of the left-middle field reveals wheezing and rhonchi. Examination of the left-lower field reveals wheezing and rhonchi. Wheezing and rhonchi present.  ?Skin: ?   General: Skin is warm.  ?   Capillary Refill: Capillary refill takes less than 2 seconds.  ?Neurological:  ?   General: No focal deficit present.  ?   Mental Status: She is alert and oriented to person, place, and time.  ?Psychiatric:     ?   Mood and Affect: Mood normal.     ?   Behavior: Behavior normal.  ? ? ? ? ? ?   ?Assessment & Plan:  ? ?1. Community acquired pneumonia, unspecified laterality ?-Pneumonia highly suspected. ?-Patient allergic to doxycycline and Augmentin.  We will treat with high-dose amoxicillin 1000 mg 3 times a day for 7 days. ?-Use albuterol inhaler for wheezing or shortness of breath. ?- amoxicillin (AMOXIL) 500 MG capsule; Take 2 capsules (1,000 mg total) by mouth 3 (three) times daily for 7 days.  Dispense: 42 capsule; Refill: 0 ?- albuterol (VENTOLIN HFA) 108 (90 Base) MCG/ACT inhaler; Inhale 2 puffs into the lungs every 6 (six) hours as needed for wheezing or shortness of breath.  Dispense: 8 g; Refill: 2 ?-Discussed side effects of albuterol in detail. ?-Patient instructed that if she notices that her heart is racing or fluttering for more than 5 minutes that she should go to the  emergency room. ?-Return to clinic in 2 or 3 days for evaluation ?-COVID, flu, RSV negative ? ?2. Abnormal EKG ?-PACs and PVCs noted on EKG which is a change from previous EKG. ?-Patient typically sees [facility] ?-We will send an urgent referral to [facility] for further evaluation.  ?-We will also CC Dr. Shelva Majestic to this note ?- EKG 12 lead ? ?3. Primary hypertension ?Blood pressure today 184/73.  Upon recheck 172/74.  Patient not at goal of 140/90. ?-Referral to cardiology for further evaluation. ?  ? ? ? ? ?Note:  This document was prepared using Dragon voice recognition  software and may include unintentional dictation errors. ? ? ? ?

## 2022-01-17 ENCOUNTER — Ambulatory Visit (INDEPENDENT_AMBULATORY_CARE_PROVIDER_SITE_OTHER): Payer: Medicare Other | Admitting: Nurse Practitioner

## 2022-01-17 ENCOUNTER — Encounter: Payer: Self-pay | Admitting: Nurse Practitioner

## 2022-01-17 ENCOUNTER — Other Ambulatory Visit: Payer: Self-pay

## 2022-01-17 ENCOUNTER — Ambulatory Visit (HOSPITAL_COMMUNITY)
Admission: RE | Admit: 2022-01-17 | Discharge: 2022-01-17 | Disposition: A | Discharge status: Home / Self Care | Payer: Medicare Other | Source: Ambulatory Visit | Attending: Nurse Practitioner | Admitting: Nurse Practitioner

## 2022-01-17 VITALS — BP 148/82 | HR 85 | Ht 63.0 in | Wt 155.0 lb

## 2022-01-17 DIAGNOSIS — R059 Cough, unspecified: Secondary | ICD-10-CM | POA: Diagnosis not present

## 2022-01-17 DIAGNOSIS — R0602 Shortness of breath: Secondary | ICD-10-CM | POA: Diagnosis not present

## 2022-01-17 DIAGNOSIS — J189 Pneumonia, unspecified organism: Secondary | ICD-10-CM

## 2022-01-17 DIAGNOSIS — R9431 Abnormal electrocardiogram [ECG] [EKG]: Secondary | ICD-10-CM | POA: Diagnosis not present

## 2022-01-17 MED ORDER — CETIRIZINE HCL 10 MG PO TABS
10.0000 mg | ORAL_TABLET | Freq: Every day | ORAL | 3 refills | Status: DC
Start: 2022-01-17 — End: 2022-04-15

## 2022-01-17 MED ORDER — PREDNISONE 20 MG PO TABS: ORAL_TABLET | ORAL | 0 refills | Status: DC

## 2022-01-17 MED ORDER — FLUCONAZOLE 150 MG PO TABS
150.0000 mg | ORAL_TABLET | Freq: Every day | ORAL | 0 refills | Status: DC
Start: 2022-01-17 — End: 2022-02-14

## 2022-01-17 NOTE — Progress Notes (Signed)
? ?Subjective:  ? ? Patient ID: Elizabeth Aguilar, female    DOB: 05/23/1941, 81 y.o.   MRN: [id number] ? ?HPI ? ?Patient with extensive cardiac history here for follow-up of pneumonia diagnosed on March 6.  Patient states that she feels a little better however she still notices rattling in the chest and chills.  Patient denies any shortness of breath, fever, body aches.  Patient states that she is only had to use albuterol once since her last visit.  Patient still states that she has a runny nose and chest congestion.  Patient denies any chest pain, shortness of breath, difficulty breathing.  ? ?Patient was started on amoxicillin during last visit and states that she is tolerating medication okay.  States that she is on her fifth dose medication. ? ?Review of Systems  ?Constitutional:  Positive for chills.  ?HENT:  Positive for congestion and rhinorrhea.   ?All other systems reviewed and are negative. ? ?   ?Objective:  ? Physical Exam ?Constitutional:   ?   General: She is not in acute distress. ?   Appearance: Normal appearance. She is normal weight. She is not ill-appearing or toxic-appearing.  ?Cardiovascular:  ?   Rate and Rhythm: Normal rate and regular rhythm.  ?   Pulses: Normal pulses.  ?   Heart sounds: Normal heart sounds. No murmur heard. ?   Comments: No skipped heartbeats noted during physical exam today. ?Pulmonary:  ?   Effort: Pulmonary effort is normal. No respiratory distress.  ?   Breath sounds: No stridor. Examination of the left-upper field reveals wheezing. Examination of the left-middle field reveals wheezing. Examination of the left-lower field reveals wheezing. Wheezing present. No rhonchi or rales.  ?   Comments: Advantageous lung sounds heard to left pulmonary lobes ?Chest:  ?   Chest wall: No tenderness.  ?Musculoskeletal:     ?   General: Normal range of motion.  ?   Cervical back: Normal range of motion and neck supple.  ?   Right lower leg: No edema.  ?   Left lower leg: No edema.   ?Skin: ?   General: Skin is warm.  ?   Capillary Refill: Capillary refill takes less than 2 seconds.  ?Neurological:  ?   General: No focal deficit present.  ?   Mental Status: She is alert and oriented to person, place, and time.  ?Psychiatric:     ?   Mood and Affect: Mood normal.     ?   Behavior: Behavior normal.  ? ? ? ? ? ?   ?Assessment & Plan:  ? ?1. Community acquired pneumonia, unspecified laterality ?-Patient doing mildly better on amoxicillin prescribed on March 6. ?-We will get chest x-ray to evaluate continue symptoms. ?-We will also start on prednisone taper to assist with symptoms.  Side effects of prednisone discussed in detail with patient. ?-Patient has concerns about developing a yeast infection due to antibiotics.  Fluconazole ordered and instructions given to patient. ?- DG Chest 2 View ?- predniSONE (DELTASONE) 20 MG tablet; Two daily for four days, one daily for three days  Dispense: 11 tablet; Refill: 0 ?- fluconazole (DIFLUCAN) 150 MG tablet; Take 1 tablet (150 mg total) by mouth daily. Take 1 pill every three days as needed for yeast infection.  Dispense: 3 tablet; Refill: 0 ?-Return to clinic in 1 week for evaluation ? ?2.  Abnormal EKG ?-Patient states that she was contacted by cardiology office that she has appointment on  March 16. ?-Follow-up with cardiology as scheduled ? ?  ?Note:  This document was prepared using Dragon voice recognition software and may include unintentional dictation errors. ? ? ?

## 2022-01-23 ENCOUNTER — Other Ambulatory Visit: Payer: Self-pay

## 2022-01-23 ENCOUNTER — Ambulatory Visit (INDEPENDENT_AMBULATORY_CARE_PROVIDER_SITE_OTHER): Payer: Medicare Other | Admitting: Nurse Practitioner

## 2022-01-23 ENCOUNTER — Encounter: Payer: Self-pay | Admitting: Nurse Practitioner

## 2022-01-23 VITALS — BP 162/81 | HR 88 | Temp 97.7°F | Ht 63.0 in | Wt 157.4 lb

## 2022-01-23 DIAGNOSIS — J189 Pneumonia, unspecified organism: Secondary | ICD-10-CM

## 2022-01-23 NOTE — Progress Notes (Signed)
? ?  Subjective:  ? ? Patient ID: Elizabeth Aguilar, female    DOB: October 23, 1941, 81 y.o.   MRN: [id number] ? ?HPI ? ?81 year old with significant cardiac history here for follow-up of pneumonia.  Patient states she feels a lot better.  Patient admits to continued nasal congestion but denies any shortness of breath, chest palpitations, rattling in her chest, fever, chills, body aches.   ? ?Patient has appointment with cardiology tomorrow. ? ?Patient has no other concerns.   ? ?Review of Systems  ?All other systems reviewed and are negative. ? ?   ?Objective:  ? Physical Exam ?Constitutional:   ?   General: She is not in acute distress. ?   Appearance: Normal appearance. She is normal weight. She is not ill-appearing or toxic-appearing.  ?Cardiovascular:  ?   Rate and Rhythm: Normal rate and regular rhythm.  ?   Pulses: Normal pulses.  ?   Heart sounds: Normal heart sounds. No murmur heard. ?Pulmonary:  ?   Effort: Pulmonary effort is normal. No respiratory distress.  ?   Breath sounds: Normal breath sounds. No stridor. No wheezing, rhonchi or rales.  ?Chest:  ?   Chest wall: No tenderness.  ?Skin: ?   General: Skin is warm.  ?   Capillary Refill: Capillary refill takes less than 2 seconds.  ?Neurological:  ?   General: No focal deficit present.  ?   Mental Status: She is alert and oriented to person, place, and time.  ?Psychiatric:     ?   Mood and Affect: Mood normal.     ?   Behavior: Behavior normal.  ? ? ? ? ? ?   ?Assessment & Plan:  ? ?1. Community acquired pneumonia, unspecified laterality ?-Pneumonia resolved. ?-Patient completed course of prednisone and high-dose amoxicillin. ?-May use over-the-counter Nettie pot for congestion. ?-May use Mucinex (no DM) for congestion. ?-Return to clinic for any issues or if cough and chest congestion return. ?-Follow-up with Dr. Nicki Reaper on April 6 for routine follow-up. ?  ?Note:  This document was prepared using Dragon voice recognition software and may include unintentional  dictation errors. ? ?

## 2022-01-24 ENCOUNTER — Encounter: Payer: Self-pay | Admitting: Physician Assistant

## 2022-01-24 ENCOUNTER — Ambulatory Visit (INDEPENDENT_AMBULATORY_CARE_PROVIDER_SITE_OTHER): Payer: Medicare Other | Admitting: Physician Assistant

## 2022-01-24 VITALS — BP 160/70 | HR 77 | Ht 63.0 in | Wt 156.0 lb

## 2022-01-24 DIAGNOSIS — E119 Type 2 diabetes mellitus without complications: Secondary | ICD-10-CM

## 2022-01-24 DIAGNOSIS — I2510 Atherosclerotic heart disease of native coronary artery without angina pectoris: Secondary | ICD-10-CM | POA: Diagnosis not present

## 2022-01-24 DIAGNOSIS — I5042 Chronic combined systolic (congestive) and diastolic (congestive) heart failure: Secondary | ICD-10-CM | POA: Diagnosis not present

## 2022-01-24 DIAGNOSIS — I10 Essential (primary) hypertension: Secondary | ICD-10-CM | POA: Diagnosis not present

## 2022-01-24 DIAGNOSIS — E785 Hyperlipidemia, unspecified: Secondary | ICD-10-CM | POA: Diagnosis not present

## 2022-01-24 MED ORDER — METOPROLOL SUCCINATE ER 50 MG PO TB24: 50.0000 mg | ORAL_TABLET | Freq: Two times a day (BID) | ORAL | 1 refills | Status: DC

## 2022-01-24 NOTE — Patient Instructions (Addendum)
Medication Instructions:  ?INCREASE Metoprolol Succinate (Toprol-XL) to 50  ?*If you need a refill on your cardiac medications before your next appointment, please call your pharmacy* ? ?Lab Work: ?NONE ordered at this time of appointment  ? ?If you have labs (blood work) drawn today and your tests are completely normal, you will receive your results only by: ?MyChart Message (if you have MyChart) OR ?A paper copy in the mail ?If you have any lab test that is abnormal or we need to change your treatment, we will call you to review the results. ? ? ?Testing/Procedures: ?Your physician has requested that you have an echocardiogram. Echocardiography is a painless test that uses sound waves to create images of your heart. It provides your doctor with information about the size and shape of your heart and how well your heart?s chambers and valves are working. This procedure takes approximately one hour. There are no restrictions for this procedure. ? ? Please schedule for [facility] office for 1-2 weeks  ? ?Follow-Up: ?At [facility], you and your health needs are our priority.  As part of our continuing mission to provide you with exceptional heart care, we have created designated Provider Care Teams.  These Care Teams include your primary Cardiologist (physician) and Advanced Practice Providers (APPs -  Physician Assistants and Nurse Practitioners) who all work together to provide you with the care you need, when you need it. ? ?We recommend signing up for the patient portal called "MyChart".  Sign up information is provided on this After Visit Summary.  MyChart is used to connect with patients for Virtual Visits (Telemedicine).  Patients are able to view lab/test results, encounter notes, upcoming appointments, etc.  Non-urgent messages can be sent to your provider as well.   ?To learn more about what you can do with MyChart, go to [email or web address].   ? ?Your next appointment:   ?4 week(s) ? ?The format for  your next appointment:   ?In Person ? ?Provider:   ?[name], PA-C      ? ?Other Instructions ? ? ?
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
## 2022-01-25 ENCOUNTER — Encounter: Payer: Self-pay | Admitting: Physician Assistant

## 2022-01-29 ENCOUNTER — Other Ambulatory Visit (HOSPITAL_BASED_OUTPATIENT_CLINIC_OR_DEPARTMENT_OTHER): Payer: Medicare Other

## 2022-02-05 ENCOUNTER — Other Ambulatory Visit: Payer: Self-pay

## 2022-02-05 ENCOUNTER — Ambulatory Visit (INDEPENDENT_AMBULATORY_CARE_PROVIDER_SITE_OTHER): Payer: Medicare Other

## 2022-02-05 DIAGNOSIS — I2510 Atherosclerotic heart disease of native coronary artery without angina pectoris: Secondary | ICD-10-CM | POA: Diagnosis not present

## 2022-02-05 LAB — ECHOCARDIOGRAM COMPLETE
AR max vel: 1.63 cm2
AV Area VTI: 1.75 cm2
AV Area mean vel: 1.69 cm2
AV Mean grad: 3 mmHg
AV Peak grad: 6 mmHg
Ao pk vel: 1.22 m/s
Area-P 1/2: 3.76 cm2
Radius: 0.7 cm
S' Lateral: 3.43 cm

## 2022-02-07 NOTE — Progress Notes (Signed)
Normal pumping function of heart. Mild to moderate mitral valve leakage.

## 2022-02-11 ENCOUNTER — Other Ambulatory Visit: Payer: Self-pay | Admitting: Cardiovascular Disease

## 2022-02-11 ENCOUNTER — Ambulatory Visit: Payer: Medicare Other | Admitting: Family Medicine

## 2022-02-14 ENCOUNTER — Ambulatory Visit (INDEPENDENT_AMBULATORY_CARE_PROVIDER_SITE_OTHER): Payer: Medicare Other | Admitting: Family Medicine

## 2022-02-14 ENCOUNTER — Other Ambulatory Visit: Payer: Self-pay | Admitting: Family Medicine

## 2022-02-14 VITALS — BP 131/76 | HR 80 | Temp 98.1°F | Ht 63.0 in | Wt 157.6 lb

## 2022-02-14 DIAGNOSIS — E785 Hyperlipidemia, unspecified: Secondary | ICD-10-CM | POA: Diagnosis not present

## 2022-02-14 DIAGNOSIS — N182 Chronic kidney disease, stage 2 (mild): Secondary | ICD-10-CM

## 2022-02-14 DIAGNOSIS — E1122 Type 2 diabetes mellitus with diabetic chronic kidney disease: Secondary | ICD-10-CM

## 2022-02-14 DIAGNOSIS — R300 Dysuria: Secondary | ICD-10-CM

## 2022-02-14 DIAGNOSIS — Z79899 Other long term (current) drug therapy: Secondary | ICD-10-CM | POA: Diagnosis not present

## 2022-02-14 DIAGNOSIS — E1169 Type 2 diabetes mellitus with other specified complication: Secondary | ICD-10-CM | POA: Diagnosis not present

## 2022-02-14 DIAGNOSIS — G8929 Other chronic pain: Secondary | ICD-10-CM | POA: Diagnosis not present

## 2022-02-14 DIAGNOSIS — I10 Essential (primary) hypertension: Secondary | ICD-10-CM

## 2022-02-14 LAB — POCT URINALYSIS DIPSTICK
Bilirubin, UA: NEGATIVE
Blood, UA: NEGATIVE
Glucose, UA: NEGATIVE
Ketones, UA: NEGATIVE
Leukocytes, UA: NEGATIVE
Nitrite, UA: NEGATIVE
Protein, UA: NEGATIVE
Spec Grav, UA: 1.01 (ref 1.010–1.025)
Urobilinogen, UA: 0.2 E.U./dL
pH, UA: 6.5 (ref 5.0–8.0)

## 2022-02-14 MED ORDER — HYDROCODONE-ACETAMINOPHEN 5-325 MG PO TABS
ORAL_TABLET | ORAL | 0 refills | Status: DC
Start: 2022-02-14 — End: 2022-05-08

## 2022-02-14 MED ORDER — CEFDINIR 300 MG PO CAPS: 300.0000 mg | ORAL_CAPSULE | Freq: Two times a day (BID) | ORAL | 0 refills | Status: DC

## 2022-02-14 MED ORDER — HYDROCODONE-ACETAMINOPHEN 5-325 MG PO TABS: ORAL_TABLET | ORAL | 0 refills | Status: DC

## 2022-02-14 MED ORDER — ALPRAZOLAM 0.5 MG PO TABS: ORAL_TABLET | ORAL | 4 refills | Status: DC

## 2022-02-14 NOTE — Progress Notes (Signed)
? ?Subjective:  ? ? Patient ID: [name], female    DOB: [birth year], 81 y.o.   MRN: [id number] ? ?HPI ? ?Patient here for 3 month follow up on pain medication. ?This patient was seen today for chronic pain ? ?The medication list was reviewed and updated. ? ?Location of Pain for which the patient has been treated with regarding narcotics: Low back pain and neck pain ? ?Onset of this pain: Present for years ? ? -Compliance with medication: Compliant ? ?- Number patient states they take daily: She has been able to cut back to approximately 1/day occasionally 1-1/2 ? ?-when was the last dose patient took?  Yesterday ? ?The patient was advised the importance of maintaining medication and not using illegal substances with these. ? ?Here for refills and follow up ? ?The patient was educated that we can provide 3 monthly scripts for their medication, it is their responsibility to follow the instructions. ? ?Side effects or complications from medications: Denies side effects with medicine ? ?Patient is aware that pain medications are meant to minimize the severity of the pain to allow their pain levels to improve to allow for better function. They are aware of that pain medications cannot totally remove their pain. ? ?Due for UDT ( at least once per year) : Earlier this year ? ?Scale of 1 to 10 ( 1 is least 10 is most) ?Your pain level without the medicine: Moderate 7 ?Your pain level with medication 3 ? ?Scale 1 to 10 ( 1-helps very little, 10 helps very well) ?How well does your pain medication reduce your pain so you can function better through out the day?  5 ? ?Quality of the pain: Aching throbbing ? ?Persistence of the pain: Present all the time ? ?Modifying factors: Denies setbacks but relates increased activity causes increased pain ? ?Dysuria - Plan: POCT urinalysis dipstick, Urine Culture ? ?Primary hypertension - Plan: Hepatic function panel ? ?Hyperlipidemia associated with type 2 diabetes mellitus ([place]) -  Plan: Lipid panel, Hepatic function panel ? ?Encounter for chronic pain management ? ?Type 2 diabetes mellitus with stage 2 chronic kidney disease, without long-term current use of insulin ([place]) - Plan: Hemoglobin I2L, Basic metabolic panel, Hepatic function panel ? ?High risk medication use - Plan: Hepatic function panel ? ?  ? ? ? ? ?Patient thinks she has a UTI. Has gone to Urgent Care for this but can't seem to get rid of it. ?She denies high fever chills sweats ? ?Review of Systems ? ?   ?Objective:  ? Physical Exam ? ?General-in no acute distress ?Eyes-no discharge ?Lungs-respiratory rate normal, CTA ?CV-no murmurs,RRR ?Extremities skin warm dry no edema ?Neuro grossly normal ?Behavior normal, alert ? ?Diabetic foot exam patient does have neuropathy to monofilament no ulcers are seen mild deformity ?No ulcers but does have preulcerative calluses ?   ?Assessment & Plan:  ?1. Dysuria ?Send for culture treat with Omnicef ?- POCT urinalysis dipstick ?- Urine Culture ? ?2. Primary hypertension ?Blood pressure good control continue current measures ?- Hepatic function panel ? ?3. Hyperlipidemia associated with type 2 diabetes mellitus ([place]) ?Healthy diet continue current medications ?- Lipid panel ?- Hepatic function panel ? ?4. Encounter for chronic pain management ?The patient was seen in followup for chronic pain. ?A review over at their current pain status was discussed. Drug registry was checked. ?Prescriptions were given.  Regular follow-up recommended. ?Discussion was held regarding the importance of compliance with medication as well as pain medication  contract. ? ?Patient was informed that medication may cause drowsiness and should not be combined  with other medications/alcohol or street drugs. If the patient feels medication is causing altered alertness then do not drive or operate dangerous equipment. ? ?Should be noted that the patient appears to be meeting appropriate use of opioids and response.   Evidenced by improved function and decent pain control without significant side effects and no evidence of overt aberrancy issues.  Upon discussion with the patient today they understand that opioid therapy is optional and they feel that the pain has been refractory to reasonable conservative measures and is significant and affecting quality of life enough to warrant ongoing therapy and wishes to continue opioids.  Refills were provided. ? ? ?5. Type 2 diabetes mellitus with stage 2 chronic kidney disease, without long-term current use of insulin ([place]) ?Hopefully A1c is under good control check labs ?- Hemoglobin R6E ?- Basic metabolic panel ?- Hepatic function panel ? ?6. High risk medication use ?See above ?- Hepatic function panel ? ?Diabetic neuropathy consistent with diabetes with some foot pain neuropathy and deformity ?

## 2022-02-18 LAB — URINE CULTURE

## 2022-02-18 LAB — SPECIMEN STATUS REPORT

## 2022-02-18 NOTE — Progress Notes (Signed)
02/18/22-script written out for diabetic shoes and placed on provider door. Office note printed out and placed at nurses station.  ?

## 2022-02-20 ENCOUNTER — Other Ambulatory Visit: Payer: Self-pay | Admitting: Family Medicine

## 2022-02-20 MED ORDER — NITROFURANTOIN MONOHYD MACRO 100 MG PO CAPS: ORAL_CAPSULE | ORAL | 0 refills | Status: DC

## 2022-02-22 ENCOUNTER — Ambulatory Visit: Payer: Medicare Other | Admitting: Physician Assistant

## 2022-03-14 ENCOUNTER — Other Ambulatory Visit: Payer: Self-pay | Admitting: Nurse Practitioner

## 2022-03-14 ENCOUNTER — Other Ambulatory Visit: Payer: Self-pay | Admitting: Family Medicine

## 2022-03-14 DIAGNOSIS — J189 Pneumonia, unspecified organism: Secondary | ICD-10-CM

## 2022-03-30 DIAGNOSIS — J988 Other specified respiratory disorders: Secondary | ICD-10-CM | POA: Diagnosis not present

## 2022-03-30 DIAGNOSIS — B9689 Other specified bacterial agents as the cause of diseases classified elsewhere: Secondary | ICD-10-CM | POA: Diagnosis not present

## 2022-04-03 LAB — HM DIABETES EYE EXAM

## 2022-04-05 ENCOUNTER — Ambulatory Visit (INDEPENDENT_AMBULATORY_CARE_PROVIDER_SITE_OTHER): Payer: Medicare Other | Admitting: Family Medicine

## 2022-04-05 ENCOUNTER — Encounter: Payer: Self-pay | Admitting: Family Medicine

## 2022-04-05 VITALS — BP 140/83 | HR 91 | Temp 98.0°F | Ht 63.0 in | Wt 159.0 lb

## 2022-04-05 DIAGNOSIS — R600 Localized edema: Secondary | ICD-10-CM | POA: Diagnosis not present

## 2022-04-05 DIAGNOSIS — E1122 Type 2 diabetes mellitus with diabetic chronic kidney disease: Secondary | ICD-10-CM

## 2022-04-05 DIAGNOSIS — Z79899 Other long term (current) drug therapy: Secondary | ICD-10-CM | POA: Diagnosis not present

## 2022-04-05 DIAGNOSIS — E1169 Type 2 diabetes mellitus with other specified complication: Secondary | ICD-10-CM | POA: Diagnosis not present

## 2022-04-05 DIAGNOSIS — J019 Acute sinusitis, unspecified: Secondary | ICD-10-CM

## 2022-04-05 DIAGNOSIS — R0609 Other forms of dyspnea: Secondary | ICD-10-CM | POA: Diagnosis not present

## 2022-04-05 DIAGNOSIS — E785 Hyperlipidemia, unspecified: Secondary | ICD-10-CM | POA: Diagnosis not present

## 2022-04-05 DIAGNOSIS — N182 Chronic kidney disease, stage 2 (mild): Secondary | ICD-10-CM | POA: Diagnosis not present

## 2022-04-05 MED ORDER — CEFDINIR 300 MG PO CAPS: 300.0000 mg | ORAL_CAPSULE | Freq: Two times a day (BID) | ORAL | 0 refills | Status: DC

## 2022-04-05 MED ORDER — FUROSEMIDE 20 MG PO TABS: ORAL_TABLET | ORAL | 1 refills | Status: DC

## 2022-04-05 NOTE — Progress Notes (Unsigned)
   Subjective:    Patient ID: Elizabeth Aguilar, female    DOB: 09/25/1941, 81 y.o.   MRN: [id number]  HPI Runny nose, cough and scratchy throat ,nausea and possible low grade at be[place]ing of symptoms   grandchild had similar symptoms weeks ago Going on since Saturday , had UC visit saturday  Review of Systems     Objective:   Physical Exam  Gen-NAD not toxic TMS-normal bilateral T- normal no redness Chest-CTA respiratory rate normal no crackles CV RRR no murmur Skin-warm dry Neuro-grossly normal       Assessment & Plan:  1. DOE (dyspnea on exertion) Patient states over the past month or so she has noticed getting out of breath with doing things she is also noted some swelling in her feet on today's exam lungs sound clear she is not tachypneic O2 saturation good We will bump up the dose of the Lasix until the swelling in her legs go down in her feet that is.  Then reduce it to just 1 daily patient has follow-up in June Check BNP - B Nat Peptide - Hemoglobin A1c - Lipid panel - Hepatic function panel - Basic metabolic panel  2. Pedal edema Swelling is just in the feet I really doubt overt heart failure Recommend Lasix 20 mg 2 each morning until the swelling is better then go to 1 each morning Patient to do blood work next week - B Nat Peptide - Hemoglobin A1c - Lipid panel - Hepatic function panel - Basic metabolic panel  3. Acute rhinosinusitis Antibiotic prescribed Omnicef for 7 days follow-up if ongoing troubles - B Nat Peptide - Hemoglobin A1c - Lipid panel - Hepatic function panel - Basic metabolic panel  4. Type 2 diabetes mellitus with stage 2 chronic kidney disease, without long-term current use of insulin (H[facility]) Continue current measures check A1c - B Nat Peptide - Hemoglobin A1c - Lipid panel - Hepatic function panel - Basic metabolic panel  5. High risk medication use Check liver function - B Nat Peptide - Hemoglobin A1c - Lipid panel - Hepatic  function panel - Basic metabolic panel  6. Hyperlipidemia associated with type 2 diabetes mellitus (HCC) Check cholesterol - B Nat Peptide - Hemoglobin A1c - Lipid panel - Hepatic function panel - Basic metabolic panel

## 2022-04-11 ENCOUNTER — Ambulatory Visit (HOSPITAL_COMMUNITY)
Admission: RE | Admit: 2022-04-11 | Discharge: 2022-04-11 | Disposition: A | Discharge status: Home / Self Care | Payer: Medicare Other | Source: Ambulatory Visit | Attending: Family Medicine | Admitting: Family Medicine

## 2022-04-11 DIAGNOSIS — M8589 Other specified disorders of bone density and structure, multiple sites: Secondary | ICD-10-CM | POA: Diagnosis not present

## 2022-04-11 DIAGNOSIS — M85832 Other specified disorders of bone density and structure, left forearm: Secondary | ICD-10-CM | POA: Diagnosis not present

## 2022-04-11 DIAGNOSIS — Z780 Asymptomatic menopausal state: Secondary | ICD-10-CM | POA: Diagnosis not present

## 2022-04-11 DIAGNOSIS — M810 Age-related osteoporosis without current pathological fracture: Secondary | ICD-10-CM | POA: Diagnosis not present

## 2022-04-12 MED ORDER — ALENDRONATE SODIUM 70 MG PO TABS: 70.0000 mg | ORAL_TABLET | ORAL | 5 refills | Status: DC

## 2022-04-12 NOTE — Addendum Note (Signed)
Addended by: Dairl Ponder on: 04/12/2022 11:55 AM   Modules accepted: Orders

## 2022-04-14 ENCOUNTER — Other Ambulatory Visit: Payer: Self-pay | Admitting: Nurse Practitioner

## 2022-04-14 ENCOUNTER — Other Ambulatory Visit: Payer: Self-pay | Admitting: Family Medicine

## 2022-04-14 ENCOUNTER — Encounter: Payer: Self-pay | Admitting: Family Medicine

## 2022-04-14 DIAGNOSIS — M810 Age-related osteoporosis without current pathological fracture: Secondary | ICD-10-CM | POA: Insufficient documentation

## 2022-04-14 HISTORY — DX: Age-related osteoporosis without current pathological fracture: M81.0

## 2022-04-15 ENCOUNTER — Ambulatory Visit: Payer: Medicare Other | Admitting: Cardiovascular Disease

## 2022-04-19 ENCOUNTER — Other Ambulatory Visit: Payer: Self-pay

## 2022-04-19 ENCOUNTER — Emergency Department (HOSPITAL_COMMUNITY)
Admission: EM | Admit: 2022-04-19 | Discharge: 2022-04-19 | Disposition: A | Discharge status: Home / Self Care | Payer: Medicare Other | Attending: Emergency Medicine | Admitting: Emergency Medicine

## 2022-04-19 ENCOUNTER — Telehealth: Payer: Self-pay

## 2022-04-19 ENCOUNTER — Emergency Department (HOSPITAL_COMMUNITY): Payer: Medicare Other

## 2022-04-19 ENCOUNTER — Encounter (HOSPITAL_COMMUNITY): Payer: Self-pay | Admitting: Emergency Medicine

## 2022-04-19 DIAGNOSIS — Z7984 Long term (current) use of oral hypoglycemic drugs: Secondary | ICD-10-CM | POA: Diagnosis not present

## 2022-04-19 DIAGNOSIS — Z7982 Long term (current) use of aspirin: Secondary | ICD-10-CM | POA: Insufficient documentation

## 2022-04-19 DIAGNOSIS — R079 Chest pain, unspecified: Secondary | ICD-10-CM | POA: Diagnosis not present

## 2022-04-19 DIAGNOSIS — Z7901 Long term (current) use of anticoagulants: Secondary | ICD-10-CM | POA: Insufficient documentation

## 2022-04-19 DIAGNOSIS — R0789 Other chest pain: Secondary | ICD-10-CM | POA: Diagnosis not present

## 2022-04-19 DIAGNOSIS — M79605 Pain in left leg: Secondary | ICD-10-CM | POA: Insufficient documentation

## 2022-04-19 LAB — CBC WITH DIFFERENTIAL/PLATELET
Abs Immature Granulocytes: 0.03 10*3/uL (ref 0.00–0.07)
Basophils Absolute: 0.1 10*3/uL (ref 0.0–0.1)
Basophils Relative: 1 %
Eosinophils Absolute: 0.2 10*3/uL (ref 0.0–0.5)
Eosinophils Relative: 2 %
HCT: 41.3 % (ref 36.0–46.0)
Hemoglobin: 13 g/dL (ref 12.0–15.0)
Immature Granulocytes: 0 %
Lymphocytes Relative: 30 %
Lymphs Abs: 2.7 10*3/uL (ref 0.7–4.0)
MCH: 28.1 pg (ref 26.0–34.0)
MCHC: 31.5 g/dL (ref 30.0–36.0)
MCV: 89.4 fL (ref 80.0–100.0)
Monocytes Absolute: 0.6 10*3/uL (ref 0.1–1.0)
Monocytes Relative: 7 %
Neutro Abs: 5.3 10*3/uL (ref 1.7–7.7)
Neutrophils Relative %: 60 %
Platelets: 339 10*3/uL (ref 150–400)
RBC: 4.62 MIL/uL (ref 3.87–5.11)
RDW: 14.7 % (ref 11.5–15.5)
WBC: 8.8 10*3/uL (ref 4.0–10.5)
nRBC: 0 % (ref 0.0–0.2)

## 2022-04-19 LAB — BASIC METABOLIC PANEL
Anion gap: 8 (ref 5–15)
BUN: 18 mg/dL (ref 8–23)
CO2: 28 mmol/L (ref 22–32)
Calcium: 9.2 mg/dL (ref 8.9–10.3)
Chloride: 104 mmol/L (ref 98–111)
Creatinine, Ser: 1.26 mg/dL — ABNORMAL HIGH (ref 0.44–1.00)
GFR, Estimated: 43 mL/min — ABNORMAL LOW (ref >60)
Glucose, Bld: 127 mg/dL — ABNORMAL HIGH (ref 70–99)
Potassium: 3.9 mmol/L (ref 3.5–5.1)
Sodium: 140 mmol/L (ref 135–145)

## 2022-04-19 LAB — TROPONIN I (HIGH SENSITIVITY): Troponin I (High Sensitivity): 6 ng/L (ref <18)

## 2022-04-19 LAB — D-DIMER, QUANTITATIVE: D-Dimer, Quant: 0.42 ug/mL-FEU (ref 0.00–0.50)

## 2022-04-19 NOTE — ED Provider Notes (Signed)
[facility] Provider Note   CSN: [id number] Arrival date & time: 04/19/22  3710     History  Chief Complaint  Patient presents with   Leg Pain    Elizabeth Aguilar is a 81 y.o. female.  Patient presents to ER chief complaint of left thigh inner aspect tenderness.  Symptoms ongoing for the past 2 days now no associated trauma per patient.  No fall no fever no cough no vomiting.  Denies chest pain or abdominal pain.  No shortness of breath reported.       Home Medications Prior to Admission medications   Medication Sig Start Date End Date Taking? Authorizing Provider  cetirizine (ZYRTEC) 10 MG tablet TAKE ONE (1) TABLET BY MOUTH EVERY DAY 04/15/22   Kathyrn Drown, MD  albuterol (VENTOLIN HFA) 108 (90 Base) MCG/ACT inhaler USE 2 PUFFS EVERY 6 HOURS AS NEEDED 03/15/22   Kathyrn Drown, MD  alendronate (FOSAMAX) 70 MG tablet Take 1 tablet (70 mg total) by mouth once a week. Take on an empty stomach with a tall glass of water always remain upright after taking May have breakfast 60 minutes later 04/12/22   Kathyrn Drown, MD  ALPRAZolam Duanne Moron) 0.5 MG tablet TAKE 1/2 TABLET DURING THE DAY & TAKE 1 TABLET AT BEDTIME AS NEEDED 02/14/22   Kathyrn Drown, MD  amLODipine (NORVASC) 2.5 MG tablet TAKE ONE (1) TABLET EACH DAY 10/30/21   Kathyrn Drown, MD  aspirin EC 81 MG EC tablet Take 1 tablet (81 mg total) by mouth daily. 12/20/13   Eileen Stanford, PA-C  atorvastatin (LIPITOR) 40 MG tablet TAKE 1 TABLET DAILY AT [facility] 02/11/22   Troy Sine, MD  cefdinir (OMNICEF) 300 MG capsule Take 1 capsule (300 mg total) by mouth 2 (two) times daily. 04/05/22   Kathyrn Drown, MD  DULoxetine (CYMBALTA) 20 MG capsule Take 20 mg by mouth daily. 07/10/21   [provider]  esomeprazole (NEXIUM) 40 MG capsule 1 qd 11/13/21   Kathyrn Drown, MD  fluticasone (FLONASE) 50 MCG/ACT nasal spray Place 2 sprays into both nostrils daily. 08/13/21   Kathyrn Drown, MD  furosemide (LASIX) 20  MG tablet 2 qam till swelling goes down then 1 qam 04/05/22   Luking, Elayne Snare, MD  gabapentin (NEURONTIN) 100 MG capsule TAKE ONE CAPSULE BY MOUTH AT BEDTIME 04/15/22   Kathyrn Drown, MD  HYDROcodone-acetaminophen (NORCO/VICODIN) 5-325 MG tablet 1 tablet BID prn pain 02/14/22   Kathyrn Drown, MD  HYDROcodone-acetaminophen (NORCO/VICODIN) 5-325 MG tablet TAKE 1 BID PRN PAIN 02/14/22   Kathyrn Drown, MD  HYDROcodone-acetaminophen (NORCO/VICODIN) 5-325 MG tablet Take one tablet BID prn pain 02/14/22   Kathyrn Drown, MD  ketoconazole (NIZORAL) 2 % cream APPLY TWICE DAILY 03/15/22   Kathyrn Drown, MD  loratadine (CLARITIN) 10 MG tablet TAKE ONE (1) TABLET EACH DAY 10/11/21   Kathyrn Drown, MD  Magnesium Oxide 400 MG CAPS Take 1 capsule (400 mg total) by mouth daily. 07/01/19   Lendon Colonel, NP  metFORMIN (GLUCOPHAGE) 500 MG tablet Take 1 tablet (500 mg total) by mouth 2 (two) times daily. 11/13/21   Kathyrn Drown, MD  metoprolol succinate (TOPROL-XL) 50 MG 24 hr tablet Take 1 tablet (50 mg total) by mouth in the morning and at bedtime. with food 01/24/22   [name], PA  Multiple Vitamin (MULTIVITAMIN WITH MINERALS) TABS tablet Take 1 tablet by mouth daily.  [provider]  nitrofurantoin, macrocrystal-monohydrate, (MACROBID) 100 MG capsule Take one capsule po BID for 5 days 02/20/22   Kathyrn Drown, MD  nitroGLYCERIN (NITROSTAT) 0.4 MG SL tablet Place 1 tablet (0.4 mg total) under the tongue every 5 (five) minutes x 3 doses as needed for chest pain. 06/02/17   Mikey Kirschner, MD  ondansetron (ZOFRAN) 8 MG tablet TAKE ONE TABLET EVERY 8 HOURS AS NEEDED 10/11/21   Kathyrn Drown, MD  triamcinolone (KENALOG) 0.1 % Apply small amount twice a day for 10 days 01/03/21   Kathyrn Drown, MD      Allergies    Augmentin [amoxicillin-pot clavulanate], Ceftin [cefuroxime axetil], Darvocet [propoxyphene n-acetaminophen], Doxycycline, Erythromycin, Levofloxacin, Sulfa antibiotics,  Sulfamethoxazole, Vioxx [rofecoxib], Zetia [ezetimibe], Zithromax [azithromycin], and Zocor [simvastatin]    Review of Systems   Review of Systems  Constitutional:  Negative for fever.  HENT:  Negative for ear pain.   Eyes:  Negative for pain.  Respiratory:  Negative for cough.   Cardiovascular:  Negative for chest pain.  Gastrointestinal:  Negative for abdominal pain.  Genitourinary:  Negative for flank pain.  Musculoskeletal:  Negative for back pain.  Skin:  Negative for rash.  Neurological:  Negative for headaches.    Physical Exam Updated Vital Signs BP (!) 169/70   Pulse 66   Temp 98.2 F (36.8 C) (Oral)   Resp 15   Ht '5\' 3"'$  (1.6 m)   Wt 72 kg   SpO2 95%   BMI 28.12 kg/m  Physical Exam Constitutional:      General: She is not in acute distress.    Appearance: Normal appearance.  HENT:     Head: Normocephalic.     Nose: Nose normal.  Eyes:     Extraocular Movements: Extraocular movements intact.  Cardiovascular:     Rate and Rhythm: Normal rate.  Pulmonary:     Effort: Pulmonary effort is normal.  Musculoskeletal:        General: Normal range of motion.     Cervical back: Normal range of motion.     Comments: No swelling or abnormality or tenderness or erythema noted bilateral lower extremities.  Distal dorsalis pulses are 2+ and intact bilaterally.  Compartments are soft.  Normal range of motion bilateral lower extremities.  Neurological:     General: No focal deficit present.     Mental Status: She is alert. Mental status is at baseline.     ED Results / Procedures / Treatments   Labs (all labs ordered are listed, but only abnormal results are displayed) Labs Reviewed  BASIC METABOLIC PANEL - Abnormal; Notable for the following components:      Result Value   Glucose, Bld 127 (*)    Creatinine, Ser 1.26 (*)    GFR, Estimated 43 (*)    All other components within normal limits  CBC WITH DIFFERENTIAL/PLATELET  D-DIMER, QUANTITATIVE  TROPONIN I (HIGH  SENSITIVITY)    EKG EKG Interpretation  Date/Time:  Friday April 19 2022 22:33:48 EDT Ventricular Rate:  78 PR Interval:  154 QRS Duration: 81 QT Interval:  420 QTC Calculation: 479 R Axis:   75 Text Interpretation: Sinus rhythm Anteroseptal infarct, age indeterminate Confirmed by Thamas Jaegers ([id number]) on 04/19/2022 10:51:57 PM  Radiology DG Chest Port 1 View  Result Date: 04/19/2022 CLINICAL DATA:  Chest pain. EXAM: PORTABLE CHEST 1 VIEW COMPARISON:  Chest x-ray 12/18/2017 FINDINGS: The heart size and mediastinal contours are within normal limits. There is a  nodular density in the right upper lung measuring 6 mm, new from prior. There is also questionable nodular density in the right lower lung measuring 6 mm. The lungs are otherwise clear. No pleural effusion or pneumothorax. The visualized skeletal structures are unremarkable. IMPRESSION: 1. No evidence for pneumonia or edema. 2. Questionable small nodular densities in the right lung. Recommend follow-up nonemergent chest CT. Electronically Signed   By: Ronney Asters M.D.   On: 04/19/2022 23:11    Procedures Procedures    Medications Ordered in ED Medications - No data to display  ED Course/ Medical Decision Making/ A&P                           Medical Decision Making Amount and/or Complexity of Data Reviewed Labs: ordered. Radiology: ordered.   Chart review shows office visit for pedal edema shortness of breath Apr 05, 2022.  Cardiac monitoring showing sinus rhythm.  Simona Huh studies included labs CBC CMP which were unremarkable D-dimer is 0.42 in normal range.  Patient had no chest pain as chief complaints, however while she was here she had sudden onset left-sided chest pain that lasted about 1 minute and then resolved.  Onset was spontaneous and resolution with spontaneous.  Etiology unclear.  Chest x-ray ordered concerning for possible right-sided nodule, EKG is unremarkable otherwise.  Patient remains asymptomatic without  any recurrence of her chest pain.  Given brevity of symptoms I doubt acute coronary syndrome or PE or Aortic syndrome.  Recommending outpatient ultrasound of left lower extremity, given instructions return this weekend to have the study done.  Will not start her empirically on Eliquis given normal D-dimer.  Advising immediate return for recurrence or chest pain or any additional concerns.        Final Clinical Impression(s) / ED Diagnoses Final diagnoses:  Left leg pain    Rx / DC Orders ED Discharge Orders     None         Luna Fuse, MD 04/19/22 2333

## 2022-04-19 NOTE — Discharge Instructions (Addendum)
Please call the listed phone number tomorrow morning to schedule the ultrasound for your leg.  Remainder of your labs were unremarkable.  I advise close follow-up with your primary care doctor this week.  Advised immediate return if you have recurrent chest pain fevers difficulty breathing or any additional concerns.

## 2022-04-19 NOTE — ED Triage Notes (Signed)
Pt c/o left thigh pain since last night.

## 2022-04-19 NOTE — Telephone Encounter (Signed)
Caller name:Wylene Simmer   On DPR? :Yes  Call back number:[phone]  Provider they see: [place]   Reason for call:Pt wanted to talk to a nurse she is calling Dr Nicki Reaper put her on a fluid pill and she is having cramps in her leg that she can not stand the pain

## 2022-04-19 NOTE — ED Notes (Signed)
Patient called stating that she was having serve chest pain,went in room to check on patient. Patient was gripping chest. Placed patient on 12 lead and made EDP Dr Almyra Free aware.  EDP at bedside. RN made aware also. EKG done and seen by EDP

## 2022-04-19 NOTE — Telephone Encounter (Signed)
Pt contacted and verbalized understanding. Pt states she will go to ED as soon as she can (pt currently babysitting)

## 2022-04-19 NOTE — Telephone Encounter (Signed)
Pt contacted. Pt states she is taking one Furosemide daily. Has not labs done at this time (advised pt to get labs done early next week). Pt states her leg is painful from the knee up on the inside. Pt states she will got to ER if they keep hurting. Please advise. Thank you

## 2022-04-19 NOTE — Telephone Encounter (Signed)
Please advise. Thank you

## 2022-04-24 ENCOUNTER — Telehealth: Payer: Self-pay | Admitting: Family Medicine

## 2022-04-24 NOTE — Telephone Encounter (Signed)
Left message for patient to call back and schedule Medicare Annual Wellness Visit (AWV) in office.   If unable to come into the office for AWV,  please offer to do virtually or by telephone.  Last AWV: 04/10/2021  Please schedule at anytime with [facility]Nurse Health Advisor.  30 minute appointment for Virtual or phone  45 minute appointment for in office or Initial virtual/phone  Any questions, please contact me at [phone]

## 2022-04-25 ENCOUNTER — Ambulatory Visit (HOSPITAL_COMMUNITY)
Admission: RE | Admit: 2022-04-25 | Discharge: 2022-04-25 | Disposition: A | Discharge status: Home / Self Care | Payer: Medicare Other | Source: Ambulatory Visit | Attending: Emergency Medicine | Admitting: Emergency Medicine

## 2022-04-25 DIAGNOSIS — M79605 Pain in left leg: Secondary | ICD-10-CM | POA: Diagnosis not present

## 2022-04-25 DIAGNOSIS — M79652 Pain in left thigh: Secondary | ICD-10-CM | POA: Diagnosis not present

## 2022-05-08 ENCOUNTER — Ambulatory Visit (INDEPENDENT_AMBULATORY_CARE_PROVIDER_SITE_OTHER): Payer: Medicare Other | Admitting: Family Medicine

## 2022-05-08 VITALS — BP 116/78 | HR 77 | Temp 97.2°F | Ht 63.0 in | Wt 161.0 lb

## 2022-05-08 DIAGNOSIS — E1169 Type 2 diabetes mellitus with other specified complication: Secondary | ICD-10-CM | POA: Diagnosis not present

## 2022-05-08 DIAGNOSIS — N289 Disorder of kidney and ureter, unspecified: Secondary | ICD-10-CM

## 2022-05-08 DIAGNOSIS — G8929 Other chronic pain: Secondary | ICD-10-CM

## 2022-05-08 DIAGNOSIS — M5440 Lumbago with sciatica, unspecified side: Secondary | ICD-10-CM | POA: Diagnosis not present

## 2022-05-08 DIAGNOSIS — E785 Hyperlipidemia, unspecified: Secondary | ICD-10-CM | POA: Diagnosis not present

## 2022-05-08 DIAGNOSIS — R252 Cramp and spasm: Secondary | ICD-10-CM | POA: Diagnosis not present

## 2022-05-08 DIAGNOSIS — F419 Anxiety disorder, unspecified: Secondary | ICD-10-CM

## 2022-05-08 DIAGNOSIS — I10 Essential (primary) hypertension: Secondary | ICD-10-CM | POA: Diagnosis not present

## 2022-05-08 DIAGNOSIS — E1142 Type 2 diabetes mellitus with diabetic polyneuropathy: Secondary | ICD-10-CM | POA: Diagnosis not present

## 2022-05-08 MED ORDER — HYDROCODONE-ACETAMINOPHEN 5-325 MG PO TABS: ORAL_TABLET | ORAL | 0 refills | Status: DC

## 2022-05-08 MED ORDER — METFORMIN HCL 500 MG PO TABS
500.0000 mg | ORAL_TABLET | Freq: Two times a day (BID) | ORAL | 1 refills | Status: DC
Start: 2022-05-08 — End: 2022-10-10

## 2022-05-08 MED ORDER — AMLODIPINE BESYLATE 2.5 MG PO TABS: ORAL_TABLET | ORAL | 5 refills | Status: DC

## 2022-05-08 MED ORDER — GABAPENTIN 100 MG PO CAPS: ORAL_CAPSULE | ORAL | 5 refills | Status: DC

## 2022-05-08 MED ORDER — ALPRAZOLAM 0.5 MG PO TABS
ORAL_TABLET | ORAL | 4 refills | Status: DC
Start: 2022-05-08 — End: 2022-08-08
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
## 2022-05-08 NOTE — Patient Instructions (Signed)

## 2022-05-09 LAB — BASIC METABOLIC PANEL
BUN/Creatinine Ratio: 14 (ref 12–28)
BUN: 14 mg/dL (ref 8–27)
CO2: 25 mmol/L (ref 20–29)
Calcium: 9.5 mg/dL (ref 8.7–10.3)
Chloride: 104 mmol/L (ref 96–106)
Creatinine, Ser: 1.03 mg/dL — ABNORMAL HIGH (ref 0.57–1.00)
Glucose: 123 mg/dL — ABNORMAL HIGH (ref 70–99)
Potassium: 4.5 mmol/L (ref 3.5–5.2)
Sodium: 145 mmol/L — ABNORMAL HIGH (ref 134–144)
eGFR: 55 mL/min/{1.73_m2} — ABNORMAL LOW (ref >59)

## 2022-05-09 LAB — LIPID PANEL
Chol/HDL Ratio: 2.7 ratio (ref 0.0–4.4)
Cholesterol, Total: 181 mg/dL (ref 100–199)
HDL: 67 mg/dL (ref >39)
LDL Chol Calc (NIH): 97 mg/dL (ref 0–99)
Triglycerides: 94 mg/dL (ref 0–149)
VLDL Cholesterol Cal: 17 mg/dL (ref 5–40)

## 2022-05-09 LAB — HEMOGLOBIN A1C
Est. average glucose Bld gHb Est-mCnc: 169 mg/dL
Hgb A1c MFr Bld: 7.5 % — ABNORMAL HIGH (ref 4.8–5.6)

## 2022-05-12 ENCOUNTER — Other Ambulatory Visit: Payer: Self-pay | Admitting: Family Medicine

## 2022-05-21 ENCOUNTER — Telehealth: Payer: Self-pay | Admitting: *Deleted

## 2022-05-21 NOTE — Telephone Encounter (Signed)
Patient says she received a letter stating that her PCP should review the chest x-ray that was done on 04/19/22 when she had went to the ER for left leg pain.  Please advise. Thank you

## 2022-05-21 NOTE — Telephone Encounter (Signed)
Nurses Let the patient know I was able to find her chest x-ray and look at it.  It does have some ill-defined nodular densities in the right lung.  This needs a noncontrast CT scan of the chest.  I recommend setting up the CT scan of the chest because of abnormal chest x-ray along with setting up a follow-up office visit within 1 week of the CAT scan to discuss her results.  Please let the patient know that these findings are not unusual but needs further delineation with a CAT scan to know whether or not they are anything to be concerned about.(Nonetheless very important to set up CT scan and follow-up visit please do so)

## 2022-05-22 ENCOUNTER — Other Ambulatory Visit: Payer: Self-pay

## 2022-05-22 DIAGNOSIS — R9389 Abnormal findings on diagnostic imaging of other specified body structures: Secondary | ICD-10-CM

## 2022-05-22 NOTE — Telephone Encounter (Signed)
Patient has been informed per drs recommendations

## 2022-05-28 ENCOUNTER — Ambulatory Visit (HOSPITAL_COMMUNITY)
Admission: RE | Admit: 2022-05-28 | Discharge: 2022-05-28 | Disposition: A | Discharge status: Home / Self Care | Payer: Medicare Other | Source: Ambulatory Visit | Attending: Family Medicine | Admitting: Family Medicine

## 2022-05-28 DIAGNOSIS — R9389 Abnormal findings on diagnostic imaging of other specified body structures: Secondary | ICD-10-CM | POA: Diagnosis not present

## 2022-05-28 DIAGNOSIS — I2510 Atherosclerotic heart disease of native coronary artery without angina pectoris: Secondary | ICD-10-CM | POA: Diagnosis not present

## 2022-05-28 DIAGNOSIS — J929 Pleural plaque without asbestos: Secondary | ICD-10-CM | POA: Diagnosis not present

## 2022-05-28 DIAGNOSIS — I700 Atherosclerosis of aorta: Secondary | ICD-10-CM | POA: Diagnosis not present

## 2022-05-28 DIAGNOSIS — R918 Other nonspecific abnormal finding of lung field: Secondary | ICD-10-CM | POA: Diagnosis not present

## 2022-06-11 ENCOUNTER — Other Ambulatory Visit: Payer: Self-pay | Admitting: Family Medicine

## 2022-06-11 NOTE — Telephone Encounter (Signed)
The patient should also be on a low-dose potassium that she takes on the days that she takes her diuretic.  I recommend 90-day refill on her furosemide.  Also recommend potassium 10 mill equivalent KCl, 1 daily as needed take with diuretic when using the diuretic, #90

## 2022-07-02 ENCOUNTER — Ambulatory Visit (INDEPENDENT_AMBULATORY_CARE_PROVIDER_SITE_OTHER): Payer: Medicare Other | Admitting: Family Medicine

## 2022-07-02 VITALS — BP 120/70 | Ht 63.0 in | Wt 162.6 lb

## 2022-07-02 DIAGNOSIS — M545 Low back pain, unspecified: Secondary | ICD-10-CM

## 2022-07-02 DIAGNOSIS — R300 Dysuria: Secondary | ICD-10-CM

## 2022-07-02 LAB — POCT URINALYSIS DIPSTICK
Spec Grav, UA: 1.015 (ref 1.010–1.025)
pH, UA: 7 (ref 5.0–8.0)

## 2022-07-02 NOTE — Progress Notes (Signed)
   Subjective:    Patient ID: Elizabeth Aguilar, female    DOB: 1941-06-16, 81 y.o.   MRN: [id number]  HPI  Patient arrives with pain in lower back for a week. Patient states she wonders if she has a UTI. Results for orders placed or performed in visit on 07/02/22  POCT urinalysis dipstick  Result Value Ref Range   Color, UA     Clarity, UA     Glucose, UA     Bilirubin, UA     Ketones, UA     Spec Grav, UA 1.015 1.010 - 1.025   Blood, UA     pH, UA 7.0 5.0 - 8.0   Protein, UA     Urobilinogen, UA     Nitrite, UA     Leukocytes, UA     Appearance     Odor      Review of Systems     Objective:   Physical Exam General-in no acute distress Eyes-no discharge Lungs-respiratory rate normal, CTA CV-no murmurs,RRR Extremities skin warm dry no edema Neuro grossly normal Behavior normal, alert Lower abdomen nontender Lower back subjective tenderness negative straight leg raise       Assessment & Plan:  Lumbar discomfort more likely related to her taking care of her grandchild but it could also be another problem if is not doing better in the next 2 to 3 weeks recommend imaging and lab work-up for now stretches  I find no evidence of UTI urine under the microscope looks good

## 2022-07-04 LAB — SPECIMEN STATUS REPORT

## 2022-07-04 LAB — URINE CULTURE: Organism ID, Bacteria: NO GROWTH

## 2022-07-05 ENCOUNTER — Ambulatory Visit: Payer: Medicare Other | Admitting: Cardiovascular Disease

## 2022-07-08 ENCOUNTER — Ambulatory Visit: Payer: Medicare Other | Attending: Physician Assistant | Admitting: Cardiovascular Disease

## 2022-07-08 ENCOUNTER — Encounter: Payer: Self-pay | Admitting: Cardiovascular Disease

## 2022-07-08 VITALS — BP 152/78 | HR 85 | Ht 63.0 in | Wt 162.2 lb

## 2022-07-08 DIAGNOSIS — Z9861 Coronary angioplasty status: Secondary | ICD-10-CM

## 2022-07-08 DIAGNOSIS — I2510 Atherosclerotic heart disease of native coronary artery without angina pectoris: Secondary | ICD-10-CM | POA: Diagnosis not present

## 2022-07-08 DIAGNOSIS — E119 Type 2 diabetes mellitus without complications: Secondary | ICD-10-CM | POA: Diagnosis not present

## 2022-07-08 DIAGNOSIS — I10 Essential (primary) hypertension: Secondary | ICD-10-CM | POA: Diagnosis not present

## 2022-07-08 DIAGNOSIS — I2102 ST elevation (STEMI) myocardial infarction involving left anterior descending coronary artery: Secondary | ICD-10-CM | POA: Diagnosis not present

## 2022-07-08 DIAGNOSIS — E785 Hyperlipidemia, unspecified: Secondary | ICD-10-CM | POA: Diagnosis not present

## 2022-07-08 MED ORDER — AMLODIPINE BESYLATE 5 MG PO TABS: 5.0000 mg | ORAL_TABLET | Freq: Every day | ORAL | 3 refills | Status: DC

## 2022-07-08 NOTE — Patient Instructions (Signed)
Medication Instructions:   Increase Amlodipine to 5 mg daily  Continue all other medications  *If you need a refill on your cardiac medications before your next appointment, please call your pharmacy*   Lab Work: None ordered   Testing/Procedures: None ordered   Follow-Up: At [facility], you and your health needs are our priority.  As part of our continuing mission to provide you with exceptional heart care, we have created designated Provider Care Teams.  These Care Teams include your primary Cardiologist (physician) and Advanced Practice Providers (APPs -  Physician Assistants and Nurse Practitioners) who all work together to provide you with the care you need, when you need it.  We recommend signing up for the patient portal called "MyChart".  Sign up information is provided on this After Visit Summary.  MyChart is used to connect with patients for Virtual Visits (Telemedicine).  Patients are able to view lab/test results, encounter notes, upcoming appointments, etc.  Non-urgent messages can be sent to your provider as well.   To learn more about what you can do with MyChart, go to [email or web address].    Your next appointment:  Call in Jan to schedule March appointment     The format for your next appointment: Office   Provider: [facility]    Important Information About Sugar
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
## 2022-07-08 NOTE — Addendum Note (Signed)
Addended by: Kathyrn Lass on: 07/08/2022 01:58 PM   Modules accepted: Orders

## 2022-07-30 ENCOUNTER — Ambulatory Visit (INDEPENDENT_AMBULATORY_CARE_PROVIDER_SITE_OTHER): Payer: Medicare Other | Admitting: Family Medicine

## 2022-07-30 VITALS — BP 128/56 | Temp 98.4°F | Ht 63.0 in | Wt 160.0 lb

## 2022-07-30 DIAGNOSIS — R300 Dysuria: Secondary | ICD-10-CM | POA: Diagnosis not present

## 2022-07-30 DIAGNOSIS — R531 Weakness: Secondary | ICD-10-CM

## 2022-07-30 DIAGNOSIS — J069 Acute upper respiratory infection, unspecified: Secondary | ICD-10-CM

## 2022-07-30 LAB — POCT URINALYSIS DIP (CLINITEK)
Bilirubin, UA: NEGATIVE
Glucose, UA: NEGATIVE mg/dL
Ketones, POC UA: NEGATIVE mg/dL
Nitrite, UA: POSITIVE — AB
POC PROTEIN,UA: 30 — AB
Spec Grav, UA: 1.01 (ref 1.010–1.025)
Urobilinogen, UA: 0.2 E.U./dL
pH, UA: 6 (ref 5.0–8.0)

## 2022-07-30 MED ORDER — CEFDINIR 300 MG PO CAPS: 300.0000 mg | ORAL_CAPSULE | Freq: Two times a day (BID) | ORAL | 0 refills | Status: DC

## 2022-07-30 NOTE — Progress Notes (Signed)
   Subjective:    Patient ID: Elizabeth Aguilar, female    DOB: 1941-01-21, 81 y.o.   MRN: [id number]  HPI  Fatigue, body aches, balance is off, cough , sore throat , dysuria, low back pain Patient with fatigue tiredness body aches feels drowsy at times other times not as bad finds himself struggling with activity because of the fatigue denies high fever chills or sweats currently denies vomiting or diarrhea relates intermittent dysuria has frequent UTIs also has sore throat runny nose and a slight cough no wheezing or difficulty breathing Review of Systems     Objective:   Physical Exam Lungs are clear no crackles or rails eardrums are normal throat is normal nares running extremities no edema skin warm dry Urinalysis under the microscope WBCs and bacteria       Assessment & Plan:  1. Dysuria Go ahead with urine culture as well as antibiotics.  Warning signs were discussed in detail go ahead with antibiotics also follow-up if any problems - POCT URINALYSIS DIP (CLINITEK)  2. Weak Weakness is probably related to the infection I am more concerned about the possibility of COVID rather than other issues but nonetheless this needs to be followed closely  3. Upper respiratory tract infection, unspecified type Go ahead with urine treatment but await COVID test warning signs were discussed in detail - Urine Culture - Novel Coronavirus, NAA ([facility]) I do not feel the patient needs any lab work or x-rays,

## 2022-07-31 LAB — NOVEL CORONAVIRUS, NAA: SARS-CoV-2, NAA: DETECTED — AB

## 2022-08-01 ENCOUNTER — Other Ambulatory Visit: Payer: Self-pay | Admitting: Family Medicine

## 2022-08-01 ENCOUNTER — Telehealth: Payer: Self-pay | Admitting: Family Medicine

## 2022-08-01 ENCOUNTER — Other Ambulatory Visit: Payer: Self-pay | Admitting: *Deleted

## 2022-08-01 MED ORDER — NIRMATRELVIR/RITONAVIR (PAXLOVID) TABLET (RENAL DOSING): 2.0000 | ORAL_TABLET | Freq: Two times a day (BID) | ORAL | 0 refills | Status: AC

## 2022-08-01 MED ORDER — NIRMATRELVIR/RITONAVIR (PAXLOVID) TABLET (RENAL DOSING): 2.0000 | ORAL_TABLET | Freq: Two times a day (BID) | ORAL | 0 refills | Status: DC

## 2022-08-01 NOTE — Telephone Encounter (Signed)
Results have been shared with pt via result notes.

## 2022-08-01 NOTE — Telephone Encounter (Signed)
Nurses-see result note.  Result note is copied below.  Patient needs to be notified about this this morning please  Nurses-patient is positive for COVID that is what is making her sick.  Recommend Paxlovid to tablets twice daily for 5 days.  I sent this prescription into the drugstore.  Her pharmacy.  Highly recommended stop atorvastatin while on the medicine.  Also cut amlodipine tablet in half for the 5 days that she is on the medicine.  Educate patient that if she is having difficulty breathing severe dehydration lethargy or other problems to go to the ER.  Also advised the patient to stop her metformin while on this medicine.  If further input is necessary call me on my cell number or talk with Dr. Lacinda Axon.  Thanks-Dr. Nicki Reaper

## 2022-08-02 LAB — URINE CULTURE

## 2022-08-07 ENCOUNTER — Ambulatory Visit (INDEPENDENT_AMBULATORY_CARE_PROVIDER_SITE_OTHER): Payer: Medicare Other | Admitting: Family Medicine

## 2022-08-07 ENCOUNTER — Encounter: Payer: Self-pay | Admitting: Family Medicine

## 2022-08-07 VITALS — BP 130/70 | Wt 159.6 lb

## 2022-08-07 DIAGNOSIS — U099 Post covid-19 condition, unspecified: Secondary | ICD-10-CM | POA: Diagnosis not present

## 2022-08-07 DIAGNOSIS — E785 Hyperlipidemia, unspecified: Secondary | ICD-10-CM | POA: Diagnosis not present

## 2022-08-07 DIAGNOSIS — R7989 Other specified abnormal findings of blood chemistry: Secondary | ICD-10-CM | POA: Diagnosis not present

## 2022-08-07 DIAGNOSIS — E1142 Type 2 diabetes mellitus with diabetic polyneuropathy: Secondary | ICD-10-CM

## 2022-08-07 DIAGNOSIS — I10 Essential (primary) hypertension: Secondary | ICD-10-CM | POA: Diagnosis not present

## 2022-08-07 DIAGNOSIS — E1122 Type 2 diabetes mellitus with diabetic chronic kidney disease: Secondary | ICD-10-CM | POA: Diagnosis not present

## 2022-08-07 DIAGNOSIS — E1169 Type 2 diabetes mellitus with other specified complication: Secondary | ICD-10-CM

## 2022-08-07 DIAGNOSIS — N182 Chronic kidney disease, stage 2 (mild): Secondary | ICD-10-CM

## 2022-08-07 NOTE — Progress Notes (Unsigned)
Subjective:    Patient ID: Elizabeth Aguilar, female    DOB: [birth year], 81 y.o.   MRN: [id number]  Diabetes She presents for her follow-up diabetic visit. She has type 2 diabetes mellitus. There are no hypoglycemic associated symptoms. There are no diabetic associated symptoms. There are no hypoglycemic complications. There are no diabetic complications. Risk factors for coronary artery disease include hypertension. Current diabetic treatment includes oral agent (monotherapy). She does not see a podiatrist.Eye exam is current.  Hypertension This is a chronic problem. Risk factors for coronary artery disease include diabetes mellitus. There are no compliance problems.     Pt states she is still having issues with breathing(recent COVID). Pt states she gets winded with activity.   This patient was seen today for chronic pain  The medication list was reviewed and updated.  Location of Pain for which the patient has been treated with regarding narcotics: States pain is mainly in her back worse with movement  Onset of this pain: Present for years   -Compliance with medication: Good compliance  - Number patient states they take daily: Takes 1 sometimes 2  -when was the last dose patient took?  Yesterday  The patient was advised the importance of maintaining medication and not using illegal substances with these.  Here for refills and follow up  The patient was educated that we can provide 3 monthly scripts for their medication, it is their responsibility to follow the instructions.  Side effects or complications from medications: Denies side effects  Patient is aware that pain medications are meant to minimize the severity of the pain to allow their pain levels to improve to allow for better function. They are aware of that pain medications cannot totally remove their pain.  Due for UDT ( at least once per year) : On next visit  Scale of 1 to 10 ( 1 is least 10 is most) Your pain  level without the medicine: 8 Your pain level with medication 4  Scale 1 to 10 ( 1-helps very little, 10 helps very well) How well does your pain medication reduce your pain so you can function better through out the day? 7  Quality of the pain: Throbbing aching  Persistence of the pain: Present all the time  Modifying factors: Worse with certain movements       Review of Systems     Objective:   Physical Exam  General-in no acute distress Eyes-no discharge Lungs-respiratory rate normal, CTA CV-no murmurs,RRR Extremities skin warm dry no edema Neuro grossly normal Behavior normal, alert       Assessment & Plan:  1. Primary hypertension Blood pressure decent control continue current measures  2. Elevated serum creatinine We will check urine ACR and metabolic 7  3. Hyperlipidemia associated with type 2 diabetes mellitus ([place]) Continue cholesterol medicine recent lipid look good no need to repeat  4. Diabetic peripheral neuropathy ([facility]) Hydrocodone once per day sometimes twice a day  5. Post-COVID syndrome Important to take it easy for the next few days if not dramatically better in the next 2 weeks let us know no sign of pneumonia on today's exam  The patient was seen in followup for chronic pain. A review over at their current pain status was discussed. Drug registry was checked. Prescriptions were given.  Regular follow-up recommended. Discussion was held regarding the importance of compliance with medication as well as pain medication contract.  Patient was informed that medication may cause drowsiness and should not  be combined  with other medications/alcohol or street drugs. If the patient feels medication is causing altered alertness then do not drive or operate dangerous equipment.  Should be noted that the patient appears to be meeting appropriate use of opioids and response.  Evidenced by improved function and decent pain control without significant side  effects and no evidence of overt aberrancy issues.  Upon discussion with the patient today they understand that opioid therapy is optional and they feel that the pain has been refractory to reasonable conservative measures and is significant and affecting quality of life enough to warrant ongoing therapy and wishes to continue opioids.  Refills were provided.

## 2022-08-08 ENCOUNTER — Other Ambulatory Visit: Payer: Self-pay | Admitting: Physician Assistant

## 2022-08-08 ENCOUNTER — Other Ambulatory Visit: Payer: Self-pay | Admitting: Family Medicine

## 2022-08-08 ENCOUNTER — Ambulatory Visit: Payer: Medicare Other | Admitting: Family Medicine

## 2022-08-08 DIAGNOSIS — J189 Pneumonia, unspecified organism: Secondary | ICD-10-CM

## 2022-08-08 MED ORDER — HYDROCODONE-ACETAMINOPHEN 5-325 MG PO TABS
ORAL_TABLET | ORAL | 0 refills | Status: DC
Start: 2022-08-08 — End: 2022-11-06

## 2022-08-08 MED ORDER — HYDROCODONE-ACETAMINOPHEN 5-325 MG PO TABS: ORAL_TABLET | ORAL | 0 refills | Status: DC

## 2022-08-08 MED ORDER — ALPRAZOLAM 0.5 MG PO TABS
ORAL_TABLET | ORAL | 4 refills | Status: DC
Start: 2022-08-08 — End: 2022-11-06

## 2022-08-16 ENCOUNTER — Encounter: Payer: Self-pay | Admitting: *Deleted

## 2022-08-23 DIAGNOSIS — E785 Hyperlipidemia, unspecified: Secondary | ICD-10-CM | POA: Diagnosis not present

## 2022-08-23 DIAGNOSIS — E1169 Type 2 diabetes mellitus with other specified complication: Secondary | ICD-10-CM | POA: Diagnosis not present

## 2022-08-23 DIAGNOSIS — I10 Essential (primary) hypertension: Secondary | ICD-10-CM | POA: Diagnosis not present

## 2022-08-23 DIAGNOSIS — E1142 Type 2 diabetes mellitus with diabetic polyneuropathy: Secondary | ICD-10-CM | POA: Diagnosis not present

## 2022-08-23 DIAGNOSIS — R7989 Other specified abnormal findings of blood chemistry: Secondary | ICD-10-CM | POA: Diagnosis not present

## 2022-08-24 LAB — BASIC METABOLIC PANEL
BUN/Creatinine Ratio: 12 (ref 12–28)
BUN: 13 mg/dL (ref 8–27)
CO2: 26 mmol/L (ref 20–29)
Calcium: 9.4 mg/dL (ref 8.7–10.3)
Chloride: 105 mmol/L (ref 96–106)
Creatinine, Ser: 1.1 mg/dL — ABNORMAL HIGH (ref 0.57–1.00)
Glucose: 130 mg/dL — ABNORMAL HIGH (ref 70–99)
Potassium: 4.5 mmol/L (ref 3.5–5.2)
Sodium: 144 mmol/L (ref 134–144)
eGFR: 50 mL/min/{1.73_m2} — ABNORMAL LOW (ref >59)

## 2022-08-24 LAB — HEMOGLOBIN A1C
Est. average glucose Bld gHb Est-mCnc: 174 mg/dL
Hgb A1c MFr Bld: 7.7 % — ABNORMAL HIGH (ref 4.8–5.6)

## 2022-08-24 LAB — MICROALBUMIN / CREATININE URINE RATIO
Creatinine, Urine: 46.7 mg/dL
Microalb/Creat Ratio: 20 mg/g creat (ref 0–29)
Microalbumin, Urine: 9.2 ug/mL

## 2022-09-08 ENCOUNTER — Other Ambulatory Visit: Payer: Self-pay | Admitting: Family Medicine

## 2022-09-09 ENCOUNTER — Other Ambulatory Visit: Payer: Self-pay | Admitting: Family Medicine

## 2022-10-09 ENCOUNTER — Other Ambulatory Visit: Payer: Self-pay | Admitting: Family Medicine

## 2022-11-06 ENCOUNTER — Ambulatory Visit: Payer: Medicare Other | Admitting: Family Medicine

## 2022-11-06 ENCOUNTER — Encounter: Payer: Self-pay | Admitting: Family Medicine

## 2022-11-06 ENCOUNTER — Ambulatory Visit (INDEPENDENT_AMBULATORY_CARE_PROVIDER_SITE_OTHER): Payer: Medicare Other | Admitting: Family Medicine

## 2022-11-06 VITALS — BP 134/74 | Ht 62.25 in | Wt 157.0 lb

## 2022-11-06 DIAGNOSIS — E1122 Type 2 diabetes mellitus with diabetic chronic kidney disease: Secondary | ICD-10-CM | POA: Diagnosis not present

## 2022-11-06 DIAGNOSIS — G8929 Other chronic pain: Secondary | ICD-10-CM

## 2022-11-06 DIAGNOSIS — N182 Chronic kidney disease, stage 2 (mild): Secondary | ICD-10-CM | POA: Diagnosis not present

## 2022-11-06 DIAGNOSIS — M25511 Pain in right shoulder: Secondary | ICD-10-CM | POA: Diagnosis not present

## 2022-11-06 DIAGNOSIS — N1831 Chronic kidney disease, stage 3a: Secondary | ICD-10-CM | POA: Diagnosis not present

## 2022-11-06 DIAGNOSIS — R300 Dysuria: Secondary | ICD-10-CM

## 2022-11-06 DIAGNOSIS — N289 Disorder of kidney and ureter, unspecified: Secondary | ICD-10-CM

## 2022-11-06 DIAGNOSIS — I10 Essential (primary) hypertension: Secondary | ICD-10-CM | POA: Diagnosis not present

## 2022-11-06 DIAGNOSIS — E1142 Type 2 diabetes mellitus with diabetic polyneuropathy: Secondary | ICD-10-CM | POA: Diagnosis not present

## 2022-11-06 DIAGNOSIS — N3000 Acute cystitis without hematuria: Secondary | ICD-10-CM

## 2022-11-06 LAB — POCT URINALYSIS DIP (CLINITEK)
Nitrite, UA: POSITIVE — AB
POC PROTEIN,UA: 30 — AB
Spec Grav, UA: >=1.03 — AB (ref 1.010–1.025)
pH, UA: 6 (ref 5.0–8.0)

## 2022-11-06 MED ORDER — GABAPENTIN 100 MG PO CAPS: ORAL_CAPSULE | ORAL | 5 refills | Status: DC

## 2022-11-06 MED ORDER — ALPRAZOLAM 0.5 MG PO TABS: ORAL_TABLET | ORAL | 4 refills | Status: DC

## 2022-11-06 MED ORDER — HYDROCODONE-ACETAMINOPHEN 5-325 MG PO TABS: ORAL_TABLET | ORAL | 0 refills | Status: DC

## 2022-11-06 MED ORDER — ALENDRONATE SODIUM 70 MG PO TABS: ORAL_TABLET | ORAL | 6 refills | Status: DC

## 2022-11-06 MED ORDER — CEFDINIR 300 MG PO CAPS: 300.0000 mg | ORAL_CAPSULE | Freq: Two times a day (BID) | ORAL | 0 refills | Status: DC

## 2022-11-06 MED ORDER — METFORMIN HCL 500 MG PO TABS: ORAL_TABLET | ORAL | 1 refills | Status: DC
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
## 2022-11-07 ENCOUNTER — Other Ambulatory Visit: Payer: Self-pay | Admitting: Family Medicine

## 2022-11-08 ENCOUNTER — Ambulatory Visit (INDEPENDENT_AMBULATORY_CARE_PROVIDER_SITE_OTHER): Payer: Medicare Other

## 2022-11-08 VITALS — Ht 62.5 in | Wt 157.0 lb

## 2022-11-08 DIAGNOSIS — Z0000 Encounter for general adult medical examination without abnormal findings: Secondary | ICD-10-CM

## 2022-11-08 LAB — URINE CULTURE

## 2022-11-08 NOTE — Progress Notes (Cosign Needed)
Subjective:   Elizabeth Aguilar is a 81 y.o. female who presents for Medicare Annual (Subsequent) preventive examination.  I connected with  Elizabeth Aguilar on 11/08/22 by a audio enabled telemedicine application and verified that I am speaking with the correct person using two identifiers.  Patient Location: Home  Provider Location: Office/Clinic  I discussed the limitations of evaluation and management by telemedicine. The patient expressed understanding and agreed to proceed.  Review of Systems     Cardiac Risk Factors include: advanced age (>68mn, >>36women);diabetes mellitus;dyslipidemia;hypertension     Objective:    Today's Vitals   11/08/22 1148  Weight: 157 lb (71.2 kg)  Height: 5' 2.5" (1.588 m)   Body mass index is 28.26 kg/m.     11/08/2022   12:02 PM 04/19/2022    7:27 PM 04/10/2021    1:59 PM 10/05/2018    8:30 AM 09/21/2018   11:03 AM 09/17/2018   12:29 PM 12/18/2017    7:54 PM  Advanced Directives  Does Patient Have a Medical Advance Directive? _0  No No  Would patient like information on creating a medical advance directive? Yes (MAU/Ambulatory/Procedural Areas - Information given) No - Patient declined No - Patient declined No - Patient declined No - Patient declined No - Patient declined No - Patient declined    Current Medications (verified) Outpatient Encounter Medications as of 11/08/2022  Medication Sig   albuterol (VENTOLIN HFA) 108 (90 Base) MCG/ACT inhaler USE 2 PUFFS EVERY 6 HOURS AS NEEDED   alendronate (FOSAMAX) 70 MG tablet TAKE 1 TABLET WEEKLY (TAKE WITH 8OZ OF WATER 30 MINUTES BEFORE BREAKFAST)   ALPRAZolam (XANAX) 0.5 MG tablet TAKE 1/2 TABLET DURING THE DAY & TAKE 1 TABLET AT BEDTIME AS NEEDED   amLODipine (NORVASC) 5 MG tablet Take 1 tablet (5 mg total) by mouth daily.   aspirin EC 81 MG EC tablet Take 1 tablet (81 mg total) by mouth daily.   atorvastatin (LIPITOR) 40 MG tablet TAKE 1 TABLET DAILY AT 6PM   cefdinir (OMNICEF) 300  MG capsule Take 1 capsule (300 mg total) by mouth 2 (two) times daily.   cetirizine (ZYRTEC) 10 MG tablet TAKE ONE (1) TABLET BY MOUTH EVERY DAY   esomeprazole (NEXIUM) 40 MG capsule TAKE ONE (1) CAPSULE EACH DAY   ezetimibe (ZETIA) 10 MG tablet TAKE ONE (1) TABLET BY MOUTH EVERY DAY   fluticasone (FLONASE) 50 MCG/ACT nasal spray USE 2 SPRAYS IN EACH NOSTRIL DAILY   furosemide (LASIX) 20 MG tablet 1 qam till swelling goes down   gabapentin (NEURONTIN) 100 MG capsule 2 qhs   HYDROcodone-acetaminophen (NORCO/VICODIN) 5-325 MG tablet 1 pill taken once or twice daily as needed for severe pain   HYDROcodone-acetaminophen (NORCO/VICODIN) 5-325 MG tablet 1 pill taken once or twice daily as needed for pain   HYDROcodone-acetaminophen (NORCO/VICODIN) 5-325 MG tablet 1 tablet BID prn pain   ketoconazole (NIZORAL) 2 % cream APPLY TWICE DAILY   Magnesium Oxide 400 MG CAPS Take 1 capsule (400 mg total) by mouth daily.   metFORMIN (GLUCOPHAGE) 500 MG tablet TAKE ONE (1) TABLET BY MOUTH TWO (2) TIMES DAILY   metoprolol succinate (TOPROL-XL) 50 MG 24 hr tablet TAKE 1 TABLET IN THE MORNING AND AT BEDTIME WITH FOOD   Multiple Vitamin (MULTIVITAMIN WITH MINERALS) TABS tablet Take 1 tablet by mouth daily.   nitroGLYCERIN (NITROSTAT) 0.4 MG SL tablet Place 1 tablet (0.4 mg total) under the tongue every 5 (five) minutes x  3 doses as needed for chest pain.   ondansetron (ZOFRAN) 8 MG tablet TAKE ONE TABLET EVERY 8 HOURS AS NEEDED   No facility-administered encounter medications on file as of 11/08/2022.    Allergies (verified) Augmentin [amoxicillin-pot clavulanate], Ceftin [cefuroxime axetil], Darvocet [propoxyphene n-acetaminophen], Doxycycline, Erythromycin, Levofloxacin, Sulfa antibiotics, Sulfamethoxazole, Vioxx [rofecoxib], Zetia [ezetimibe], Zithromax [azithromycin], and Zocor [simvastatin]   History: Past Medical History:  Diagnosis Date   CAD (coronary artery disease)    a. s/p STEMI in 2015 with DES  to LAD and stgaed PCI with DES to RCA   Chest pain 10/10/2014   Chronic combined systolic and diastolic congestive heart failure ([place])    a. 03/2017: echo showing mildly reduced EF of 40-45% with Grade 1 DD and mild to moderate MR.    Hyperlipidemia    Irritable bowel syndrome    Myocardial infarction ([place])    Osteoporosis 04/14/2022   Detected on bone density, Fosamax started, June 2023   Pericarditis    Type 2 diabetes mellitus [facility])    Past Surgical History:  Procedure Laterality Date   CATARACT EXTRACTION W/PHACO Left 09/21/2018   Procedure: CATARACT EXTRACTION PHACO AND INTRAOCULAR LENS PLACEMENT LEFT EYE;  Surgeon: Tonny Branch, MD;  Location: [facility];  Service: Ophthalmology;  Laterality: Left;  CDE: 14.50   CATARACT EXTRACTION W/PHACO Right 10/05/2018   Procedure: CATARACT EXTRACTION PHACO AND INTRAOCULAR LENS PLACEMENT (IOC);  Surgeon: Tonny Branch, MD;  Location: [facility];  Service: Ophthalmology;  Laterality: Right;  CDE: 11.48   CORONARY ANGIOPLASTY WITH STENT PLACEMENT     LEFT HEART CATHETERIZATION WITH CORONARY ANGIOGRAM N/A 12/16/2013   Procedure: LEFT HEART CATHETERIZATION WITH CORONARY ANGIOGRAM;  Surgeon: Troy Sine, MD;  Location: [facility] CATH LAB;  Service: Cardiovascular;  Laterality: N/A;   LEFT HEART CATHETERIZATION WITH CORONARY ANGIOGRAM  12/17/2013   Procedure: LEFT HEART CATHETERIZATION WITH CORONARY ANGIOGRAM;  Surgeon: Troy Sine, MD;  Location: [facility] CATH LAB;  Service: Cardiovascular;;   PERCUTANEOUS CORONARY STENT INTERVENTION (PCI-S) N/A 12/17/2013   Procedure: PERCUTANEOUS CORONARY STENT INTERVENTION (PCI-S);  Surgeon: Troy Sine, MD;  Location: [facility] CATH LAB;  Service: Cardiovascular;  Laterality: N/A;   Family History  Problem Relation Age of Onset   Diabetes Mother    Diabetes Daughter    Diabetes Son    Social History   Socioeconomic History   Marital status: Widowed    Spouse name: Not on file   Number of children: Not on file   Years of education:  Not on file   Highest education level: Not on file  Occupational History   Not on file  Tobacco Use   Smoking status: Never   Smokeless tobacco: Never  Substance and Sexual Activity   Alcohol use: No   Drug use: No   Sexual activity: Not on file  Other Topics Concern   Not on file  Social History Narrative   Not on file   Social Determinants of Health   Financial Resource Strain: Low Risk  (11/08/2022)   Overall Financial Resource Strain (CARDIA)    Difficulty of Paying Living Expenses: Not hard at all  Food Insecurity: No Food Insecurity (11/08/2022)   Hunger Vital Sign    Worried About Running Out of Food in the Last Year: Never true    Ran Out of Food in the Last Year: Never true  Transportation Needs: No Transportation Needs (11/08/2022)   PRAPARE - Hydrologist (Medical): No  Lack of Transportation (Non-Medical): No  Physical Activity: Inactive (11/08/2022)   Exercise Vital Sign    Days of Exercise per Week: 0 days    Minutes of Exercise per Session: 0 min  Stress: No Stress Concern Present (11/08/2022)   [place]    Feeling of Stress : Not at all  Social Connections: Socially Isolated (11/08/2022)   Social Connection and Isolation Panel [NHANES]    Frequency of Communication with Friends and Family: More than three times a week    Frequency of Social Gatherings with Friends and Family: Three times a week    Attends Religious Services: Never    Active Member of Clubs or Organizations: No    Attends Archivist Meetings: Never    Marital Status: Widowed    Tobacco Counseling Counseling given: Not Answered   Clinical Intake:  Pre-visit preparation completed: Yes  Pain : No/denies pain  Diabetes: Yes CBG done?: No Did pt. bring in CBG monitor from home?: No  How often do you need to have someone help you when you read instructions, pamphlets, or  other written materials from your doctor or pharmacy?: 1 - Never  Diabetic?Yes Nutrition Risk Assessment:  Has the patient had any N/V/D within the last 2 months?  No  Does the patient have any non-healing wounds?  No  Has the patient had any unintentional weight loss or weight gain?  No   Diabetes:  Is the patient diabetic?  Yes  If diabetic, was a CBG obtained today?  No  Did the patient bring in their glucometer from home?  No  How often do you monitor your CBG's? As needed .   Financial Strains and Diabetes Management:  Are you having any financial strains with the device, your supplies or your medication? No .  Does the patient want to be seen by Chronic Care Management for management of their diabetes?  No  Would the patient like to be referred to a Nutritionist or for Diabetic Management?  No   Diabetic Exams:  Diabetic Eye Exam: Completed 04/03/22 Diabetic Foot Exam: Completed 05/08/22   Interpreter Needed?: No  Information entered by :: Denman George LPN   Activities of Daily Living    11/08/2022   12:02 PM  In your present state of health, do you have any difficulty performing the following activities:  Hearing? 0  Vision? 0  Difficulty concentrating or making decisions? 0  Walking or climbing stairs? 1  Comment at times  Dressing or bathing? 0  Doing errands, shopping? 0  Preparing Food and eating ? N  Using the Toilet? N  In the past six months, have you accidently leaked urine? N  Do you have problems with loss of bowel control? N  Managing your Medications? N  Managing your Finances? N  Housekeeping or managing your Housekeeping? N    Patient Care Team: Kathyrn Drown, MD as PCP - General (Family Medicine) Troy Sine, MD as PCP - Cardiology (Cardiology)  Indicate any recent Medical Services you may have received from other than [facility] providers in the past year (date may be approximate).     Assessment:   This is a routine wellness  examination for [facility].  Hearing/Vision screen Hearing Screening - Comments:: Denies hearing difficulties  Vision Screening - Comments:: Wears rx glasses - up to date with routine eye exams with [facility] De[facility] Coder    Dietary issues and exercise activities discussed: Current  Exercise Habits: The patient does not participate in regular exercise at present   Goals Addressed             This Visit's Progress    Prevent falls   On track     Depression Screen    11/08/2022   12:02 PM 08/13/2021    1:16 PM 04/10/2021    2:01 PM 11/14/2020    1:35 PM 01/12/2019    1:10 PM 12/31/2017    2:10 PM 09/30/2017    1:16 PM  PHQ 2/9 Scores  PHQ - 2 Score 0 0 0 0 0 0 0  PHQ- 9 Score       0    Fall Risk    11/08/2022   11:51 AM 08/13/2021    1:16 PM 04/10/2021    2:00 PM 08/01/2020    1:56 PM 01/05/2020    2:00 PM  [place] in the past year? 0 0 _0 Number falls in past yr: 0 0 0 0 1  Injury with Fall? 0 0 0 0 1  Risk for fall due to : No Fall Risks No Fall Risks No Fall Risks History of fall(s) Impaired balance/gait  Follow up Falls evaluation completed;Education provided;Falls prevention discussed Falls evaluation completed Falls evaluation completed;Falls prevention discussed Falls evaluation completed;Education provided Education provided    FALL RISK PREVENTION PERTAINING TO THE HOME:  Any stairs in or around the home? Yes  If so, are there any without handrails? No  Home free of loose throw rugs in walkways, pet beds, electrical cords, etc? Yes  Adequate lighting in your home to reduce risk of falls? Yes   ASSISTIVE DEVICES UTILIZED TO PREVENT FALLS:  Life alert? No  Use of a cane, walker or w/c? Yes  Grab bars in the bathroom? Yes  Shower chair or bench in shower? No  Elevated toilet seat or a handicapped toilet? Yes   TIMED UP AND GO:  Was the test performed? No . Telephonic visit   Cognitive Function:        11/08/2022   12:03 PM  6CIT Screen  What  Year? 0 points  What month? 0 points  What time? 0 points  Count back from 20 0 points  Months in reverse 0 points  Repeat phrase 0 points  Total Score 0 points    Immunizations Immunization History  Administered Date(s) Administered   Fluad Quad(high Dose 65+) 08/01/2020, 09/19/2022   H1N1 10/05/2008   Influenza Split 08/12/2013   Influenza,inj,Quad PF,6+ Mos 10/04/2014, 08/22/2015, 09/06/2016   Influenza-Unspecified 1941-02-06, 08/21/2001, 08/11/2012, 09/30/2017, 08/11/2018, 09/26/2019   PFIZER(Purple Top)SARS-COV-2 Vaccination 12/13/2019, 01/03/2020   Pneumococcal Conjugate-13 10/04/2014   Pneumococcal Polysaccharide-23 07/31/2007   RSV,unspecified 09/19/2022   Tdap 06/20/2017    TDAP status: Up to date  Flu Vaccine status: Up to date  Pneumococcal vaccine status: Up to date  Covid-19 vaccine status: Information provided on how to obtain vaccines.   Qualifies for Shingles Vaccine? Yes   Zostavax completed No   Shingrix Completed?: No.    Education has been provided regarding the importance of this vaccine. Patient has been advised to call insurance company to determine out of pocket expense if they have not yet received this vaccine. Advised may also receive vaccine at local pharmacy or Health Dept. Verbalized acceptance and understanding.  Screening Tests Health Maintenance  Topic Date Due   Zoster Vaccines- Shingrix (1 of 2) Never done   COVID-19 Vaccine (3 -  Pfizer risk series) 01/31/2020   HEMOGLOBIN A1C  02/22/2023   OPHTHALMOLOGY EXAM  04/04/2023   FOOT EXAM  05/09/2023   Diabetic kidney evaluation - eGFR measurement  08/24/2023   Diabetic kidney evaluation - Urine ACR  08/24/2023   Medicare Annual Wellness (AWV)  11/09/2023   DTaP/Tdap/Td (2 - Td or Tdap) 06/21/2027   Pneumonia Vaccine 79+ Years old  Completed   INFLUENZA VACCINE  Completed   DEXA SCAN  Completed   HPV VACCINES  Aged Out    Health Maintenance  Health Maintenance Due  Topic Date Due    Zoster Vaccines- Shingrix (1 of 2) Never done   COVID-19 Vaccine (3 - Pfizer risk series) 01/31/2020    Colorectal cancer screening: No longer required.   Mammogram status: No longer required due to age.  Bone Density status: Completed 04/11/22. Results reflect: Bone density results: OSTEOPOROSIS. Repeat every 2 years.  Lung Cancer Screening: (Low Dose CT Chest recommended if Age 75-80 years, 30 pack-year currently smoking OR have quit w/in 15years.) does not qualify.   Lung Cancer Screening Referral: n/a  Additional Screening:  Hepatitis C Screening: does not qualify  Vision Screening: Recommended annual ophthalmology exams for early detection of glaucoma and other disorders of the eye. Is the patient up to date with their annual eye exam?  Yes  Who is the provider or what is the name of the office in which the patient attends annual eye exams? [facility]  If pt is not established with a provider, would they like to be referred to a provider to establish care? No .   Dental Screening: Recommended annual dental exams for proper oral hygiene  Community Resource Referral / Chronic Care Management: CRR required this visit?  No   CCM required this visit?  No      Plan:     I have personally reviewed and noted the following in the patient's chart:   Medical and social history Use of alcohol, tobacco or illicit drugs  Current medications and supplements including opioid prescriptions. Patient is currently taking opioid prescriptions. Information provided to patient regarding non-opioid alternatives. Patient advised to discuss non-opioid treatment plan with their provider. Functional ability and status Nutritional status Physical activity Advanced directives List of other physicians Hospitalizations, surgeries, and ER visits in previous 12 months Vitals Screenings to include cognitive, depression, and falls Referrals and appointments  In addition, I have reviewed  and discussed with patient certain preventive protocols, quality metrics, and best practice recommendations. A written personalized care plan for preventive services as well as general preventive health recommendations were provided to patient.     Vanetta Mulders, [place]   [id number]   Due to this being a virtual visit, the after visit summary with patients personalized plan was offered to patient via mail or my-chart. Per request, patient was mailed a copy of AVS.  Nurse Notes: No concerns

## 2022-11-08 NOTE — Patient Instructions (Addendum)
[name] , Thank you for taking time to come for your Medicare Wellness Visit. I appreciate your ongoing commitment to your health goals. Please review the following plan we discussed and let me know if I can assist you in the future.   These are the goals we discussed:  Goals      Exercise 3x per week (30 min per time)     Prevent falls        This is a list of the screening recommended for you and due dates:  Health Maintenance  Topic Date Due   Zoster (Shingles) Vaccine (1 of 2) Never done   COVID-19 Vaccine (3 - Pfizer risk series) 01/31/2020   Hemoglobin A1C  02/22/2023   Eye exam for diabetics  04/04/2023   Complete foot exam   05/09/2023   Yearly kidney function blood test for diabetes  08/24/2023   Yearly kidney health urinalysis for diabetes  08/24/2023   Medicare Annual Wellness Visit  11/09/2023   DTaP/Tdap/Td vaccine (2 - Td or Tdap) 06/21/2027   Pneumonia Vaccine  Completed   Flu Shot  Completed   DEXA scan (bone density measurement)  Completed   HPV Vaccine  Aged Out    Advanced directives: Advance directive discussed with you today. I have provided a copy for you to complete at home and have notarized. Once this is complete please bring a copy in to our office so we can scan it into your chart.   Conditions/risks identified: Aim for 30 minutes of exercise or brisk walking, 6-8 glasses of water, and 5 servings of fruits and vegetables each day.   Next appointment: Follow up in one year for your annual wellness visit    Preventive Care 65 Years and Older, Female Preventive care refers to lifestyle choices and visits with your health care provider that can promote health and wellness. What does preventive care include? A yearly physical exam. This is also called an annual well check. Dental exams once or twice a year. Routine eye exams. Ask your health care provider how often you should have your eyes checked. Personal lifestyle choices, including: Daily care  of your teeth and gums. Regular physical activity. Eating a healthy diet. Avoiding tobacco and drug use. Limiting alcohol use. Practicing safe sex. Taking low-dose aspirin every day. Taking vitamin and mineral supplements as recommended by your health care provider. What happens during an annual well check? The services and screenings done by your health care provider during your annual well check will depend on your age, overall health, lifestyle risk factors, and family history of disease. Counseling  Your health care provider may ask you questions about your: Alcohol use. Tobacco use. Drug use. Emotional well-being. Home and relationship well-being. Sexual activity. Eating habits. History of falls. Memory and ability to understand (cognition). Work and work Statistician. Reproductive health. Screening  You may have the following tests or measurements: Height, weight, and BMI. Blood pressure. Lipid and cholesterol levels. These may be checked every 5 years, or more frequently if you are over 38 years old. Skin check. Lung cancer screening. You may have this screening every year starting at age 68 if you have a 30-pack-year history of smoking and currently smoke or have quit within the past 15 years. Fecal occult blood test (FOBT) of the stool. You may have this test every year starting at age 79. Flexible sigmoidoscopy or colonoscopy. You may have a sigmoidoscopy every 5 years or a colonoscopy every 10 years starting at  age 44. Hepatitis C blood test. Hepatitis B blood test. Sexually transmitted disease (STD) testing. Diabetes screening. This is done by checking your blood sugar (glucose) after you have not eaten for a while (fasting). You may have this done every 1-3 years. Bone density scan. This is done to screen for osteoporosis. You may have this done starting at age 11. Mammogram. This may be done every 1-2 years. Talk to your health care provider about how often you  should have regular mammograms. Talk with your health care provider about your test results, treatment options, and if necessary, the need for more tests. Vaccines  Your health care provider may recommend certain vaccines, such as: Influenza vaccine. This is recommended every year. Tetanus, diphtheria, and acellular pertussis (Tdap, Td) vaccine. You may need a Td booster every 10 years. Zoster vaccine. You may need this after age 47. Pneumococcal 13-valent conjugate (PCV13) vaccine. One dose is recommended after age 74. Pneumococcal polysaccharide (PPSV23) vaccine. One dose is recommended after age 15. Talk to your health care provider about which screenings and vaccines you need and how often you need them. This information is not intended to replace advice given to you by your health care provider. Make sure you discuss any questions you have with your health care provider. Document Released: 11/24/2015 Document Revised: 07/17/2016 Document Reviewed: 08/29/2015 Elsevier Interactive Patient Education  2017 [place] Prevention in the Home Falls can cause injuries. They can happen to people of all ages. There are many things you can do to make your home safe and to help prevent falls. What can I do on the outside of my home? Regularly fix the edges of walkways and driveways and fix any cracks. Remove anything that might make you trip as you walk through a door, such as a raised step or threshold. Trim any bushes or trees on the path to your home. Use bright outdoor lighting. Clear any walking paths of anything that might make someone trip, such as rocks or tools. Regularly check to see if handrails are loose or broken. Make sure that both sides of any steps have handrails. Any raised decks and porches should have guardrails on the edges. Have any leaves, snow, or ice cleared regularly. Use sand or salt on walking paths during winter. Clean up any spills in your garage right away.  This includes oil or grease spills. What can I do in the bathroom? Use night lights. Install grab bars by the toilet and in the tub and shower. Do not use towel bars as grab bars. Use non-skid mats or decals in the tub or shower. If you need to sit down in the shower, use a plastic, non-slip stool. Keep the floor dry. Clean up any water that spills on the floor as soon as it happens. Remove soap buildup in the tub or shower regularly. Attach bath mats securely with double-sided non-slip rug tape. Do not have throw rugs and other things on the floor that can make you trip. What can I do in the bedroom? Use night lights. Make sure that you have a light by your bed that is easy to reach. Do not use any sheets or blankets that are too big for your bed. They should not hang down onto the floor. Have a firm chair that has side arms. You can use this for support while you get dressed. Do not have throw rugs and other things on the floor that can make you trip. What can I  do in the kitchen? Clean up any spills right away. Avoid walking on wet floors. Keep items that you use a lot in easy-to-reach places. If you need to reach something above you, use a strong step stool that has a grab bar. Keep electrical cords out of the way. Do not use floor polish or wax that makes floors slippery. If you must use wax, use non-skid floor wax. Do not have throw rugs and other things on the floor that can make you trip. What can I do with my stairs? Do not leave any items on the stairs. Make sure that there are handrails on both sides of the stairs and use them. Fix handrails that are broken or loose. Make sure that handrails are as long as the stairways. Check any carpeting to make sure that it is firmly attached to the stairs. Fix any carpet that is loose or worn. Avoid having throw rugs at the top or bottom of the stairs. If you do have throw rugs, attach them to the floor with carpet tape. Make sure that  you have a light switch at the top of the stairs and the bottom of the stairs. If you do not have them, ask someone to add them for you. What else can I do to help prevent falls? Wear shoes that: Do not have high heels. Have rubber bottoms. Are comfortable and fit you well. Are closed at the toe. Do not wear sandals. If you use a stepladder: Make sure that it is fully opened. Do not climb a closed stepladder. Make sure that both sides of the stepladder are locked into place. Ask someone to hold it for you, if possible. Clearly mark and make sure that you can see: Any grab bars or handrails. First and last steps. Where the edge of each step is. Use tools that help you move around (mobility aids) if they are needed. These include: Canes. Walkers. Scooters. Crutches. Turn on the lights when you go into a dark area. Replace any light bulbs as soon as they burn out. Set up your furniture so you have a clear path. Avoid moving your furniture around. If any of your floors are uneven, fix them. If there are any pets around you, be aware of where they are. Review your medicines with your doctor. Some medicines can make you feel dizzy. This can increase your chance of falling. Ask your doctor what other things that you can do to help prevent falls. This information is not intended to replace advice given to you by your health care provider. Make sure you discuss any questions you have with your health care provider. Document Released: 08/24/2009 Document Revised: 04/04/2016 Document Reviewed: 12/02/2014 Elsevier Interactive Patient Education  2017 [employer].

## 2022-12-08 ENCOUNTER — Other Ambulatory Visit: Payer: Self-pay | Admitting: Family Medicine

## 2022-12-08 DIAGNOSIS — J189 Pneumonia, unspecified organism: Secondary | ICD-10-CM

## 2022-12-09 NOTE — Telephone Encounter (Signed)
6 months is fine

## 2022-12-10 NOTE — Telephone Encounter (Signed)
Refill these for 6 months thank you

## 2023-01-14 DIAGNOSIS — C44219 Basal cell carcinoma of skin of left ear and external auricular canal: Secondary | ICD-10-CM | POA: Diagnosis not present

## 2023-01-14 DIAGNOSIS — C4441 Basal cell carcinoma of skin of scalp and neck: Secondary | ICD-10-CM | POA: Diagnosis not present

## 2023-01-14 DIAGNOSIS — D485 Neoplasm of uncertain behavior of skin: Secondary | ICD-10-CM | POA: Diagnosis not present

## 2023-01-14 DIAGNOSIS — C44319 Basal cell carcinoma of skin of other parts of face: Secondary | ICD-10-CM | POA: Diagnosis not present

## 2023-01-31 DIAGNOSIS — I10 Essential (primary) hypertension: Secondary | ICD-10-CM | POA: Diagnosis not present

## 2023-01-31 DIAGNOSIS — N182 Chronic kidney disease, stage 2 (mild): Secondary | ICD-10-CM | POA: Diagnosis not present

## 2023-01-31 DIAGNOSIS — E1122 Type 2 diabetes mellitus with diabetic chronic kidney disease: Secondary | ICD-10-CM | POA: Diagnosis not present

## 2023-02-01 LAB — LIPID PANEL
Chol/HDL Ratio: 3.1 ratio (ref 0.0–4.4)
Cholesterol, Total: 174 mg/dL (ref 100–199)
HDL: 57 mg/dL (ref >39)
LDL Chol Calc (NIH): 98 mg/dL (ref 0–99)
Triglycerides: 103 mg/dL (ref 0–149)
VLDL Cholesterol Cal: 19 mg/dL (ref 5–40)

## 2023-02-01 LAB — HEPATIC FUNCTION PANEL
ALT: 19 IU/L (ref 0–32)
AST: 17 IU/L (ref 0–40)
Albumin: 4.3 g/dL (ref 3.7–4.7)
Alkaline Phosphatase: 154 IU/L — ABNORMAL HIGH (ref 44–121)
Bilirubin Total: 0.3 mg/dL (ref 0.0–1.2)
Bilirubin, Direct: 0.11 mg/dL (ref 0.00–0.40)
Total Protein: 6.4 g/dL (ref 6.0–8.5)

## 2023-02-01 LAB — HEMOGLOBIN A1C
Est. average glucose Bld gHb Est-mCnc: 148 mg/dL
Hgb A1c MFr Bld: 6.8 % — ABNORMAL HIGH (ref 4.8–5.6)

## 2023-02-01 LAB — BASIC METABOLIC PANEL (7)
BUN/Creatinine Ratio: 13 (ref 12–28)
BUN: 13 mg/dL (ref 8–27)
CO2: 23 mmol/L (ref 20–29)
Chloride: 105 mmol/L (ref 96–106)
Creatinine, Ser: 0.98 mg/dL (ref 0.57–1.00)
Glucose: 137 mg/dL — ABNORMAL HIGH (ref 70–99)
Potassium: 4.6 mmol/L (ref 3.5–5.2)
Sodium: 145 mmol/L — ABNORMAL HIGH (ref 134–144)
eGFR: 58 mL/min/{1.73_m2} — ABNORMAL LOW (ref >59)

## 2023-02-02 ENCOUNTER — Other Ambulatory Visit: Payer: Self-pay | Admitting: Cardiovascular Disease

## 2023-02-05 ENCOUNTER — Ambulatory Visit (INDEPENDENT_AMBULATORY_CARE_PROVIDER_SITE_OTHER): Payer: 59 | Admitting: Family Medicine

## 2023-02-05 VITALS — BP 122/78 | HR 72 | Temp 97.8°F | Ht 62.5 in | Wt 157.0 lb

## 2023-02-05 DIAGNOSIS — R300 Dysuria: Secondary | ICD-10-CM | POA: Diagnosis not present

## 2023-02-05 DIAGNOSIS — N289 Disorder of kidney and ureter, unspecified: Secondary | ICD-10-CM

## 2023-02-05 DIAGNOSIS — E1142 Type 2 diabetes mellitus with diabetic polyneuropathy: Secondary | ICD-10-CM

## 2023-02-05 DIAGNOSIS — E1122 Type 2 diabetes mellitus with diabetic chronic kidney disease: Secondary | ICD-10-CM | POA: Diagnosis not present

## 2023-02-05 DIAGNOSIS — I10 Essential (primary) hypertension: Secondary | ICD-10-CM | POA: Diagnosis not present

## 2023-02-05 DIAGNOSIS — G8929 Other chronic pain: Secondary | ICD-10-CM

## 2023-02-05 DIAGNOSIS — N182 Chronic kidney disease, stage 2 (mild): Secondary | ICD-10-CM | POA: Diagnosis not present

## 2023-02-05 DIAGNOSIS — J019 Acute sinusitis, unspecified: Secondary | ICD-10-CM | POA: Diagnosis not present

## 2023-02-05 LAB — POCT URINALYSIS DIP (CLINITEK)
Bilirubin, UA: NEGATIVE
Blood, UA: NEGATIVE
Glucose, UA: NEGATIVE mg/dL
Ketones, POC UA: NEGATIVE mg/dL
Leukocytes, UA: NEGATIVE
Nitrite, UA: NEGATIVE
Spec Grav, UA: 1.02 (ref 1.010–1.025)
Urobilinogen, UA: 0.2 E.U./dL
pH, UA: 6 (ref 5.0–8.0)

## 2023-02-05 MED ORDER — FLUTICASONE PROPIONATE 50 MCG/ACT NA SUSP: 2.0000 | Freq: Every day | NASAL | 12 refills | Status: DC

## 2023-02-05 MED ORDER — HYDROCODONE-ACETAMINOPHEN 5-325 MG PO TABS: ORAL_TABLET | ORAL | 0 refills | Status: DC

## 2023-02-05 MED ORDER — METFORMIN HCL 500 MG PO TABS: ORAL_TABLET | ORAL | 1 refills | Status: DC

## 2023-02-05 MED ORDER — ALPRAZOLAM 0.5 MG PO TABS: ORAL_TABLET | ORAL | 4 refills | Status: DC

## 2023-02-05 MED ORDER — CEFDINIR 300 MG PO CAPS: 300.0000 mg | ORAL_CAPSULE | Freq: Two times a day (BID) | ORAL | 0 refills | Status: DC

## 2023-02-05 NOTE — Patient Instructions (Addendum)

## 2023-02-05 NOTE — Progress Notes (Signed)
Subjective:    Patient ID: Elizabeth Aguilar, female    DOB: 09-25-1941, 82 y.o.   MRN: [id number]  HPI 3 month follow up and dysuria, nasal drainage causing some cough  This patient was seen today for chronic pain  The medication list was reviewed and updated.  Location of Pain for which the patient has been treated with regarding narcotics: Patient has chronic ankle pain she also has back pain she only takes 1 pain pill per day very rarely two  Onset of this pain: Present for years   -Compliance with medication: Good compliance with medicine  - Number patient states they take daily: 1 daily very rarely taking 2 tablets in a day  -when was the last dose patient took?  Yesterday  The patient was advised the importance of maintaining medication and not using illegal substances with these.  Here for refills and follow up  The patient was educated that we can provide 3 monthly scripts for their medication, it is their responsibility to follow the instructions.  Side effects or complications from medications: Denies side effects  Patient is aware that pain medications are meant to minimize the severity of the pain to allow their pain levels to improve to allow for better function. They are aware of that pain medications cannot totally remove their pain.  Due for UDT ( at least once per year) : On future labs  Scale of 1 to 10 ( 1 is least 10 is most) Your pain level without the medicine: 7 Your pain level with medication 4  Scale 1 to 10 ( 1-helps very little, 10 helps very well) How well does your pain medication reduce your pain so you can function better through out the day? 8  Quality of the pain: Throbbing aching  Persistence of the pain: Worse with activity present most of the time  Modifying factors: Worse with activity  Dysuria - Plan: POCT URINALYSIS DIP (CLINITEK)  Acute rhinosinusitis  Type 2 diabetes mellitus with stage 2 chronic kidney disease, without  long-term current use of insulin (HCC)  Primary hypertension  Diabetic peripheral neuropathy (HCC)  Renal insufficiency  Encounter for chronic pain management  She does use alprazolam she uses it rarely during the day most of the time she uses at night to help her sleep without it she states she cannot sleep well  Patient for blood pressure check up.  The patient does have hypertension.   Patient relates dietary measures try to minimize salt The importance of healthy diet and activity were discussed Patient relates compliance  Sinus symptoms over the past week head congestion sinus pressure drainage coughing denies wheezing difficulty breathing  Patient here for follow-up regarding cholesterol.    Patient relates taking medication on a regular basis Denies problems with medication Importance of dietary measures discussed Regular lab work regarding lipid and liver was checked and if needing additional labs was appropriately ordered     Review of Systems     Objective:   Physical Exam General-in no acute distress Eyes-no discharge Lungs-respiratory rate normal, CTA CV-no murmurs,RRR Extremities skin warm dry no edema Neuro grossly normal Behavior normal, alert        Assessment & Plan:  1. Dysuria I do not find any evidence of blood or infection under the microscope - POCT URINALYSIS DIP (CLINITEK)  2. Acute rhinosinusitis Go ahead and treat with antibiotics warnings discussed  3. Type 2 diabetes mellitus with stage 2 chronic kidney disease, without long-term current  use of insulin ([place]) Patient had recent lab work A1c looks good control continue metformin GFR looks good  4. Primary hypertension GFR looks good blood pressure good continue current measures  5. Diabetic peripheral neuropathy (HCC) Pain medicine does help uses it sparingly  6. Renal insufficiency Slight drop in GFR monitor closely  7. Encounter for chronic pain management The patient was  seen in followup for chronic pain. A review over at their current pain status was discussed. Drug registry was checked. Prescriptions were given.  Regular follow-up recommended. Discussion was held regarding the importance of compliance with medication as well as pain medication contract.  Patient was informed that medication may cause drowsiness and should not be combined  with other medications/alcohol or street drugs. If the patient feels medication is causing altered alertness then do not drive or operate dangerous equipment.  Should be noted that the patient appears to be meeting appropriate use of opioids and response.  Evidenced by improved function and decent pain control without significant side effects and no evidence of overt aberrancy issues.  Upon discussion with the patient today they understand that opioid therapy is optional and they feel that the pain has been refractory to reasonable conservative measures and is significant and affecting quality of life enough to warrant ongoing therapy and wishes to continue opioids.  Refills were provided.

## 2023-02-12 ENCOUNTER — Telehealth: Payer: Self-pay

## 2023-02-12 ENCOUNTER — Other Ambulatory Visit: Payer: Self-pay | Admitting: Family Medicine

## 2023-02-12 MED ORDER — AMOXICILLIN 500 MG PO CAPS: ORAL_CAPSULE | ORAL | 0 refills | Status: DC

## 2023-02-12 NOTE — Progress Notes (Signed)
Patient is not truly allergic to Augmentin she stated it made her feel bad.  This is not an allergy this is a side effect.  She has multiple stated side effect issues with multiple medicines therefore we will go with amoxicillin for 7 days

## 2023-02-12 NOTE — Telephone Encounter (Signed)
I would recommend amoxicillin for 7 days-patient is not allergic to amoxicillin she states that Augmentin made her feel bad but did not cause hives according to the medical record.  Therefore amoxicillin 1 taken 3 times daily for 7 days. Highly recommend taking allergy medicines.  Also allergy symptoms will be very prominent for most individuals for a few more weeks then should gradually get better if any worrisome issues please follow-up

## 2023-02-12 NOTE — Telephone Encounter (Signed)
Pt is calling she seen Dr Nicki Reaper last week for sinus infection it has been 7 days she was put on cefdinir (OMNICEF) 300 MG capsule she took this and still not improving wanting to know if Dr Nicki Reaper can call her in another medication of something different. Congestion, cough   Send to [facility] in [place] [facility]

## 2023-02-12 NOTE — Telephone Encounter (Signed)
Patient reports having a runny nose drainage is worse at night, coughing up yellow mucus . Taking tylenol denies fevers or any other symptoms.  Completed cefdinir abx , pease advise.

## 2023-02-14 NOTE — Telephone Encounter (Signed)
Patient notified and stated she picked up the medication and started it yesterday and she will also start allergy meds as recommended.

## 2023-02-25 DIAGNOSIS — C44219 Basal cell carcinoma of skin of left ear and external auricular canal: Secondary | ICD-10-CM | POA: Diagnosis not present

## 2023-03-06 ENCOUNTER — Other Ambulatory Visit: Payer: Self-pay | Admitting: Family Medicine

## 2023-04-06 ENCOUNTER — Other Ambulatory Visit: Payer: Self-pay | Admitting: Family Medicine

## 2023-04-21 DIAGNOSIS — U071 COVID-19: Secondary | ICD-10-CM | POA: Diagnosis not present

## 2023-04-21 DIAGNOSIS — R051 Acute cough: Secondary | ICD-10-CM | POA: Diagnosis not present

## 2023-05-07 ENCOUNTER — Ambulatory Visit (INDEPENDENT_AMBULATORY_CARE_PROVIDER_SITE_OTHER): Payer: 59 | Admitting: Nurse Practitioner

## 2023-05-07 ENCOUNTER — Other Ambulatory Visit: Payer: Self-pay | Admitting: Family Medicine

## 2023-05-07 VITALS — BP 145/73 | HR 70 | Temp 97.7°F | Wt 158.2 lb

## 2023-05-07 DIAGNOSIS — R300 Dysuria: Secondary | ICD-10-CM | POA: Diagnosis not present

## 2023-05-07 DIAGNOSIS — R8281 Pyuria: Secondary | ICD-10-CM

## 2023-05-07 DIAGNOSIS — N390 Urinary tract infection, site not specified: Secondary | ICD-10-CM | POA: Diagnosis not present

## 2023-05-07 LAB — POCT URINALYSIS DIP (CLINITEK)
Bilirubin, UA: NEGATIVE
Glucose, UA: NEGATIVE mg/dL
Ketones, POC UA: NEGATIVE mg/dL
Nitrite, UA: POSITIVE — AB
POC PROTEIN,UA: >=300 — AB
Spec Grav, UA: >=1.03 — AB (ref 1.010–1.025)
Urobilinogen, UA: 0.2 E.U./dL
pH, UA: 6 (ref 5.0–8.0)

## 2023-05-07 MED ORDER — PHENAZOPYRIDINE HCL 200 MG PO TABS: 200.0000 mg | ORAL_TABLET | Freq: Three times a day (TID) | ORAL | 0 refills | Status: DC | PRN

## 2023-05-07 MED ORDER — CEFDINIR 300 MG PO CAPS: 300.0000 mg | ORAL_CAPSULE | Freq: Two times a day (BID) | ORAL | 0 refills | Status: DC

## 2023-05-07 NOTE — Progress Notes (Unsigned)
Subjective:    Patient ID: Elizabeth Aguilar, female    DOB: 05-30-1941, 82 y.o.   MRN: [id number]  HPI Patient arrives today with dysuria, causing nausea, and cramps.   Presents with complaints of urinary symptoms for the past few days.  Dysuria.  Voiding small amounts.  Urgency and frequency.  Nausea but no vomiting yesterday, none today.  Taking fluids well.  No CVA or flank pain.  No fever.  Has had several UTIs on the following dates: 01/23/2022, 07/30/2022, and 11/06/2022.  These were all verified with urine cultures.   Review of Systems  Constitutional:  Negative for fever.  Respiratory:  Negative for cough, chest tightness and shortness of breath.   Cardiovascular:  Negative for chest pain.  Genitourinary:  Positive for difficulty urinating, dysuria, frequency, pelvic pain and urgency. Negative for flank pain.       Objective:   Physical Exam NAD.  Alert, oriented.  Lungs clear.  Heart regular rate rhythm.  No CVA or flank tenderness on exam.  Abdomen soft nondistended with mild suprapubic area tenderness to palpation. Results for orders placed or performed in visit on 05/07/23  POCT URINALYSIS DIP (CLINITEK)  Result Value Ref Range   Color, UA straw (A) yellow   Clarity, UA hazy (A) clear   Glucose, UA negative negative mg/dL   Bilirubin, UA negative negative   Ketones, POC UA negative negative mg/dL   Spec Grav, UA >=7.253 (A) 1.010 - 1.025   Blood, UA large (A) negative   pH, UA 6.0 5.0 - 8.0   POC PROTEIN,UA >=300 (A) negative, trace   Urobilinogen, UA 0.2 0.2 or 1.0 E.U./dL   Nitrite, UA Positive (A) Negative   Leukocytes, UA Moderate (2+) (A) Negative   Unable to do urine microscopic due to the small amount of urine.  Remainder was sent for culture. Today's Vitals   05/07/23 1559  BP: (!) 145/73  Pulse: 70  Temp: 97.7 F (36.5 C)  TempSrc: Oral  SpO2: 98%  Weight: 158 lb 3.2 oz (71.8 kg)   Body mass index is 28.47 kg/m.       Assessment & Plan:    Problem List Items Addressed This Visit       Genitourinary   Recurrent UTI - Primary   Relevant Medications   phenazopyridine (PYRIDIUM) 200 MG tablet   cefdinir (OMNICEF) 300 MG capsule   Other Relevant Orders   Ambulatory referral to Urology   Other Visit Diagnoses     Dysuria       Relevant Orders   POCT URINALYSIS DIP (CLINITEK) (Completed)   Urine Culture   Ambulatory referral to Urology   Pyuria       Relevant Orders   Ambulatory referral to Urology        Meds ordered this encounter  Medications   phenazopyridine (PYRIDIUM) 200 MG tablet    Sig: Take 1 tablet (200 mg total) by mouth 3 (three) times daily as needed for pain.    Dispense:  10 tablet    Refill:  0    Order Specific Question:   Supervising Provider    Answer:   Lilyan Punt A [[id number]]   cefdinir (OMNICEF) 300 MG capsule    Sig: Take 1 capsule (300 mg total) by mouth 2 (two) times daily.    Dispense:  14 capsule    Refill:  0    Order Specific Question:   Supervising Provider    Answer:   Lilyan Punt  A [[id number]]   Use Pyridium as directed for the next 48 hours then discontinue.  Start antibiotic as directed.  Urine culture pending. Increase clear fluid intake.  Warning signs reviewed.  Call back in 48 hours if no improvement, go to ED sooner if worse. Referred to local urology for evaluation due to recurrent UTIs.

## 2023-05-07 NOTE — Patient Instructions (Signed)
Take Pyridium for 48 hours then stop

## 2023-05-08 ENCOUNTER — Encounter: Payer: Self-pay | Admitting: Nurse Practitioner

## 2023-05-08 ENCOUNTER — Ambulatory Visit: Payer: 59 | Admitting: Family Medicine

## 2023-05-11 LAB — URINE CULTURE

## 2023-05-11 LAB — SPECIMEN STATUS REPORT

## 2023-05-14 ENCOUNTER — Telehealth: Payer: Self-pay

## 2023-05-14 NOTE — Telephone Encounter (Signed)
Prescription Request  05/14/2023  LOV: Visit date not found  What is the name of the medication or equipment? ALPRAZolam (XANAX) 0.5 MG tablet , HYDROcodone-acetaminophen (NORCO/VICODIN) 5-325 MG tablet   Have you contacted your pharmacy to request a refill? Yes   Which [facility] would you [facility]   Patient notified that their request is being sent to the clinical staff for review and that they should receive a response within 2 business days.   Please advise at Mobile [phone] (mobile)

## 2023-05-16 ENCOUNTER — Other Ambulatory Visit: Payer: Self-pay | Admitting: Family Medicine

## 2023-05-16 MED ORDER — HYDROCODONE-ACETAMINOPHEN 5-325 MG PO TABS: ORAL_TABLET | ORAL | 0 refills | Status: DC

## 2023-05-16 NOTE — Telephone Encounter (Signed)
Hydrocodone was sent in, as for alprazolam she just recently got that filled about 8 to 9 days ago she has an appointment later in July please keep

## 2023-05-16 NOTE — Telephone Encounter (Signed)
Patient notified and verbalized understanding. 

## 2023-05-30 ENCOUNTER — Ambulatory Visit: Payer: 59 | Admitting: Urology

## 2023-06-02 ENCOUNTER — Ambulatory Visit: Payer: 59 | Admitting: Family Medicine

## 2023-06-05 ENCOUNTER — Ambulatory Visit (INDEPENDENT_AMBULATORY_CARE_PROVIDER_SITE_OTHER): Payer: 59 | Admitting: Family Medicine

## 2023-06-05 VITALS — BP 130/68 | HR 75 | Wt 159.8 lb

## 2023-06-05 DIAGNOSIS — E1169 Type 2 diabetes mellitus with other specified complication: Secondary | ICD-10-CM | POA: Diagnosis not present

## 2023-06-05 DIAGNOSIS — N182 Chronic kidney disease, stage 2 (mild): Secondary | ICD-10-CM | POA: Diagnosis not present

## 2023-06-05 DIAGNOSIS — E1142 Type 2 diabetes mellitus with diabetic polyneuropathy: Secondary | ICD-10-CM | POA: Diagnosis not present

## 2023-06-05 DIAGNOSIS — E1122 Type 2 diabetes mellitus with diabetic chronic kidney disease: Secondary | ICD-10-CM

## 2023-06-05 DIAGNOSIS — R7989 Other specified abnormal findings of blood chemistry: Secondary | ICD-10-CM

## 2023-06-05 DIAGNOSIS — L659 Nonscarring hair loss, unspecified: Secondary | ICD-10-CM

## 2023-06-05 DIAGNOSIS — E785 Hyperlipidemia, unspecified: Secondary | ICD-10-CM | POA: Diagnosis not present

## 2023-06-05 DIAGNOSIS — I10 Essential (primary) hypertension: Secondary | ICD-10-CM | POA: Diagnosis not present

## 2023-06-05 DIAGNOSIS — Z79899 Other long term (current) drug therapy: Secondary | ICD-10-CM

## 2023-06-05 DIAGNOSIS — F419 Anxiety disorder, unspecified: Secondary | ICD-10-CM

## 2023-06-05 NOTE — Progress Notes (Signed)
Subjective:    Patient ID: Elizabeth Aguilar, female    DOB: November 20, 1940, 82 y.o.   MRN: [id number]  HPI This patient was seen today for chronic pain  The medication list was reviewed and updated.   Location of Pain for which the patient has been treated with regarding narcotics: Lumbar pain in the back as well as the neck  Onset of this pain: Present for years   -Compliance with medication: Good compliance  - Number patient states they take daily: Takes 1/day in the evening  -Reason for ongoing use of opioids Tylenol helps but not enough  What other measures have been tried outside of opioids stretches exercises  In the ongoing specialists regarding this condition none  -when was the last dose patient took? Last night 9:30pm  The patient was advised the importance of maintaining medication and not using illegal substances with these.  Here for refills and follow up  The patient was educated that we can provide 3 monthly scripts for their medication, it is their responsibility to follow the instructions.  Side effects or complications from medications: None  Patient is aware that pain medications are meant to minimize the severity of the pain to allow their pain levels to improve to allow for better function. They are aware of that pain medications cannot totally remove their pain.  Due for UDT ( at least once per year) (pain management contract is also completed at the time of the UDT): 11/13/2021  Scale of 1 to 10 ( 1 is least 10 is most) Your pain level without the medicine: 8 Your pain level with medication 5  Scale 1 to 10 ( 1-helps very little, 10 helps very well) How well does your pain medication reduce your pain so you can function better through out the day? 7  Quality of the pain: Aching throbbing  Persistence of the pain: Present all the time  Modifying factors: Worse with activity  Type 2 diabetes mellitus with stage 2 chronic kidney disease, without  long-term current use of insulin (HCC) - Plan: Hemoglobin A1c, Microalbumin/Creatinine Ratio, Urine  Primary hypertension - Plan: Basic Metabolic Panel  Diabetic peripheral neuropathy (HCC)  Elevated serum creatinine  High risk medication use - Plan: Hepatic Function Panel  Hyperlipidemia associated with type 2 diabetes mellitus (HCC) - Plan: Lipid Panel  Hair loss - Plan: TSH Past Medical History:  Diagnosis Date   CAD (coronary artery disease)    a. s/p STEMI in 2015 with DES to LAD and stgaed PCI with DES to RCA   Chest pain 10/10/2014   Chronic combined systolic and diastolic congestive heart failure (HCC)    a. 03/2017: echo showing mildly reduced EF of 40-45% with Grade 1 DD and mild to moderate MR.    Hyperlipidemia    Irritable bowel syndrome    Myocardial infarction [facility])    Osteoporosis 04/14/2022   Detected on bone density, Fosamax started, June 2023   Pericarditis    Type 2 diabetes mellitus (HCC)    Outpatient Encounter Medications as of 06/05/2023  Medication Sig   albuterol (VENTOLIN HFA) 108 (90 Base) MCG/ACT inhaler USE 2 PUFFS EVERY 6 HOURS AS NEEDED   ALPRAZolam (XANAX) 0.5 MG tablet TAKE 1/2 TABLET DURING THE DAY & TAKE 1 TABLET AT BEDTIME AS NEEDED   amLODipine (NORVASC) 5 MG tablet Take 1 tablet (5 mg total) by mouth daily.   aspirin EC 81 MG EC tablet Take 1 tablet (81 mg total) by mouth  daily.   atorvastatin (LIPITOR) 40 MG tablet TAKE 1 TABLET DAILY AT 6PM   cetirizine (ZYRTEC) 10 MG tablet TAKE ONE (1) TABLET BY MOUTH EVERY DAY   esomeprazole (NEXIUM) 40 MG capsule TAKE ONE (1) CAPSULE EACH DAY   ezetimibe (ZETIA) 10 MG tablet TAKE ONE (1) TABLET BY MOUTH EVERY DAY   fluticasone (FLONASE) 50 MCG/ACT nasal spray Place 2 sprays into both nostrils daily.   furosemide (LASIX) 20 MG tablet TAKE 2 TABLETS EVERY MORNING UNTIL THE SWELLING GOES DOWN THEN TAKE 1 TABLET EVERY MORNING   gabapentin (NEURONTIN) 100 MG capsule TAKE TWO CAPSULES BY MOUTH AT BEDTIME  DAILY   HYDROcodone-acetaminophen (NORCO/VICODIN) 5-325 MG tablet 1 tablet BID prn pain   HYDROcodone-acetaminophen (NORCO/VICODIN) 5-325 MG tablet 1 pill taken once or twice daily as needed for severe pain   HYDROcodone-acetaminophen (NORCO/VICODIN) 5-325 MG tablet 1 pill taken once or twice daily as needed for pain   ketoconazole (NIZORAL) 2 % cream APPLY TWICE DAILY   metFORMIN (GLUCOPHAGE) 500 MG tablet TAKE ONE (1) TABLET BY MOUTH TWO (2) TIMES DAILY   metoprolol succinate (TOPROL-XL) 50 MG 24 hr tablet TAKE 1 TABLET IN THE MORNING AND AT BEDTIME WITH FOOD   Multiple Vitamin (MULTIVITAMIN WITH MINERALS) TABS tablet Take 1 tablet by mouth daily.   nitroGLYCERIN (NITROSTAT) 0.4 MG SL tablet Place 1 tablet (0.4 mg total) under the tongue every 5 (five) minutes x 3 doses as needed for chest pain.   ondansetron (ZOFRAN) 8 MG tablet TAKE ONE TABLET EVERY 8 HOURS AS NEEDED   phenazopyridine (PYRIDIUM) 200 MG tablet Take 1 tablet (200 mg total) by mouth 3 (three) times daily as needed for pain.   [DISCONTINUED] cefdinir (OMNICEF) 300 MG capsule Take 1 capsule (300 mg total) by mouth 2 (two) times daily.   No facility-administered encounter medications on file as of 06/05/2023.           Review of Systems     Objective:   Physical Exam  General-in no acute distress Eyes-no discharge Lungs-respiratory rate normal, CTA CV-no murmurs,RRR Extremities skin warm dry no edema Neuro grossly normal Behavior normal, alert       Assessment & Plan:  1. Type 2 diabetes mellitus with stage 2 chronic kidney disease, without long-term current use of insulin (HCC) Has significant diabetes check A1c before next visit continue current measures - Hemoglobin A1c - Microalbumin/Creatinine Ratio, Urine  2. Primary hypertension Blood pressure doing well continue current medicines - Basic Metabolic Panel  3. Diabetic peripheral neuropathy [facility]) Patient not complaining of severe troubles currently.   She still has some tingling in the feet but no complete numbness  4. Elevated serum creatinine Will monitor kidney function  5. High risk medication use Liver profile - Hepatic Function Panel  6. Hyperlipidemia associated with type 2 diabetes mellitus (HCC) Cholesterol ordered await results - Lipid Panel  7. Hair loss Patient complains of her hair thinning and increased fatigue tiredness check TSH - TSH  8. Anxiousness Mild to moderate anxiousness in the evening she finds herself feeling worked up unable to go to sleep without taking Xanax at nighttime  We did discuss how as she gets older we will want to try to reduce this even further  We also discussed how studies show that pain medication and benzodiazepines are not a good choice together and because of this she agrees that it would be wise to stop the pain medicine so therefore pain medication was stopped today She will  follow-up later this year approximately 4 months

## 2023-06-09 ENCOUNTER — Other Ambulatory Visit: Payer: Self-pay | Admitting: Family Medicine

## 2023-06-09 ENCOUNTER — Other Ambulatory Visit: Payer: Self-pay | Admitting: Cardiovascular Disease

## 2023-06-24 ENCOUNTER — Encounter: Payer: Self-pay | Admitting: Urology

## 2023-06-24 ENCOUNTER — Ambulatory Visit: Payer: 59 | Admitting: Urology

## 2023-06-24 VITALS — BP 164/73 | HR 85 | Temp 97.7°F

## 2023-06-24 DIAGNOSIS — N3281 Overactive bladder: Secondary | ICD-10-CM

## 2023-06-24 DIAGNOSIS — N952 Postmenopausal atrophic vaginitis: Secondary | ICD-10-CM

## 2023-06-24 DIAGNOSIS — N390 Urinary tract infection, site not specified: Secondary | ICD-10-CM

## 2023-06-24 DIAGNOSIS — R3915 Urgency of urination: Secondary | ICD-10-CM

## 2023-06-24 DIAGNOSIS — N3941 Urge incontinence: Secondary | ICD-10-CM

## 2023-06-24 DIAGNOSIS — Z87440 Personal history of urinary (tract) infections: Secondary | ICD-10-CM | POA: Diagnosis not present

## 2023-06-24 DIAGNOSIS — R350 Frequency of micturition: Secondary | ICD-10-CM

## 2023-06-24 LAB — URINALYSIS, ROUTINE W REFLEX MICROSCOPIC
Bilirubin, UA: NEGATIVE
Glucose, UA: NEGATIVE
Ketones, UA: NEGATIVE
Leukocytes,UA: NEGATIVE
Nitrite, UA: NEGATIVE
Protein,UA: NEGATIVE
RBC, UA: NEGATIVE
Specific Gravity, UA: 1.02 (ref 1.005–1.030)
Urobilinogen, Ur: 0.2 mg/dL (ref 0.2–1.0)
pH, UA: 6.5 (ref 5.0–7.5)

## 2023-06-24 LAB — BLADDER SCAN AMB NON-IMAGING: Scan Result: 8

## 2023-06-24 MED ORDER — GEMTESA 75 MG PO TABS
1.0000 | ORAL_TABLET | Freq: Every day | ORAL | 11 refills | Status: DC
Start: 2023-06-24 — End: 2023-09-02

## 2023-06-24 MED ORDER — ESTRADIOL 0.1 MG/GM VA CREA: TOPICAL_CREAM | VAGINAL | 3 refills | Status: DC

## 2023-06-24 NOTE — Progress Notes (Signed)
Name: Elizabeth Aguilar DOB: 1941/06/04 MRN: [id number]  History of Present Illness: Elizabeth Aguilar is a 82 y.o. female who presents today as a new patient at [facility]. All available relevant medical records have been reviewed.   She reports chief complaint of recurrent UTls.   Urine culture results in past 12 months: - 07/02/2022: Negative - 07/30/2022: Positive for Klebsiella oxytoca - 11/06/2022: Positive for E. coli - 05/07/2023: Positive for E. coli  Urinary Symptoms: She reports 3 UTl's in the last year. When present, UTI symptoms include dysuria, fatigue, general malaise, weakness.   She reports urinary frequency, urgency, and urge incontinence at baseline. Wears 1 diaper per day on average. She reports mild caffeine intake (1 cup of coffee each morning).  She denies history of kidney stones.  Vaginal / prolapse Symptoms: She denies vaginal bulge sensation.  She denies seeing a vaginal bulge. She denies vaginal pain, bleeding, or discharge.  She use of topical vaginal estrogen cream.   Past OB/GYN History: OB History   No obstetric history on file.    She is post menopausal.   Fall Screening: Do you usually have a device to assist in your mobility? No   Medications: Current Outpatient Medications  Medication Sig Dispense Refill   estradiol (ESTRACE) 0.1 MG/GM vaginal cream Discard plastic applicator. Insert a blueberry size amount (approximately 1 gram) of cream on fingertip inside vagina at bedtime every night for 1 week then 2 nights per week for long term use. 30 g 3   Vibegron (GEMTESA) 75 MG TABS Take 1 tablet (75 mg total) by mouth daily. 30 tablet 11   albuterol (VENTOLIN HFA) 108 (90 Base) MCG/ACT inhaler USE 2 PUFFS EVERY 6 HOURS AS NEEDED 8.5 g 6   ALPRAZolam (XANAX) 0.5 MG tablet TAKE 1/2 TABLET DURING THE DAY & TAKE 1 TABLET AT BEDTIME AS NEEDED 34 tablet 2   amLODipine (NORVASC) 5 MG tablet TAKE ONE (1) TABLET BY MOUTH EVERY DAY 90  tablet 0   aspirin EC 81 MG EC tablet Take 1 tablet (81 mg total) by mouth daily.     atorvastatin (LIPITOR) 40 MG tablet TAKE 1 TABLET DAILY AT 6PM 90 tablet 3   cetirizine (ZYRTEC) 10 MG tablet TAKE ONE (1) TABLET BY MOUTH EVERY DAY 30 tablet 3   esomeprazole (NEXIUM) 40 MG capsule TAKE ONE (1) CAPSULE EACH DAY 90 capsule 1   ezetimibe (ZETIA) 10 MG tablet TAKE ONE (1) TABLET BY MOUTH EVERY DAY 90 tablet 3   fluticasone (FLONASE) 50 MCG/ACT nasal spray Place 2 sprays into both nostrils daily. 48 g 12   furosemide (LASIX) 20 MG tablet TAKE 2 TABLETS EVERY MORNING UNTIL THE SWELLING GOES DOWN THEN TAKE 1 TABLET EVERY MORNING 90 tablet 1   gabapentin (NEURONTIN) 100 MG capsule TAKE TWO CAPSULES BY MOUTH AT BEDTIME DAILY 60 capsule 5   ketoconazole (NIZORAL) 2 % cream APPLY TWICE DAILY 60 g 6   metFORMIN (GLUCOPHAGE) 500 MG tablet TAKE ONE (1) TABLET BY MOUTH TWO (2) TIMES DAILY 180 tablet 1   metoprolol succinate (TOPROL-XL) 50 MG 24 hr tablet TAKE 1 TABLET IN THE MORNING AND AT BEDTIME WITH FOOD 180 tablet 1   Multiple Vitamin (MULTIVITAMIN WITH MINERALS) TABS tablet Take 1 tablet by mouth daily.     nitroGLYCERIN (NITROSTAT) 0.4 MG SL tablet Place 1 tablet (0.4 mg total) under the tongue every 5 (five) minutes x 3 doses as needed for chest pain. 25 tablet 12  ondansetron (ZOFRAN) 8 MG tablet TAKE ONE TABLET EVERY 8 HOURS AS NEEDED 24 tablet 3   phenazopyridine (PYRIDIUM) 200 MG tablet Take 1 tablet (200 mg total) by mouth 3 (three) times daily as needed for pain. 10 tablet 0   No current facility-administered medications for this visit.    Allergies: Allergies  Allergen Reactions   Augmentin [Amoxicillin-Pot Clavulanate] Other (See Comments)    Reaction:  All over body pain  Has patient had a PCN reaction causing immediate rash, facial/tongue/throat swelling, SOB or lightheadedness with hypotension: No Has patient had a PCN reaction causing severe rash involving mucus membranes or skin  necrosis: No Has patient had a PCN reaction that required hospitalization: No Has patient had a PCN reaction occurring within the last 10 years: Yes If all of the above answers are "NO", then may proceed with Cephalosporin use.   Ceftin [Cefuroxime Axetil] Nausea And Vomiting   Darvocet [Propoxyphene N-Acetaminophen] Nausea And Vomiting   Doxycycline Nausea And Vomiting and Other (See Comments)    Reaction:  All over body pain   Erythromycin Nausea And Vomiting   Levofloxacin Other (See Comments)    Reaction:  All over body pain    Sulfa Antibiotics Nausea And Vomiting and Other (See Comments)    Reaction:  All over body pain    Sulfamethoxazole Other (See Comments)   Vioxx [Rofecoxib] Nausea And Vomiting   Zetia [Ezetimibe] Other (See Comments)    Reaction:  Cramping   Zithromax [Azithromycin] Nausea And Vomiting   Zocor [Simvastatin] Other (See Comments)    Reaction:  Leg cramps     Past Medical History:  Diagnosis Date   CAD (coronary artery disease)    a. s/p STEMI in 2015 with DES to LAD and stgaed PCI with DES to RCA   Chest pain 10/10/2014   Chronic combined systolic and diastolic congestive heart failure (HCC)    a. 03/2017: echo showing mildly reduced EF of 40-45% with Grade 1 DD and mild to moderate MR.    Hyperlipidemia    Irritable bowel syndrome    Myocardial infarction (HCC)    Osteoporosis 04/14/2022   Detected on bone density, Fosamax started, June 2023   Pericarditis    Type 2 diabetes mellitus [facility])    Past Surgical History:  Procedure Laterality Date   CATARACT EXTRACTION W/PHACO Left 09/21/2018   Procedure: CATARACT EXTRACTION PHACO AND INTRAOCULAR LENS PLACEMENT LEFT EYE;  Surgeon: Gemma Payor, MD;  Location: [facility];  Service: Ophthalmology;  Laterality: Left;  CDE: 14.50   CATARACT EXTRACTION W/PHACO Right 10/05/2018   Procedure: CATARACT EXTRACTION PHACO AND INTRAOCULAR LENS PLACEMENT (IOC);  Surgeon: Gemma Payor, MD;  Location: [facility];  Service:  Ophthalmology;  Laterality: Right;  CDE: 11.48   CORONARY ANGIOPLASTY WITH STENT PLACEMENT     LEFT HEART CATHETERIZATION WITH CORONARY ANGIOGRAM N/A 12/16/2013   Procedure: LEFT HEART CATHETERIZATION WITH CORONARY ANGIOGRAM;  Surgeon: Lennette Bihari, MD;  Location: [facility] CATH LAB;  Service: Cardiovascular;  Laterality: N/A;   LEFT HEART CATHETERIZATION WITH CORONARY ANGIOGRAM  12/17/2013   Procedure: LEFT HEART CATHETERIZATION WITH CORONARY ANGIOGRAM;  Surgeon: Lennette Bihari, MD;  Location: [facility] CATH LAB;  Service: Cardiovascular;;   PERCUTANEOUS CORONARY STENT INTERVENTION (PCI-S) N/A 12/17/2013   Procedure: PERCUTANEOUS CORONARY STENT INTERVENTION (PCI-S);  Surgeon: Lennette Bihari, MD;  Location: [facility] CATH LAB;  Service: Cardiovascular;  Laterality: N/A;   Family History  Problem Relation Age of Onset   Diabetes Mother  Diabetes Daughter    Diabetes Son    Social History   Socioeconomic History   Marital status: Widowed    Spouse name: Not on file   Number of children: Not on file   Years of education: Not on file   Highest education level: Not on file  Occupational History   Not on file  Tobacco Use   Smoking status: Never   Smokeless tobacco: Never  Substance and Sexual Activity   Alcohol use: No   Drug use: No   Sexual activity: Not on file  Other Topics Concern   Not on file  Social History Narrative   Not on file   Social Determinants of Health   Financial Resource Strain: Low Risk  (11/08/2022)   Overall Financial Resource Strain (CARDIA)    Difficulty of Paying Living Expenses: Not hard at all  Food Insecurity: No Food Insecurity (11/08/2022)   Hunger Vital Sign    Worried About Running Out of Food in the Last Year: Never true    Ran Out of Food in the Last Year: Never true  Transportation Needs: No Transportation Needs (11/08/2022)   PRAPARE - Administrator, Civil Service (Medical): No    Lack of Transportation (Non-Medical): No  Physical Activity:  Inactive (11/08/2022)   Exercise Vital Sign    Days of Exercise per Week: 0 days    Minutes of Exercise per Session: 0 min  Stress: No Stress Concern Present (11/08/2022)   [employer] of Occupational Health - Occupational Stress Questionnaire    Feeling of Stress : Not at all  Social Connections: Socially Isolated (11/08/2022)   Social Connection and Isolation Panel [NHANES]    Frequency of Communication with Friends and Family: More than three times a week    Frequency of Social Gatherings with Friends and Family: Three times a week    Attends Religious Services: Never    Active Member of Clubs or Organizations: No    Attends Banker Meetings: Never    Marital Status: Widowed  Intimate Partner Violence: Not At Risk (11/08/2022)   Humiliation, Afraid, Rape, and Kick questionnaire    Fear of Current or Ex-Partner: No    Emotionally Abused: No    Physically Abused: No    Sexually Abused: No    SUBJECTIVE  Review of Systems Constitutional: Patient denies any unintentional weight loss or change in strength lntegumentary: Patient denies any rashes or pruritus Cardiovascular: Patient denies chest pain or syncope Respiratory: Patient denies shortness of breath Gastrointestinal: Patient denies nausea, vomiting, constipation, or diarrhea Musculoskeletal: Patient denies muscle cramps or weakness Neurologic: Patient denies convulsions or seizures Psychiatric: Patient denies memory problems Allergic/Immunologic: Patient denies recent allergic reaction(s) Hematologic/Lymphatic: Patient denies bleeding tendencies Endocrine: Patient denies heat/cold intolerance  GU: As per HPI.  OBJECTIVE Vitals:   06/24/23 1058  BP: (!) 164/73  Pulse: 85  Temp: 97.7 F (36.5 C)   There is no height or weight on file to calculate BMI.  Physical Examination Constitutional: No obvious distress; patient is non-toxic appearing  Cardiovascular: No visible lower extremity edema.   Respiratory: The patient does not have audible wheezing/stridor; respirations do not appear labored  Gastrointestinal: Abdomen non-distended Musculoskeletal: Normal ROM of UEs  Skin: No obvious rashes/open sores  Neurologic: CN 2-12 grossly intact Psychiatric: Answered questions appropriately with normal affect  Hematologic/Lymphatic/Immunologic: No obvious bruises or sites of spontaneous bleeding  UA: negative  PVR: 8 ml  ASSESSMENT Recurrent UTI - Plan: Urinalysis, Routine  w reflex microscopic, BLADDER SCAN AMB NON-IMAGING, estradiol (ESTRACE) 0.1 MG/GM vaginal cream  Atrophic vaginitis - Plan: estradiol (ESTRACE) 0.1 MG/GM vaginal cream  OAB (overactive bladder) - Plan: Vibegron (GEMTESA) 75 MG TABS  Urinary urgency - Plan: Vibegron (GEMTESA) 75 MG TABS  Urge incontinence - Plan: Vibegron (GEMTESA) 75 MG TABS  Urinary frequency - Plan: Vibegron (GEMTESA) 75 MG TABS  Recurrent UTls:  We discussed the possible etiologies of recurrent UTls including ascending infection related to intercourse; vaginal atrophy; transmural infection that has been treated incompletely; urinary tract stones; incomplete bladder emptying with urinary stasis; kidney or bladder tumor; urethral diverticulum; and colonization of vagina and urinary tract with pathologic, adherent organisms.   For UTI prevention we discussed options including: Adequate fluid intake (>1.5 liters/day) to flush out the urinary tract. - Go to the bathroom to urinate every 4-6 hours while awake to minimize urinary stasis / bacterial overgrowth in the bladder. - Proanthocyanidin (PAC) supplement 36 mg daily; must be soluble (insoluble form of PAC will be ineffective). Recommend Ellura. Vitamin C supplement Probiotic to maintain healthy vaginal microbiome - Topical vaginal estrogen for vaginal atrophy. The etiology and consequences of urogenital epithelial atrophy was explained to patient. The thinning of the epithelium of the urethra  can contribute to urinary urgency and frequency syndromes. In addition, the normal bacterial flora that colonizes the perineum may contribute to UTI risk because the thin urethral epithelium allows the bacteria to become adherent and the change in vaginal pH can disrupt the vaginal / urethral microbiome and allow for bacterial overgrowth. Patient was advised that topical vaginal estrogen replacement will take about 3 months to restore the vaginal pH and may sting/burn initially due to severe dryness, which will improve with ongoing treatment. OK to have sex with any of the topical vaginal estrogen replacement options. We discussed that there have been studies that evaluate use of low-dose intravaginal estrogen cream that shows minimal systemic absorption that is negligible after 3 weeks. There have been no studies indicating increased risk of contributing to breast cancer development or recurrence.  If recurrent UTls persist, may consider UTI prophylaxis with a daily low dose antibiotic in the future.  Advised pt not to use D-mannose for UTI prevention due to sugar content given patient's history of diabetes.  For management of acute UTI symptoms: - Patient was advised that if/when UTI-like symptoms occur they can call our office to speak with a nurse, who can place an order for urinalysis (with reflex to urine culture). They may then proceed to a [facility] to provide their urine sample. This is required for evaluation in order for their Urology provider to make informed treatment recommendation(s), which may or may not include an antibiotic prescription. - Discussed option to take over-the-counter Pyridium (commonly known under the AZO brand name) for bladder pain. No more than 3 days at a time.  Patient ultimately decided to start with topical vaginal estrogen cream and will consider OTC supplements for UTI prevention. Handout provided.  2. OAB with urinary frequency, urgency, and urge  incontinence. We discussed the symptoms of overactive bladder (OAB), which include urinary urgency, frequency, nocturia, with or without urge incontinence.   While we may not know the exact etiology of OAB, several risk factors can be identified.  - We discussed this patient's neurogenic risk factors for OAB-type symptoms including T2DM. - Likely exacerbated by diuretic use (Lasix).   We discussed the following management options in detail including potential benefits, risks, and side  effects: Behavioral therapy: Modify fluid intake Decreasing bladder irritants (such as caffeine, acidic foods, spicy foods, alcohol) Urge suppression strategies Bladder retraining / timed voiding Double voiding Medication(s): - For anticholinergic medications, we discussed the potential side effects of anticholinergics including dry eyes, dry mouth, constipation, cognitive impairment and urinary retention.  - For beta-3 agonist medication, we discussed the risk for urinary retention and the potential side effect of elevated blood pressure specific to Myrbetriq (which is more likely to occur in individuals with uncontrolled hypertension).   She decided to proceed with Gemtesa 75 mg daily (Myrbetriq not on formulary for her) and to work on behavioral modifications including minimizing caffeine intake and working on timed voiding.   Will plan for follow up in 8 weeks or sooner if needed. Pt verbalized understanding and agreement. All questions were answered.  PLAN Advised the following: 1. Start topical vaginal estrogen cream use 2x/week at bedtime. 2. Start Gemtesa 75 mg daily. 3. Minimize caffeine intake. 4. Work on timed voiding. 5. Return in about 8 weeks (around 08/19/2023) for UA, PVR, & f/u with Evette Georges NP.  Orders Placed This Encounter  Procedures   Urinalysis, Routine w reflex microscopic   BLADDER SCAN AMB NON-IMAGING   It has been explained that the patient is to follow regularly with  their PCP in addition to all other providers involved in their care and to follow instructions provided by these respective offices. Patient advised to contact urology clinic if any urologic-pertaining questions, concerns, new symptoms or problems arise in the interim period.  Patient Instructions  Recommendations regarding UTI prevention / management:  When UTI symptoms occur: Call urology office to request order for urine culture. We recommend waiting for urine culture result prior to use of any antibiotics.  For bladder pain/ burning with urination: Over the counter Pyridium (phenazopyridine) as needed (commonly known under the "AZO" brand). No more than 3 days consecutively at a time due to risk for methemoglobinemia, liver function issues, and bone health damage with long term use of Pyridium.  Routine use for UTI prevention: - Low dose antibiotic daily for UTI prophylaxis. - Topical vaginal estrogen for vaginal atrophy. Adequate fluid intake (>1.5 liters/day) to flush out the urinary tract. - Go to the bathroom to urinate every 4-6 hours while awake to minimize urinary stasis / bacterial overgrowth in the bladder. - Proanthocyanidin (PAC) supplement 36 mg daily; must be soluble (insoluble form of PAC will be ineffective). Recommended brand: Ellura. This is an over-the-counter supplement (often must be found/ purchased online) supplement derived from cranberries with concentrated active component: Proanthocyanidin (PAC) 36 mg daily. Decreases bacterial adherence to bladder lining. Not recommended for patients with interstitial cystitis due to acidity. - Vitamin C supplement to acidify urine to minimize bacterial growth. Not recommended for patients with interstitial cystitis due to acidity. - Probiotic to maintain healthy vaginal microbiome to suppress bacteria at urethral opening. Brand recommendations: Feminine Balance (highest concentration of lactobacillus) or Hyperbiotic Pro 15.  Note  for patients with diabetes: You may read about D-mannose powder for UTI prevention. That is an over the-counter supplement which decreases bacterial adherence to bladder lining. I would NOT advise that for you as a person with diabetes due to its sugar content.      Overactive bladder (OAB) overview for patients:  Symptoms may include: urinary urgency ("gotta go" feeling) urinary frequency (voiding >8 times per day) night time urination (nocturia) urge incontinence of urine (UUI)  While we do not know the exact etiology  of OAB, several treatment options exist including:  Behavioral therapy: Reducing fluid intake Decreasing bladder stimulants (such as caffeine) and irritants (such as acidic food, spicy foods, alcohol) Urge suppression strategies Bladder retraining via timed voiding Pelvic floor physical therapy  Medication(s) - can use one or both of the drug classes below. Anticholinergic / antimuscarinic medications:  Mechanism of action: Activate M3 receptors to reduce detrusor stimulation and increase bladder capacity   (parasympathetic nervous system). Effect: Relaxes the bladder to decrease overactivity, increase bladder storage capacity, and increase time between voids. Onset: Slow acting (may take 8-12 weeks to determine efficacy). Medications include: Vesicare (Solifenacin), Ditropan (Oxybutynin), Detrol (Tolterodine), Toviaz (Fesoterodine), Sanctura (Trospium), Urispas (Flavoxate), Enablex (Darifenacin), Bentyl (Dicyclomine), Levsin (Hyoscyamine ). Potential side effects include but are not limited to: Dry eyes, dry mouth, constipation, cognitive impairment, dementia risk with long term use, and urinary retention/ incomplete bladder emptying. Insurance companies generally prefer for patients to try 1-2 anticholinergic / antimuscarinic medications first due to low cost. Some exceptions are made based on patient-specific comorbidities / risk factors. Beta-3 agonist  medications: Mechanism of action: Stimulates selective B3 adrenergic receptors to cause smooth muscle bladder relaxation (sympathetic nervous system). Effect: Relaxes the bladder to decrease overactivity, increase bladder storage capacity, and increase time between voids. Onset: Slow acting (may take 8-12 weeks to determine efficacy). Medications include: Myrbetriq (Mirabegron) and Vibegron Leslye Peer). Potential side effects include but are not limited to: urinary retention / incomplete bladder emptying and elevated blood pressure (more likely to occur in individuals with pre-existing uncontrolled hypertension). These medications tend to be more expensive than the anticholinergic / antimuscarinic medications.   Electronically signed by:  Donnita Falls, MSN, FNP-C, CUNP 06/24/2023 11:35 AM

## 2023-06-24 NOTE — Patient Instructions (Addendum)
Recommendations regarding UTI prevention / management:  When UTI symptoms occur: Call urology office to request order for urine culture. We recommend waiting for urine culture result prior to use of any antibiotics.  For bladder pain/ burning with urination: Over the counter Pyridium (phenazopyridine) as needed (commonly known under the "AZO" brand). No more than 3 days consecutively at a time due to risk for methemoglobinemia, liver function issues, and bone health damage with long term use of Pyridium.  Routine use for UTI prevention: - Low dose antibiotic daily for UTI prophylaxis. - Topical vaginal estrogen for vaginal atrophy. Adequate fluid intake (>1.5 liters/day) to flush out the urinary tract. - Go to the bathroom to urinate every 4-6 hours while awake to minimize urinary stasis / bacterial overgrowth in the bladder. - Proanthocyanidin (PAC) supplement 36 mg daily; must be soluble (insoluble form of PAC will be ineffective). Recommended brand: Ellura. This is an over-the-counter supplement (often must be found/ purchased online) supplement derived from cranberries with concentrated active component: Proanthocyanidin (PAC) 36 mg daily. Decreases bacterial adherence to bladder lining. Not recommended for patients with interstitial cystitis due to acidity. - Vitamin C supplement to acidify urine to minimize bacterial growth. Not recommended for patients with interstitial cystitis due to acidity. - Probiotic to maintain healthy vaginal microbiome to suppress bacteria at urethral opening. Brand recommendations: Feminine Balance (highest concentration of lactobacillus) or Hyperbiotic Pro 15.  Note for patients with diabetes: You may read about D-mannose powder for UTI prevention. That is an over the-counter supplement which decreases bacterial adherence to bladder lining. I would NOT advise that for you as a person with diabetes due to its sugar content.      Overactive bladder (OAB)  overview for patients:  Symptoms may include: urinary urgency ("gotta go" feeling) urinary frequency (voiding >8 times per day) night time urination (nocturia) urge incontinence of urine (UUI)  While we do not know the exact etiology of OAB, several treatment options exist including:  Behavioral therapy: Reducing fluid intake Decreasing bladder stimulants (such as caffeine) and irritants (such as acidic food, spicy foods, alcohol) Urge suppression strategies Bladder retraining via timed voiding Pelvic floor physical therapy  Medication(s) - can use one or both of the drug classes below. Anticholinergic / antimuscarinic medications:  Mechanism of action: Activate M3 receptors to reduce detrusor stimulation and increase bladder capacity  (parasympathetic nervous system). Effect: Relaxes the bladder to decrease overactivity, increase bladder storage capacity, and increase time between voids. Onset: Slow acting (may take 8-12 weeks to determine efficacy). Medications include: Vesicare (Solifenacin), Ditropan (Oxybutynin), Detrol (Tolterodine), Toviaz (Fesoterodine), Sanctura (Trospium), Urispas (Flavoxate), Enablex (Darifenacin), Bentyl (Dicyclomine), Levsin (Hyoscyamine ). Potential side effects include but are not limited to: Dry eyes, dry mouth, constipation, cognitive impairment, dementia risk with long term use, and urinary retention/ incomplete bladder emptying. Insurance companies generally prefer for patients to try 1-2 anticholinergic / antimuscarinic medications first due to low cost. Some exceptions are made based on patient-specific comorbidities / risk factors. Beta-3 agonist medications: Mechanism of action: Stimulates selective B3 adrenergic receptors to cause smooth muscle bladder relaxation (sympathetic nervous system). Effect: Relaxes the bladder to decrease overactivity, increase bladder storage capacity, and increase time between voids. Onset: Slow acting (may take 8-12  weeks to determine efficacy). Medications include: Myrbetriq (Mirabegron) and Vibegron Leslye Peer). Potential side effects include but are not limited to: urinary retention / incomplete bladder emptying and elevated blood pressure (more likely to occur in individuals with pre-existing uncontrolled hypertension). These medications tend to be more  expensive than the anticholinergic / antimuscarinic medications.

## 2023-06-24 NOTE — Progress Notes (Signed)
post void residual=8 

## 2023-07-07 ENCOUNTER — Other Ambulatory Visit: Payer: Self-pay | Admitting: Cardiovascular Disease

## 2023-07-07 ENCOUNTER — Other Ambulatory Visit: Payer: Self-pay | Admitting: Family Medicine

## 2023-07-07 DIAGNOSIS — J189 Pneumonia, unspecified organism: Secondary | ICD-10-CM

## 2023-08-13 NOTE — Progress Notes (Signed)
Name: Elizabeth Aguilar DOB: [birth year] MRN: [id number]  History of Present Illness: Elizabeth Aguilar is a 82 y.o. female who presents today for follow up visit at [facility] [place]. - GU history: 1. Recurrent UTI. 2. OAB with urinary frequency, urgency, and urge incontinence.  Urine culture results in past 12 months: - 07/02/2022: Negative - 07/30/2022: Positive for Klebsiella oxytoca - 11/06/2022: Positive for E. coli - 05/07/2023: Positive for E. coli  At initial visit on 06/24/2023: The plan was: 1. Start topical vaginal estrogen cream use 2x/week at bedtime. 2. Start Gemtesa 75 mg daily. 3. Minimize caffeine intake. 4. Work on timed voiding. 5. F/u in 8 weeks for recheck.  Today: She reports some mild symptomatic improvement since starting Gemtesa (Vibegron) 75 mg daily. Reports ongoing bothersome urinary frequency, urgency, and urge incontinence. Still leaking at least 1x/day on average - mostly if she's away from home. She is still using  1 diaper per day on average.  She denies dysuria, gross hematuria, straining to void, or sensations of incomplete emptying.  Reports bilateral flank / low back pain for the past 3 days, which she states seems to be resolved today.  She has been using vaginal estrogen cream at a frequency of once per day. She denies vaginal pain, bleeding, discharge. Reports some mild external vulvar irritation when urine touches that area.    Fall Screening: Do you usually have a device to assist in your mobility? No   Medications: Current Outpatient Medications  Medication Sig Dispense Refill   albuterol (VENTOLIN HFA) 108 (90 Base) MCG/ACT inhaler USE 2 PUFFS EVERY 6 HOURS AS NEEDED 8.5 g 6   ALPRAZolam (XANAX) 0.5 MG tablet TAKE 1/2 TABLET DURING THE DAY & TAKE 1 TABLET AT BEDTIME AS NEEDED 34 tablet 2   amLODipine (NORVASC) 5 MG tablet TAKE ONE (1) TABLET BY MOUTH EVERY DAY 90 tablet 0   aspirin EC 81 MG EC tablet Take 1 tablet (81 mg total)  by mouth daily.     atorvastatin (LIPITOR) 40 MG tablet TAKE 1 TABLET DAILY AT 6PM 90 tablet 3   cetirizine (ZYRTEC) 10 MG tablet TAKE ONE (1) TABLET BY MOUTH EVERY DAY 30 tablet 3   esomeprazole (NEXIUM) 40 MG capsule TAKE ONE (1) CAPSULE EACH DAY 90 capsule 1   estradiol (ESTRACE) 0.1 MG/GM vaginal cream Discard plastic applicator. Insert a blueberry size amount (approximately 1 gram) of cream on fingertip inside vagina at bedtime every night for 1 week then 2 nights per week for long term use. 30 g 3   ezetimibe (ZETIA) 10 MG tablet TAKE ONE (1) TABLET BY MOUTH EVERY DAY 90 tablet 3   fluticasone (FLONASE) 50 MCG/ACT nasal spray Place 2 sprays into both nostrils daily. 48 g 12   furosemide (LASIX) 20 MG tablet TAKE 2 TABLETS EVERY MORNING UNTIL THE SWELLING GOES DOWN THEN TAKE 1 TABLET EVERY MORNING 90 tablet 1   gabapentin (NEURONTIN) 100 MG capsule TAKE TWO CAPSULES BY MOUTH AT BEDTIME DAILY 60 capsule 5   ketoconazole (NIZORAL) 2 % cream APPLY TWICE DAILY 60 g 6   metFORMIN (GLUCOPHAGE) 500 MG tablet TAKE ONE (1) TABLET BY MOUTH TWO (2) TIMES DAILY 180 tablet 1   metoprolol succinate (TOPROL-XL) 50 MG 24 hr tablet TAKE 1 TABLET IN THE MORNING AND AT BEDTIME WITH FOOD 180 tablet 1   Multiple Vitamin (MULTIVITAMIN WITH MINERALS) TABS tablet Take 1 tablet by mouth daily.     nitroGLYCERIN (NITROSTAT) 0.4 MG SL  tablet Place 1 tablet (0.4 mg total) under the tongue every 5 (five) minutes x 3 doses as needed for chest pain. 25 tablet 12   ondansetron (ZOFRAN) 8 MG tablet TAKE ONE TABLET EVERY 8 HOURS AS NEEDED 24 tablet 3   phenazopyridine (PYRIDIUM) 200 MG tablet Take 1 tablet (200 mg total) by mouth 3 (three) times daily as needed for pain. 10 tablet 0   Vibegron (GEMTESA) 75 MG TABS Take 1 tablet (75 mg total) by mouth daily. 30 tablet 11   No current facility-administered medications for this visit.    Allergies: Allergies  Allergen Reactions   Augmentin [Amoxicillin-Pot Clavulanate]  Other (See Comments)    Reaction:  All over body pain  Has patient had a PCN reaction causing immediate rash, facial/tongue/throat swelling, SOB or lightheadedness with hypotension: No Has patient had a PCN reaction causing severe rash involving mucus membranes or skin necrosis: No Has patient had a PCN reaction that required hospitalization: No Has patient had a PCN reaction occurring within the last 10 years: Yes If all of the above answers are "NO", then may proceed with Cephalosporin use.   Ceftin [Cefuroxime Axetil] Nausea And Vomiting   Darvocet [Propoxyphene N-Acetaminophen] Nausea And Vomiting   Doxycycline Nausea And Vomiting and Other (See Comments)    Reaction:  All over body pain   Erythromycin Nausea And Vomiting   Levofloxacin Other (See Comments)    Reaction:  All over body pain    Sulfa Antibiotics Nausea And Vomiting and Other (See Comments)    Reaction:  All over body pain    Sulfamethoxazole Other (See Comments)   Vioxx [Rofecoxib] Nausea And Vomiting   Zetia [Ezetimibe] Other (See Comments)    Reaction:  Cramping   Zithromax [Azithromycin] Nausea And Vomiting   Zocor [Simvastatin] Other (See Comments)    Reaction:  Leg cramps     Past Medical History:  Diagnosis Date   CAD (coronary artery disease)    a. s/p STEMI in 2015 with DES to LAD and stgaed PCI with DES to RCA   Chest pain 10/10/2014   Chronic combined systolic and diastolic congestive heart failure (HCC)    a. 03/2017: echo showing mildly reduced EF of 40-45% with Grade 1 DD and mild to moderate MR.    Hyperlipidemia    Irritable bowel syndrome    Myocardial infarction (HCC)    Osteoporosis 04/14/2022   Detected on bone density, Fosamax started, June 2023   Pericarditis    Type 2 diabetes mellitus [facility])    Past Surgical History:  Procedure Laterality Date   CATARACT EXTRACTION W/PHACO Left 09/21/2018   Procedure: CATARACT EXTRACTION PHACO AND INTRAOCULAR LENS PLACEMENT LEFT EYE;  Surgeon: Gemma Payor, MD;  Location: [facility];  Service: Ophthalmology;  Laterality: Left;  CDE: 14.50   CATARACT EXTRACTION W/PHACO Right 10/05/2018   Procedure: CATARACT EXTRACTION PHACO AND INTRAOCULAR LENS PLACEMENT (IOC);  Surgeon: Gemma Payor, MD;  Location: [facility];  Service: Ophthalmology;  Laterality: Right;  CDE: 11.48   CORONARY ANGIOPLASTY WITH STENT PLACEMENT     LEFT HEART CATHETERIZATION WITH CORONARY ANGIOGRAM N/A 12/16/2013   Procedure: LEFT HEART CATHETERIZATION WITH CORONARY ANGIOGRAM;  Surgeon: Lennette Bihari, MD;  Location: [facility] CATH LAB;  Service: Cardiovascular;  Laterality: N/A;   LEFT HEART CATHETERIZATION WITH CORONARY ANGIOGRAM  12/17/2013   Procedure: LEFT HEART CATHETERIZATION WITH CORONARY ANGIOGRAM;  Surgeon: Lennette Bihari, MD;  Location: [facility] CATH LAB;  Service: Cardiovascular;;   PERCUTANEOUS CORONARY STENT  INTERVENTION (PCI-S) N/A 12/17/2013   Procedure: PERCUTANEOUS CORONARY STENT INTERVENTION (PCI-S);  Surgeon: Lennette Bihari, MD;  Location: [facility] CATH LAB;  Service: Cardiovascular;  Laterality: N/A;   Family History  Problem Relation Age of Onset   Diabetes Mother    Diabetes Daughter    Diabetes Son    Social History   Socioeconomic History   Marital status: Widowed    Spouse name: Not on file   Number of children: Not on file   Years of education: Not on file   Highest education level: Not on file  Occupational History   Not on file  Tobacco Use   Smoking status: Never   Smokeless tobacco: Never  Substance and Sexual Activity   Alcohol use: No   Drug use: No   Sexual activity: Not on file  Other Topics Concern   Not on file  Social History Narrative   Not on file   Social Determinants of Health   Financial Resource Strain: Low Risk  (11/08/2022)   Overall Financial Resource Strain (CARDIA)    Difficulty of Paying Living Expenses: Not hard at all  Food Insecurity: No Food Insecurity (11/08/2022)   Hunger Vital Sign    Worried About Running Out of Food in the Last  Year: Never true    Ran Out of Food in the Last Year: Never true  Transportation Needs: No Transportation Needs (11/08/2022)   PRAPARE - Administrator, Civil Service (Medical): No    Lack of Transportation (Non-Medical): No  Physical Activity: Inactive (11/08/2022)   Exercise Vital Sign    Days of Exercise per Week: 0 days    Minutes of Exercise per Session: 0 min  Stress: No Stress Concern Present (11/08/2022)   [employer] of Occupational Health - Occupational Stress Questionnaire    Feeling of Stress : Not at all  Social Connections: Socially Isolated (11/08/2022)   Social Connection and Isolation Panel [NHANES]    Frequency of Communication with Friends and Family: More than three times a week    Frequency of Social Gatherings with Friends and Family: Three times a week    Attends Religious Services: Never    Active Member of Clubs or Organizations: No    Attends Banker Meetings: Never    Marital Status: Widowed  Intimate Partner Violence: Not At Risk (11/08/2022)   Humiliation, Afraid, Rape, and Kick questionnaire    Fear of Current or Ex-Partner: No    Emotionally Abused: No    Physically Abused: No    Sexually Abused: No    Review of Systems Constitutional: Patient denies any unintentional weight loss or change in strength lntegumentary: Patient denies any rashes or pruritus Cardiovascular: Patient denies chest pain or syncope Respiratory: Patient denies shortness of breath Gastrointestinal: Patient denies nausea, vomiting, constipation, or diarrhea Musculoskeletal: Patient denies muscle cramps or weakness Neurologic: Patient denies convulsions or seizures Allergic/Immunologic: Patient denies recent allergic reaction(s) Hematologic/Lymphatic: Patient denies bleeding tendencies Endocrine: Patient denies heat/cold intolerance  GU: As per HPI.  OBJECTIVE Vitals:   08/19/23 0814  BP: (!) 167/83  Pulse: 99  Temp: 97.7 F (36.5 C)    There is no height or weight on file to calculate BMI.  Physical Examination Constitutional: No obvious distress; patient is non-toxic appearing  Cardiovascular: No visible lower extremity edema.  Respiratory: The patient does not have audible wheezing/stridor; respirations do not appear labored  Gastrointestinal: Abdomen non-distended Musculoskeletal: Normal ROM of UEs  Skin: No obvious rashes/open  sores  Neurologic: CN 2-12 grossly intact Psychiatric: Answered questions appropriately with normal affect  Hematologic/Lymphatic/Immunologic: No obvious bruises or sites of spontaneous bleeding  UA: 6-10 WBC/hpf, 0-2 RBC/hpf, bacteria (moderate) PVR: 59 ml  ASSESSMENT Recurrent UTI - Plan: Urinalysis, Routine w reflex microscopic, BLADDER SCAN AMB NON-IMAGING  Atrophic vaginitis  OAB (overactive bladder)  Urge incontinence  We discussed the symptoms of overactive bladder (OAB), which include urinary urgency, frequency, nocturia, with or without urge incontinence.   While we may not know the exact etiology of OAB, several risk factors can be identified.  - We discussed this patient's neurogenic risk factors for OAB-type symptoms including T2DM. - Likely exacerbated by diuretic use (Lasix)  We discussed the following management options in detail including potential benefits, risks, and side effects: Behavioral therapy: Modify fluid intake Decreasing bladder irritants (such as caffeine) Urge suppression strategies Bladder retraining / timed voiding Double voiding Medication(s): - For anticholinergic medications, we discussed the potential side effects of anticholinergics including dry eyes, dry mouth, constipation, cognitive impairment and urinary retention.  - For beta-3 agonist medication, we discussed the risk for urinary retention and the potential side effect of elevated blood pressure specific to Myrbetriq (which is more likely to occur in individuals with uncontrolled  hypertension).  For refractory cases: PTNS (posterior tibial nerve stimulation) Sacral neuromodulation trial (Medtronic lnterStim or Axonics implant) Bladder Botox injections In extreme cases, SP tube placement  She decided to continue on current treatment regimen with Gemtesa 75 mg daily and will continue minimizing caffeine intake.  UA today appears abnormal however patient is asymptomatic for UTI, therefore acute antibiotic treatment is not advised at this time. The rationale for this was discussed with the patient. We discussed the difference between asymptomatic bacteriuria versus acutely symptomatic UTI. We discussed antibiotic stewardship and goal to minimize risk for developing antibiotic resistance.  Will plan for follow up in 6 months or sooner if needed. Pt verbalized understanding and agreement. All questions were answered.  PLAN Advised the following: 1. Continue Gemtesa 75 mg. 2. Continue topical vaginal estrogen cream use. 3. Return in about 6 months (around 02/17/2024) for UA, PVR, & f/u with Evette Georges NP.  Orders Placed This Encounter  Procedures   Urinalysis, Routine w reflex microscopic   BLADDER SCAN AMB NON-IMAGING    It has been explained that the patient is to follow regularly with their PCP in addition to all other providers involved in their care and to follow instructions provided by these respective offices. Patient advised to contact urology clinic if any urologic-pertaining questions, concerns, new symptoms or problems arise in the interim period.  Patient Instructions        Recommendations regarding UTI prevention / management:  When UTI symptoms occur: Call urology office to request order for urine culture. We recommend waiting for urine culture result prior to use of any antibiotics.  For bladder pain/ burning with urination: Over the counter Pyridium (phenazopyridine) as needed (commonly known under the "AZO" brand). No more than 3 days  consecutively at a time due to risk for methemoglobinemia, liver function issues, and bone health damage with long term use of Pyridium.  Routine use for UTI prevention: - Topical vaginal estrogen for vaginal atrophy. Adequate fluid intake (>1.5 liters/day) to flush out the urinary tract. - Go to the bathroom to urinate every 4-6 hours while awake to minimize urinary stasis / bacterial overgrowth in the bladder. - Proanthocyanidin (PAC) supplement 36 mg daily; must be soluble (insoluble form of PAC  will be ineffective). Recommended brand: Ellura. This is an over-the-counter supplement (often must be found/ purchased online) supplement derived from cranberries with concentrated active component: Proanthocyanidin (PAC) 36 mg daily. Decreases bacterial adherence to bladder lining. Not recommended for patients with interstitial cystitis due to acidity. - Vitamin C supplement to acidify urine to minimize bacterial growth. Not recommended for patients with interstitial cystitis due to acidity. - Probiotic to maintain healthy vaginal microbiome to suppress bacteria at urethral opening. Brand recommendations: Feminine Balance (highest concentration of lactobacillus) or Hyperbiotic Pro 15.  Note for patients with diabetes: You may read about D-mannose powder for UTI prevention. That is an over the-counter supplement which decreases bacterial adherence to bladder lining. I would NOT advise that for you as a person with diabetes due to its sugar content.   Electronically signed by:  Donnita Falls, FNP   08/19/23    8:58 AM

## 2023-08-19 ENCOUNTER — Ambulatory Visit (INDEPENDENT_AMBULATORY_CARE_PROVIDER_SITE_OTHER): Payer: 59 | Admitting: Urology

## 2023-08-19 ENCOUNTER — Encounter: Payer: Self-pay | Admitting: Urology

## 2023-08-19 VITALS — BP 167/83 | HR 99 | Temp 97.7°F

## 2023-08-19 DIAGNOSIS — N3941 Urge incontinence: Secondary | ICD-10-CM | POA: Diagnosis not present

## 2023-08-19 DIAGNOSIS — B349 Viral infection, unspecified: Secondary | ICD-10-CM | POA: Diagnosis not present

## 2023-08-19 DIAGNOSIS — Z87440 Personal history of urinary (tract) infections: Secondary | ICD-10-CM

## 2023-08-19 DIAGNOSIS — N390 Urinary tract infection, site not specified: Secondary | ICD-10-CM | POA: Diagnosis not present

## 2023-08-19 DIAGNOSIS — N952 Postmenopausal atrophic vaginitis: Secondary | ICD-10-CM

## 2023-08-19 DIAGNOSIS — N3281 Overactive bladder: Secondary | ICD-10-CM

## 2023-08-19 DIAGNOSIS — J029 Acute pharyngitis, unspecified: Secondary | ICD-10-CM | POA: Diagnosis not present

## 2023-08-19 LAB — MICROSCOPIC EXAMINATION

## 2023-08-19 LAB — URINALYSIS, ROUTINE W REFLEX MICROSCOPIC
Bilirubin, UA: NEGATIVE
Glucose, UA: NEGATIVE
Ketones, UA: NEGATIVE
Nitrite, UA: NEGATIVE
Protein,UA: NEGATIVE
RBC, UA: NEGATIVE
Specific Gravity, UA: <1.005 — ABNORMAL LOW (ref 1.005–1.030)
Urobilinogen, Ur: 0.2 mg/dL (ref 0.2–1.0)
pH, UA: 6 (ref 5.0–7.5)

## 2023-08-19 NOTE — Patient Instructions (Signed)
Recommendations regarding UTI prevention / management:  When UTI symptoms occur: Call urology office to request order for urine culture. We recommend waiting for urine culture result prior to use of any antibiotics.   For bladder pain/ burning with urination: Over the counter Pyridium (phenazopyridine) as needed (commonly known under the "AZO" brand). No more than 3 days consecutively at a time due to risk for methemoglobinemia, liver function issues, and bone health damage with long term use of Pyridium.  Routine use for UTI prevention: - Topical vaginal estrogen for vaginal atrophy. Adequate fluid intake {>1.5 liters/day) to flush out the urinary tract. - Go to the bathroom to urinate every 4-6 hours while awake to minimize urinary stasis / bacterial overgrowth in the bladder. - Proanthocyanidin (PAC) supplement 36 mg daily; must be soluble (insoluble form of PAC will be ineffective). Recommended brand: Ellura. This is an over-the-counter supplement (often must be found/ purchased online) supplement derived from cranberries with concentrated active component: Proanthocyanidin (PAC) 36 mg daily. Decreases bacterial adherence to bladder lining. Not recommended for patients with interstitial cystitis due to acidity. - Vitamin C supplement to acidify urine to minimize bacterial growth. Not recommended for patients with interstitial cystitis due to acidity. - Probiotic to maintain healthy vaginal microbiome to suppress bacteria at urethral opening. Brand recommendations: Feminine Balance (highest concentration of lactobacillus) or Hyperbiotic Pro 15.  Note for patients with diabetes: You may read about D-mannose powder for UTI prevention. That is an over the-counter supplement which decreases bacterial adherence to bladder lining. I would NOT advise that for you as a person with diabetes due to its sugar content.  

## 2023-08-19 NOTE — Progress Notes (Signed)
post void residual=59 

## 2023-08-21 ENCOUNTER — Ambulatory Visit: Payer: 59 | Admitting: Nurse Practitioner

## 2023-08-21 VITALS — BP 150/89 | HR 90 | Temp 97.9°F | Ht 62.45 in | Wt 159.6 lb

## 2023-08-21 DIAGNOSIS — J019 Acute sinusitis, unspecified: Secondary | ICD-10-CM | POA: Diagnosis not present

## 2023-08-21 MED ORDER — AMOXICILLIN 500 MG PO TABS: 500.0000 mg | ORAL_TABLET | Freq: Three times a day (TID) | ORAL | 0 refills | Status: DC

## 2023-08-22 ENCOUNTER — Encounter: Payer: Self-pay | Admitting: Nurse Practitioner

## 2023-08-22 NOTE — Progress Notes (Signed)
Subjective:    Patient ID: Elizabeth Aguilar, female    DOB: 1941/02/09, 82 y.o.   MRN: [id number]  HPI Presents with complaints of sinus symptoms that began on 10/4.  No fever.  Occasional cough mainly due to postnasal drainage.  Sneezing.  Sore throat.  Now producing yellowish mucus.  Noted a "rattle" in her chest 2 days ago but this has resolved.  Sinus pressure/frontal area headache.  Was seen at urgent care on 08/19/2023 with a negative rapid strep test.  No medications were given at that time.  Has been using Flonase, Tylenol, and gargling with warm water.  Taking some fluids.  Voiding normal limit.   Review of Systems  Constitutional:  Negative for fever.  HENT:  Positive for congestion, postnasal drip, sinus pressure and sore throat. Negative for ear pain.   Respiratory:  Positive for cough. Negative for chest tightness, shortness of breath and wheezing.   Cardiovascular:  Negative for chest pain.       Objective:   Physical Exam NAD.  Alert, oriented.  TMs mildly retracted bilaterally, no erythema.  Nasal mucosa clear.  Pharynx mildly injected.  Mucous membranes moist.  Neck supple with mild soft anterior adenopathy.  Lungs clear.  No tachypnea.  Heart regular rate rhythm. Today's Vitals   08/21/23 1612  BP: (!) 150/89  Pulse: 90  Temp: 97.9 F (36.6 C)  SpO2: 95%  Weight: 159 lb 9.6 oz (72.4 kg)  Height: 5' 2.45" (1.586 m)   Body mass index is 28.77 kg/m.        Assessment & Plan:  Acute rhinosinusitis Meds ordered this encounter  Medications   amoxicillin (AMOXIL) 500 MG tablet    Sig: Take 1 tablet (500 mg total) by mouth 3 (three) times daily. X 7 days    Dispense:  21 tablet    Refill:  0    Order Specific Question:   Supervising Provider    Answer:   Lilyan Punt A [[id number]]   Continue Flonase as directed.  Given samples of Mucinex DM.  Increase clear fluid intake. Warning signs reviewed.  Call back next week if no improvement, seek help sooner if worse.

## 2023-08-27 ENCOUNTER — Telehealth: Payer: Self-pay | Admitting: Nurse Practitioner

## 2023-08-27 ENCOUNTER — Other Ambulatory Visit: Payer: Self-pay | Admitting: Nurse Practitioner

## 2023-08-27 MED ORDER — FLUCONAZOLE 150 MG PO TABS
ORAL_TABLET | ORAL | 0 refills | Status: DC
Start: 2023-08-27 — End: 2023-09-04

## 2023-08-27 NOTE — Telephone Encounter (Signed)
Patient was seen last week and got antibiotic and now needs something for yeast infection [facility] in [place]

## 2023-09-02 ENCOUNTER — Other Ambulatory Visit: Payer: Self-pay | Admitting: Cardiovascular Disease

## 2023-09-02 ENCOUNTER — Other Ambulatory Visit: Payer: Self-pay | Admitting: Nurse Practitioner

## 2023-09-02 ENCOUNTER — Ambulatory Visit: Payer: 59 | Attending: Cardiovascular Disease | Admitting: Cardiovascular Disease

## 2023-09-02 ENCOUNTER — Other Ambulatory Visit: Payer: Self-pay | Admitting: Family Medicine

## 2023-09-02 ENCOUNTER — Encounter: Payer: Self-pay | Admitting: Cardiovascular Disease

## 2023-09-02 ENCOUNTER — Telehealth: Payer: Self-pay | Admitting: Family Medicine

## 2023-09-02 VITALS — BP 146/80 | HR 100 | Ht 63.0 in | Wt 160.0 lb

## 2023-09-02 DIAGNOSIS — R000 Tachycardia, unspecified: Secondary | ICD-10-CM

## 2023-09-02 DIAGNOSIS — I2510 Atherosclerotic heart disease of native coronary artery without angina pectoris: Secondary | ICD-10-CM

## 2023-09-02 DIAGNOSIS — I10 Essential (primary) hypertension: Secondary | ICD-10-CM

## 2023-09-02 DIAGNOSIS — E785 Hyperlipidemia, unspecified: Secondary | ICD-10-CM | POA: Diagnosis not present

## 2023-09-02 DIAGNOSIS — I493 Ventricular premature depolarization: Secondary | ICD-10-CM

## 2023-09-02 DIAGNOSIS — Z9861 Coronary angioplasty status: Secondary | ICD-10-CM | POA: Diagnosis not present

## 2023-09-02 DIAGNOSIS — I2102 ST elevation (STEMI) myocardial infarction involving left anterior descending coronary artery: Secondary | ICD-10-CM

## 2023-09-02 DIAGNOSIS — E119 Type 2 diabetes mellitus without complications: Secondary | ICD-10-CM

## 2023-09-02 MED ORDER — METOPROLOL SUCCINATE ER 50 MG PO TB24: ORAL_TABLET | ORAL | 2 refills | Status: DC
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
## 2023-09-02 NOTE — Patient Instructions (Signed)
Medication Instructions:  Your physician has recommended you make the following change in your medication:   -Increase metoprolol succinate (Toprol-XL) to 75mg  in the morning and 50mg  in the evening.  *If you need a refill on your cardiac medications before your next appointment, please call your pharmacy*   Follow-Up: At [facility], you and your health needs are our priority.  As part of our continuing mission to provide you with exceptional heart care, we have created designated Provider Care Teams.  These Care Teams include your primary Cardiologist (physician) and Advanced Practice Providers (APPs -  Physician Assistants and Nurse Practitioners) who all work together to provide you with the care you need, when you need it.  We recommend signing up for the patient portal called "MyChart".  Sign up information is provided on this After Visit Summary.  MyChart is used to connect with patients for Virtual Visits (Telemedicine).  Patients are able to view lab/test results, encounter notes, upcoming appointments, etc.  Non-urgent messages can be sent to your provider as well.   To learn more about what you can do with MyChart, go to [email or web address].    Your next appointment:   6 month(s)  Provider:   Nicki Guadalajara, MD

## 2023-09-02 NOTE — Telephone Encounter (Signed)
Patient was seen on 10/10 and was given an antibiotic she is now requesting something for a yeast infection to be called into the drug store in [place].  CB# [phone]

## 2023-09-07 ENCOUNTER — Encounter: Payer: Self-pay | Admitting: Cardiovascular Disease

## 2023-09-24 ENCOUNTER — Ambulatory Visit: Payer: 59 | Admitting: Family Medicine

## 2023-09-24 ENCOUNTER — Encounter: Payer: Self-pay | Admitting: Family Medicine

## 2023-09-24 VITALS — BP 122/70 | Ht 63.0 in | Wt 162.0 lb

## 2023-09-24 DIAGNOSIS — E1142 Type 2 diabetes mellitus with diabetic polyneuropathy: Secondary | ICD-10-CM

## 2023-09-24 DIAGNOSIS — N182 Chronic kidney disease, stage 2 (mild): Secondary | ICD-10-CM

## 2023-09-24 DIAGNOSIS — E785 Hyperlipidemia, unspecified: Secondary | ICD-10-CM

## 2023-09-24 DIAGNOSIS — E1169 Type 2 diabetes mellitus with other specified complication: Secondary | ICD-10-CM

## 2023-09-24 DIAGNOSIS — I10 Essential (primary) hypertension: Secondary | ICD-10-CM | POA: Diagnosis not present

## 2023-09-24 DIAGNOSIS — F419 Anxiety disorder, unspecified: Secondary | ICD-10-CM

## 2023-09-24 DIAGNOSIS — E1122 Type 2 diabetes mellitus with diabetic chronic kidney disease: Secondary | ICD-10-CM | POA: Diagnosis not present

## 2023-09-24 NOTE — Progress Notes (Signed)
Subjective:    Patient ID: Elizabeth Aguilar, female    DOB: 12/09/1940, 82 y.o.   MRN: [id number]  Discussed the use of AI scribe software for clinical note transcription with the patient, who gave verbal consent to proceed.  History of Present Illness   The patient, with a history of back pain and neuropathy, presents for a routine check-up. She has been busy with childcare and has not yet received her annual flu shot. She reports adherence to her current medication regimen and is managing her back pain with Tylenol, taken once daily at bedtime. She denies any recent use of hydrocodone. Her mood is reported as stable, with no significant anxiety, attributing her busy lifestyle as a distraction from any potential stressors.  The patient reports occasional reflux, which is well-controlled with medication. She is currently on gabapentin for neuropathy, which she reports as causing burning between her toes at night. She has found relief by applying Vaseline and wrapping her toes with a paper towel. She expresses interest in potentially discontinuing gabapentin and managing her symptoms with this method.  The patient's mobility is not significantly impacted by her back pain, and she reports no issues with her breathing. 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 She is due for routine blood work, including cholesterol, diabetes, kidney, liver, thyroid, and a urine test to check for protein. She reports a recent visit to a kidney specialist due to high blood pressure, which resulted in an increase in her medication.     The patient was seen today as part of a comprehensive diabetic check up. Patient has diabetes Patient relates good compliance with taking the medication. We discussed their diet and exercise activities  We also discussed the importance of notifying us if any excessively high glucoses or  low sugars.    Patient here for follow-up regarding cholesterol.    Patient relates taking medication on a regular basis Denies problems with medication Importance of dietary measures discussed Regular lab work regarding lipid and liver was checked and if needing additional labs was appropriately ordered  Patient for blood pressure check up.  The patient does have hypertension.   Patient relates dietary measures try to minimize salt The importance of healthy diet and activity were discussed Patient relates compliance  Patient states she takes her thyroid medicine.    Review of Systems     Objective:    Physical Exam   VITALS: BP- 122/70 CHEST: Lungs clear to auscultation. CARDIOVASCULAR: Heart sounds normal on auscultation. EXTREMITIES: Ankle non-tender on palpation.           Assessment & Plan:  Assessment and Plan    Influenza Vaccination Not yet received for the current year. -Recommended to receive influenza vaccine either at the clinic or at a local pharmacy.  Peripheral Neuropathy Reports burning sensation between toes, currently managed with topical Vaseline and wrapping. On gabapentin, but considering discontinuation. -Continue current management and consider discontinuing gabapentin if symptoms remain controlled.  Gastroesophageal Reflux Disease (GERD) Reports symptoms if medication is not taken. -Continue current medication.  Anxiety Managed with daily alprazolam. -Continue alprazolam as prescribed.  Hypertension Reports recent increase in antihypertensive medication by nephrologist. -Continue current medication regimen.  General Health Maintenance -Complete blood work including cholesterol, diabetes, kidney, liver, thyroid, and urine protein tests within the next three weeks. -Follow-up appointment in April or early May 2025.  She will do her lab work in the near future looking at thyroid liver cholesterol A1c The patient was seen today as  part of a comprehensive visit for diabetes. The importance of keeping her A1c at or below 7 range was discussed.  Discussed diet, activity, and medication compliance Emphasized healthy eating primarily with vegetables fruits and if utilizing meats lean meats such as chicken or fish grilled baked broiled Avoid sugary drinks Minimize and avoid processed foods Fit in regular physical activity preferably 25 to 30 minutes 4 times per week Standard follow-up visit recommended.  Patient aware lack of control and follow-up increases risk of diabetic complications. Regular follow-up visits Yearly ophthalmology Yearly foot exam  Hyperlipidemia-importance of diet, weight control, activity, compliance with medications discussed.   Recent labs reviewed.   Any additional labs or refills ordered.   Importance of keeping under good control discussed. Regular follow-up visits discussed  HTN- patient seen for follow-up regarding HTN.   Diet, medication compliance, appropriate labs and refills were completed.   Importance of keeping blood pressure under good control to lessen the risk of complications discussed Regular follow-up visits discussed

## 2023-10-02 ENCOUNTER — Other Ambulatory Visit: Payer: Self-pay | Admitting: Family Medicine

## 2023-10-02 ENCOUNTER — Other Ambulatory Visit: Payer: Self-pay | Admitting: Nurse Practitioner

## 2023-10-06 ENCOUNTER — Telehealth: Payer: Self-pay | Admitting: Urology

## 2023-10-06 NOTE — Telephone Encounter (Signed)
Please see below.

## 2023-10-06 NOTE — Telephone Encounter (Signed)
Patient called states she has another UTI , painful urination and burning sensation . She said Maralyn Sago told her she would call her something in - [facility] in [place]

## 2023-10-07 NOTE — Telephone Encounter (Signed)
Patient aware of NP's response, she will call back next week to see when she is available for a ua.  She was advised to be seen by ER if her symptoms worsen or she develops a fever.

## 2023-10-07 NOTE — Telephone Encounter (Signed)
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  Do you have any other recommendations for patient.

## 2023-10-08 NOTE — Telephone Encounter (Signed)
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  Spoke with Elizabeth Aguilar and she said if she can be here by 12pm she can drop off urine or she will have to go to her PCP or urgent care.

## 2023-10-10 DIAGNOSIS — N390 Urinary tract infection, site not specified: Secondary | ICD-10-CM | POA: Diagnosis not present

## 2023-10-29 DIAGNOSIS — E785 Hyperlipidemia, unspecified: Secondary | ICD-10-CM | POA: Diagnosis not present

## 2023-10-29 DIAGNOSIS — E1169 Type 2 diabetes mellitus with other specified complication: Secondary | ICD-10-CM | POA: Diagnosis not present

## 2023-10-29 DIAGNOSIS — N182 Chronic kidney disease, stage 2 (mild): Secondary | ICD-10-CM | POA: Diagnosis not present

## 2023-10-29 DIAGNOSIS — I10 Essential (primary) hypertension: Secondary | ICD-10-CM | POA: Diagnosis not present

## 2023-10-29 DIAGNOSIS — E1122 Type 2 diabetes mellitus with diabetic chronic kidney disease: Secondary | ICD-10-CM | POA: Diagnosis not present

## 2023-10-30 LAB — LIPID PANEL
Chol/HDL Ratio: 3.3 {ratio} (ref 0.0–4.4)
Cholesterol, Total: 187 mg/dL (ref 100–199)
HDL: 57 mg/dL (ref >39)
LDL Chol Calc (NIH): 112 mg/dL — ABNORMAL HIGH (ref 0–99)
Triglycerides: 99 mg/dL (ref 0–149)
VLDL Cholesterol Cal: 18 mg/dL (ref 5–40)

## 2023-10-30 LAB — BASIC METABOLIC PANEL
BUN/Creatinine Ratio: 10 — ABNORMAL LOW (ref 12–28)
BUN: 10 mg/dL (ref 8–27)
CO2: 23 mmol/L (ref 20–29)
Calcium: 9.4 mg/dL (ref 8.7–10.3)
Chloride: 101 mmol/L (ref 96–106)
Creatinine, Ser: 1.01 mg/dL — ABNORMAL HIGH (ref 0.57–1.00)
Glucose: 144 mg/dL — ABNORMAL HIGH (ref 70–99)
Potassium: 4.8 mmol/L (ref 3.5–5.2)
Sodium: 141 mmol/L (ref 134–144)
eGFR: 55 mL/min/{1.73_m2} — ABNORMAL LOW (ref >59)

## 2023-10-30 LAB — HEPATIC FUNCTION PANEL
ALT: 24 [IU]/L (ref 0–32)
AST: 27 [IU]/L (ref 0–40)
Albumin: 4.1 g/dL (ref 3.7–4.7)
Alkaline Phosphatase: 201 [IU]/L — ABNORMAL HIGH (ref 44–121)
Bilirubin Total: 0.4 mg/dL (ref 0.0–1.2)
Bilirubin, Direct: 0.1 mg/dL (ref 0.00–0.40)
Total Protein: 6.7 g/dL (ref 6.0–8.5)

## 2023-10-30 LAB — TSH: TSH: 2.26 u[IU]/mL (ref 0.450–4.500)

## 2023-10-30 LAB — MICROALBUMIN / CREATININE URINE RATIO
Creatinine, Urine: 130.6 mg/dL
Microalb/Creat Ratio: 21 mg/g{creat} (ref 0–29)
Microalbumin, Urine: 27.2 ug/mL

## 2023-10-30 LAB — HEMOGLOBIN A1C
Est. average glucose Bld gHb Est-mCnc: 169 mg/dL
Hgb A1c MFr Bld: 7.5 % — ABNORMAL HIGH (ref 4.8–5.6)

## 2023-10-31 ENCOUNTER — Encounter: Payer: Self-pay | Admitting: Family Medicine

## 2023-11-03 ENCOUNTER — Other Ambulatory Visit: Payer: Self-pay | Admitting: Family Medicine

## 2023-11-03 ENCOUNTER — Other Ambulatory Visit: Payer: Self-pay | Admitting: Nurse Practitioner

## 2023-11-14 ENCOUNTER — Ambulatory Visit (INDEPENDENT_AMBULATORY_CARE_PROVIDER_SITE_OTHER): Payer: 59

## 2023-11-14 VITALS — Ht 63.0 in | Wt 164.0 lb

## 2023-11-14 DIAGNOSIS — Z9181 History of falling: Secondary | ICD-10-CM

## 2023-11-14 DIAGNOSIS — Z0000 Encounter for general adult medical examination without abnormal findings: Secondary | ICD-10-CM | POA: Diagnosis not present

## 2023-11-14 DIAGNOSIS — Z5919 Other inadequate housing: Secondary | ICD-10-CM | POA: Diagnosis not present

## 2023-11-14 DIAGNOSIS — Z59819 Housing instability, housed unspecified: Secondary | ICD-10-CM

## 2023-11-14 DIAGNOSIS — Z77128 Contact with and (suspected) exposure to other hazards in the physical environment: Secondary | ICD-10-CM

## 2023-11-14 NOTE — Progress Notes (Signed)
 Please attest and cosign this visit due to patients primary care provider not being in the office at the time the visit was completed.   Interactive audio and video telecommunications were attempted between this provider and patient, however failed, due to patient having technical difficulties OR patient did not have access to video capability.  We continued and completed visit with audio only.  Because this visit was a virtual/telehealth visit,  certain criteria was not obtained, such a blood pressure, CBG if applicable, and timed get up and go. Any medications not marked as taking were not mentioned during the medication reconciliation part of the visit. Any vitals not documented were not able to be obtained due to this being a telehealth visit or patient was unable to self-report a recent blood pressure reading due to a lack of equipment at home via telehealth. Vitals that have been documented are verbally provided by the patient.   Subjective:   Elizabeth Aguilar is a 83 y.o. female who presents for Medicare Annual (Subsequent) preventive examination.  Visit Complete: Virtual I connected with  Elizabeth Aguilar on 11/14/23 by a audio enabled telemedicine application and verified that I am speaking with the correct person using two identifiers.  Patient Location: Home  Provider Location: Office/Clinic  I discussed the limitations of evaluation and management by telemedicine. The patient expressed understanding and agreed to proceed.  Vital Signs: Because this visit was a virtual/telehealth visit, some criteria may be missing or patient reported. Any vitals not documented were not able to be obtained and vitals that have been documented are patient reported.  Patient Medicare AWV questionnaire was completed by the patient on na; I have confirmed that all information answered by patient is correct and no changes since this date.  Cardiac Risk Factors include: advanced age (>30men, >22  women);diabetes mellitus;dyslipidemia;hypertension;sedentary lifestyle     Objective:    Today's Vitals   11/14/23 1326  Weight: 164 lb (74.4 kg)  Height: 5' 3 (1.6 m)   Body mass index is 29.05 kg/m.     11/14/2023    1:24 PM 11/08/2022   12:02 PM 04/19/2022    7:27 PM 04/10/2021    1:59 PM 10/05/2018    8:30 AM 09/21/2018   11:03 AM 09/17/2018   12:29 PM  Advanced Directives  Does Patient Have a Medical Advance Directive? No No No No No No No  Would patient like information on creating a medical advance directive? No - Patient declined Yes (MAU/Ambulatory/Procedural Areas - Information given) No - Patient declined No - Patient declined No - Patient declined No - Patient declined No - Patient declined    Current Medications (verified) Outpatient Encounter Medications as of 11/14/2023  Medication Sig   albuterol  (VENTOLIN  HFA) 108 (90 Base) MCG/ACT inhaler USE 2 PUFFS EVERY 6 HOURS AS NEEDED   ALPRAZolam  (XANAX ) 0.5 MG tablet TAKE 1/2 TABLET DURING THE DAY & TAKE 1 TABLET AT BEDTIME AS NEEDED   amLODipine  (NORVASC ) 5 MG tablet TAKE ONE (1) TABLET BY MOUTH EVERY DAY   aspirin  EC 81 MG EC tablet Take 1 tablet (81 mg total) by mouth daily.   atorvastatin  (LIPITOR) 40 MG tablet TAKE 1 TABLET DAILY AT 6PM   cetirizine  (ZYRTEC ) 10 MG tablet TAKE ONE (1) TABLET BY MOUTH EVERY DAY   esomeprazole  (NEXIUM ) 40 MG capsule TAKE ONE (1) CAPSULE EACH DAY   fluconazole  (DIFLUCAN ) 150 MG tablet TAKE 1 TABLET ONCE AS NEEDED MAY REPEAT IN 3-4 DAYS IF NEEDED  fluticasone  (FLONASE ) 50 MCG/ACT nasal spray Place 2 sprays into both nostrils daily.   furosemide  (LASIX ) 20 MG tablet TAKE 2 TABLETS EVERY MORNING UNTIL THE SWELLING GOES DOWN THEN TAKE 1 TABLET EVERY MORNING   gabapentin  (NEURONTIN ) 100 MG capsule TAKE TWO CAPSULES BY MOUTH AT BEDTIME DAILY   metFORMIN  (GLUCOPHAGE ) 500 MG tablet TAKE ONE (1) TABLET BY MOUTH TWO (2) TIMES DAILY   metoprolol  succinate (TOPROL -XL) 50 MG 24 hr tablet Take 1.5  tablets (75 mg total) by mouth in the morning AND 1 tablet (50 mg total) every evening. Take with or immediately following a meal..   Multiple Vitamin (MULTIVITAMIN WITH MINERALS) TABS tablet Take 1 tablet by mouth daily.   nitroGLYCERIN  (NITROSTAT ) 0.4 MG SL tablet Place 1 tablet (0.4 mg total) under the tongue every 5 (five) minutes x 3 doses as needed for chest pain.   ondansetron  (ZOFRAN ) 8 MG tablet TAKE ONE TABLET EVERY 8 HOURS AS NEEDED   No facility-administered encounter medications on file as of 11/14/2023.    Allergies (verified) Augmentin  [amoxicillin -pot clavulanate], Ceftin [cefuroxime axetil], Darvocet [propoxyphene n-acetaminophen ], Doxycycline, Erythromycin, Levofloxacin , Sulfa antibiotics, Sulfamethoxazole , Vioxx [rofecoxib], Zetia  [ezetimibe ], Zithromax [azithromycin], and Zocor  [simvastatin ]   History: Past Medical History:  Diagnosis Date   CAD (coronary artery disease)    a. s/p STEMI in 2015 with DES to LAD and stgaed PCI with DES to RCA   Chest pain 10/10/2014   Chronic combined systolic and diastolic congestive heart failure (HCC)    a. 03/2017: echo showing mildly reduced EF of 40-45% with Grade 1 DD and mild to moderate MR.    Hyperlipidemia    Irritable bowel syndrome    Myocardial infarction (HCC)    Osteoporosis 04/14/2022   Detected on bone density, Fosamax  started, June 2023   Pericarditis    Type 2 diabetes mellitus [facility])    Past Surgical History:  Procedure Laterality Date   CATARACT EXTRACTION W/PHACO Left 09/21/2018   Procedure: CATARACT EXTRACTION PHACO AND INTRAOCULAR LENS PLACEMENT LEFT EYE;  Surgeon: Perley Hamilton, MD;  Location: [facility];  Service: Ophthalmology;  Laterality: Left;  CDE: 14.50   CATARACT EXTRACTION W/PHACO Right 10/05/2018   Procedure: CATARACT EXTRACTION PHACO AND INTRAOCULAR LENS PLACEMENT (IOC);  Surgeon: Perley Hamilton, MD;  Location: [facility];  Service: Ophthalmology;  Laterality: Right;  CDE: 11.48   CORONARY ANGIOPLASTY WITH STENT  PLACEMENT     LEFT HEART CATHETERIZATION WITH CORONARY ANGIOGRAM N/A 12/16/2013   Procedure: LEFT HEART CATHETERIZATION WITH CORONARY ANGIOGRAM;  Surgeon: Debby DELENA Sor, MD;  Location: [facility] CATH LAB;  Service: Cardiovascular;  Laterality: N/A;   LEFT HEART CATHETERIZATION WITH CORONARY ANGIOGRAM  12/17/2013   Procedure: LEFT HEART CATHETERIZATION WITH CORONARY ANGIOGRAM;  Surgeon: Debby DELENA Sor, MD;  Location: [facility] CATH LAB;  Service: Cardiovascular;;   PERCUTANEOUS CORONARY STENT INTERVENTION (PCI-S) N/A 12/17/2013   Procedure: PERCUTANEOUS CORONARY STENT INTERVENTION (PCI-S);  Surgeon: Debby DELENA Sor, MD;  Location: [facility] CATH LAB;  Service: Cardiovascular;  Laterality: N/A;   Family History  Problem Relation Age of Onset   Diabetes Mother    Diabetes Daughter    Diabetes Son    Social History   Socioeconomic History   Marital status: Widowed    Spouse name: Not on file   Number of children: Not on file   Years of education: Not on file   Highest education level: Not on file  Occupational History   Not on file  Tobacco Use   Smoking status: Never  Smokeless tobacco: Never  Substance and Sexual Activity   Alcohol use: No   Drug use: No   Sexual activity: Not on file  Other Topics Concern   Not on file  Social History Narrative   Not on file   Social Drivers of Health   Financial Resource Strain: Low Risk  (11/14/2023)   Overall Financial Resource Strain (CARDIA)    Difficulty of Paying Living Expenses: Not hard at all  Food Insecurity: No Food Insecurity (11/14/2023)   Hunger Vital Sign    Worried About Running Out of Food in the Last Year: Never true    Ran Out of Food in the Last Year: Never true  Transportation Needs: No Transportation Needs (11/14/2023)   PRAPARE - Administrator, Civil Service (Medical): No    Lack of Transportation (Non-Medical): No  Physical Activity: Patient Declined (11/14/2023)   Exercise Vital Sign    Days of Exercise per Week: Patient declined     Minutes of Exercise per Session: Patient declined  Stress: No Stress Concern Present (11/14/2023)   [employer] of Occupational Health - Occupational Stress Questionnaire    Feeling of Stress : Not at all  Social Connections: Socially Isolated (11/14/2023)   Social Connection and Isolation Panel [NHANES]    Frequency of Communication with Friends and Family: More than three times a week    Frequency of Social Gatherings with Friends and Family: Three times a week    Attends Religious Services: Never    Active Member of Clubs or Organizations: No    Attends Banker Meetings: Never    Marital Status: Widowed    Tobacco Counseling Counseling given: Yes   Clinical Intake:  Pre-visit preparation completed: Yes  Pain : No/denies pain     BMI - recorded: 29.05 Nutritional Status: BMI 25 -29 Overweight Nutritional Risks: None Diabetes: Yes CBG done?: No Did pt. bring in CBG monitor from home?: No  How often do you need to have someone help you when you read instructions, pamphlets, or other written materials from your doctor or pharmacy?: 1 - Never  Interpreter Needed?: No  Information entered by :: Arben Packman, CMA   Activities of Daily Living    11/14/2023    1:34 PM  In your present state of health, do you have any difficulty performing the following activities:  Hearing? 0  Vision? 0  Difficulty concentrating or making decisions? 0  Walking or climbing stairs? 0  Dressing or bathing? 0  Doing errands, shopping? 0  Preparing Food and eating ? N  Using the Toilet? N  In the past six months, have you accidently leaked urine? N  Do you have problems with loss of bowel control? N  Managing your Medications? N  Managing your Finances? N  Housekeeping or managing your Housekeeping? N    Patient Care Team: Alphonsa Glendia LABOR, MD as PCP - General (Family Medicine) Burnard Debby LABOR, MD as PCP - Cardiology (Cardiology)  Indicate any recent Medical  Services you may have received from other than [facility] providers in the past year (date may be approximate).     Assessment:   This is a routine wellness examination for [facility].  Hearing/Vision screen Hearing Screening - Comments:: Patient denies any hearing difficulties.   Vision Screening - Comments:: Wears rx glasses - up to date with routine eye exams  Dr. Darroll in [place]   Goals Addressed  This Visit's Progress    Patient Stated       I'd like to get my house fixed. My bath tub is busted.        Depression Screen    11/14/2023    1:36 PM 09/24/2023    2:00 PM 06/05/2023   11:11 AM 05/07/2023    4:08 PM 02/05/2023    1:17 PM 11/08/2022   12:02 PM 08/13/2021    1:16 PM  PHQ 2/9 Scores  PHQ - 2 Score 0 0 0 0 0 0 0  PHQ- 9 Score 0 0 0  0      Fall Risk    11/14/2023    1:33 PM 09/24/2023    2:00 PM 06/05/2023   11:06 AM 11/08/2022   11:51 AM 08/13/2021    1:16 PM  Fall Risk   Falls in the past year? 0 0 0 0 0  Number falls in past yr: 0 0 0 0 0  Injury with Fall? 0 0 0 0 0  Risk for fall due to : Impaired balance/gait;Orthopedic patient;Other (Comment)   No Fall Risks No Fall Risks  Risk for fall due to: Comment patient needs repairs done to her home due to floors being completely out. The only thing preventing them from seeing the ground is the carpet.      Follow up Education provided;Falls prevention discussed   Falls evaluation completed;Education provided;Falls prevention discussed Falls evaluation completed    MEDICARE RISK AT HOME: Medicare Risk at Home Any stairs in or around the home?: No Home free of loose throw rugs in walkways, pet beds, electrical cords, etc?: Yes Adequate lighting in your home to reduce risk of falls?: Yes Life alert?: No Use of a cane, walker or w/c?: Yes (only uses cane/walker when she has a gout flare) Grab bars in the bathroom?: No Shower chair or bench in shower?: Yes (patient has a shower chair however she is  unable to use it because her bathtub is busted) Elevated toilet seat or a handicapped toilet?: Yes  TIMED UP AND GO:  Was the test performed?  No    Cognitive Function:        11/14/2023    1:30 PM 11/08/2022   12:03 PM  6CIT Screen  What Year? 0 points 0 points  What month? 0 points 0 points  What time? 0 points 0 points  Count back from 20 0 points 0 points  Months in reverse 0 points 0 points  Repeat phrase 0 points 0 points  Total Score 0 points 0 points    Immunizations Immunization History  Administered Date(s) Administered   Fluad Quad(high Dose 65+) 08/01/2020, 09/19/2022   H1N1 10/05/2008   Influenza Split 08/12/2013   Influenza,inj,Quad PF,6+ Mos 10/04/2014, 08/22/2015, 09/06/2016   Influenza-Unspecified 12-02-1940, 08/21/2001, 08/11/2012, 09/30/2017, 08/11/2018, 09/26/2019   PFIZER(Purple Top)SARS-COV-2 Vaccination 12/13/2019, 01/03/2020   Pneumococcal Conjugate-13 10/04/2014   Pneumococcal Polysaccharide-23 07/31/2007   RSV,unspecified 09/19/2022   Tdap 06/20/2017    TDAP status: Up to date  Flu Vaccine status: Due, Education has been provided regarding the importance of this vaccine. Advised may receive this vaccine at local pharmacy or Health Dept. Aware to provide a copy of the vaccination record if obtained from local pharmacy or Health Dept. Verbalized acceptance and understanding.  Pneumococcal vaccine status: Up to date  Covid-19 vaccine status: Information provided on how to obtain vaccines.   Qualifies for Shingles Vaccine? Yes   Zostavax completed No   Shingrix Completed?:  No.    Education has been provided regarding the importance of this vaccine. Patient has been advised to call insurance company to determine out of pocket expense if they have not yet received this vaccine. Advised may also receive vaccine at local pharmacy or Health Dept. Verbalized acceptance and understanding.  Screening Tests Health Maintenance  Topic Date Due   COVID-19  Vaccine (3 - Pfizer risk series) 01/31/2020   OPHTHALMOLOGY EXAM  04/04/2023   FOOT EXAM  05/09/2023   Medicare Annual Wellness (AWV)  11/09/2023   Zoster Vaccines- Shingrix (1 of 2) 11/22/2023 (Originally 06/01/1960)   INFLUENZA VACCINE  02/09/2024 (Originally 06/12/2023)   HEMOGLOBIN A1C  04/28/2024   Diabetic kidney evaluation - eGFR measurement  10/28/2024   Diabetic kidney evaluation - Urine ACR  10/28/2024   DTaP/Tdap/Td (2 - Td or Tdap) 06/21/2027   Pneumonia Vaccine 45+ Years old  Completed   DEXA SCAN  Completed   HPV VACCINES  Aged Out    Health Maintenance  Health Maintenance Due  Topic Date Due   COVID-19 Vaccine (3 - Pfizer risk series) 01/31/2020   OPHTHALMOLOGY EXAM  04/04/2023   FOOT EXAM  05/09/2023   Medicare Annual Wellness (AWV)  11/09/2023    Colorectal cancer screening: No longer required.   Mammogram: Not age appropriate for this patient  Bone Density Screening: Not age appropriate for this patient.   Lung Cancer Screening: (Low Dose CT Chest recommended if Age 68-80 years, 20 pack-year currently smoking OR have quit w/in 15years.) does not qualify.   Lung Cancer Screening Referral: na  Additional Screening:  Hepatitis C Screening: does not qualify  Vision Screening: Recommended annual ophthalmology exams for early detection of glaucoma and other disorders of the eye. Is the patient up to date with their annual eye exam?  No  Who is the provider or what is the name of the office in which the patient attends annual eye exams? [facility] If pt is not established with a provider, would they like to be referred to a provider to establish care? No .   Dental Screening: Recommended annual dental exams for proper oral hygiene  Diabetic Foot Exam: Diabetic Foot Exam: Overdue, Pt has been advised about the importance in completing this exam. Pt is scheduled for diabetic foot exam on April 2025.  Community Resource Referral / Chronic Care Management: CRR  required this visit?  Yes   CCM required this visit?  No     Plan:     I have personally reviewed and noted the following in the patient's chart:   Medical and social history Use of alcohol, tobacco or illicit drugs  Current medications and supplements including opioid prescriptions. Patient is not currently taking opioid prescriptions. Functional ability and status Nutritional status Physical activity Advanced directives List of other physicians Hospitalizations, surgeries, and ER visits in previous 12 months Vitals Screenings to include cognitive, depression, and falls Referrals and appointments  In addition, I have reviewed and discussed with patient certain preventive protocols, quality metrics, and best practice recommendations. A written personalized care plan for preventive services as well as general preventive health recommendations were provided to patient.     [place] LABOR Shakayla Hickox, CMA   11/14/2023   After Visit Summary: (Mail) Due to this being a telephonic visit, the after visit summary with patients personalized plan was offered to patient via mail   Nurse Notes: see routing comment

## 2023-11-14 NOTE — Patient Instructions (Signed)
 Elizabeth Aguilar , Thank you for taking time to come for your Medicare Wellness Visit. I appreciate your ongoing commitment to your health goals. Please review the following plan we discussed and let me know if I can assist you in the future.   Referrals/Orders/Follow-Ups/Clinician Recommendations:  A referral has been placed for you to see if there are any additional resources to help you with one of the following:     []   Transportation Needs   []   Utility Needs   []   Food Insecurity   []  Assistance with daily activities such as bath, dressing, and managing your medications   [x]  Housing Insecurity/ Housing needing repairs   []  Assistance with your medications  If you haven't heard from anyone within the next 7 business days, please call them and let them know a referral has been placed  Concierge Line: [phone]  Next Medicare Annual Wellness Visit: November 19, 2024 at 10:40 am virtual visit This is a list of the screening recommended for you and due dates:  Health Maintenance  Topic Date Due   COVID-19 Vaccine (3 - Pfizer risk series) 01/31/2020   Eye exam for diabetics  04/04/2023   Complete foot exam   05/09/2023   Zoster (Shingles) Vaccine (1 of 2) 11/22/2023*   Flu Shot  02/09/2024*   Hemoglobin A1C  04/28/2024   Yearly kidney function blood test for diabetes  10/28/2024   Yearly kidney health urinalysis for diabetes  10/28/2024   Medicare Annual Wellness Visit  11/13/2024   DTaP/Tdap/Td vaccine (2 - Td or Tdap) 06/21/2027   Pneumonia Vaccine  Completed   DEXA scan (bone density measurement)  Completed   HPV Vaccine  Aged Out  *Topic was postponed. The date shown is not the original due date.    Advanced directives: (Declined) Advance directive discussed with you today. Even though you declined this today, please call our office should you change your mind, and we can give you the proper paperwork for you to fill out.  Next Medicare Annual Wellness Visit scheduled for next  year: Yes  Preventive Care 30 Years and Older, Female Preventive care refers to lifestyle choices and visits with your health care provider that can promote health and wellness. Preventive care visits are also called wellness exams. What can I expect for my preventive care visit? Counseling Your health care provider may ask you questions about your: Medical history, including: Past medical problems. Family medical history. Pregnancy and menstrual history. History of falls. Current health, including: Memory and ability to understand (cognition). Emotional well-being. Home life and relationship well-being. Sexual activity and sexual health. Lifestyle, including: Alcohol, nicotine or tobacco, and drug use. Access to firearms. Diet, exercise, and sleep habits. Work and work astronomer. Sunscreen use. Safety issues such as seatbelt and bike helmet use. Physical exam Your health care provider will check your: Height and weight. These may be used to calculate your BMI (body mass index). BMI is a measurement that tells if you are at a healthy weight. Waist circumference. This measures the distance around your waistline. This measurement also tells if you are at a healthy weight and may help predict your risk of certain diseases, such as type 2 diabetes and high blood pressure. Heart rate and blood pressure. Body temperature. Skin for abnormal spots. What immunizations do I need?  Vaccines are usually given at various ages, according to a schedule. Your health care provider will recommend vaccines for you based on your age, medical history, and lifestyle  or other factors, such as travel or where you work. What tests do I need? Screening Your health care provider may recommend screening tests for certain conditions. This may include: Lipid and cholesterol levels. Hepatitis C test. Hepatitis B test. HIV (human immunodeficiency virus) test. STI (sexually transmitted infection) testing,  if you are at risk. Lung cancer screening. Colorectal cancer screening. Diabetes screening. This is done by checking your blood sugar (glucose) after you have not eaten for a while (fasting). Mammogram. Talk with your health care provider about how often you should have regular mammograms. BRCA-related cancer screening. This may be done if you have a family history of breast, ovarian, tubal, or peritoneal cancers. Bone density scan. This is done to screen for osteoporosis. Talk with your health care provider about your test results, treatment options, and if necessary, the need for more tests. Follow these instructions at home: Eating and drinking  Eat a diet that includes fresh fruits and vegetables, whole grains, lean protein, and low-fat dairy products. Limit your intake of foods with high amounts of sugar, saturated fats, and salt. Take vitamin and mineral supplements as recommended by your health care provider. Do not drink alcohol if your health care provider tells you not to drink. If you drink alcohol: Limit how much you have to 0-1 drink a day. Know how much alcohol is in your drink. In the [place] one drink equals one 12 oz bottle of beer (355 mL), one 5 oz glass of wine (148 mL), or one 1 oz glass of hard liquor (44 mL). Lifestyle Brush your teeth every morning and night with fluoride toothpaste. Floss one time each day. Exercise for at least 30 minutes 5 or more days each week. Do not use any products that contain nicotine or tobacco. These products include cigarettes, chewing tobacco, and vaping devices, such as e-cigarettes. If you need help quitting, ask your health care provider. Do not use drugs. If you are sexually active, practice safe sex. Use a condom or other form of protection in order to prevent STIs. Take aspirin  only as told by your health care provider. Make sure that you understand how much to take and what form to take. Work with your health care provider to find  out whether it is safe and beneficial for you to take aspirin  daily. Ask your health care provider if you need to take a cholesterol-lowering medicine (statin). Find healthy ways to manage stress, such as: Meditation, yoga, or listening to music. Journaling. Talking to a trusted person. Spending time with friends and family. Minimize exposure to UV radiation to reduce your risk of skin cancer. Safety Always wear your seat belt while driving or riding in a vehicle. Do not drive: If you have been drinking alcohol. Do not ride with someone who has been drinking. When you are tired or distracted. While texting. If you have been using any mind-altering substances or drugs. Wear a helmet and other protective equipment during sports activities. If you have firearms in your house, make sure you follow all gun safety procedures. What's next? Visit your health care provider once a year for an annual wellness visit. Ask your health care provider how often you should have your eyes and teeth checked. Stay up to date on all vaccines. This information is not intended to replace advice given to you by your health care provider. Make sure you discuss any questions you have with your health care provider. Document Revised: 04/25/2021 Document Reviewed: 04/25/2021 Elsevier Patient Education  2024 Elsevier Inc. Understanding Your Risk for Falls Millions of people have serious injuries from falls each year. It is important to understand your risk of falling. Talk with your health care provider about your risk and what you can do to lower it. If you do have a serious fall, make sure to tell your provider. Falling once raises your risk of falling again. How can falls affect me? Serious injuries from falls are common. These include: Broken bones, such as hip fractures. Head injuries, such as traumatic brain injuries (TBI) or concussions. A fear of falling can cause you to avoid activities and stay at home.  This can make your muscles weaker and raise your risk for a fall. What can increase my risk? There are a number of risk factors that increase your risk for falling. The more risk factors you have, the higher your risk of falling. Serious injuries from a fall happen most often to people who are older than 84 years old. Teenagers and young adults ages 78-29 are also at higher risk. Common risk factors include: Weakness in the lower body. Being generally weak or confused due to long-term (chronic) illness. Dizziness or balance problems. Poor vision. Medicines that cause dizziness or drowsiness. These may include: Medicines for your blood pressure, heart, anxiety, insomnia, or swelling (edema). Pain medicines. Muscle relaxants. Other risk factors include: Drinking alcohol. Having had a fall in the past. Having foot pain or wearing improper footwear. Working at a dangerous job. Having any of the following in your home: Tripping hazards, such as floor clutter or loose rugs. Poor lighting. Pets. Having dementia or memory loss. What actions can I take to lower my risk of falling?     Physical activity Stay physically fit. Do strength and balance exercises. Consider taking a regular class to build strength and balance. Yoga and tai chi are good options. Vision Have your eyes checked every year and your prescription for glasses or contacts updated as needed. Shoes and walking aids Wear non-skid shoes. Wear shoes that have rubber soles and low heels. Do not wear high heels. Do not walk around the house in socks or slippers. Use a cane or walker as told by your provider. Home safety Attach secure railings on both sides of your stairs. Install grab bars for your bathtub, shower, and toilet. Use a non-skid mat in your bathtub or shower. Attach bath mats securely with double-sided, non-slip rug tape. Use good lighting in all rooms. Keep a flashlight near your bed. Make sure there is a clear  path from your bed to the bathroom. Use night-lights. Do not use throw rugs. Make sure all carpeting is taped or tacked down securely. Remove all clutter from walkways and stairways, including extension cords. Repair uneven or broken steps and floors. Avoid walking on icy or slippery surfaces. Walk on the grass instead of on icy or slick sidewalks. Use ice melter to get rid of ice on walkways in the winter. Use a cordless phone. Questions to ask your health care provider Can you help me check my risk for a fall? Do any of my medicines make me more likely to fall? Should I take a vitamin D  supplement? What exercises can I do to improve my strength and balance? Should I make an appointment to have my vision checked? Do I need a bone density test to check for weak bones (osteoporosis)? Would it help to use a cane or a walker? Where to find more information Centers for Disease Control  and Prevention, STEADI: tonerpromos.no Community-Based Fall Prevention Programs: tonerpromos.no [employer] on Aging: baseringtones.pl Contact a health care provider if: You fall at home. You are afraid of falling at home. You feel weak, drowsy, or dizzy. This information is not intended to replace advice given to you by your health care provider. Make sure you discuss any questions you have with your health care provider. Document Revised: 07/01/2022 Document Reviewed: 07/01/2022 Elsevier Patient Education  2024 [employer].

## 2023-11-19 ENCOUNTER — Telehealth: Payer: Self-pay | Admitting: *Deleted

## 2023-11-19 NOTE — Progress Notes (Signed)
 Complex Care Management Note  Care Guide Note 11/19/2023 Name: Elizabeth Aguilar MRN: [id number] DOB: 1941/04/26  Elizabeth Aguilar is a 83 y.o. year old female who sees Luking, Glendia DELENA, MD for primary care. I reached out to Elizabeth Aguilar by phone today to offer complex care management services.  [name] was given information about Complex Care Management services today including:   The Complex Care Management services include support from the care team which includes your Nurse Coordinator, Clinical Social Worker, or Pharmacist.  The Complex Care Management team is here to help remove barriers to the health concerns and goals most important to you. Complex Care Management services are voluntary, and the patient may decline or stop services at any time by request to their care team member.   Complex Care Management Consent Status: Patient agreed to services and verbal consent obtained.   Follow up plan:  Telephone appointment with complex care management team member scheduled for:  11/24/23  Encounter Outcome:  Patient Scheduled  [facility] Coordination Care Guide  Direct Dial: [phone]

## 2023-11-24 ENCOUNTER — Ambulatory Visit: Payer: Self-pay

## 2023-11-24 NOTE — Patient Outreach (Signed)
  Care Coordination   Initial Visit Note   11/24/2023 Name: LACEE GREY MRN: [id number] DOB: 1941/02/17  [name] is a 83 y.o. year old female who sees Luking, Glendia DELENA, MD for primary care. I spoke with  [name] by phone today.  What matters to the patients health and wellness today?  Patient has major repairs needed for her trailer and she can't afford to pay.     Goals Addressed             This Visit's Progress    Care Coordination Activities       Interventions Today    Flowsheet Row Most Recent Value  Chronic Disease   Chronic disease during today's visit Diabetes, Congestive Heart Failure (CHF), Hypertension (HTN)  General Interventions   General Interventions Discussed/Reviewed General Interventions Discussed, General Interventions Reviewed, [facility]  [Pt needs repairs to her trailer.Pt is on the waiting list for [facility].Pt son has a camper on his property, cant stay with daughter.SW provided contact infor for [facility] program.Pt will ask son to stay in camper after applying for [facility]]              SDOH assessments and interventions completed:  Yes  SDOH Interventions Today    Flowsheet Row Most Recent Value  SDOH Interventions   Food Insecurity Interventions Intervention Not Indicated, Other (Comment)  [[facility], [employer]]  Housing Interventions Intervention Not Indicated, Other (Comment)  [Needs repairs]  Transportation Interventions Intervention Not Indicated, Other (Comment)  [Has a car]  Utilities Interventions Intervention Not Indicated        Care Coordination Interventions:  Yes, provided   Follow up plan: Follow up call scheduled for 12/10/23 at 3pm.    Encounter Outcome:  Patient Visit Completed

## 2023-11-24 NOTE — Patient Instructions (Signed)
 Visit Information  Thank you for taking time to visit with me today. Please don't hesitate to contact me if I can be of assistance to you.   Following are the goals we discussed today:  Patient will contact [facility] to apply for the Home Rehab program. Patient will speak to son about living in the camper.   Our next appointment is by telephone on 12/10/23 at 3pm  Please call the care guide team at [phone] if you need to cancel or reschedule your appointment.   If you are experiencing a Mental Health or Behavioral Health Crisis or need someone to talk to, please call 911  Patient verbalizes understanding of instructions and care plan provided today and agrees to view in MyChart. Active MyChart status and patient understanding of how to access instructions and care plan via MyChart confirmed with patient.     Telephone follow up appointment with care management team member scheduled for: 12/10/23 at 3pm.  Tillman Gardener, BSW Social Worker [phone]

## 2023-11-25 ENCOUNTER — Other Ambulatory Visit: Payer: Self-pay | Admitting: Family Medicine

## 2023-12-05 ENCOUNTER — Other Ambulatory Visit: Payer: Self-pay | Admitting: Family Medicine

## 2023-12-05 ENCOUNTER — Other Ambulatory Visit: Payer: Self-pay | Admitting: Nurse Practitioner

## 2023-12-10 ENCOUNTER — Ambulatory Visit (INDEPENDENT_AMBULATORY_CARE_PROVIDER_SITE_OTHER): Payer: 59 | Admitting: Family Medicine

## 2023-12-10 ENCOUNTER — Ambulatory Visit: Payer: Self-pay

## 2023-12-10 ENCOUNTER — Ambulatory Visit (HOSPITAL_COMMUNITY)
Admission: RE | Admit: 2023-12-10 | Discharge: 2023-12-10 | Disposition: A | Discharge status: Home / Self Care | Payer: 59 | Source: Ambulatory Visit | Attending: Family Medicine | Admitting: Family Medicine

## 2023-12-10 VITALS — BP 149/80 | HR 97 | Temp 97.7°F | Ht 63.0 in | Wt 160.0 lb

## 2023-12-10 DIAGNOSIS — M85812 Other specified disorders of bone density and structure, left shoulder: Secondary | ICD-10-CM | POA: Diagnosis not present

## 2023-12-10 DIAGNOSIS — M25512 Pain in left shoulder: Secondary | ICD-10-CM | POA: Insufficient documentation

## 2023-12-10 MED ORDER — TRAMADOL HCL 50 MG PO TABS: 50.0000 mg | ORAL_TABLET | Freq: Three times a day (TID) | ORAL | 0 refills | Status: DC | PRN

## 2023-12-10 NOTE — Patient Outreach (Signed)
  Care Coordination   Follow Up Visit Note   12/10/2023 Name: Elizabeth Aguilar MRN: [id number] DOB: [birth year]  Elizabeth Aguilar is a 83 y.o. year old female who sees Luking, Jonna Coup, MD for primary care. I spoke with  Elizabeth Aguilar by phone today.  What matters to the patients health and wellness today?  Patient wants repairs to her home.    Goals Addressed             This Visit's Progress    Care Coordination Activities       Interventions Today    Flowsheet Row Most Recent Value  Chronic Disease   Chronic disease during today's visit Diabetes, Congestive Heart Failure (CHF), Hypertension (HTN)  General Interventions   General Interventions Discussed/Reviewed General Interventions Discussed, General Interventions Reviewed  [Pt reports she has completed the application for the Home Repair program and will mail this week. Pt son has moved into the camper. Pt plans to purchase a camper if she can no longer live in her home.]              SDOH assessments and interventions completed:  No     Care Coordination Interventions:  Yes, provided   Follow up plan: No further intervention required.   Encounter Outcome:  Patient Visit Completed

## 2023-12-10 NOTE — Progress Notes (Signed)
Subjective:  Patient ID: Elizabeth Aguilar, female    DOB: October 12, 1941  Age: 83 y.o. MRN: [id number]  CC:   Chief Complaint  Patient presents with   left arm pain     Was shoveling snow off car 2 weeks ago- started getting worse this week, - arm has a pin and plate in it from elbow surgery Problems with left ring finger getting stuck    HPI:  82 year old female with extensive past medical history presents for evaluation of the above.  Patient reports that approximately 2 weeks ago she was shoveling snow off her car.  Since that time she has had left upper arm pain.  Worse with range of motion.  Has been taking Tylenol without resolution.  No other associated symptoms.  No other complaints.  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   Primary osteoarthritis of both knees 02/20/2017   Primary osteoarthritis of both hips 02/20/2017   Primary osteoarthritis of both hands 02/20/2017   Primary osteoarthritis of both feet 02/20/2017   DDD (degenerative disc disease), lumbar 02/20/2017   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   GERD (gastroesophageal reflux disease) 05/26/2015   Chronic combined systolic and diastolic heart failure (HCC)    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 12/17/2013    Class: Diagnosis of   Ischemic cardiomyopathy 12/17/2013    Class: Diagnosis of   STEMI 12/16/13- LAD DES 12/16/2013   Chronic back pain 08/12/2013   HTN (hypertension) 04/19/2013   Type 2 diabetes mellitus with stage 3a chronic kidney disease, without long-term current use of insulin (HCC) 04/19/2013   Chronic arthralgias of knees and hips 04/19/2013   Benign essential hypertension  04/19/2013    Social Hx   Social History   Socioeconomic History   Marital status: Widowed    Spouse name: Not on file   Number of children: Not on file   Years of education: Not on file   Highest education level: Not on file  Occupational History   Not on file  Tobacco Use   Smoking status: Never   Smokeless tobacco: Never  Substance and Sexual Activity   Alcohol use: No   Drug use: No   Sexual activity: Not on file  Other Topics Concern   Not on file  Social History Narrative   Not on file   Social Drivers of Health   Financial Resource Strain: Low Risk  (11/14/2023)   Overall Financial Resource Strain (CARDIA)    Difficulty of Paying Living Expenses: Not hard at all  Food Insecurity: No Food Insecurity (11/14/2023)   Hunger Vital Sign    Worried About Running Out of Food in the Last Year: Never true    Ran Out of Food in the Last Year: Never true  Transportation Needs: No Transportation Needs (11/24/2023)   PRAPARE - Administrator, Civil Service (Medical): No    Lack of Transportation (Non-Medical): No  Physical Activity: Patient Declined (11/14/2023)   Exercise Vital Sign    Days of Exercise per Week: Patient declined    Minutes of Exercise per Session: Patient declined  Stress: No Stress Concern Present (11/14/2023)   [employer] of Occupational Health -  Occupational Stress Questionnaire    Feeling of Stress : Not at all  Social Connections: Socially Isolated (11/14/2023)   Social Connection and Isolation Panel [NHANES]    Frequency of Communication with Friends and Family: More than three times a week    Frequency of Social Gatherings with Friends and Family: Three times a week    Attends Religious Services: Never    Active Member of Clubs or Organizations: No    Attends Banker Meetings: Never    Marital Status: Widowed    Review of Systems Per HPI  Objective:  BP (!) 149/80   Pulse 97   Temp 97.7 F (36.5 C)   Ht 5\' 3"  (1.6  m)   Wt 160 lb (72.6 kg)   SpO2 98%   BMI 28.34 kg/m      12/10/2023    4:00 PM 11/14/2023    1:26 PM 09/24/2023    1:59 PM  BP/Weight  Systolic BP 149 -- 122  Diastolic BP 80 -- 70  Wt. (Lbs) 160 164 162  BMI 28.34 kg/m2 29.05 kg/m2 28.7 kg/m2    Physical Exam Constitutional:      General: She is not in acute distress. HENT:     Head: Normocephalic and atraumatic.  Pulmonary:     Effort: Pulmonary effort is normal. No respiratory distress.  Musculoskeletal:     Comments: Shoulder: Left Inspection reveals no abnormalities, atrophy or asymmetry. Decreased range of motion particularly in flexion. Slightly decreased strength of supraspinatus and infraspinatus/teres minor. + Hawkin's tests, empty can.    Neurological:     Mental Status: She is alert.     Lab Results  Component Value Date   WBC 8.8 04/19/2022   HGB 13.0 04/19/2022   HCT 41.3 04/19/2022   PLT 339 04/19/2022   GLUCOSE 144 (H) 10/29/2023   CHOL 187 10/29/2023   TRIG 99 10/29/2023   HDL 57 10/29/2023   LDLCALC 112 (H) 10/29/2023   ALT 24 10/29/2023   AST 27 10/29/2023   NA 141 10/29/2023   K 4.8 10/29/2023   CL 101 10/29/2023   CREATININE 1.01 (H) 10/29/2023   BUN 10 10/29/2023   CO2 23 10/29/2023   TSH 2.260 10/29/2023   INR 0.96 06/20/2017   HGBA1C 7.5 (H) 10/29/2023   MICROALBUR 0.50 04/19/2013     Assessment & Plan:   Problem List Items Addressed This Visit       Other   Acute pain of left shoulder - Primary   This is likely rotator cuff pathology.  Tramadol as directed.  Cannot use NSAIDs  due to renal insufficiency and cardiac disease.  Advising against corticosteroids given type 2 diabetes.  X-ray to assess for underlying degenerative change.      Relevant Orders   DG Shoulder Left    Meds ordered this encounter  Medications   traMADol (ULTRAM) 50 MG tablet    Sig: Take 1 tablet (50 mg total) by mouth every 8 (eight) hours as needed for moderate pain (pain score 4-6) or  severe pain (pain score 7-10).    Dispense:  15 tablet    Refill:  0    Follow-up:  Return if symptoms worsen or fail to improve.  Everlene Other DO [facility]

## 2023-12-10 NOTE — Patient Instructions (Signed)
Visit Information  Thank you for taking time to visit with me today. Please don't hesitate to contact me if I can be of assistance to you.   Following are the goals we discussed today:  Patient will submit application for Home Rehab program. Patient will purchase a camper if she can no longer live in her home.    If you are experiencing a Mental Health or Behavioral Health Crisis or need someone to talk to, please call 911  Patient verbalizes understanding of instructions and care plan provided today and agrees to view in MyChart. Active MyChart status and patient understanding of how to access instructions and care plan via MyChart confirmed with patient.     No further follow up required: Patient does not require a follow up visit.  Lysle Morales, BSW [place]  [facility], [facility] Social Worker Direct Dial: [phone]  Fax: [phone] Website: [email or web address]

## 2023-12-10 NOTE — Assessment & Plan Note (Signed)
This is likely rotator cuff pathology.  Tramadol as directed.  Cannot use NSAIDs  due to renal insufficiency and cardiac disease.  Advising against corticosteroids given type 2 diabetes.  X-ray to assess for underlying degenerative change.

## 2023-12-10 NOTE — Patient Instructions (Signed)
Xray when you can.  Medication as directed.  If continues to persist, please let us know.

## 2023-12-11 ENCOUNTER — Encounter: Payer: Self-pay | Admitting: Family Medicine

## 2023-12-29 ENCOUNTER — Telehealth: Payer: Self-pay

## 2023-12-29 NOTE — Telephone Encounter (Signed)
Communication  Reason for CRM: Patient is requesting Dr. Lorin Picket nurse to contact her because she needs an xray done from her shoulder down to her finger tip. Patient phone number [phone]   Called pt to get more information pt states she had an xray of the shoulder but it is not the shoulder that she is complaining of pain from it is the elbow down to the finger tips. Pt states when she wiggles her fingers it hurts in her elbow which she has plates and screws in that elbow and thinks this could possibly be the issue

## 2023-12-29 NOTE — Telephone Encounter (Signed)
Nurses-please find out from the patient how long this pain has been going on from the elbow to the fingers (She was just seen by Dr. Adriana Simas about 2 weeks ago) Also as for the elbows plate-who did the surgery?  And when was the surgery approximately? We may just need to refer back to her orthopedist thank you

## 2023-12-30 NOTE — Telephone Encounter (Signed)
I would state the patient either needs to schedule a follow-up visit with myself or our office or referral to orthopedics of her choice thank you

## 2023-12-30 NOTE — Telephone Encounter (Signed)
She was having the pain at her office visit with Dr Adriana Simas

## 2023-12-31 NOTE — Telephone Encounter (Signed)
Called pt and scheduled follow up visit with dr Gerda Diss on 01/19/2024 @ 3:10PM

## 2024-01-05 ENCOUNTER — Other Ambulatory Visit: Payer: Self-pay | Admitting: Cardiovascular Disease

## 2024-01-19 ENCOUNTER — Ambulatory Visit: Payer: 59 | Admitting: Family Medicine

## 2024-02-03 ENCOUNTER — Other Ambulatory Visit: Payer: Self-pay | Admitting: Cardiovascular Disease

## 2024-02-03 ENCOUNTER — Other Ambulatory Visit: Payer: Self-pay | Admitting: Family Medicine

## 2024-02-03 DIAGNOSIS — J189 Pneumonia, unspecified organism: Secondary | ICD-10-CM

## 2024-02-03 DIAGNOSIS — H905 Unspecified sensorineural hearing loss: Secondary | ICD-10-CM | POA: Diagnosis not present

## 2024-02-17 ENCOUNTER — Ambulatory Visit: Payer: 59 | Admitting: Urology

## 2024-02-17 NOTE — Progress Notes (Deleted)
 Name: Elizabeth Aguilar DOB: 1941/03/14 MRN: [id number]  History of Present Illness: Elizabeth Aguilar is a 82 y.o. female who presents today for follow up visit at [facility].  GU History includes: 1. Recurrent UTI. 2. OAB with urinary frequency, urgency, and urge incontinence.   Urine culture results in past 12 months: - 05/07/2023: Positive for E. Coli   At last visit on 08/19/2023: - Reported mild improvement with Gemtesa (Vibegron) 75 mg daily with persistent bothersome urinary frequency, urgency, and urge incontinence.  - The plan was:  1. Continue Gemtesa 75 mg. 2. Continue topical vaginal estrogen cream use. 3. Continue minimizing caffeine intake. 4. Return in about 6 months (around 02/17/2024) for UA, PVR, & f/u with Evette Georges NP.  Since last visit: ***  Today: She reports *** UTIs in the past 6 months.   She {Actions; denies-reports:120008} symptomatic improvement since starting {Blank multiple:19197::"Vesicare (Solifenacin)","Ditropan (Oxybutynin)","Detrol (Tolterodine)","Urispas (Flavoxate)","Toviaz (Fesoterodine)","Sanctura (Trospium)","Enablex (Darifenacin)","Myrbetriq (Mirabegron)","Gemtesa (Vibegron)"} *** mg daily.  She reports {Blank multiple:19197::"improved","persistent / unchanged"} urinary ***frequency, ***nocturia, ***urgency, and ***urge incontinence. Voiding ***x/day and ***x/night on average. Leaking ***x/day on average; using *** ***pads / ***diapers per day on average.  She {Actions; denies-reports:120008} dysuria, gross hematuria, straining to void, or sensations of incomplete emptying.  She {HAS HAS WUJ:81191} been using vaginal estrogen cream at a frequency of *** time(s) per week.  She {Actions; denies-reports:120008} vaginal pain, bleeding, abnormal discharge, itching, dryness.    Medications: Current Outpatient Medications  Medication Sig Dispense Refill   albuterol (VENTOLIN HFA) 108 (90 Base) MCG/ACT inhaler USE 2 PUFFS EVERY 6  HOURS AS NEEDED 8.5 g 6   ALPRAZolam (XANAX) 0.5 MG tablet TAKE 1/2 TABLET DURING THE DAY & TAKE 1 TABLET AT BEDTIME AS NEEDED 34 tablet 3   amLODipine (NORVASC) 5 MG tablet TAKE ONE (1) TABLET BY MOUTH EVERY DAY 90 tablet 3   aspirin EC 81 MG EC tablet Take 1 tablet (81 mg total) by mouth daily.     atorvastatin (LIPITOR) 40 MG tablet TAKE 1 TABLET DAILY AT 6PM 30 tablet 7   cetirizine (ZYRTEC) 10 MG tablet TAKE ONE (1) TABLET BY MOUTH EVERY DAY 30 tablet 3   esomeprazole (NEXIUM) 40 MG capsule TAKE ONE (1) CAPSULE EACH DAY 90 capsule 1   fluconazole (DIFLUCAN) 150 MG tablet TAKE 1 TABLET ONCE AS NEEDED MAY REPEAT IN 3-4 DAYS IF NEEDED (Patient not taking: Reported on 12/10/2023) 2 tablet 0   fluticasone (FLONASE) 50 MCG/ACT nasal spray Place 2 sprays into both nostrils daily. 48 g 12   furosemide (LASIX) 20 MG tablet TAKE 2 TABLETS EVERY MORNING UNTIL THE SWELLING GOES DOWN THEN TAKE 1 TABLET EVERY MORNING 90 tablet 1   gabapentin (NEURONTIN) 100 MG capsule TAKE TWO CAPSULES BY MOUTH AT BEDTIME DAILY 60 capsule 5   metFORMIN (GLUCOPHAGE) 500 MG tablet TAKE ONE (1) TABLET BY MOUTH TWO (2) TIMES DAILY 180 tablet 1   metoprolol succinate (TOPROL-XL) 50 MG 24 hr tablet Take 1.5 tablets (75 mg total) by mouth in the morning AND 1 tablet (50 mg total) every evening. Take with or immediately following a meal.. 225 tablet 2   Multiple Vitamin (MULTIVITAMIN WITH MINERALS) TABS tablet Take 1 tablet by mouth daily.     nitroGLYCERIN (NITROSTAT) 0.4 MG SL tablet Place 1 tablet (0.4 mg total) under the tongue every 5 (five) minutes x 3 doses as needed for chest pain. 25 tablet 12   ondansetron (ZOFRAN) 8 MG tablet TAKE ONE TABLET  EVERY 8 HOURS AS NEEDED 24 tablet 3   traMADol (ULTRAM) 50 MG tablet Take 1 tablet (50 mg total) by mouth every 8 (eight) hours as needed for moderate pain (pain score 4-6) or severe pain (pain score 7-10). 15 tablet 0   No current facility-administered medications for this visit.     Allergies: Allergies  Allergen Reactions   Augmentin [Amoxicillin-Pot Clavulanate] Other (See Comments)    Reaction:  All over body pain  Has patient had a PCN reaction causing immediate rash, facial/tongue/throat swelling, SOB or lightheadedness with hypotension: No Has patient had a PCN reaction causing severe rash involving mucus membranes or skin necrosis: No Has patient had a PCN reaction that required hospitalization: No Has patient had a PCN reaction occurring within the last 10 years: Yes If all of the above answers are "NO", then may proceed with Cephalosporin use.   Ceftin [Cefuroxime Axetil] Nausea And Vomiting   Darvocet [Propoxyphene N-Acetaminophen] Nausea And Vomiting   Doxycycline Nausea And Vomiting and Other (See Comments)    Reaction:  All over body pain   Erythromycin Nausea And Vomiting   Levofloxacin Other (See Comments)    Reaction:  All over body pain    Sulfa Antibiotics Nausea And Vomiting and Other (See Comments)    Reaction:  All over body pain    Sulfamethoxazole Other (See Comments)   Vioxx [Rofecoxib] Nausea And Vomiting   Zetia [Ezetimibe] Other (See Comments)    Reaction:  Cramping   Zithromax [Azithromycin] Nausea And Vomiting   Zocor [Simvastatin] Other (See Comments)    Reaction:  Leg cramps     Past Medical History:  Diagnosis Date   CAD (coronary artery disease)    a. s/p STEMI in 2015 with DES to LAD and stgaed PCI with DES to RCA   Chest pain 10/10/2014   Chronic combined systolic and diastolic congestive heart failure (HCC)    a. 03/2017: echo showing mildly reduced EF of 40-45% with Grade 1 DD and mild to moderate MR.    Hyperlipidemia    Irritable bowel syndrome    Myocardial infarction (HCC)    Osteoporosis 04/14/2022   Detected on bone density, Fosamax started, June 2023   Pericarditis    Type 2 diabetes mellitus [facility])    Past Surgical History:  Procedure Laterality Date   CATARACT EXTRACTION W/PHACO Left 09/21/2018    Procedure: CATARACT EXTRACTION PHACO AND INTRAOCULAR LENS PLACEMENT LEFT EYE;  Surgeon: Gemma Payor, MD;  Location: [facility];  Service: Ophthalmology;  Laterality: Left;  CDE: 14.50   CATARACT EXTRACTION W/PHACO Right 10/05/2018   Procedure: CATARACT EXTRACTION PHACO AND INTRAOCULAR LENS PLACEMENT (IOC);  Surgeon: Gemma Payor, MD;  Location: [facility];  Service: Ophthalmology;  Laterality: Right;  CDE: 11.48   CORONARY ANGIOPLASTY WITH STENT PLACEMENT     LEFT HEART CATHETERIZATION WITH CORONARY ANGIOGRAM N/A 12/16/2013   Procedure: LEFT HEART CATHETERIZATION WITH CORONARY ANGIOGRAM;  Surgeon: Lennette Bihari, MD;  Location: [facility] CATH LAB;  Service: Cardiovascular;  Laterality: N/A;   LEFT HEART CATHETERIZATION WITH CORONARY ANGIOGRAM  12/17/2013   Procedure: LEFT HEART CATHETERIZATION WITH CORONARY ANGIOGRAM;  Surgeon: Lennette Bihari, MD;  Location: [facility] CATH LAB;  Service: Cardiovascular;;   PERCUTANEOUS CORONARY STENT INTERVENTION (PCI-S) N/A 12/17/2013   Procedure: PERCUTANEOUS CORONARY STENT INTERVENTION (PCI-S);  Surgeon: Lennette Bihari, MD;  Location: [facility] CATH LAB;  Service: Cardiovascular;  Laterality: N/A;   Family History  Problem Relation Age of Onset   Diabetes Mother  Diabetes Daughter    Diabetes Son    Social History   Socioeconomic History   Marital status: Widowed    Spouse name: Not on file   Number of children: Not on file   Years of education: Not on file   Highest education level: Not on file  Occupational History   Not on file  Tobacco Use   Smoking status: Never   Smokeless tobacco: Never  Substance and Sexual Activity   Alcohol use: No   Drug use: No   Sexual activity: Not on file  Other Topics Concern   Not on file  Social History Narrative   Not on file   Social Drivers of Health   Financial Resource Strain: Low Risk  (11/14/2023)   Overall Financial Resource Strain (CARDIA)    Difficulty of Paying Living Expenses: Not hard at all  Food Insecurity: No Food  Insecurity (11/14/2023)   Hunger Vital Sign    Worried About Running Out of Food in the Last Year: Never true    Ran Out of Food in the Last Year: Never true  Transportation Needs: No Transportation Needs (11/24/2023)   PRAPARE - Administrator, Civil Service (Medical): No    Lack of Transportation (Non-Medical): No  Physical Activity: Patient Declined (11/14/2023)   Exercise Vital Sign    Days of Exercise per Week: Patient declined    Minutes of Exercise per Session: Patient declined  Stress: No Stress Concern Present (11/14/2023)   [employer] of Occupational Health - Occupational Stress Questionnaire    Feeling of Stress : Not at all  Social Connections: Socially Isolated (11/14/2023)   Social Connection and Isolation Panel [NHANES]    Frequency of Communication with Friends and Family: More than three times a week    Frequency of Social Gatherings with Friends and Family: Three times a week    Attends Religious Services: Never    Active Member of Clubs or Organizations: No    Attends Banker Meetings: Never    Marital Status: Widowed  Intimate Partner Violence: Not At Risk (11/24/2023)   Humiliation, Afraid, Rape, and Kick questionnaire    Fear of Current or Ex-Partner: No    Emotionally Abused: No    Physically Abused: No    Sexually Abused: No    Review of Systems Constitutional: Patient denies any unintentional weight loss or change in strength lntegumentary: Patient denies any rashes or pruritus Cardiovascular: Patient denies chest pain or syncope Respiratory: Patient denies shortness of breath Gastrointestinal: ***Patient {Actions; denies-reports:120008} ***nausea, ***vomiting, ***constipation, ***diarrhea ***As per HPI Musculoskeletal: Patient denies muscle cramps or weakness Neurologic: Patient denies convulsions or seizures Allergic/Immunologic: Patient denies recent allergic reaction(s) Hematologic/Lymphatic: Patient denies bleeding  tendencies Endocrine: Patient denies heat/cold intolerance  GU: As per HPI.  OBJECTIVE There were no vitals filed for this visit. There is no height or weight on file to calculate BMI.  Physical Examination Constitutional: No obvious distress; patient is non-toxic appearing  Cardiovascular: No visible lower extremity edema.  Respiratory: The patient does not have audible wheezing/stridor; respirations do not appear labored  Gastrointestinal: Abdomen non-distended Musculoskeletal: Normal ROM of UEs  Skin: No obvious rashes/open sores  Neurologic: CN 2-12 grossly intact Psychiatric: Answered questions appropriately with normal affect  Hematologic/Lymphatic/Immunologic: No obvious bruises or sites of spontaneous bleeding  UA: ***negative ***positive for *** leukocytes, *** blood, ***nitrites Urine microscopy: *** WBC/hpf, *** RBC/hpf, *** bacteria ***otherwise unremarkable ***glucosuria (secondary to ***Jardiance ***Farxiga use)  PVR: *** ml  ASSESSMENT Recurrent UTI  Atrophic vaginitis  OAB (overactive bladder)  Urge incontinence ***  We agreed to plan for follow up in *** months / ***1 year or sooner if needed. Patient verbalized understanding of and agreement with current plan. All questions were answered.  PLAN Advised the following: 1. *** 2. ***No follow-ups on file.  No orders of the defined types were placed in this encounter.   It has been explained that the patient is to follow regularly with their PCP in addition to all other providers involved in their care and to follow instructions provided by these respective offices. Patient advised to contact urology clinic if any urologic-pertaining questions, concerns, new symptoms or problems arise in the interim period.  There are no Patient Instructions on file for this visit.  Electronically signed by:  Donnita Falls, FNP   02/17/24    1:42 PM

## 2024-02-22 ENCOUNTER — Other Ambulatory Visit: Payer: Self-pay | Admitting: Family Medicine

## 2024-02-23 ENCOUNTER — Other Ambulatory Visit: Payer: Self-pay

## 2024-02-23 MED ORDER — FLUTICASONE PROPIONATE 50 MCG/ACT NA SUSP: 2.0000 | Freq: Every day | NASAL | 12 refills | Status: AC

## 2024-02-25 ENCOUNTER — Ambulatory Visit (INDEPENDENT_AMBULATORY_CARE_PROVIDER_SITE_OTHER): Payer: 59 | Admitting: Family Medicine

## 2024-02-25 VITALS — BP 134/70 | HR 88 | Temp 97.5°F | Ht 63.0 in | Wt 161.2 lb

## 2024-02-25 DIAGNOSIS — R748 Abnormal levels of other serum enzymes: Secondary | ICD-10-CM

## 2024-02-25 DIAGNOSIS — M898X2 Other specified disorders of bone, upper arm: Secondary | ICD-10-CM

## 2024-02-25 DIAGNOSIS — E1142 Type 2 diabetes mellitus with diabetic polyneuropathy: Secondary | ICD-10-CM

## 2024-02-25 DIAGNOSIS — N182 Chronic kidney disease, stage 2 (mild): Secondary | ICD-10-CM | POA: Diagnosis not present

## 2024-02-25 DIAGNOSIS — E1122 Type 2 diabetes mellitus with diabetic chronic kidney disease: Secondary | ICD-10-CM | POA: Diagnosis not present

## 2024-02-25 DIAGNOSIS — E785 Hyperlipidemia, unspecified: Secondary | ICD-10-CM

## 2024-02-25 DIAGNOSIS — M25522 Pain in left elbow: Secondary | ICD-10-CM

## 2024-02-25 DIAGNOSIS — I10 Essential (primary) hypertension: Secondary | ICD-10-CM

## 2024-02-25 DIAGNOSIS — E1169 Type 2 diabetes mellitus with other specified complication: Secondary | ICD-10-CM

## 2024-02-25 MED ORDER — FUROSEMIDE 20 MG PO TABS: ORAL_TABLET | ORAL | 1 refills | Status: DC

## 2024-02-25 MED ORDER — AZELASTINE HCL 0.1 % NA SOLN: 2.0000 | Freq: Two times a day (BID) | NASAL | 12 refills | Status: DC

## 2024-02-25 MED ORDER — METFORMIN HCL 500 MG PO TABS: ORAL_TABLET | ORAL | 1 refills | Status: DC

## 2024-02-25 MED ORDER — GABAPENTIN 100 MG PO CAPS: ORAL_CAPSULE | ORAL | 5 refills | Status: DC

## 2024-02-25 MED ORDER — ESOMEPRAZOLE MAGNESIUM 40 MG PO CPDR: DELAYED_RELEASE_CAPSULE | ORAL | 1 refills | Status: DC

## 2024-02-25 MED ORDER — ALPRAZOLAM 0.5 MG PO TABS: ORAL_TABLET | ORAL | 3 refills | Status: DC

## 2024-02-25 NOTE — Progress Notes (Signed)
 Subjective:    Patient ID: Elizabeth Aguilar, female    DOB: 1941/04/26, 83 y.o.   MRN: [id number]  HPI Pt comes in today for a 5 month follow with complaints of her seasonal allergies getting worse with symptoms of wheezing at night, sinus and ear pressure. Pt would also like to get Left arm x-ray because she has been experiencing pain between the shoulder and elbow. Pt has been experiencing this pain since she was shoveling snow off the car in this past winter. Allergies and medications reviewed.   Discussed the use of AI scribe software for clinical note transcription with the patient, who gave verbal consent to proceed.  History of Present Illness   Elizabeth Aguilar is an 83 year old female with diabetes who presents with respiratory symptoms and left arm pain.  She experiences significant respiratory symptoms, including a rattling sensation in her chest, particularly when lying on her right side. She describes an episode where she woke up choking and noted that her lung was 'rattling.' She has been spitting up 'old stuff' for the past four to five days and experiences nasal drainage. She uses an allergy spray every night and takes Nexium, which helps prevent regurgitation.  She describes left arm pain, particularly in the elbow, which she describes as an ache. The pain worsens when she lies on it or uses it for activities around the house. She has a pen and a plate in her arm, which may contribute to the discomfort. She occasionally takes Tylenol for relief.  She has a history of diabetes and is mindful of her diet, avoiding starches and drinking mint milk. She takes her diabetes medication regularly and had an A1c of 7.5 in December. She is due for blood work to reassess her A1c levels. She also mentions not having received her diabetic shoes this year.  She takes alprazolam at night and is aware of potential insurance issues with this medication. She also takes gabapentin for her feet and  metformin twice a day.  Her daughter has a history of a heart attack and struggles with mobility, which impacts her social responsibilities, such as helping with the cemetery.       Review of Systems     Objective:   Physical Exam  General-in no acute distress Eyes-no discharge Lungs-respiratory rate normal, CTA CV-no murmurs,RRR Extremities skin warm dry no edema Neuro grossly normal Behavior normal, alert Diabetic foot exam good ability to sense monofilament 50% of the time In addition to this has bunions.      Assessment & Plan:  1. Primary hypertension (Primary) HTN- patient seen for follow-up regarding HTN.   Diet, medication compliance, appropriate labs and refills were completed.   Importance of keeping blood pressure under good control to lessen the risk of complications discussed Regular follow-up visits discussed  - Basic Metabolic Panel  2. Type 2 diabetes mellitus with stage 2 chronic kidney disease, without long-term current use of insulin (HCC) The patient was seen today as part of a comprehensive visit for diabetes. The importance of keeping her A1c at or below 7 range was discussed.  Discussed diet, activity, and medication compliance Emphasized healthy eating primarily with vegetables fruits and if utilizing meats lean meats such as chicken or fish grilled baked broiled Avoid sugary drinks Minimize and avoid processed foods Fit in regular physical activity preferably 25 to 30 minutes 4 times per week Standard follow-up visit recommended.  Patient aware lack of control and follow-up increases risk  of diabetic complications. Regular follow-up visits Yearly ophthalmology Yearly foot exam  - Hemoglobin A1c  3. Hyperlipidemia associated with type 2 diabetes mellitus (HCC) Hyperlipidemia-importance of diet, weight control, activity, compliance with medications discussed.   Recent labs reviewed.   Any additional labs or refills ordered.   Importance of  keeping under good control discussed. Regular follow-up visits discussed  - Lipid Panel  4. Left elbow pain Patient has had previous surgery x-ray ordered May need orthopedic consultation - DG Elbow Complete Left  5. Pain of left humerus Patient with pain in the distal humerus orthopedics may be necessary await x-ray - DG Humerus Left  6. Elevated alkaline phosphatase level Mild elevation of alkaline phosphatase check gamma GT on next blood draw - Gamma GT  7. Diabetic peripheral neuropathy (HCC) Patient is tolerating Neurontin well that is helping with the burning in her feet she should be able to qualify for diabetic shoes because of bunions bilateral as well as mild monofilament neuropathy  Patient also has anxiousness as well as insomnia issues.  This been going on for quite some time.  She has been on Xanax for years.  I informed her that insurance companies unlikely to pay for Xanax because of her age Also informed her that it would be in her best interest to start tapering the dose to a lower dose and perhaps even stop it in the future but this has to be done gradually over time so therefore we will go ahead and reduce the alprazolam to three fourths of a tablet each evening and discuss this further at follow-up in 6 months  Will send documentation to [facility] regarding diabetic shoes

## 2024-03-01 ENCOUNTER — Ambulatory Visit: Payer: 59 | Attending: Cardiovascular Disease | Admitting: Cardiovascular Disease

## 2024-03-04 ENCOUNTER — Other Ambulatory Visit: Payer: Self-pay | Admitting: Family Medicine

## 2024-03-05 ENCOUNTER — Ambulatory Visit (HOSPITAL_COMMUNITY)
Admission: RE | Admit: 2024-03-05 | Discharge: 2024-03-05 | Disposition: A | Discharge status: Home / Self Care | Source: Ambulatory Visit | Attending: Family Medicine | Admitting: Family Medicine

## 2024-03-05 ENCOUNTER — Other Ambulatory Visit: Payer: Self-pay

## 2024-03-05 DIAGNOSIS — M898X2 Other specified disorders of bone, upper arm: Secondary | ICD-10-CM | POA: Diagnosis not present

## 2024-03-05 DIAGNOSIS — M25522 Pain in left elbow: Secondary | ICD-10-CM | POA: Diagnosis not present

## 2024-03-05 DIAGNOSIS — M79622 Pain in left upper arm: Secondary | ICD-10-CM | POA: Diagnosis not present

## 2024-03-05 DIAGNOSIS — Z4789 Encounter for other orthopedic aftercare: Secondary | ICD-10-CM | POA: Diagnosis not present

## 2024-03-05 MED ORDER — ONDANSETRON HCL 8 MG PO TABS: ORAL_TABLET | ORAL | 3 refills | Status: DC

## 2024-03-05 MED ORDER — CETIRIZINE HCL 10 MG PO TABS: 10.0000 mg | ORAL_TABLET | Freq: Every day | ORAL | 3 refills | Status: DC

## 2024-03-11 ENCOUNTER — Encounter: Payer: Self-pay | Admitting: Family Medicine

## 2024-04-12 DIAGNOSIS — E1169 Type 2 diabetes mellitus with other specified complication: Secondary | ICD-10-CM | POA: Diagnosis not present

## 2024-04-12 DIAGNOSIS — I10 Essential (primary) hypertension: Secondary | ICD-10-CM | POA: Diagnosis not present

## 2024-04-12 DIAGNOSIS — N182 Chronic kidney disease, stage 2 (mild): Secondary | ICD-10-CM | POA: Diagnosis not present

## 2024-04-12 DIAGNOSIS — E1122 Type 2 diabetes mellitus with diabetic chronic kidney disease: Secondary | ICD-10-CM | POA: Diagnosis not present

## 2024-04-12 DIAGNOSIS — R748 Abnormal levels of other serum enzymes: Secondary | ICD-10-CM | POA: Diagnosis not present

## 2024-04-13 ENCOUNTER — Other Ambulatory Visit: Payer: Self-pay

## 2024-04-13 ENCOUNTER — Ambulatory Visit: Payer: Self-pay | Admitting: Family Medicine

## 2024-04-13 DIAGNOSIS — R7989 Other specified abnormal findings of blood chemistry: Secondary | ICD-10-CM

## 2024-04-13 LAB — BASIC METABOLIC PANEL WITH GFR
BUN/Creatinine Ratio: 10 — ABNORMAL LOW (ref 12–28)
BUN: 11 mg/dL (ref 8–27)
CO2: 23 mmol/L (ref 20–29)
Calcium: 9.2 mg/dL (ref 8.7–10.3)
Chloride: 101 mmol/L (ref 96–106)
Creatinine, Ser: 1.07 mg/dL — ABNORMAL HIGH (ref 0.57–1.00)
Glucose: 137 mg/dL — ABNORMAL HIGH (ref 70–99)
Potassium: 5 mmol/L (ref 3.5–5.2)
Sodium: 140 mmol/L (ref 134–144)
eGFR: 52 mL/min/{1.73_m2} — ABNORMAL LOW (ref >59)

## 2024-04-13 LAB — LIPID PANEL
Chol/HDL Ratio: 3.5 ratio (ref 0.0–4.4)
Cholesterol, Total: 190 mg/dL (ref 100–199)
HDL: 54 mg/dL (ref >39)
LDL Chol Calc (NIH): 114 mg/dL — ABNORMAL HIGH (ref 0–99)
Triglycerides: 121 mg/dL (ref 0–149)
VLDL Cholesterol Cal: 22 mg/dL (ref 5–40)

## 2024-04-13 LAB — HEMOGLOBIN A1C
Est. average glucose Bld gHb Est-mCnc: 157 mg/dL
Hgb A1c MFr Bld: 7.1 % — ABNORMAL HIGH (ref 4.8–5.6)

## 2024-04-13 LAB — GAMMA GT: GGT: 32 IU/L (ref 0–60)

## 2024-05-04 LAB — OPHTHALMOLOGY REPORT-SCANNED

## 2024-05-18 ENCOUNTER — Other Ambulatory Visit: Payer: Self-pay | Admitting: Cardiovascular Disease

## 2024-06-05 DIAGNOSIS — E119 Type 2 diabetes mellitus without complications: Secondary | ICD-10-CM | POA: Diagnosis not present

## 2024-07-05 ENCOUNTER — Other Ambulatory Visit: Payer: Self-pay | Admitting: Family Medicine

## 2024-07-07 ENCOUNTER — Telehealth: Payer: Self-pay | Admitting: Urology

## 2024-07-07 NOTE — Telephone Encounter (Signed)
 Return call to patient and made her aware she will need a follow up office visited for Rx for Gemtesa . Patient scheduled and voiced understanding.

## 2024-07-07 NOTE — Progress Notes (Signed)
 History of Present Illness: Elizabeth Aguilar is a 83 y.o. year old female here for f/u of recurrent UTIs as well as OAB.  Previously she had seen Elizabeth Oz, NP.  For her urinary tract infections, it was recommended that she use perivaginal estrogen cream.  For OAB, she was started on Gemtesa .  Since her visit just under a year ago, she still takes the Gemtesa .  Her bladder function is quite good for her.  She denies significant leakage and her frequency and urgency is less.  She does not use the estrogen cream 2 nights a week.    Past Medical History:  Diagnosis Date   CAD (coronary artery disease)    a. s/p STEMI in 2015 with DES to LAD and stgaed PCI with DES to RCA   Chest pain 10/10/2014   Chronic combined systolic and diastolic congestive heart failure (HCC)    a. 03/2017: echo showing mildly reduced EF of 40-45% with Grade 1 DD and mild to moderate MR.    Hyperlipidemia    Irritable bowel syndrome    Myocardial infarction (HCC)    Osteoporosis 04/14/2022   Detected on bone density, Fosamax  started, June 2023   Pericarditis    Type 2 diabetes mellitus [facility])     Past Surgical History:  Procedure Laterality Date   CATARACT EXTRACTION W/PHACO Left 09/21/2018   Procedure: CATARACT EXTRACTION PHACO AND INTRAOCULAR LENS PLACEMENT LEFT EYE;  Surgeon: Perley Hamilton, MD;  Location: [facility];  Service: Ophthalmology;  Laterality: Left;  CDE: 14.50   CATARACT EXTRACTION W/PHACO Right 10/05/2018   Procedure: CATARACT EXTRACTION PHACO AND INTRAOCULAR LENS PLACEMENT (IOC);  Surgeon: Perley Hamilton, MD;  Location: [facility];  Service: Ophthalmology;  Laterality: Right;  CDE: 11.48   CORONARY ANGIOPLASTY WITH STENT PLACEMENT     LEFT HEART CATHETERIZATION WITH CORONARY ANGIOGRAM N/A 12/16/2013   Procedure: LEFT HEART CATHETERIZATION WITH CORONARY ANGIOGRAM;  Surgeon: Debby DELENA Sor, MD;  Location: [facility] CATH LAB;  Service: Cardiovascular;  Laterality: N/A;   LEFT HEART CATHETERIZATION WITH CORONARY  ANGIOGRAM  12/17/2013   Procedure: LEFT HEART CATHETERIZATION WITH CORONARY ANGIOGRAM;  Surgeon: Debby DELENA Sor, MD;  Location: [facility] CATH LAB;  Service: Cardiovascular;;   PERCUTANEOUS CORONARY STENT INTERVENTION (PCI-S) N/A 12/17/2013   Procedure: PERCUTANEOUS CORONARY STENT INTERVENTION (PCI-S);  Surgeon: Debby DELENA Sor, MD;  Location: [facility] CATH LAB;  Service: Cardiovascular;  Laterality: N/A;    Home Medications:  (Not in a hospital admission)   Allergies:  Allergies  Allergen Reactions   Augmentin  [Amoxicillin -Pot Clavulanate] Other (See Comments)    Reaction:  All over body pain  Has patient had a PCN reaction causing immediate rash, facial/tongue/throat swelling, SOB or lightheadedness with hypotension: No Has patient had a PCN reaction causing severe rash involving mucus membranes or skin necrosis: No Has patient had a PCN reaction that required hospitalization: No Has patient had a PCN reaction occurring within the last 10 years: Yes If all of the above answers are NO, then may proceed with Cephalosporin use.   Ceftin [Cefuroxime Axetil] Nausea And Vomiting   Darvocet [Propoxyphene N-Acetaminophen ] Nausea And Vomiting   Doxycycline Nausea And Vomiting and Other (See Comments)    Reaction:  All over body pain   Erythromycin Nausea And Vomiting   Levofloxacin  Other (See Comments)    Reaction:  All over body pain    Sulfa Antibiotics Nausea And Vomiting and Other (See Comments)    Reaction:  All over body pain  Sulfamethoxazole  Other (See Comments)   Vioxx [Rofecoxib] Nausea And Vomiting   Zetia  [Ezetimibe ] Other (See Comments)    Reaction:  Cramping   Zithromax [Azithromycin] Nausea And Vomiting   Zocor  [Simvastatin ] Other (See Comments)    Reaction:  Leg cramps     Family History  Problem Relation Age of Onset   Diabetes Mother    Diabetes Daughter    Diabetes Son     Social History:  reports that she has never smoked. She has never used smokeless tobacco. She reports  that she does not drink alcohol and does not use drugs.  ROS: A complete review of systems was performed.  All systems are negative except for pertinent findings as noted.    I have reviewed prior pt notes  I have reviewed urinalysis results--clear today    Impression/Assessment:  1.  OAB, doing well on Gemtesa   2.  Recurring urinary tract infections with vaginal atrophic change, on sporadic perivaginal estrogen  Plan:  1.  Gemtesa  refilled, I told her that she should use the estrogen cream 2 nights a week.  That was refilled as well  2.  Office visit 1 year  Elizabeth Aguilar Shack 07/07/2024, 7:30 PM  Elizabeth Aguilar. Elizabeth Cerrito MD

## 2024-07-07 NOTE — Telephone Encounter (Signed)
 Needs Gemtesa  sent to [facility]

## 2024-07-08 ENCOUNTER — Encounter: Payer: Self-pay | Admitting: Urology

## 2024-07-08 ENCOUNTER — Ambulatory Visit (INDEPENDENT_AMBULATORY_CARE_PROVIDER_SITE_OTHER): Admitting: Urology

## 2024-07-08 VITALS — BP 134/74 | HR 88

## 2024-07-08 DIAGNOSIS — N390 Urinary tract infection, site not specified: Secondary | ICD-10-CM | POA: Diagnosis not present

## 2024-07-08 DIAGNOSIS — N3281 Overactive bladder: Secondary | ICD-10-CM | POA: Diagnosis not present

## 2024-07-08 DIAGNOSIS — Z87440 Personal history of urinary (tract) infections: Secondary | ICD-10-CM | POA: Diagnosis not present

## 2024-07-08 DIAGNOSIS — R3915 Urgency of urination: Secondary | ICD-10-CM

## 2024-07-08 DIAGNOSIS — R350 Frequency of micturition: Secondary | ICD-10-CM | POA: Diagnosis not present

## 2024-07-08 DIAGNOSIS — N952 Postmenopausal atrophic vaginitis: Secondary | ICD-10-CM

## 2024-07-08 DIAGNOSIS — N3941 Urge incontinence: Secondary | ICD-10-CM

## 2024-07-08 LAB — URINALYSIS, ROUTINE W REFLEX MICROSCOPIC
Bilirubin, UA: NEGATIVE
Glucose, UA: NEGATIVE
Ketones, UA: NEGATIVE
Leukocytes,UA: NEGATIVE
Nitrite, UA: NEGATIVE
Protein,UA: NEGATIVE
RBC, UA: NEGATIVE
Specific Gravity, UA: 1.01 (ref 1.005–1.030)
Urobilinogen, Ur: 0.2 mg/dL (ref 0.2–1.0)
pH, UA: 6 (ref 5.0–7.5)

## 2024-07-08 MED ORDER — GEMTESA 75 MG PO TABS
1.0000 | ORAL_TABLET | Freq: Every day | ORAL | 3 refills | Status: AC
Start: 2024-07-08 — End: ?

## 2024-07-08 MED ORDER — ESTRADIOL 0.1 MG/GM VA CREA: TOPICAL_CREAM | VAGINAL | 3 refills | Status: AC

## 2024-08-16 ENCOUNTER — Ambulatory Visit: Admitting: Internal Medicine

## 2024-08-23 ENCOUNTER — Other Ambulatory Visit: Payer: Self-pay | Admitting: Family Medicine

## 2024-08-26 ENCOUNTER — Ambulatory Visit (INDEPENDENT_AMBULATORY_CARE_PROVIDER_SITE_OTHER): Admitting: Family Medicine

## 2024-08-26 ENCOUNTER — Encounter: Payer: Self-pay | Admitting: Family Medicine

## 2024-08-26 VITALS — BP 132/78 | HR 89 | Temp 97.7°F | Ht 63.0 in | Wt 153.2 lb

## 2024-08-26 DIAGNOSIS — G4700 Insomnia, unspecified: Secondary | ICD-10-CM

## 2024-08-26 DIAGNOSIS — E1169 Type 2 diabetes mellitus with other specified complication: Secondary | ICD-10-CM | POA: Diagnosis not present

## 2024-08-26 DIAGNOSIS — E785 Hyperlipidemia, unspecified: Secondary | ICD-10-CM

## 2024-08-26 DIAGNOSIS — Z79899 Other long term (current) drug therapy: Secondary | ICD-10-CM

## 2024-08-26 DIAGNOSIS — N1831 Chronic kidney disease, stage 3a: Secondary | ICD-10-CM | POA: Diagnosis not present

## 2024-08-26 DIAGNOSIS — I10 Essential (primary) hypertension: Secondary | ICD-10-CM

## 2024-08-26 DIAGNOSIS — N182 Chronic kidney disease, stage 2 (mild): Secondary | ICD-10-CM

## 2024-08-26 DIAGNOSIS — E1122 Type 2 diabetes mellitus with diabetic chronic kidney disease: Secondary | ICD-10-CM | POA: Diagnosis not present

## 2024-08-26 MED ORDER — FUROSEMIDE 20 MG PO TABS: ORAL_TABLET | ORAL | 1 refills | Status: AC

## 2024-08-26 MED ORDER — AMLODIPINE BESYLATE 5 MG PO TABS
5.0000 mg | ORAL_TABLET | Freq: Every day | ORAL | 3 refills | Status: AC
Start: 2024-08-26 — End: ?

## 2024-08-26 MED ORDER — ESOMEPRAZOLE MAGNESIUM 40 MG PO CPDR: DELAYED_RELEASE_CAPSULE | ORAL | 1 refills | Status: AC

## 2024-08-26 MED ORDER — METFORMIN HCL 500 MG PO TABS
ORAL_TABLET | ORAL | 1 refills | Status: AC
Start: 2024-08-26 — End: ?

## 2024-08-26 MED ORDER — ATORVASTATIN CALCIUM 40 MG PO TABS: ORAL_TABLET | ORAL | 7 refills | Status: AC

## 2024-08-26 MED ORDER — ALPRAZOLAM 0.5 MG PO TABS: ORAL_TABLET | ORAL | 3 refills | Status: AC

## 2024-08-26 NOTE — Progress Notes (Signed)
   Subjective:    Patient ID: Elizabeth Aguilar, female    DOB: Mar 18, 1941, 83 y.o.   MRN: [id number]  HPI Pt. In room 7.  6 month follow up Discussed the use of AI scribe software for clinical note transcription with the patient, who gave verbal consent to proceed.  History of Present Illness   Elizabeth Aguilar is an 83 year old female who presents for a routine follow-up visit.  She is in the process of moving to a new home purchased by her son, which has been modified for her safety with features like a walk-in shower and vinyl flooring. The moving activities have resulted in some bruising on her hands.  She manages her daily activities independently, including cooking and grocery shopping. She avoids driving at night, having done so only once in the past year. Her energy level is fair, her appetite is good, and her bowel movements are regular. No breathing issues while walking are reported.  She takes Xanax  at night to aid with sleep, as she finds it difficult to sleep without it. It does not cause morning grogginess or unusual nighttime behavior. Attempts to reduce the dosage have been unsuccessful due to insomnia.  She has not consumed soda in the past four to five months, attributing her weight loss from the 160s to 153 pounds to this change. Her last A1c was 7.1 in the summer, and she reports a home A1c test result of 5-something a couple of months ago.  She had her eyes checked earlier this year and reports no issues with her feet, including no falls or injuries. She continues to take her fluid pill in the morning and denies any recent heartburn problems.     As for her diet good job staying away from soda getting it walking trying to stay active try to watch her diet Insomnia she relates she cannot sleep without the Xanax  we did discuss safety measures Lipidemia continue current measures-additional labs ordered to be done before her follow-up visit preferably in December if  possible Review of Systems     Objective:   Physical Exam  Bno General-in no acute distress Eyes-no discharge Lungs-respiratory rate normal, CTA CV-no murmurs,RRR Extremities skin warm dry no edema Neuro grossly normal Behavior normal, alert Diabetic foot exam normal, mild bunions      Assessment & Plan:  1. Primary hypertension (Primary) Blood pressure under good control continue current measures refills given healthy diet  2. Type 2 diabetes mellitus with stage 2 chronic kidney disease, without long-term current use of insulin  (HCC) Staying away from sodas, trying to eat healthy and stay active, previous A1c looks good we will reorder A1c and await those findings  3. Hyperlipidemia associated with type 2 diabetes mellitus (HCC) Continue current medication, LDL goal less than 70 if possible less than 55  4. Type 2 diabetes mellitus with stage 3a chronic kidney disease, without long-term current use of insulin  (HCC) Healthy diet regular activity and continue decent fluid intake  5. Insomnia, unspecified type Patient states she tried to taper her Xanax  but could not I do not as she was having significant insomnia we discussed this for a while she seems to be tolerating low-dose Xanax  she has been on this for years we will give her refills she denies daytime drowsiness or feeling loopy with that  6. High risk medication use Labs ordered

## 2024-08-29 ENCOUNTER — Other Ambulatory Visit: Payer: Self-pay | Admitting: Family Medicine

## 2024-08-29 DIAGNOSIS — J189 Pneumonia, unspecified organism: Secondary | ICD-10-CM

## 2024-09-13 ENCOUNTER — Ambulatory Visit: Admitting: Internal Medicine

## 2024-10-01 ENCOUNTER — Other Ambulatory Visit: Payer: Self-pay

## 2024-10-01 ENCOUNTER — Other Ambulatory Visit: Payer: Self-pay | Admitting: Family Medicine

## 2024-10-01 MED ORDER — CETIRIZINE HCL 10 MG PO TABS: 10.0000 mg | ORAL_TABLET | Freq: Every day | ORAL | 2 refills | Status: AC

## 2024-10-01 MED ORDER — AZELASTINE HCL 0.1 % NA SOLN: 2.0000 | Freq: Two times a day (BID) | NASAL | 12 refills | Status: AC

## 2024-10-05 ENCOUNTER — Ambulatory Visit (HOSPITAL_COMMUNITY)
Admission: RE | Admit: 2024-10-05 | Discharge: 2024-10-05 | Disposition: A | Discharge status: Home / Self Care | Source: Ambulatory Visit

## 2024-10-05 ENCOUNTER — Ambulatory Visit: Admitting: Nurse Practitioner

## 2024-10-05 ENCOUNTER — Ambulatory Visit: Payer: Self-pay

## 2024-10-05 VITALS — BP 139/78 | Ht 63.0 in | Wt 150.0 lb

## 2024-10-05 DIAGNOSIS — L989 Disorder of the skin and subcutaneous tissue, unspecified: Secondary | ICD-10-CM

## 2024-10-05 DIAGNOSIS — M79671 Pain in right foot: Secondary | ICD-10-CM | POA: Diagnosis not present

## 2024-10-05 NOTE — Telephone Encounter (Signed)
 FYI Only or Action Required?: FYI only for provider: appointment scheduled on 10/06/2024.  Patient was last seen in primary care on 08/26/2024 by Alphonsa Glendia LABOR, MD.  Called Nurse Triage reporting Foot Pain.  Symptoms began several weeks ago.  Interventions attempted: OTC medications: tylenol  and Rest, hydration, or home remedies.  Symptoms are: unchanged.  Triage Disposition: See PCP When Office is Open (Within 3 Days)  Patient/caregiver understands and will follow disposition?: Yes   Copied from CRM [id number]. Topic: Clinical - Red Word Triage >> Oct 05, 2024  2:20 PM Elizabeth Aguilar wrote: Red Word that prompted transfer to Nurse Triage:  On 09/24/24, Elizabeth Aguilar was getting out of the car and her right foot got caught in her daughter's purse and Elizabeth Aguilar fell out of the car. She could not walk after sitting for a while. It is not getting better; pain is around a 5 when walking. Reason for Disposition  [1] MODERATE pain (e.g., interferes with normal activities, limping) AND [2] present > 3 days  Answer Assessment - Initial Assessment Questions Pt reports falling 09/24/24 when getting out of a car. She reports all pain from the fall has improved except her R foot. She is worried d/t being a diabetic and a blister has formed on her R foot between her toes. She stated she is also worried her foot may be broken as it is still painful but most of the bruising has resolved. Pt taking tylenol  but prefers to keep foot elevated over taking medications. Due to fall and ongoing pain, RN recommended making an appt. Appointment scheduled for evaluation. Patient agrees with plan of care, and will call back if anything changes, or if symptoms worsen.   1. ONSET: When did the pain start?      11/14  2. LOCATION: Where is the pain located?      Right foot; blister between third digit and second digit   3. PAIN: How bad is the pain?    (Scale 1-10; or mild, moderate, severe)     5/10; dull ache   4. WORK  OR EXERCISE: Has there been any recent work or exercise that involved this part of the body?      Fall 11/14  5. CAUSE: What do you think is causing the foot pain?     Fall 11/14 causing foot to bruise and was unable to walk afterwards. Now dull ache, walking without assistance  6. OTHER SYMPTOMS: Do you have any other symptoms? (e.g., leg pain, rash, fever, numbness)     None  Protocols used: Foot Pain-A-AH

## 2024-10-05 NOTE — Patient Instructions (Signed)
 Voltaren gel

## 2024-10-05 NOTE — Progress Notes (Unsigned)
 Acute Office Visit  Subjective:     Patient ID: Elizabeth Aguilar, female    DOB: 05/24/1941, 83 y.o.   MRN: [id number]  Chief Complaint  Patient presents with   Foot Swelling    S/P fall    HPI Consuelo presents today for right foot pain after a fall two weeks ago. Reports that she got her foot caught on her purse getting out of the car, and fell out of the car. No direct impact to foot. Patient said she could not put weight on foot for the rest of the day, and has had severe bruising and swelling. Describes the pain as aching, and is located mostly on the top of the foot. Denies any numbness or tingling. Had a lesion appear on the third toe of the right foot after the fall and is concerned due to a history of diabetes. Reports that she cannot take ibuprofen due to her heart, or tylenol  due to her liver. Has been elevating and avoiding walking on the foot. Has been soaking foot in warm water for relief, and applying neosporin to lesion.   Review of Systems  Constitutional:  Negative for chills and fever.  Respiratory:  Negative for cough and shortness of breath.   Cardiovascular:  Negative for chest pain.  Musculoskeletal:  Positive for falls.       Positive for right foot pain.       Objective:    Today's Vitals   10/05/24 1514  BP: 139/78  Weight: 150 lb (68 kg)  Height: 5' 3 (1.6 m)   Body mass index is 26.57 kg/m.   Physical Exam Constitutional:      General: She is not in acute distress.    Appearance: Normal appearance. She is not ill-appearing.  Cardiovascular:     Rate and Rhythm: Normal rate and regular rhythm.     Heart sounds: Normal heart sounds. No murmur heard. Pulmonary:     Effort: Pulmonary effort is normal. No respiratory distress.     Breath sounds: Normal breath sounds.  Musculoskeletal:     Right foot: Normal capillary refill. Swelling present. No deformity. Normal pulse.     Left foot: Normal capillary refill. No swelling or deformity. Normal pulse.      Comments: Right foot: Ecchymosis noted on the medial aspect of the right foot near the great toe, and on dorsal aspect of great toe. No open wounds or obvious deformity to the area. Mild swelling noted on the dorsal portion of the right foot. Tenderness withe palpation is noted over the dorsolateral aspect of the right foot, and over the distal aspects of the third, fourth and fifth toes. Good ROM and strength in the right ankle. Pain elicited in 3rd-5th toes with dorsiflexion of the foot. Gait antalgic with walking.   Skin:    Comments: A solitary, erythematous vesicle measuring less than 1 cm located on the dorsal aspect of the third metatarsal, at the proximal portion. See picture in chart.   Neurological:     Mental Status: She is alert.  Psychiatric:        Mood and Affect: Mood normal.        Behavior: Behavior normal.        Thought Content: Thought content normal.        Judgment: Judgment normal.        Assessment & Plan:  1. Right foot pain (Primary) -Due to patient's history of osteoporosis, and no improvement in symptoms after  two weeks, this warrants an x-ray at this time. Results pending.  -Advised patient to apply OTC topical Voltaren gel on top of the foot as needed for pain. Provided name of medication in check-out sheet. Recommended to continue to avoid using OTC ibuprofen or tylenol  due to potential side effects.  -Educated patient to continue elevating the foot, and applying heat or ice to the area as tolerated. Agrees with plan.  - DG Foot Complete Right  2. Skin lesion of right lower extremity -Due to the lesion being contained and not punctured with no signs of infection, I am not concerned for an infected lesion at this time. Appears to be a lesion that appeared due to inflammation.  -Advised patient that if she develops a fever, or the lesion starts to have drainage that she will need to have it evaluated at that time.   Return if symptoms worsen or fail to  improve.   Damien KATHEE Pringle, FNP

## 2024-10-06 ENCOUNTER — Encounter: Payer: Self-pay | Admitting: Nurse Practitioner

## 2024-10-06 ENCOUNTER — Ambulatory Visit

## 2024-10-06 DIAGNOSIS — M79671 Pain in right foot: Secondary | ICD-10-CM

## 2024-10-06 NOTE — Progress Notes (Signed)
 Please call and let the patient know that she does have fractures of the second, third and fourth toes. We will be prescribing her a boot to walk in, and please ask which pharmacy she would like us  to send this to. Needs to be a pharmacy with medical supplies. I put in an urgent referral to orthopedics, and will need to get seen within two weeks.

## 2024-10-06 NOTE — Telephone Encounter (Signed)
 Copied from CRM #[id number]. Topic: Clinical - Order For Equipment >> Oct 06, 2024  1:23 PM Montie POUR wrote: Reason for CRM:  Teisha is calling to see where the order for the boot was sent to. Please call her at [phone] to discuss the boot. Thanks

## 2024-10-18 ENCOUNTER — Other Ambulatory Visit: Payer: Self-pay | Admitting: Family Medicine

## 2024-10-21 NOTE — Telephone Encounter (Signed)
 May have 4 refills of Zofran  As for the gabapentin  currently take does not have this on her list-please refuse

## 2024-10-25 ENCOUNTER — Other Ambulatory Visit: Payer: Self-pay | Admitting: Family Medicine

## 2024-10-25 MED ORDER — ONDANSETRON HCL 8 MG PO TABS: ORAL_TABLET | ORAL | 3 refills | Status: AC

## 2024-11-10 NOTE — Progress Notes (Signed)
 Elizabeth Aguilar                                          MRN: [id number]   11/10/2024   The [facility] Quality Team Specialist reviewed this patient medical record for the purposes of chart review for care gap closure. The following were reviewed: chart review for care gap closure-kidney health evaluation for diabetes:eGFR  and uACR.    [facility] Quality Team

## 2024-11-12 NOTE — Progress Notes (Signed)
 "  Acute Office Visit  Subjective:     Patient ID: Elizabeth Aguilar Husbands, female    DOB: 24-Feb-1941, 84 y.o.   MRN: [id number]  Chief Complaint  Patient presents with   Foot Swelling    S/P fall    HPI Elizabeth Aguilar presents today for right foot pain after a fall two weeks ago. Reports that she got her foot caught on her purse getting out of the car, and fell out of the car. No direct impact to foot. Patient said she could not put weight on foot for the rest of the day, and has had severe bruising and swelling. Describes the pain as aching, and is located mostly on the top of the foot. Denies any numbness or tingling. Had a lesion appear on the third toe of the right foot after the fall and is concerned due to a history of diabetes. Reports that she cannot take ibuprofen due to her heart, or tylenol  due to her liver. Has been elevating and avoiding walking on the foot. Has been soaking foot in warm water for relief, and applying neosporin to lesion.   Review of Systems  Constitutional:  Negative for chills and fever.  Respiratory:  Negative for cough and shortness of breath.   Cardiovascular:  Negative for chest pain.  Musculoskeletal:  Positive for falls.       Positive for right foot pain.       Objective:    Today's Vitals   10/05/24 1514  BP: 139/78  Weight: 150 lb (68 kg)  Height: 5' 3 (1.6 m)   Body mass index is 26.57 kg/m.   Physical Exam Constitutional:      General: She is not in acute distress.    Appearance: Normal appearance. She is not ill-appearing.  Cardiovascular:     Rate and Rhythm: Normal rate and regular rhythm.     Heart sounds: Normal heart sounds. No murmur heard. Pulmonary:     Effort: Pulmonary effort is normal. No respiratory distress.     Breath sounds: Normal breath sounds.  Musculoskeletal:     Right foot: Normal capillary refill. Swelling present. No deformity. Normal pulse.     Left foot: Normal capillary refill. No swelling or deformity. Normal pulse.      Comments: Right foot: Ecchymosis noted on the medial aspect of the right foot near the great toe, and on dorsal aspect of great toe. No open wounds or obvious deformity to the area. Mild swelling noted on the dorsal portion of the right foot. Tenderness withe palpation is noted over the dorsolateral aspect of the right foot, and over the distal aspects of the third, fourth and fifth toes. Good ROM and strength in the right ankle. Pain elicited in 3rd-5th toes with dorsiflexion of the foot. Gait antalgic with walking.   Skin:    Comments: A solitary, erythematous vesicle measuring less than 1 cm located on the dorsal aspect of the third metatarsal, at the proximal portion. See picture in chart.   Neurological:     Mental Status: She is alert.  Psychiatric:        Mood and Affect: Mood normal.        Behavior: Behavior normal.        Thought Content: Thought content normal.        Judgment: Judgment normal.        Assessment & Plan:  1. Right foot pain (Primary) -Due to patient's history of osteoporosis, and no improvement in symptoms  after two weeks, this warrants an x-ray at this time. Results pending.  -Advised patient to apply OTC topical Voltaren gel on top of the foot as needed for pain. Provided name of medication in check-out sheet. Recommended to continue to avoid using OTC ibuprofen or tylenol  due to potential side effects.  -Educated patient to continue elevating the foot, and applying heat or ice to the area as tolerated. Agrees with plan.  - DG Foot Complete Right  2. Skin lesion of right lower extremity -Due to the lesion being contained and not punctured with no signs of infection, I am not concerned for an infected lesion at this time. Appears to be a lesion that appeared due to inflammation.  -Advised patient that if she develops a fever, or the lesion starts to have drainage that she will need to have it evaluated at that time.   Return if symptoms worsen or fail to  improve.   Damien KATHEE Pringle, FNP  Addendum:  Patient was seen at [facility] on 10/05/2024 under the supervision of Elveria Quarry, NP. Originally entered in error.   Thanks, Damien Pringle, FNP-BC  "

## 2024-11-18 ENCOUNTER — Ambulatory Visit: Admitting: Internal Medicine

## 2024-11-19 ENCOUNTER — Ambulatory Visit: Payer: 59

## 2024-11-19 VITALS — Ht 63.0 in | Wt 150.0 lb

## 2024-11-19 DIAGNOSIS — Z0000 Encounter for general adult medical examination without abnormal findings: Secondary | ICD-10-CM | POA: Diagnosis not present

## 2024-11-19 NOTE — Progress Notes (Signed)
 "  Chief Complaint  Patient presents with   Medicare Wellness     Subjective:   Elizabeth Aguilar is a 84 y.o. female who presents for a Medicare Annual Wellness Visit.  Visit info / Clinical Intake: Medicare Wellness Visit Type:: Subsequent Annual Wellness Visit Persons participating in visit and providing information:: patient Medicare Wellness Visit Mode:: Telephone If telephone:: video declined Since this visit was completed virtually, some vitals may be partially provided or unavailable. Missing vitals are due to the limitations of the virtual format.: Documented vitals are patient reported If Telephone or Video please confirm:: I connected with patient using audio/video enable telemedicine. I verified patient identity with two identifiers, discussed telehealth limitations, and patient agreed to proceed. Patient Location:: home Provider Location:: office Interpreter Needed?: No Pre-visit prep was completed: yes AWV questionnaire completed by patient prior to visit?: no Living arrangements:: (!) lives alone Patient's Overall Health Status Rating: good Typical amount of pain: some Does pain affect daily life?: no Are you currently prescribed opioids?: no  Dietary Habits and Nutritional Risks How many meals a day?: 3 Eats fruit and vegetables daily?: yes Most meals are obtained by: preparing own meals In the last 2 weeks, have you had any of the following?: none Diabetic:: (!) yes Any non-healing wounds?: no How often do you check your BS?: as needed Would you like to be referred to a Nutritionist or for Diabetic Management? : no  Functional Status Activities of Daily Living (to include ambulation/medication): Independent Ambulation: Independent Medication Administration: Independent Home Management (perform basic housework or laundry): Independent Manage your own finances?: yes Primary transportation is: driving; family / friends Concerns about vision?: no *vision screening  is required for WTM* Concerns about hearing?: no  Fall Screening Falls in the past year?: 1 Number of falls in past year: 0 Was there an injury with Fall?: 1 Fall Risk Category Calculator: 2 Patient Fall Risk Level: Moderate Fall Risk  Fall Risk Patient at Risk for Falls Due to: History of fall(s) Fall risk Follow up: Falls evaluation completed; Education provided; Falls prevention discussed  Home and Transportation Safety: All rugs have non-skid backing?: yes All stairs or steps have railings?: yes Grab bars in the bathtub or shower?: yes Have non-skid surface in bathtub or shower?: yes Good home lighting?: yes Regular seat belt use?: yes Hospital stays in the last year:: no  Cognitive Assessment Difficulty concentrating, remembering, or making decisions? : no Will 6CIT or Mini Cog be Completed: yes What year is it?: 0 points What month is it?: 0 points Give patient an address phrase to remember (5 components): [address] [place] About what time is it?: 0 points Count backwards from 20 to 1: 0 points Say the months of the year in reverse: 0 points Repeat the address phrase from earlier: 2 points 6 CIT Score: 2 points  Advance Directives (For Healthcare) Does Patient Have a Medical Advance Directive?: No Would patient like information on creating a medical advance directive?: Yes (MAU/Ambulatory/Procedural Areas - Information given)  Reviewed/Updated  Reviewed/Updated: Reviewed All (Medical, Surgical, Family, Medications, Allergies, Care Teams, Patient Goals)    Allergies (verified) Augmentin  [amoxicillin -pot clavulanate], Ceftin [cefuroxime axetil], Darvocet [propoxyphene n-acetaminophen ], Doxycycline, Erythromycin, Levofloxacin , Sulfa antibiotics, Sulfamethoxazole , Vioxx [rofecoxib], Zetia  [ezetimibe ], Zithromax [azithromycin], and Zocor  [simvastatin ]   Current Medications (verified) Outpatient Encounter Medications as of 11/19/2024  Medication Sig    albuterol  (VENTOLIN  HFA) 108 (90 Base) MCG/ACT inhaler USE 2 PUFFS EVERY 6 HOURS AS NEEDED   ALPRAZolam  (XANAX )  0.5 MG tablet TAKE 1/2 TABLET DURING THE DAY & TAKE 1 TABLET AT BEDTIME AS NEEDED   amLODipine  (NORVASC ) 5 MG tablet Take 1 tablet (5 mg total) by mouth daily.   aspirin  EC 81 MG EC tablet Take 1 tablet (81 mg total) by mouth daily.   atorvastatin  (LIPITOR) 40 MG tablet TAKE 1 TABLET DAILY AT 6PM   azelastine  (ASTELIN ) 0.1 % nasal spray Place 2 sprays into both nostrils 2 (two) times daily.   cetirizine  (ZYRTEC ) 10 MG tablet Take 1 tablet (10 mg total) by mouth daily.   esomeprazole  (NEXIUM ) 40 MG capsule TAKE ONE (1) CAPSULE EACH DAY   estradiol  (ESTRACE ) 0.1 MG/GM vaginal cream Insert a blueberry size amount (approximately 1 gram) of cream on fingertip inside vagina at bedtime 2 nights per week for long term use.   fluticasone  (FLONASE ) 50 MCG/ACT nasal spray Place 2 sprays into both nostrils daily.   furosemide  (LASIX ) 20 MG tablet TAKE 2 TABLETS EVERY MORNING UNTIL THE SWELLING GOES DOWN THEN TAKE 1 TABLET EVERY MORNING   metFORMIN  (GLUCOPHAGE ) 500 MG tablet TAKE ONE (1) TABLET BY MOUTH TWO (2) TIMES DAILY   metoprolol  succinate (TOPROL -XL) 50 MG 24 hr tablet TAKE 1&1/2 TABLET EVERY MORNING & TAKE 1 TABLET EVERY EVENING TAKE WITH FOOD   Multiple Vitamin (MULTIVITAMIN WITH MINERALS) TABS tablet Take 1 tablet by mouth daily.   nitroGLYCERIN  (NITROSTAT ) 0.4 MG SL tablet Place 1 tablet (0.4 mg total) under the tongue every 5 (five) minutes x 3 doses as needed for chest pain.   ondansetron  (ZOFRAN ) 8 MG tablet TAKE ONE TABLET EVERY 8 HOURS AS NEEDED   Vibegron  (GEMTESA ) 75 MG TABS Take 1 tablet (75 mg total) by mouth daily.   No facility-administered encounter medications on file as of 11/19/2024.    History: Past Medical History:  Diagnosis Date   CAD (coronary artery disease)    a. s/p STEMI in 2015 with DES to LAD and stgaed PCI with DES to RCA   Chest pain 10/10/2014   Chronic  combined systolic and diastolic congestive heart failure (HCC)    a. 03/2017: echo showing mildly reduced EF of 40-45% with Grade 1 DD and mild to moderate MR.    Hyperlipidemia    Irritable bowel syndrome    Myocardial infarction (HCC)    Osteoporosis 04/14/2022   Detected on bone density, Fosamax  started, June 2023   Pericarditis    Type 2 diabetes mellitus [facility])    Past Surgical History:  Procedure Laterality Date   CATARACT EXTRACTION W/PHACO Left 09/21/2018   Procedure: CATARACT EXTRACTION PHACO AND INTRAOCULAR LENS PLACEMENT LEFT EYE;  Surgeon: Perley Hamilton, MD;  Location: [facility];  Service: Ophthalmology;  Laterality: Left;  CDE: 14.50   CATARACT EXTRACTION W/PHACO Right 10/05/2018   Procedure: CATARACT EXTRACTION PHACO AND INTRAOCULAR LENS PLACEMENT (IOC);  Surgeon: Perley Hamilton, MD;  Location: [facility];  Service: Ophthalmology;  Laterality: Right;  CDE: 11.48   CORONARY ANGIOPLASTY WITH STENT PLACEMENT     LEFT HEART CATHETERIZATION WITH CORONARY ANGIOGRAM N/A 12/16/2013   Procedure: LEFT HEART CATHETERIZATION WITH CORONARY ANGIOGRAM;  Surgeon: Debby DELENA Sor, MD;  Location[facility] CATH LAB;  Service: Cardiovascular;  Laterality: N/A;   LEFT HEART CATHETERIZATION WITH CORONARY ANGIOGRAM  12/17/2013   Procedure: LEFT HEART CATHETERIZATION WITH CORONARY ANGIOGRAM;  Surgeon: Debby DELENA Sor, MD;  Location[facility] C[facility] LAB;  Service: Cardiovascular;;   PERCUTANEOUS CORONARY STENT INTERVENTION (PCI-S) N/A 12/17/2013   Procedure: PERCUTANEOUS CORONARY STENT INTERVENTION (PCI-S);  Surgeon: Debby DELENA Sor, MD;  Location: [facility] CATH LAB;  Service: Cardiovascular;  Laterality: N/A;   Family History  Problem Relation Age of Onset   Diabetes Mother    Diabetes Daughter    Diabetes Son    Social History   Occupational History   Not on file  Tobacco Use   Smoking status: Never   Smokeless tobacco: Never  Substance and Sexual Activity   Alcohol use: No   Drug use: No   Sexual activity: Not on file    Tobacco Counseling Counseling given: Not Answered  SDOH Screenings   Food Insecurity: No Food Insecurity (11/19/2024)  Housing: Low Risk (11/19/2024)  Transportation Needs: No Transportation Needs (11/19/2024)  Utilities: Not At Risk (11/19/2024)  Alcohol Screen: Low Risk (11/14/2023)  Depression (PHQ2-9): Low Risk (11/19/2024)  Financial Resource Strain: Low Risk (11/14/2023)  Physical Activity: Inactive (11/19/2024)  Social Connections: Socially Isolated (11/19/2024)  Stress: No Stress Concern Present (11/19/2024)  Tobacco Use: Low Risk (11/19/2024)  Health Literacy: Adequate Health Literacy (11/19/2024)   See flowsheets for full screening details  Depression Screen PHQ 2 & 9 Depression Scale- Over the past 2 weeks, how often have you been bothered by any of the following problems? Little interest or pleasure in doing things: 0 Feeling down, depressed, or hopeless (PHQ Adolescent also includes...irritable): 0 PHQ-2 Total Score: 0     Goals Addressed             This Visit's Progress    Maintain health and independence   On track            Objective:    Today's Vitals   11/19/24 1010  Weight: 150 lb (68 kg)  Height: 5' 3 (1.6 m)   Body mass index is 26.57 kg/m.  Hearing/Vision screen Hearing Screening - Comments:: Patient is able to hear conversational tones without difficulty. No issues reported.   Vision Screening - Comments:: Wears rx glasses - up to date with routine eye exams with [facility] (whoever has availability)  Immunizations and Health Maintenance Health Maintenance  Topic Date Due   Zoster Vaccines- Shingrix (1 of 2) Never done   COVID-19 Vaccine (3 - Pfizer risk series) 01/31/2020   OPHTHALMOLOGY EXAM  04/04/2023   HEMOGLOBIN A1C  10/12/2024   Diabetic kidney evaluation - Urine ACR  10/28/2024   Diabetic kidney evaluation - eGFR measurement  04/12/2025   FOOT EXAM  08/26/2025   Medicare Annual Wellness (AWV)  11/19/2025    DTaP/Tdap/Td (2 - Td or Tdap) 06/21/2027   Pneumococcal Vaccine: 50+ Years  Completed   Influenza Vaccine  Completed   Bone Density Scan  Completed   Meningococcal B Vaccine  Aged Out        Assessment/Plan:  This is a routine wellness examination for [facility]  Patient Care Team: Alphonsa Glendia DELENA, MD as PCP - General (Family Medicine) Sor Debby DELENA, MD (Inactive) as PCP - Cardiology (Cardiology) Vicci Mcardle, [place] (Optometry) Matilda Senior, MD as Consulting Physician (Urology) Joshua Sieving, MD as Consulting Physician (Dermatology)  I have personally reviewed and noted the following in the patients chart:   Medical and social history Use of alcohol, tobacco or illicit drugs  Current medications and supplements including opioid prescriptions. Functional ability and status Nutritional status Physical activity Advanced directives List of other physicians Hospitalizations, surgeries, and ER visits in previous 12 months Vitals Screenings to include cognitive, depression, and falls Referrals and appointments  No orders of the defined types  were placed in this encounter.  In addition, I have reviewed and discussed with patient certain preventive protocols, quality metrics, and best practice recommendations. A written personalized care plan for preventive services as well as general preventive health recommendations were provided to patient.   Lavelle Charmaine Browner, LPN   8/[id number]/7973   Return in 1 year (on 11/19/2025).  After Visit Summary: (Mail) Due to this being a telephonic visit, the after visit summary with patients personalized plan was offered to patient via mail   Nurse Notes: Appointment(s) made: (03/01/25 @ 1:30 for 6 month follow up) HM Addressed: Records requested for last diabetic eye exam   "

## 2024-11-19 NOTE — Patient Instructions (Signed)
 Ms. Tomaselli,  Thank you for taking the time for your Medicare Wellness Visit. I appreciate your continued commitment to your health goals. Please review the care plan we discussed, and feel free to reach out if I can assist you further.  Please note that Annual Wellness Visits do not include a physical exam. Some assessments may be limited, especially if the visit was conducted virtually. If needed, we may recommend an in-person follow-up with your provider.  Ongoing Care Seeing your primary care provider every 3 to 6 months helps us  monitor your health and provide consistent, personalized care.   Referrals If a referral was made during today's visit and you haven't received any updates within two weeks, please contact the referred provider directly to check on the status.  Recommended Screenings:  Health Maintenance  Topic Date Due   Zoster (Shingles) Vaccine (1 of 2) Never done   COVID-19 Vaccine (3 - Pfizer risk series) 01/31/2020   Eye exam for diabetics  04/04/2023   Hemoglobin A1C  10/12/2024   Yearly kidney health urinalysis for diabetes  10/28/2024   Yearly kidney function blood test for diabetes  04/12/2025   Complete foot exam   08/26/2025   Medicare Annual Wellness Visit  11/19/2025   DTaP/Tdap/Td vaccine (2 - Td or Tdap) 06/21/2027   Pneumococcal Vaccine for age over 65  Completed   Flu Shot  Completed   Osteoporosis screening with Bone Density Scan  Completed   Meningitis B Vaccine  Aged Out       11/19/2024   11:08 AM  Advanced Directives  Does Patient Have a Medical Advance Directive? No  Would patient like information on creating a medical advance directive? Yes (MAU/Ambulatory/Procedural Areas - Information given)   Information on Advanced Care Planning can be found at [place]  Secretary of [facility] Advance Health Care Directives Advance Health Care Directives ([email or web address]   Vision: Annual vision screenings are recommended for early detection of glaucoma,  cataracts, and diabetic retinopathy. These exams can also reveal signs of chronic conditions such as diabetes and high blood pressure.  Dental: Annual dental screenings help detect early signs of oral cancer, gum disease, and other conditions linked to overall health, including heart disease and diabetes.  Please see the attached documents for additional preventive care recommendations.

## 2024-12-02 ENCOUNTER — Ambulatory Visit: Payer: Self-pay | Admitting: Family Medicine

## 2024-12-02 LAB — MICROALBUMIN / CREATININE URINE RATIO
Creatinine, Urine: 44.7 mg/dL
Microalb/Creat Ratio: 17 mg/g{creat} (ref 0–29)
Microalbumin, Urine: 7.8 ug/mL

## 2024-12-02 LAB — BASIC METABOLIC PANEL WITH GFR
BUN/Creatinine Ratio: 12 (ref 12–28)
BUN: 13 mg/dL (ref 8–27)
CO2: 24 mmol/L (ref 20–29)
Calcium: 9.4 mg/dL (ref 8.7–10.3)
Chloride: 102 mmol/L (ref 96–106)
Creatinine, Ser: 1.12 mg/dL — ABNORMAL HIGH (ref 0.57–1.00)
Glucose: 188 mg/dL — ABNORMAL HIGH (ref 70–99)
Potassium: 4.8 mmol/L (ref 3.5–5.2)
Sodium: 140 mmol/L (ref 134–144)
eGFR: 48 mL/min/1.73 — ABNORMAL LOW

## 2024-12-02 LAB — HEPATIC FUNCTION PANEL
ALT: 13 IU/L (ref 0–32)
AST: 12 IU/L (ref 0–40)
Albumin: 4 g/dL (ref 3.7–4.7)
Alkaline Phosphatase: 173 IU/L — ABNORMAL HIGH (ref 48–129)
Bilirubin Total: 0.4 mg/dL (ref 0.0–1.2)
Bilirubin, Direct: 0.13 mg/dL (ref 0.00–0.40)
Total Protein: 6.7 g/dL (ref 6.0–8.5)

## 2024-12-02 LAB — LIPID PANEL
Chol/HDL Ratio: 3.5 ratio (ref 0.0–4.4)
Cholesterol, Total: 177 mg/dL (ref 100–199)
HDL: 51 mg/dL
LDL Chol Calc (NIH): 102 mg/dL — ABNORMAL HIGH (ref 0–99)
Triglycerides: 138 mg/dL (ref 0–149)
VLDL Cholesterol Cal: 24 mg/dL (ref 5–40)

## 2024-12-02 LAB — HEMOGLOBIN A1C
Est. average glucose Bld gHb Est-mCnc: 154 mg/dL
Hgb A1c MFr Bld: 7 % — ABNORMAL HIGH (ref 4.8–5.6)

## 2025-02-09 ENCOUNTER — Ambulatory Visit: Admitting: Internal Medicine

## 2025-02-25 ENCOUNTER — Ambulatory Visit: Admitting: Internal Medicine

## 2025-03-01 ENCOUNTER — Ambulatory Visit: Admitting: Family Medicine
